# Patient Record
Sex: Male | Born: 1940
Health system: Southern US, Community
[De-identification: ages and names within clinical notes are randomized; demographics above are authoritative.]

## PROBLEM LIST (undated history)

## (undated) DIAGNOSIS — I219 Acute myocardial infarction, unspecified: Secondary | ICD-10-CM

## (undated) DIAGNOSIS — K219 Gastro-esophageal reflux disease without esophagitis: Secondary | ICD-10-CM

## (undated) DIAGNOSIS — G4730 Sleep apnea, unspecified: Secondary | ICD-10-CM

## (undated) DIAGNOSIS — I639 Cerebral infarction, unspecified: Secondary | ICD-10-CM

## (undated) DIAGNOSIS — C49A0 Gastrointestinal stromal tumor, unspecified site: Secondary | ICD-10-CM

## (undated) DIAGNOSIS — G4733 Obstructive sleep apnea (adult) (pediatric): Secondary | ICD-10-CM

## (undated) DIAGNOSIS — N400 Enlarged prostate without lower urinary tract symptoms: Secondary | ICD-10-CM

## (undated) DIAGNOSIS — I10 Essential (primary) hypertension: Secondary | ICD-10-CM

## (undated) DIAGNOSIS — M1990 Unspecified osteoarthritis, unspecified site: Secondary | ICD-10-CM

## (undated) DIAGNOSIS — R0902 Hypoxemia: Secondary | ICD-10-CM

## (undated) DIAGNOSIS — C61 Malignant neoplasm of prostate: Secondary | ICD-10-CM

## (undated) DIAGNOSIS — IMO0002 Reserved for concepts with insufficient information to code with codable children: Secondary | ICD-10-CM

## (undated) DIAGNOSIS — H409 Unspecified glaucoma: Secondary | ICD-10-CM

## (undated) DIAGNOSIS — N289 Disorder of kidney and ureter, unspecified: Secondary | ICD-10-CM

## (undated) DIAGNOSIS — H269 Unspecified cataract: Secondary | ICD-10-CM

## (undated) DIAGNOSIS — D649 Anemia, unspecified: Secondary | ICD-10-CM

## (undated) DIAGNOSIS — I2510 Atherosclerotic heart disease of native coronary artery without angina pectoris: Secondary | ICD-10-CM

## (undated) DIAGNOSIS — R011 Cardiac murmur, unspecified: Secondary | ICD-10-CM

## (undated) DIAGNOSIS — T7840XA Allergy, unspecified, initial encounter: Secondary | ICD-10-CM

## (undated) HISTORY — DX: Cardiac murmur, unspecified: R01.1

## (undated) HISTORY — PX: KNEE SURGERY: SHX244

## (undated) HISTORY — DX: Gastro-esophageal reflux disease without esophagitis: K21.9

## (undated) HISTORY — DX: Allergy, unspecified, initial encounter: T78.40XA

## (undated) HISTORY — DX: Obstructive sleep apnea (adult) (pediatric): G47.33

## (undated) HISTORY — DX: Anemia, unspecified: D64.9

## (undated) HISTORY — DX: Reserved for concepts with insufficient information to code with codable children: IMO0002

## (undated) HISTORY — DX: Sleep apnea, unspecified: G47.30

## (undated) HISTORY — DX: Cerebral infarction, unspecified: I63.9

## (undated) HISTORY — DX: Unspecified cataract: H26.9

## (undated) HISTORY — PX: COLON SURGERY: SHX602

## (undated) HISTORY — DX: Hypoxemia: R09.02

## (undated) HISTORY — PX: EYE SURGERY: SHX253

## (undated) HISTORY — PX: HERNIA REPAIR: SHX51

## (undated) HISTORY — DX: Acute myocardial infarction, unspecified: I21.9

## (undated) HISTORY — PX: ABDOMINAL SURGERY: SHX537

## (undated) HISTORY — DX: Unspecified osteoarthritis, unspecified site: M19.90

---

## 1999-10-02 ENCOUNTER — Other Ambulatory Visit: Admission: RE | Admit: 1999-10-02 | Discharge: 1999-10-02 | Payer: Self-pay | Admitting: Urology

## 1999-10-16 ENCOUNTER — Ambulatory Visit (HOSPITAL_BASED_OUTPATIENT_CLINIC_OR_DEPARTMENT_OTHER): Admission: RE | Admit: 1999-10-16 | Discharge: 1999-10-16 | Payer: Self-pay | Admitting: *Deleted

## 2001-11-08 ENCOUNTER — Ambulatory Visit (HOSPITAL_COMMUNITY): Admission: RE | Admit: 2001-11-08 | Discharge: 2001-11-08 | Payer: Self-pay | Admitting: Cardiology

## 2002-01-07 ENCOUNTER — Inpatient Hospital Stay (HOSPITAL_COMMUNITY): Admission: EM | Admit: 2002-01-07 | Discharge: 2002-01-13 | Payer: Self-pay | Admitting: Emergency Medicine

## 2002-01-07 ENCOUNTER — Encounter: Payer: Self-pay | Admitting: Neurology

## 2002-01-08 ENCOUNTER — Encounter: Payer: Self-pay | Admitting: Neurology

## 2002-01-09 ENCOUNTER — Encounter: Payer: Self-pay | Admitting: Neurology

## 2002-01-10 ENCOUNTER — Encounter: Payer: Self-pay | Admitting: Neurology

## 2002-01-17 ENCOUNTER — Encounter: Admission: RE | Admit: 2002-01-17 | Discharge: 2002-03-22 | Payer: Self-pay | Admitting: Neurology

## 2002-02-01 ENCOUNTER — Encounter: Payer: Self-pay | Admitting: Cardiology

## 2002-02-01 ENCOUNTER — Ambulatory Visit (HOSPITAL_COMMUNITY): Admission: RE | Admit: 2002-02-01 | Discharge: 2002-02-01 | Payer: Self-pay | Admitting: Cardiology

## 2002-02-08 ENCOUNTER — Encounter: Payer: Self-pay | Admitting: Neurology

## 2002-02-08 ENCOUNTER — Ambulatory Visit (HOSPITAL_COMMUNITY): Admission: RE | Admit: 2002-02-08 | Discharge: 2002-02-08 | Payer: Self-pay | Admitting: Neurology

## 2002-03-28 ENCOUNTER — Ambulatory Visit (HOSPITAL_COMMUNITY): Admission: RE | Admit: 2002-03-28 | Discharge: 2002-03-28 | Payer: Self-pay | Admitting: Neurology

## 2002-03-28 ENCOUNTER — Encounter: Payer: Self-pay | Admitting: Neurology

## 2003-03-11 ENCOUNTER — Encounter: Payer: Self-pay | Admitting: Emergency Medicine

## 2003-03-11 ENCOUNTER — Emergency Department (HOSPITAL_COMMUNITY): Admission: EM | Admit: 2003-03-11 | Discharge: 2003-03-12 | Payer: Self-pay

## 2003-03-30 ENCOUNTER — Ambulatory Visit (HOSPITAL_COMMUNITY): Admission: RE | Admit: 2003-03-30 | Discharge: 2003-03-30 | Payer: Self-pay | Admitting: Internal Medicine

## 2005-02-09 ENCOUNTER — Ambulatory Visit (HOSPITAL_COMMUNITY): Admission: RE | Admit: 2005-02-09 | Discharge: 2005-02-09 | Payer: Self-pay | Admitting: Surgery

## 2005-02-09 ENCOUNTER — Ambulatory Visit (HOSPITAL_BASED_OUTPATIENT_CLINIC_OR_DEPARTMENT_OTHER): Admission: RE | Admit: 2005-02-09 | Discharge: 2005-02-09 | Payer: Self-pay | Admitting: Surgery

## 2005-12-04 ENCOUNTER — Encounter: Admission: RE | Admit: 2005-12-04 | Discharge: 2005-12-04 | Payer: Self-pay | Admitting: Internal Medicine

## 2006-08-29 ENCOUNTER — Emergency Department (HOSPITAL_COMMUNITY): Admission: EM | Admit: 2006-08-29 | Discharge: 2006-08-29 | Payer: Self-pay | Admitting: Emergency Medicine

## 2006-09-01 ENCOUNTER — Emergency Department (HOSPITAL_COMMUNITY): Admission: EM | Admit: 2006-09-01 | Discharge: 2006-09-01 | Payer: Self-pay | Admitting: Family Medicine

## 2007-04-04 ENCOUNTER — Encounter: Admission: RE | Admit: 2007-04-04 | Discharge: 2007-04-04 | Payer: Self-pay | Admitting: Surgery

## 2007-05-16 ENCOUNTER — Encounter (INDEPENDENT_AMBULATORY_CARE_PROVIDER_SITE_OTHER): Payer: Self-pay | Admitting: Surgery

## 2007-05-16 ENCOUNTER — Inpatient Hospital Stay (HOSPITAL_COMMUNITY): Admission: RE | Admit: 2007-05-16 | Discharge: 2007-05-21 | Payer: Self-pay | Admitting: Surgery

## 2007-05-30 ENCOUNTER — Ambulatory Visit: Payer: Self-pay | Admitting: Oncology

## 2007-06-03 ENCOUNTER — Encounter: Admission: RE | Admit: 2007-06-03 | Discharge: 2007-06-03 | Payer: Self-pay | Admitting: Family Medicine

## 2007-06-20 LAB — COMPREHENSIVE METABOLIC PANEL
ALT: 10 U/L (ref 0–53)
AST: 13 U/L (ref 0–37)
Albumin: 4.4 g/dL (ref 3.5–5.2)
BUN: 38 mg/dL — ABNORMAL HIGH (ref 6–23)
CO2: 23 mEq/L (ref 19–32)
Calcium: 9.6 mg/dL (ref 8.4–10.5)
Chloride: 104 mEq/L (ref 96–112)
Potassium: 4.8 mEq/L (ref 3.5–5.3)

## 2007-06-20 LAB — CBC WITH DIFFERENTIAL/PLATELET
BASO%: 0.8 % (ref 0.0–2.0)
Basophils Absolute: 0.1 10*3/uL (ref 0.0–0.1)
EOS%: 1.7 % (ref 0.0–7.0)
HCT: 30.1 % — ABNORMAL LOW (ref 38.7–49.9)
HGB: 9.9 g/dL — ABNORMAL LOW (ref 13.0–17.1)
MCH: 27.4 pg — ABNORMAL LOW (ref 28.0–33.4)
MONO#: 0.3 10*3/uL (ref 0.1–0.9)
RDW: 14.8 % — ABNORMAL HIGH (ref 11.2–14.6)
WBC: 6.2 10*3/uL (ref 4.0–10.0)
lymph#: 1.1 10*3/uL (ref 0.9–3.3)

## 2007-06-20 LAB — LACTATE DEHYDROGENASE: LDH: 153 U/L (ref 94–250)

## 2007-06-24 ENCOUNTER — Ambulatory Visit (HOSPITAL_COMMUNITY): Admission: RE | Admit: 2007-06-24 | Discharge: 2007-06-24 | Payer: Self-pay | Admitting: Oncology

## 2007-07-14 ENCOUNTER — Ambulatory Visit: Payer: Self-pay | Admitting: Oncology

## 2007-07-18 LAB — CBC WITH DIFFERENTIAL/PLATELET
Basophils Absolute: 0 10*3/uL (ref 0.0–0.1)
Eosinophils Absolute: 0.1 10*3/uL (ref 0.0–0.5)
HCT: 30.6 % — ABNORMAL LOW (ref 38.7–49.9)
HGB: 10.4 g/dL — ABNORMAL LOW (ref 13.0–17.1)
MCH: 28.1 pg (ref 28.0–33.4)
MCV: 82.6 fL (ref 81.6–98.0)
MONO%: 5.3 % (ref 0.0–13.0)
NEUT#: 4.4 10*3/uL (ref 1.5–6.5)
NEUT%: 68.3 % (ref 40.0–75.0)
RDW: 14.3 % (ref 11.2–14.6)

## 2007-07-18 LAB — COMPREHENSIVE METABOLIC PANEL
Albumin: 4.1 g/dL (ref 3.5–5.2)
Alkaline Phosphatase: 53 U/L (ref 39–117)
BUN: 24 mg/dL — ABNORMAL HIGH (ref 6–23)
Calcium: 9.2 mg/dL (ref 8.4–10.5)
Chloride: 103 mEq/L (ref 96–112)
Creatinine, Ser: 1.86 mg/dL — ABNORMAL HIGH (ref 0.40–1.50)
Glucose, Bld: 98 mg/dL (ref 70–99)
Potassium: 4.3 mEq/L (ref 3.5–5.3)

## 2007-07-21 ENCOUNTER — Ambulatory Visit (HOSPITAL_COMMUNITY): Admission: RE | Admit: 2007-07-21 | Discharge: 2007-07-21 | Payer: Self-pay | Admitting: Oncology

## 2007-08-15 LAB — CBC WITH DIFFERENTIAL/PLATELET
Basophils Absolute: 0 10*3/uL (ref 0.0–0.1)
EOS%: 1.4 % (ref 0.0–7.0)
Eosinophils Absolute: 0.1 10*3/uL (ref 0.0–0.5)
HGB: 10.8 g/dL — ABNORMAL LOW (ref 13.0–17.1)
LYMPH%: 18.8 % (ref 14.0–48.0)
MCH: 28 pg (ref 28.0–33.4)
MCV: 82.6 fL (ref 81.6–98.0)
MONO%: 6.5 % (ref 0.0–13.0)
NEUT#: 3.3 10*3/uL (ref 1.5–6.5)
NEUT%: 73.2 % (ref 40.0–75.0)
Platelets: 186 10*3/uL (ref 145–400)
RDW: 15.4 % — ABNORMAL HIGH (ref 11.2–14.6)

## 2007-08-15 LAB — COMPREHENSIVE METABOLIC PANEL
AST: 26 U/L (ref 0–37)
Albumin: 4.4 g/dL (ref 3.5–5.2)
Alkaline Phosphatase: 54 U/L (ref 39–117)
BUN: 19 mg/dL (ref 6–23)
Creatinine, Ser: 1.79 mg/dL — ABNORMAL HIGH (ref 0.40–1.50)
Glucose, Bld: 105 mg/dL — ABNORMAL HIGH (ref 70–99)
Potassium: 3.8 mEq/L (ref 3.5–5.3)
Total Bilirubin: 0.6 mg/dL (ref 0.3–1.2)

## 2007-09-08 ENCOUNTER — Ambulatory Visit: Payer: Self-pay | Admitting: Oncology

## 2007-09-12 LAB — COMPREHENSIVE METABOLIC PANEL
AST: 19 U/L (ref 0–37)
Albumin: 4.3 g/dL (ref 3.5–5.2)
Alkaline Phosphatase: 45 U/L (ref 39–117)
BUN: 29 mg/dL — ABNORMAL HIGH (ref 6–23)
Glucose, Bld: 120 mg/dL — ABNORMAL HIGH (ref 70–99)
Potassium: 3.7 mEq/L (ref 3.5–5.3)
Sodium: 140 mEq/L (ref 135–145)
Total Bilirubin: 0.7 mg/dL (ref 0.3–1.2)

## 2007-09-12 LAB — CBC WITH DIFFERENTIAL/PLATELET
BASO%: 0.7 % (ref 0.0–2.0)
EOS%: 3.9 % (ref 0.0–7.0)
MCH: 27.8 pg — ABNORMAL LOW (ref 28.0–33.4)
MCV: 81.6 fL (ref 81.6–98.0)
MONO%: 6.3 % (ref 0.0–13.0)
RBC: 3.82 10*6/uL — ABNORMAL LOW (ref 4.20–5.71)
RDW: 15.8 % — ABNORMAL HIGH (ref 11.2–14.6)
lymph#: 1.1 10*3/uL (ref 0.9–3.3)

## 2007-11-03 ENCOUNTER — Ambulatory Visit: Payer: Self-pay | Admitting: Oncology

## 2007-11-08 ENCOUNTER — Ambulatory Visit (HOSPITAL_COMMUNITY): Admission: RE | Admit: 2007-11-08 | Discharge: 2007-11-08 | Payer: Self-pay | Admitting: Oncology

## 2007-11-10 LAB — COMPREHENSIVE METABOLIC PANEL
AST: 21 U/L (ref 0–37)
Albumin: 4.2 g/dL (ref 3.5–5.2)
Alkaline Phosphatase: 45 U/L (ref 39–117)
BUN: 16 mg/dL (ref 6–23)
Calcium: 9.5 mg/dL (ref 8.4–10.5)
Chloride: 104 mEq/L (ref 96–112)
Glucose, Bld: 91 mg/dL (ref 70–99)
Potassium: 4.3 mEq/L (ref 3.5–5.3)
Sodium: 140 mEq/L (ref 135–145)
Total Protein: 7.3 g/dL (ref 6.0–8.3)

## 2007-11-10 LAB — CBC WITH DIFFERENTIAL/PLATELET
Basophils Absolute: 0 10*3/uL (ref 0.0–0.1)
EOS%: 1.3 % (ref 0.0–7.0)
Eosinophils Absolute: 0.1 10*3/uL (ref 0.0–0.5)
HGB: 10.4 g/dL — ABNORMAL LOW (ref 13.0–17.1)
MCV: 86.1 fL (ref 81.6–98.0)
MONO%: 6.7 % (ref 0.0–13.0)
NEUT#: 2.8 10*3/uL (ref 1.5–6.5)
RBC: 3.57 10*6/uL — ABNORMAL LOW (ref 4.20–5.71)
RDW: 14.5 % (ref 11.2–14.6)
lymph#: 1.1 10*3/uL (ref 0.9–3.3)

## 2007-12-08 LAB — CBC WITH DIFFERENTIAL/PLATELET
Basophils Absolute: 0 10*3/uL (ref 0.0–0.1)
Eosinophils Absolute: 0.2 10*3/uL (ref 0.0–0.5)
HGB: 10.7 g/dL — ABNORMAL LOW (ref 13.0–17.1)
MCV: 85 fL (ref 81.6–98.0)
MONO#: 0.3 10*3/uL (ref 0.1–0.9)
MONO%: 6.4 % (ref 0.0–13.0)
NEUT#: 3.1 10*3/uL (ref 1.5–6.5)
RBC: 3.76 10*6/uL — ABNORMAL LOW (ref 4.20–5.71)
RDW: 13.6 % (ref 11.2–14.6)
WBC: 4.7 10*3/uL (ref 4.0–10.0)
lymph#: 1.1 10*3/uL (ref 0.9–3.3)

## 2007-12-08 LAB — COMPREHENSIVE METABOLIC PANEL
Albumin: 4 g/dL (ref 3.5–5.2)
BUN: 22 mg/dL (ref 6–23)
CO2: 29 mEq/L (ref 19–32)
Calcium: 8.7 mg/dL (ref 8.4–10.5)
Chloride: 105 mEq/L (ref 96–112)
Glucose, Bld: 95 mg/dL (ref 70–99)
Potassium: 4.1 mEq/L (ref 3.5–5.3)
Sodium: 141 mEq/L (ref 135–145)
Total Protein: 6.8 g/dL (ref 6.0–8.3)

## 2008-01-03 ENCOUNTER — Ambulatory Visit: Payer: Self-pay | Admitting: Oncology

## 2008-02-02 LAB — CBC WITH DIFFERENTIAL/PLATELET
Basophils Absolute: 0 10*3/uL (ref 0.0–0.1)
Eosinophils Absolute: 0.2 10*3/uL (ref 0.0–0.5)
HGB: 12.1 g/dL — ABNORMAL LOW (ref 13.0–17.1)
LYMPH%: 25.8 % (ref 14.0–48.0)
MCV: 83.9 fL (ref 81.6–98.0)
MONO#: 0.4 10*3/uL (ref 0.1–0.9)
MONO%: 7.2 % (ref 0.0–13.0)
Platelets: 165 10*3/uL (ref 145–400)
RDW: 13.7 % (ref 11.2–14.6)
WBC: 5.1 10*3/uL (ref 4.0–10.0)

## 2008-02-02 LAB — COMPREHENSIVE METABOLIC PANEL
Albumin: 4.2 g/dL (ref 3.5–5.2)
Alkaline Phosphatase: 46 U/L (ref 39–117)
BUN: 20 mg/dL (ref 6–23)
CO2: 29 mEq/L (ref 19–32)
Glucose, Bld: 94 mg/dL (ref 70–99)
Potassium: 3.7 mEq/L (ref 3.5–5.3)

## 2008-03-06 ENCOUNTER — Ambulatory Visit: Payer: Self-pay | Admitting: Oncology

## 2008-03-08 ENCOUNTER — Ambulatory Visit (HOSPITAL_COMMUNITY): Admission: RE | Admit: 2008-03-08 | Discharge: 2008-03-08 | Payer: Self-pay | Admitting: Oncology

## 2008-03-08 LAB — CBC WITH DIFFERENTIAL/PLATELET
BASO%: 0.3 % (ref 0.0–2.0)
EOS%: 3 % (ref 0.0–7.0)
HCT: 36.5 % — ABNORMAL LOW (ref 38.7–49.9)
MCH: 27.6 pg — ABNORMAL LOW (ref 28.0–33.4)
MCHC: 33.5 g/dL (ref 32.0–35.9)
MONO#: 0.4 10*3/uL (ref 0.1–0.9)
RBC: 4.42 10*6/uL (ref 4.20–5.71)
RDW: 15 % — ABNORMAL HIGH (ref 11.2–14.6)
WBC: 4.8 10*3/uL (ref 4.0–10.0)
lymph#: 1.3 10*3/uL (ref 0.9–3.3)

## 2008-03-08 LAB — COMPREHENSIVE METABOLIC PANEL
ALT: 15 U/L (ref 0–53)
AST: 24 U/L (ref 0–37)
Albumin: 4.6 g/dL (ref 3.5–5.2)
CO2: 27 mEq/L (ref 19–32)
Calcium: 9.6 mg/dL (ref 8.4–10.5)
Chloride: 103 mEq/L (ref 96–112)
Creatinine, Ser: 1.58 mg/dL — ABNORMAL HIGH (ref 0.40–1.50)
Potassium: 4 mEq/L (ref 3.5–5.3)
Sodium: 140 mEq/L (ref 135–145)
Total Protein: 7.7 g/dL (ref 6.0–8.3)

## 2008-04-04 ENCOUNTER — Encounter: Payer: Self-pay | Admitting: Cardiovascular Disease

## 2008-04-06 ENCOUNTER — Encounter: Payer: Self-pay | Admitting: Cardiovascular Disease

## 2008-04-13 LAB — COMPREHENSIVE METABOLIC PANEL
ALT: 13 U/L (ref 0–53)
CO2: 28 mEq/L (ref 19–32)
Calcium: 9.9 mg/dL (ref 8.4–10.5)
Chloride: 103 mEq/L (ref 96–112)
Creatinine, Ser: 1.69 mg/dL — ABNORMAL HIGH (ref 0.40–1.50)
Glucose, Bld: 99 mg/dL (ref 70–99)
Sodium: 139 mEq/L (ref 135–145)
Total Bilirubin: 0.5 mg/dL (ref 0.3–1.2)
Total Protein: 7 g/dL (ref 6.0–8.3)

## 2008-04-13 LAB — LACTATE DEHYDROGENASE: LDH: 190 U/L (ref 94–250)

## 2008-04-13 LAB — CBC WITH DIFFERENTIAL/PLATELET
BASO%: 0.4 % (ref 0.0–2.0)
Eosinophils Absolute: 0.1 10*3/uL (ref 0.0–0.5)
HCT: 33.2 % — ABNORMAL LOW (ref 38.7–49.9)
LYMPH%: 16.1 % (ref 14.0–48.0)
MCHC: 33.8 g/dL (ref 32.0–35.9)
MONO#: 0.4 10*3/uL (ref 0.1–0.9)
NEUT#: 5.1 10*3/uL (ref 1.5–6.5)
NEUT%: 74.9 % (ref 40.0–75.0)
Platelets: 144 10*3/uL — ABNORMAL LOW (ref 145–400)
WBC: 6.8 10*3/uL (ref 4.0–10.0)
lymph#: 1.1 10*3/uL (ref 0.9–3.3)

## 2008-05-10 ENCOUNTER — Ambulatory Visit: Payer: Self-pay | Admitting: Oncology

## 2008-05-14 LAB — COMPREHENSIVE METABOLIC PANEL
Alkaline Phosphatase: 43 U/L (ref 39–117)
BUN: 21 mg/dL (ref 6–23)
CO2: 28 mEq/L (ref 19–32)
Creatinine, Ser: 1.59 mg/dL — ABNORMAL HIGH (ref 0.40–1.50)
Glucose, Bld: 90 mg/dL (ref 70–99)
Total Bilirubin: 0.7 mg/dL (ref 0.3–1.2)

## 2008-05-14 LAB — CBC WITH DIFFERENTIAL/PLATELET
Basophils Absolute: 0 10*3/uL (ref 0.0–0.1)
Eosinophils Absolute: 0.2 10*3/uL (ref 0.0–0.5)
HCT: 34 % — ABNORMAL LOW (ref 38.7–49.9)
HGB: 11.4 g/dL — ABNORMAL LOW (ref 13.0–17.1)
LYMPH%: 23.2 % (ref 14.0–48.0)
MCV: 84 fL (ref 81.6–98.0)
MONO#: 0.4 10*3/uL (ref 0.1–0.9)
MONO%: 6 % (ref 0.0–13.0)
NEUT#: 4 10*3/uL (ref 1.5–6.5)
NEUT%: 67.9 % (ref 40.0–75.0)
Platelets: 150 10*3/uL (ref 145–400)
RBC: 4.05 10*6/uL — ABNORMAL LOW (ref 4.20–5.71)

## 2008-05-14 LAB — LACTATE DEHYDROGENASE: LDH: 207 U/L (ref 94–250)

## 2008-06-29 ENCOUNTER — Ambulatory Visit: Payer: Self-pay | Admitting: Oncology

## 2008-07-03 LAB — COMPREHENSIVE METABOLIC PANEL
AST: 30 U/L (ref 0–37)
Albumin: 3.7 g/dL (ref 3.5–5.2)
Alkaline Phosphatase: 41 U/L (ref 39–117)
BUN: 19 mg/dL (ref 6–23)
Potassium: 3.6 mEq/L (ref 3.5–5.3)
Sodium: 139 mEq/L (ref 135–145)
Total Bilirubin: 0.4 mg/dL (ref 0.3–1.2)

## 2008-07-03 LAB — CBC WITH DIFFERENTIAL/PLATELET
Basophils Absolute: 0 10*3/uL (ref 0.0–0.1)
EOS%: 2.8 % (ref 0.0–7.0)
LYMPH%: 23.7 % (ref 14.0–48.0)
MCH: 28 pg (ref 28.0–33.4)
MCV: 85.2 fL (ref 81.6–98.0)
MONO%: 6 % (ref 0.0–13.0)
RBC: 4.09 10*6/uL — ABNORMAL LOW (ref 4.20–5.71)
RDW: 15 % — ABNORMAL HIGH (ref 11.2–14.6)

## 2008-07-04 ENCOUNTER — Ambulatory Visit (HOSPITAL_COMMUNITY): Admission: RE | Admit: 2008-07-04 | Discharge: 2008-07-04 | Payer: Self-pay | Admitting: Oncology

## 2008-07-27 ENCOUNTER — Inpatient Hospital Stay (HOSPITAL_BASED_OUTPATIENT_CLINIC_OR_DEPARTMENT_OTHER): Admission: RE | Admit: 2008-07-27 | Discharge: 2008-07-27 | Payer: Self-pay | Admitting: Cardiovascular Disease

## 2008-12-28 ENCOUNTER — Ambulatory Visit: Payer: Self-pay | Admitting: Oncology

## 2009-01-01 ENCOUNTER — Ambulatory Visit (HOSPITAL_COMMUNITY): Admission: RE | Admit: 2009-01-01 | Discharge: 2009-01-01 | Payer: Self-pay | Admitting: Oncology

## 2009-01-01 LAB — COMPREHENSIVE METABOLIC PANEL
ALT: 20 U/L (ref 0–53)
AST: 28 U/L (ref 0–37)
Albumin: 4 g/dL (ref 3.5–5.2)
Calcium: 9.3 mg/dL (ref 8.4–10.5)
Chloride: 103 mEq/L (ref 96–112)
Potassium: 4.2 mEq/L (ref 3.5–5.3)
Sodium: 136 mEq/L (ref 135–145)

## 2009-01-01 LAB — CBC WITH DIFFERENTIAL/PLATELET
BASO%: 0.4 % (ref 0.0–2.0)
EOS%: 1.4 % (ref 0.0–7.0)
HGB: 12.2 g/dL — ABNORMAL LOW (ref 13.0–17.1)
MCH: 27.6 pg (ref 27.2–33.4)
MCHC: 33.3 g/dL (ref 32.0–36.0)
RDW: 14.6 % (ref 11.0–14.6)
lymph#: 1.3 10*3/uL (ref 0.9–3.3)

## 2009-06-28 ENCOUNTER — Ambulatory Visit: Payer: Self-pay | Admitting: Oncology

## 2009-07-02 ENCOUNTER — Ambulatory Visit (HOSPITAL_COMMUNITY): Admission: RE | Admit: 2009-07-02 | Discharge: 2009-07-02 | Payer: Self-pay | Admitting: Oncology

## 2009-07-02 LAB — CBC WITH DIFFERENTIAL/PLATELET
BASO%: 0.5 % (ref 0.0–2.0)
Basophils Absolute: 0 10*3/uL (ref 0.0–0.1)
EOS%: 5 % (ref 0.0–7.0)
HCT: 38.7 % (ref 38.4–49.9)
HGB: 12.8 g/dL — ABNORMAL LOW (ref 13.0–17.1)
LYMPH%: 25.7 % (ref 14.0–49.0)
MCH: 28.1 pg (ref 27.2–33.4)
MCHC: 33 g/dL (ref 32.0–36.0)
NEUT%: 61.9 % (ref 39.0–75.0)
Platelets: 152 10*3/uL (ref 140–400)

## 2009-07-02 LAB — COMPREHENSIVE METABOLIC PANEL
ALT: 22 U/L (ref 0–53)
AST: 32 U/L (ref 0–37)
BUN: 25 mg/dL — ABNORMAL HIGH (ref 6–23)
CO2: 30 mEq/L (ref 19–32)
Calcium: 10 mg/dL (ref 8.4–10.5)
Chloride: 101 mEq/L (ref 96–112)
Creatinine, Ser: 1.52 mg/dL — ABNORMAL HIGH (ref 0.40–1.50)
Total Bilirubin: 0.6 mg/dL (ref 0.3–1.2)

## 2009-07-02 LAB — LACTATE DEHYDROGENASE: LDH: 174 U/L (ref 94–250)

## 2009-07-03 ENCOUNTER — Ambulatory Visit (HOSPITAL_COMMUNITY): Admission: RE | Admit: 2009-07-03 | Discharge: 2009-07-03 | Payer: Self-pay | Admitting: Oncology

## 2009-10-14 IMAGING — RF DG UGI W/ HIGH DENSITY W/KUB
18 series · 18 of 18 positions shown · non-contrast
Comparison: none

CLINICAL DATA: Gastric mass

UPPER GI SERIES W/ HIGH-DENSITY BARIUM AND KUB:
TECHNIQUE: After obtaining a scout radiograph, a double-contrast upper GI
series was performed using both high-density and thin barium.

[Series 1: run · 1 of 1 slices shown (1 of 18)]
[im 1/1]
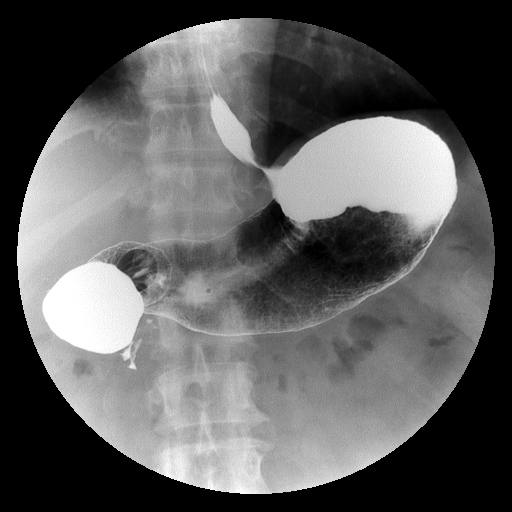

[Series 2: run · 1 of 1 slices shown (2 of 18)]
[im 1/1]
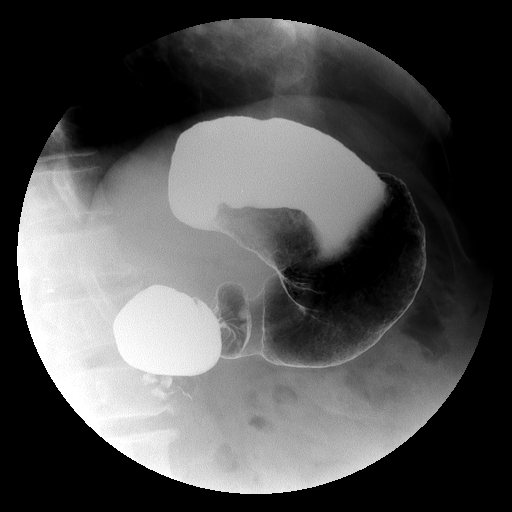

[Series 3: run · 1 of 1 slices shown (3 of 18)]
[im 1/1]
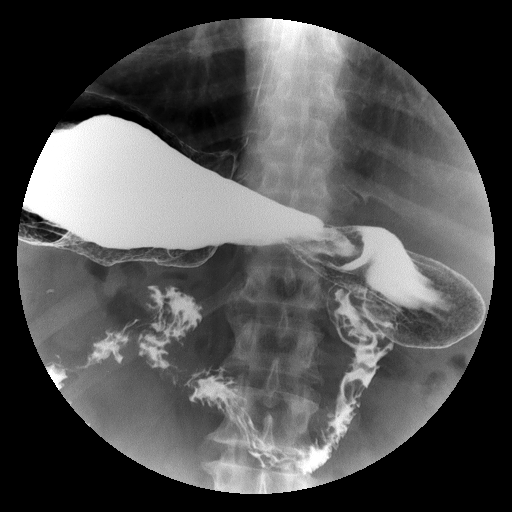

[Series 4: run · 1 of 1 slices shown (4 of 18)]
[im 1/1]
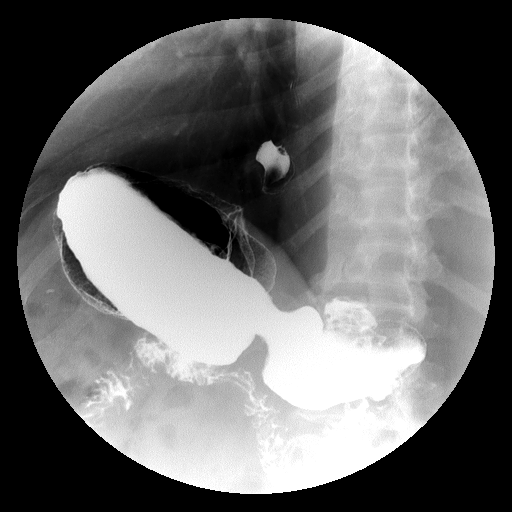

[Series 5: run · 1 of 1 slices shown (5 of 18)]
[im 1/1]
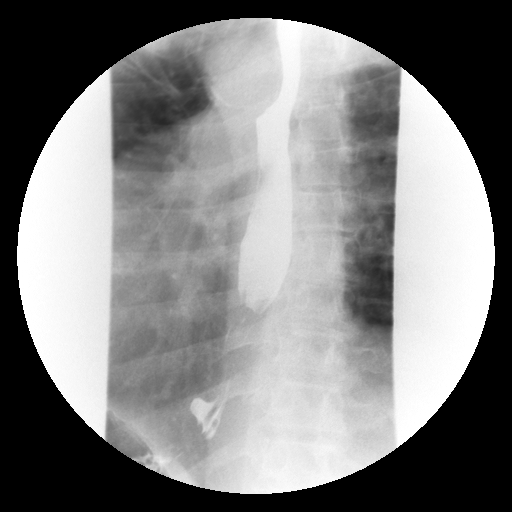

[Series 6: run · 1 of 1 slices shown (6 of 18)]
[im 1/1]
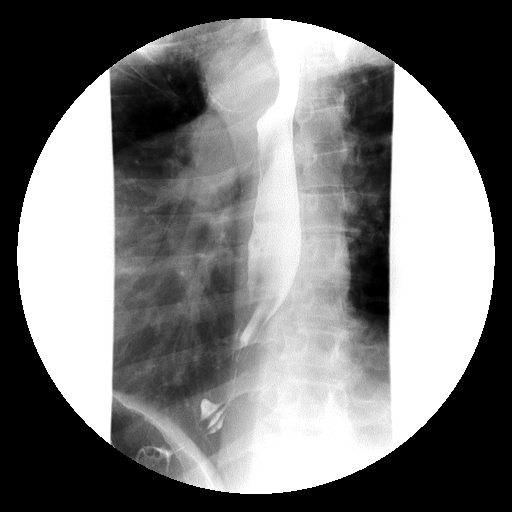

[Series 7: run · 1 of 1 slices shown (7 of 18)]
[im 1/1]
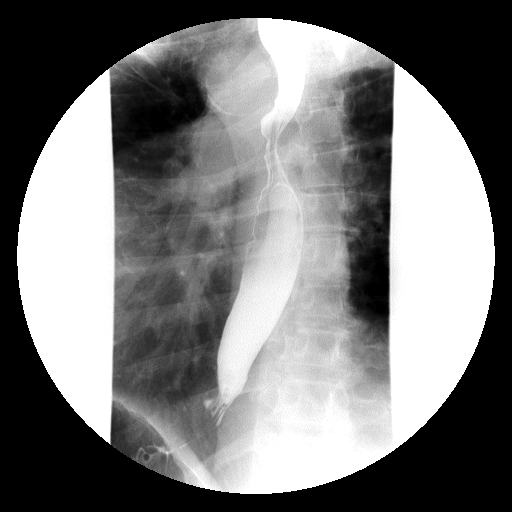

[Series 8: run · 1 of 1 slices shown (8 of 18)]
[im 1/1]
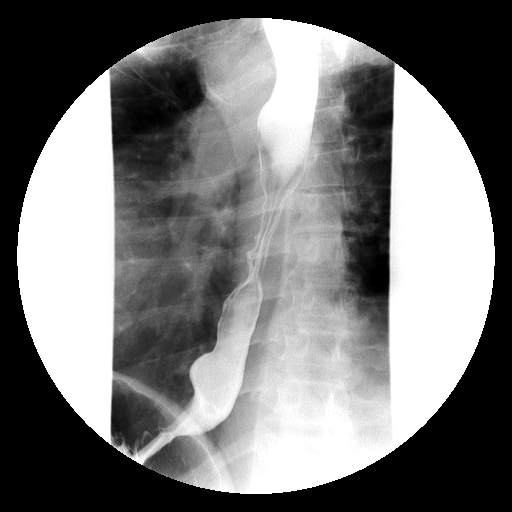

[Series 9: run · 1 of 1 slices shown (9 of 18)]
[im 1/1]
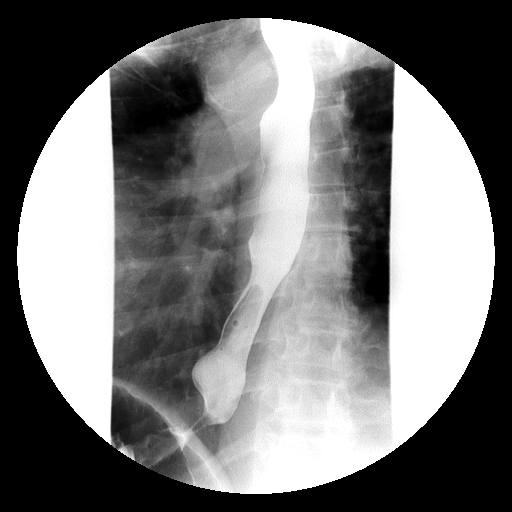

[Series 10: run · 1 of 1 slices shown (10 of 18)]
[im 1/1]
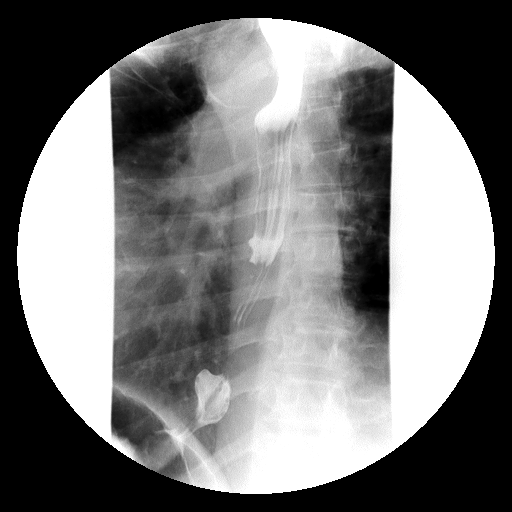

[Series 11: run · 1 of 1 slices shown (11 of 18)]
[im 1/1]
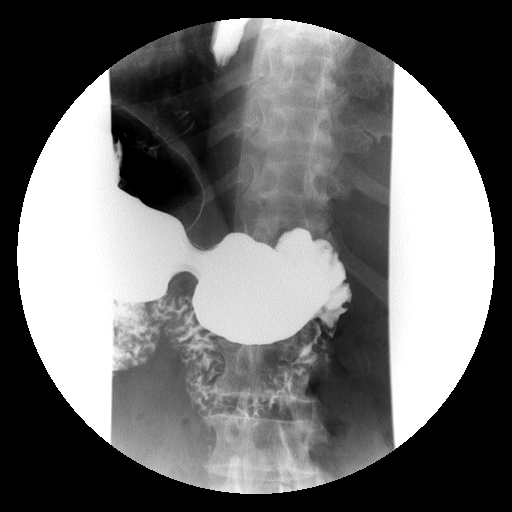

[Series 12: run · 1 of 1 slices shown (12 of 18)]
[im 1/1]
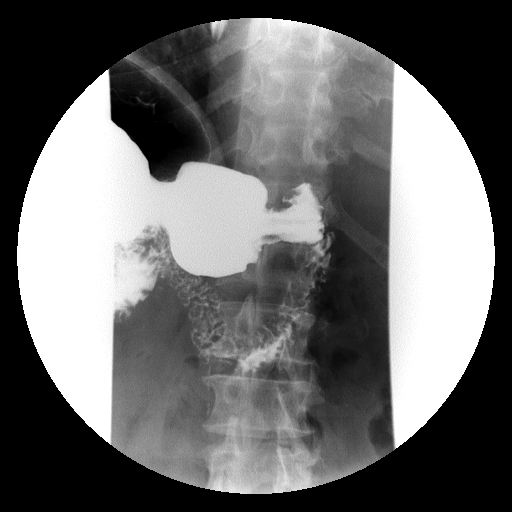

[Series 13: run · 1 of 1 slices shown (13 of 18)]
[im 1/1]
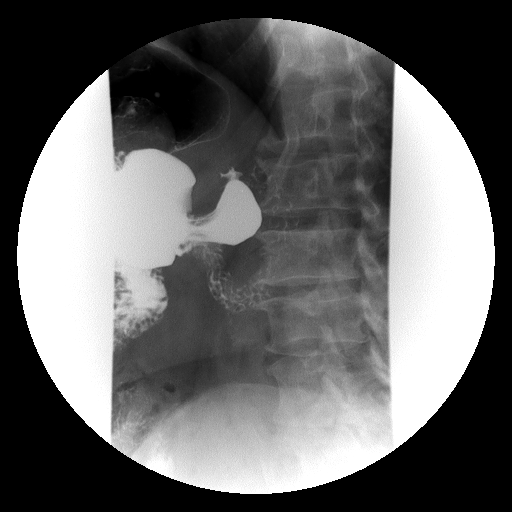

[Series 14: run · 1 of 1 slices shown (14 of 18)]
[im 1/1]
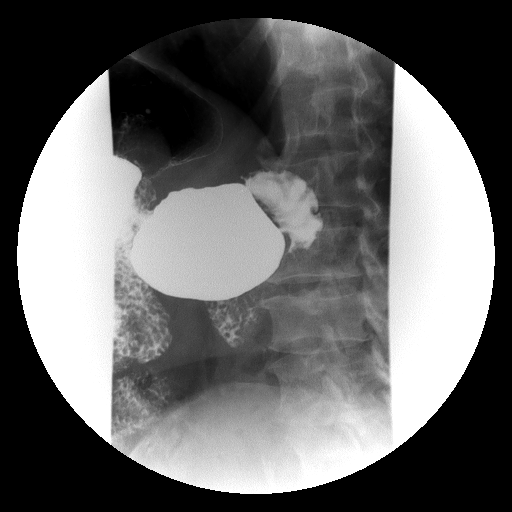

[Series 15: run · 1 of 1 slices shown (15 of 18)]
[im 1/1]
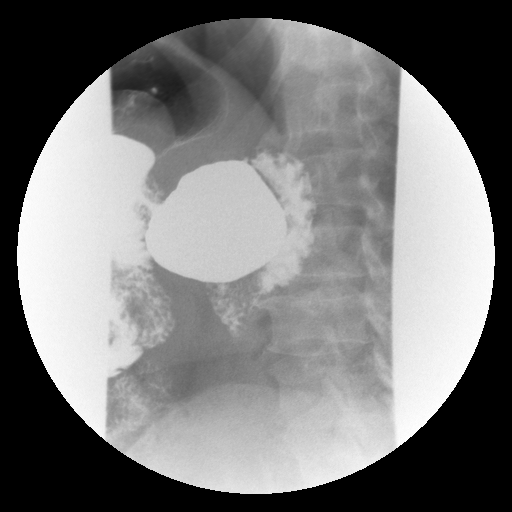

[Series 16: run · 1 of 1 slices shown (16 of 18)]
[im 1/1]
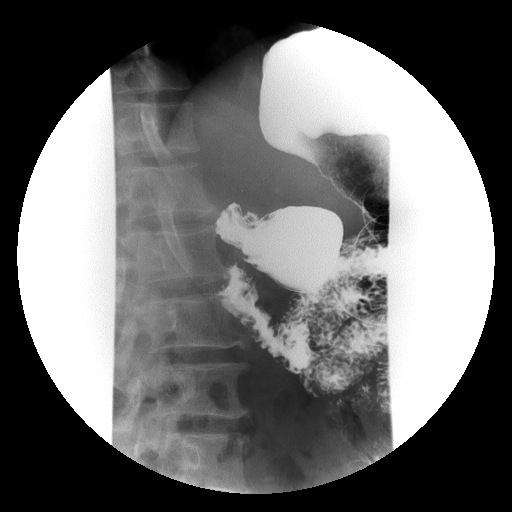

[Series 17: run · 1 of 1 slices shown (17 of 18)]
[im 1/1]
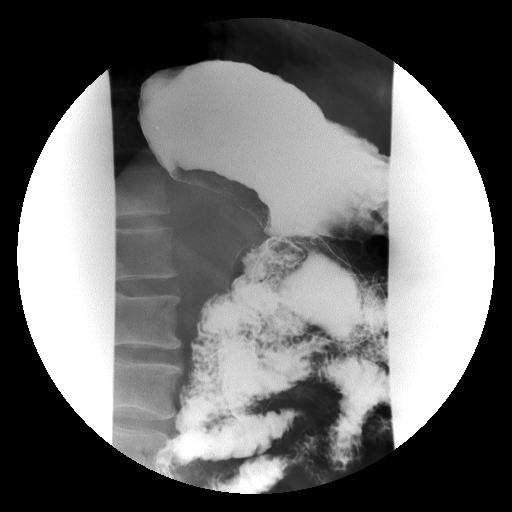

[Series 18: run · 1 of 1 slices shown (18 of 18)]
[im 1/1]
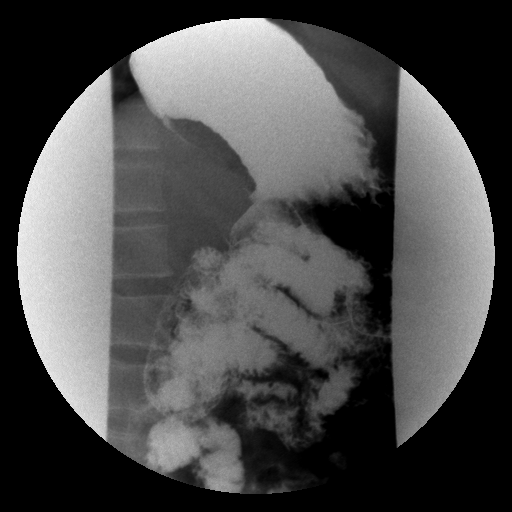

[18 of 18 positions shown; findings below may reference images not displayed]

FINDINGS: Fluoroscopic evaluation of esophageal function is normal. There is
normal peristalsis. No esophageal mass, stricture, or fold thickening.

There is a sessile filling defect noted within the fundus of the stomach, likely
corresponding to large mass seen on recent endoscopy. No additional abnormality
seen within the stomach. Duodenal bulb and duodenal sweep are unremarkable.
IMPRESSION: Large sessile filling defect in the fundus of the stomach, along the lesser
curvature, likely corresponding to mass recently biopsied on endoscopy.

No additional abnormality.

## 2009-12-27 ENCOUNTER — Ambulatory Visit: Payer: Self-pay | Admitting: Oncology

## 2009-12-31 ENCOUNTER — Ambulatory Visit (HOSPITAL_COMMUNITY): Admission: RE | Admit: 2009-12-31 | Discharge: 2009-12-31 | Payer: Self-pay | Admitting: Oncology

## 2009-12-31 LAB — CBC WITH DIFFERENTIAL/PLATELET
BASO%: 0.6 % (ref 0.0–2.0)
EOS%: 1.6 % (ref 0.0–7.0)
LYMPH%: 24.6 % (ref 14.0–49.0)
MCH: 28.1 pg (ref 27.2–33.4)
MCHC: 33.4 g/dL (ref 32.0–36.0)
MCV: 84.1 fL (ref 79.3–98.0)
MONO#: 0.3 10*3/uL (ref 0.1–0.9)
MONO%: 7.5 % (ref 0.0–14.0)
Platelets: 154 10*3/uL (ref 140–400)
RBC: 4.32 10*6/uL (ref 4.20–5.82)
WBC: 4.3 10*3/uL (ref 4.0–10.3)

## 2009-12-31 LAB — COMPREHENSIVE METABOLIC PANEL
ALT: 21 U/L (ref 0–53)
AST: 29 U/L (ref 0–37)
Alkaline Phosphatase: 38 U/L — ABNORMAL LOW (ref 39–117)
Creatinine, Ser: 1.37 mg/dL (ref 0.40–1.50)
Sodium: 141 mEq/L (ref 135–145)
Total Bilirubin: 0.8 mg/dL (ref 0.3–1.2)
Total Protein: 7 g/dL (ref 6.0–8.3)

## 2010-06-13 ENCOUNTER — Other Ambulatory Visit: Payer: Self-pay | Admitting: Oncology

## 2010-06-13 DIAGNOSIS — C49A0 Gastrointestinal stromal tumor, unspecified site: Secondary | ICD-10-CM

## 2010-06-15 ENCOUNTER — Encounter: Payer: Self-pay | Admitting: Surgery

## 2010-06-16 ENCOUNTER — Other Ambulatory Visit: Payer: Self-pay | Admitting: Oncology

## 2010-06-16 DIAGNOSIS — C49A0 Gastrointestinal stromal tumor, unspecified site: Secondary | ICD-10-CM

## 2010-07-01 ENCOUNTER — Encounter (HOSPITAL_COMMUNITY): Payer: Self-pay

## 2010-07-01 ENCOUNTER — Ambulatory Visit (HOSPITAL_COMMUNITY)
Admission: RE | Admit: 2010-07-01 | Discharge: 2010-07-01 | Disposition: A | Discharge status: Home / Self Care | Payer: 59 | Source: Ambulatory Visit | Attending: Oncology | Admitting: Oncology

## 2010-07-01 ENCOUNTER — Other Ambulatory Visit: Payer: Self-pay | Admitting: Oncology

## 2010-07-01 ENCOUNTER — Encounter (HOSPITAL_BASED_OUTPATIENT_CLINIC_OR_DEPARTMENT_OTHER): Payer: 59 | Admitting: Oncology

## 2010-07-01 DIAGNOSIS — C161 Malignant neoplasm of fundus of stomach: Secondary | ICD-10-CM

## 2010-07-01 DIAGNOSIS — C49A0 Gastrointestinal stromal tumor, unspecified site: Secondary | ICD-10-CM

## 2010-07-01 DIAGNOSIS — C169 Malignant neoplasm of stomach, unspecified: Secondary | ICD-10-CM | POA: Insufficient documentation

## 2010-07-01 DIAGNOSIS — Q619 Cystic kidney disease, unspecified: Secondary | ICD-10-CM | POA: Insufficient documentation

## 2010-07-01 DIAGNOSIS — N200 Calculus of kidney: Secondary | ICD-10-CM | POA: Insufficient documentation

## 2010-07-01 DIAGNOSIS — J984 Other disorders of lung: Secondary | ICD-10-CM | POA: Insufficient documentation

## 2010-07-01 HISTORY — DX: Essential (primary) hypertension: I10

## 2010-07-01 HISTORY — DX: Disorder of kidney and ureter, unspecified: N28.9

## 2010-07-01 HISTORY — DX: Gastrointestinal stromal tumor, unspecified site: C49.A0

## 2010-07-01 LAB — CBC WITH DIFFERENTIAL/PLATELET
BASO%: 0.3 % (ref 0.0–2.0)
Basophils Absolute: 0 10*3/uL (ref 0.0–0.1)
EOS%: 2 % (ref 0.0–7.0)
HCT: 39 % (ref 38.4–49.9)
HGB: 12.8 g/dL — ABNORMAL LOW (ref 13.0–17.1)
LYMPH%: 25.7 % (ref 14.0–49.0)
MCH: 27.2 pg (ref 27.2–33.4)
MONO%: 7.7 % (ref 0.0–14.0)
NEUT#: 3.5 10*3/uL (ref 1.5–6.5)
RBC: 4.71 10*6/uL (ref 4.20–5.82)
RDW: 14.4 % (ref 11.0–14.6)

## 2010-07-01 LAB — COMPREHENSIVE METABOLIC PANEL
AST: 27 U/L (ref 0–37)
BUN: 26 mg/dL — ABNORMAL HIGH (ref 6–23)
Calcium: 9.7 mg/dL (ref 8.4–10.5)
Glucose, Bld: 102 mg/dL — ABNORMAL HIGH (ref 70–99)
Potassium: 4.4 mEq/L (ref 3.5–5.3)
Sodium: 140 mEq/L (ref 135–145)

## 2010-07-01 LAB — LACTATE DEHYDROGENASE: LDH: 150 U/L (ref 94–250)

## 2010-07-09 ENCOUNTER — Encounter (HOSPITAL_BASED_OUTPATIENT_CLINIC_OR_DEPARTMENT_OTHER): Payer: 59 | Admitting: Oncology

## 2010-07-09 ENCOUNTER — Other Ambulatory Visit: Payer: Self-pay | Admitting: Oncology

## 2010-07-09 DIAGNOSIS — C161 Malignant neoplasm of fundus of stomach: Secondary | ICD-10-CM

## 2010-07-09 DIAGNOSIS — C499 Malignant neoplasm of connective and soft tissue, unspecified: Secondary | ICD-10-CM

## 2010-10-07 NOTE — Consult Note (Signed)
NAME:  Lawrence Bailey, Lawrence Bailey NO.:  [id number]   MEDICAL RECORD NO.:  [id number]          PATIENT TYPE:  INP   LOCATION:  1336                         FACILITY:  [facility]   PHYSICIAN:  Aram Beecham B. Eliott Nine, M.D.DATE OF BIRTH:  [birth year]   DATE OF CONSULTATION:  DATE OF DISCHARGE:                                 CONSULTATION   We are asked to see this patient because of a rising creatinine  postsurgery.  He is a 70 year old African American gentleman, followed  by Dr. Renaye Rakers, with a history of hypertension, prior stroke,  history of MI in the past and history of a pulmonary embolus back in the  1980s.  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  On May 16, 2007, underwent a laparoscopic-assisted  partial gastrectomy.  He had some transient hypotension during the  procedure to around 100 systolic, but this was not prolonged.  He had  been on Celebrex for arthritis and Benicar HCT for hypertension up until  May 15, 2007, the day prior to surgery.  His preoperative labs were  done on May 12, 2007 and his creatinine at that time was 1.36 with  a BUN of 24.  His creatinine today, 1 day postoperatively, was 2.6 with  a potassium of 5.6 and this evening, has further increased up to 2.88.  The only urine output recorded was 50 mL earlier today, but he says he  has been voiding some strong urine and has passed it a couple of times  today.  He reports that he missed about 4 hours or so of his IV fluids  last night when they ran into the bed because his IV was out.   PAST MEDICAL HISTORY:  1. Long-standing hypertension.  2. Arthritis, on chronic Celebrex.  3. Glaucoma.  4. History of cataract extraction with lens implant on the left.  5. Remote history of MI with cath in the 1980s, details unknown.  6. History of pulmonary embolus in the 1980s by report.  7. History of stroke  with some right-sided weakness 3-4 years ago,      hospitalized at [facility].  (No records available to me at this      time).   PAST SURGICAL HISTORY:  Includes an inguinal hernia repair, arthroscopic  knee surgeries and a cataract.   SOCIAL HISTORY:  He has a remote alcohol history and no tobacco history.   MEDICATIONS:  Outpatient medications include guanfacine, amlodipine 10  mg a day, Celebrex 200 mg daily, spironolactone 25 mg daily, Benicar HCT  40/25 one daily, Repliva 21/7 brand of iron, vitamin D 1000 units a day,  Xalatan and Combigan eye drops, Os-Cal with vitamin D once a day and  alprazolam at bedtime as needed.  His in-patient medications thus far  have been limited to Ambien, Benadryl, morphine and subcu heparin.   ALLERGIES:  He has no known drug allergies.   FAMILY HISTORY:  Mother had hypertension and died from complications of  heart failure and renal  failure.  Father is deceased, but he does not  know the cause.   SOCIAL HISTORY:  He is married.  He has 8 children.  One of his children  does have diabetes.  He has a remote alcohol and negative smoking  history as mentioned.  He is retired from Education officer, environmental and janitorial-type  work.   REVIEW OF SYSTEMS:  He complains of thirst and postsurgical abdominal  pain.  No nausea or vomiting.  He is tolerating p.o. now.  He is voiding  small amounts of urine which he describes as strong, but is having no  difficulty with the voiding and no dysuria.  He has no chest pain or  shortness of breath.  Reports no problems with swelling in his feet or  legs prior to his procedure.   PHYSICAL EXAMINATION:  GENERAL:  He is a very pleasant gentleman.  VITAL SIGNS:  Blood pressure at the present time supine is 129/66, but  it has been as low as 105/67 earlier today.  Temperature 98.5.  Heart  rate in the 70s.  O2 SAT 100% on room air.  NECK:  He has no neck vein distention.  LUNGS:  His lung fields are entirely clear.  CARDIAC:   Rhythm is regular with an occasional extrasystole, S1 and S2.  No audible S3 or S4.  ABDOMEN:  He has a large abdominal incision as well as several small  laparoscopic incisions which are clean and dry with staples and no  drainage.  EXTREMITIES:  There is no edema of the lower extremities.   LABS:  His laboratory on December 18 was notable for hemoglobin of 11.4,  WBC 5400.  BUN and creatinine 24 and 1.36 respectively.  From 05/17/2007  in the morning:  Hemoglobin 9.7, WBC 13,200.  Sodium 134.  Potassium  5.6.  Chloride 105.  CO2 25. BUN 27.  Creatinine 2.6.  This evening, his  potassium is down to 4.8 and BUN is 34 and creatinine 2.88.   IMPRESSION:  A 70 year old gentleman with a history of hypertension,  arthritis, CAD, and prior stroke, who was on outpatient Celebrex and  Benicar prior to surgery, who has developed acute kidney injury  following laparoscopic-assisted partial gastrectomy for a GIST tumor.  His volume was probably a little bit low.  He had the ARB and NSAID on  board.  Blood pressures were a little soft (and in fact are actually  normal now on no medications) and had a postsurgical hemoglobin  variation.  All of these are likely contributors.   RECOMMENDATIONS:  1. Continue current IV fluid hydration without potassium.  2. Bladder scan to check postvoid residual.  (Patient does not want      Foley replaced unless absolutely necessary).  3. Check renal ultrasound.  4. Urinalysis, urine sodium and creatinine to calculate fractional      sodium excretion.  5. Strict intake and output records.  6. Followup labs in the morning, May 18, 2007.  7. If continued drop in hemoglobin, he might benefit from transfusion.  8. Will continue to hold his ARB (blood pressure is actually just fine      at the present time off any medications) and do not restart his      Celebrex.   Thanks for asking [place] to see this gentleman.  Will follow closely with  you.            ______________________________  D[facility] Salvia. Eliott Nine, M.D.     CBD/MEDQ  D:  05/17/2007  T:  05/18/2007  Job:  [id number]

## 2010-10-07 NOTE — Op Note (Signed)
NAME:  Lawrence, Bailey NO.:  [id number]   MEDICAL RECORD NO.:  [id number]          PATIENT TYPE:  INP   LOCATION:  X006                         FACILITY:  [facility]   PHYSICIAN:  Thornton Park. Daphine Deutscher, MD  DATE OF BIRTH:  1940-11-16   DATE OF PROCEDURE:  05/16/2007  DATE OF DISCHARGE:                               OPERATIVE REPORT   PREOPERATIVE DIAGNOSIS:  Probable just tumor in the fundus of the  stomach along the lesser curvature.   POSTOPERATIVE DIAGNOSIS:  Probable just tumor in the fundus of the  stomach along the lesser curvature.   PROCEDURE:  1. Laparoscopy with dissection and staging.  2. Upper endoscopy per Dr. Ezzard Standing.  3. Open partial gastrectomy.   SURGEON:  Thornton Park. Daphine Deutscher, MD   ASSISTANT:  None.   ANESTHESIA:  General endotracheal.   ESTIMATED BLOOD LOSS:  Minimal.   DESCRIPTION OF PROCEDURE:  Lawrence Bailey is a 66-year O African  American male with the above-mentioned probable GIST of his lesser  curvature.  He was taken to OR room #1 and given general anesthesia on  May 16, 2007.  The abdomen was prepped with Techni-Care and draped  sterilely.  I entered the abdomen through the left upper quadrant using  an OptiVu technique.  Once insufflated, I placed two 10s on the right  side and one and one just to the left of the umbilicus.  I fired in the  upper midline to retract the liver.  I went up and entered the lesser  sac and went up pretty high, and could see this thing posteriorly along  lesser sac.  In the meantime, Dr. Ezzard Standing inserted the endoscope, and  then with careful palpation around the tumor.  We got an idea of its  margins, and felt like we could get around this, probably open him up,  and may be even pulling this tumor up and out.   We did that, making an upper midline incision, and then I grasped the  outside tumor and the inside tumor and went beneath it with the Ethicon  6 cm stapler using a gold load firing this  twice.  That enabled [place] to  completely excise the mass, and then we endoscoped and looked at it, and  had a good free margin, and then we looked at it from the posterior  side, and I oversewed a couple of little staples where there was some  oozing.  Endoscopically with insufflation there were no bubbles noted,  so it was a good intact staple line, everything looked good.   We decompressed the stomach, removed the endoscope, and surveyed  everything.  There had essentially been no contamination in the  operation.  I went ahead and closed the abdomen with running #1 PDS from  above-and-below.  I injected the fascia with some 1/2% Marcaine.  All  the wounds were closed staples.  The patient was taken to recovery room  in satisfactory condition.      Thornton Park Daphine Deutscher, MD  Electronically Signed     MBM/MEDQ  D:  05/16/2007  T:  05/17/2007  Job:  [id number]   cc:   Renaye Rakers, M.D.  Fax: [phone]   Jordan Hawks. Elnoria Howard, MD  Fax: [phone]

## 2010-10-07 NOTE — Discharge Summary (Signed)
NAME:  VALON, GLASSCOCK NO.:  [id number]   MEDICAL RECORD NO.:  [id number]          PATIENT TYPE:  INP   LOCATION:  1336                         [facility]ACILITY:  [facility]   PHYSICIAN:  Thornton Park. Daphine Deutscher, MD  DATE OF BIRTH:  12-31-1940   DATE OF ADMISSION:  05/16/2007  DATE OF DISCHARGE:  05/21/2007                               DISCHARGE SUMMARY   ADMITTING DIAGNOSIS:  GIST of stomach.   HISTORY:  Mr. Holdan Stucke is a 70 year old African American male who  had the above findings on endoscopy.  This was found to be ulcerated  mass that was about 7 cm.  Laparoscopy was performed, and it was felt  that this was posterior, and was not amenable to combined  laparoscopic/endoscopic approach.  I went ahead and opened him, and then  found this mass in the posterior limb of the stomach; and with  applications of the Endo-GIA was able to staple it out with a negative  margin.   Postoperatively he did well, except he has borderline renal function;  and we bumped up his creatinine and we got nephrology to see him, and he  was better hydrated, and his creatinine returned into the normal range.  Because of the number mitotic fields in the specimen we were going to go  ahead and get a followup consultation with medical oncology for possible  Gleevec therapy.  In the meantime his incisions were healing nicely.  He  was doing well and taking a diet, and he was ready for discharge.   CONDITION:  Improved.      Thornton Park Daphine Deutscher, MD  Electronically Signed     MBM/MEDQ  D:  06/06/2007  T:  06/06/2007  Job:  [id number]

## 2010-10-07 NOTE — H&P (Signed)
NAME:  REINHART, SAULTERS NO.:  [id number]   MEDICAL RECORD NO.:  [id number]           PATIENT TYPE:   LOCATION:                                 FACILITY:   PHYSICIAN:  Vesta Mixer, M.D. DATE OF BIRTH:  [birth year]   DATE OF ADMISSION:  DATE OF DISCHARGE:                              HISTORY & PHYSICAL   Hayzen Lorenson is a 70 year old gentleman who has a history of  hypertension, cardiomegaly, stomach cancer, and colon cancer.  He is now  admitted to the hospital for an outpatient heart catheterization for  followup of some chest pain an abnormal stress test.   Mr. Kluttz is a 70 year old gentleman with several medical problems.  He has a history of stomach cancer and colon cancer.  He has also had a  history of a stroke.  He presented to me last month with some episodes  of chest heaviness.  The pain was also associated with lots of gas and  indigestion.  There is no shortness breath, nausea, vomiting, or  diaphoresis.   His EKG is consistent with an anteroseptal myocardial infarction with  deep T-wave inversions.  Because of these findings, he had several tests  including an echocardiogram and a nuclear stress test.   The echocardiogram reveals overall well-preserved left ventricular  systolic function.  He has an EF of around 55-60%.  He has wall  thickness of 16 at 17 mm.  He was found to have mild aortic  insufficiency, mild mitral regurgitation, mild tricuspid regurgitation  with mild pulmonary hypertension.  He also has mild pulmonic  insufficiency.   His nuclear stress test reveals some attenuation of the anterior wall  with fairly well preserved left ventricular systolic function.  It was  thought that he may have some subendocardial ischemia in that anterior  wall.   Mr. Bink feels a little bit better since I saw him.  He has not had  any recurrent episodes of chest pain.  He denies any syncope or  presyncope.  He denies any PND or  orthopnea.  He denies any heat or cold  intolerance, weight gain, or weight loss.  He denies any cough or sputum  production.  All other systems were reviewed and are negative.   CURRENT MEDICATIONS:  1. Celebrex 200 mg a day.  2. Benicar HCT 40/25 mg once a day.  3. Guaifenesin 2 mg nightly.  4. Vitamin D 1000 international units a day.  5. Vitamins once a day.  6. Amlodipine 10 mg a day.  7. Gleevec 400 mg a day.  8. Metolazone 5 mg a day.  9. Folic acid 800 mcg a day.  10.Testosterone gel once a day.   ALLERGIES:  He is allergic to SULFA.   PAST MEDICAL HISTORY:  1. Hypertension.  2. History of colon cancer.  3. History of stomach cancer.  4. Stroke.  5. History of left ventricular hypertrophy.   SOCIAL HISTORY:  The patient is currently disabled.  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  He does not smoke.  He does not  drink alcohol.  He walks several times a week several miles.   FAMILY HISTORY:  His father died at age 9 due to unknown causes.  His  mother died at age 86.  She had a history of hypertension.   REVIEW OF SYSTEMS:  As noted in the HPI.   PHYSICAL EXAMINATION:  GENERAL:  He is a middle-aged gentleman in no  acute distress.  He is alert and oriented x3 and his mood and affect are  normal.  HEENT:  He is normocephalic and atraumatic.  NECK:  Supple.  LUNGS:  Clear.  BACK:  Nontender.  HEART:  Regular rate.  S1 and S2.  He has a 2/6 systolic ejection murmur  at the left sternal border.  His PMI is nondisplaced.  His chest wall is  nontender.  ABDOMEN:  Good bowel sounds.  There is no hepatosplenomegaly.  He has no  masses or bruits.  No guarding or rebound.  EXTREMITIES:  He has no clubbing, cyanosis, or edema.  His pulse are 2+.  His calves are nontender.  His gait is normal.  SKIN:  Without rashes.  NEUROLOGIC:  Cranial nerves II through XII are intact and his motor and  sensory functions are intact.   LABORATORY DATA:  Including a  basic metabolic profile, CBC with  differential, PT and PTT well drawn and are normal.   Mr. Bazen presents with some unusual findings.  He certainly has a  markedly abnormal EKG and this may be due to left ventricular  hypertrophy.  I am concerned in that his anterior wall appears to have  some evidence of ischemia.  We have discussed the possibilities of doing  a heart catheterization.  He understands and agrees to proceed.  He did  relate to me the fact that he had a pulmonary embolus when he had a  heart catheterization by Dr. Sharrie Rothman back in early 1980s.  I told him  that the risk of having a complication was certainly very low, but we  did review the risk.  He understands and agrees to proceed.      Vesta Mixer, M.D.  Electronically Signed     PJN/MEDQ  D:  07/11/2008  T:  07/12/2008  Job:  [id number]   cc:   Renaye Rakers, M.D.

## 2011-01-06 ENCOUNTER — Other Ambulatory Visit: Payer: Self-pay | Admitting: Oncology

## 2011-01-06 ENCOUNTER — Ambulatory Visit (HOSPITAL_COMMUNITY)
Admission: RE | Admit: 2011-01-06 | Discharge: 2011-01-06 | Disposition: A | Discharge status: Home / Self Care | Payer: 59 | Source: Ambulatory Visit | Attending: Oncology | Admitting: Oncology

## 2011-01-06 ENCOUNTER — Encounter (HOSPITAL_COMMUNITY): Payer: Self-pay

## 2011-01-06 ENCOUNTER — Encounter (HOSPITAL_BASED_OUTPATIENT_CLINIC_OR_DEPARTMENT_OTHER): Payer: 59 | Admitting: Oncology

## 2011-01-06 DIAGNOSIS — C161 Malignant neoplasm of fundus of stomach: Secondary | ICD-10-CM

## 2011-01-06 DIAGNOSIS — Z85028 Personal history of other malignant neoplasm of stomach: Secondary | ICD-10-CM | POA: Insufficient documentation

## 2011-01-06 DIAGNOSIS — Z9221 Personal history of antineoplastic chemotherapy: Secondary | ICD-10-CM | POA: Insufficient documentation

## 2011-01-06 DIAGNOSIS — N289 Disorder of kidney and ureter, unspecified: Secondary | ICD-10-CM | POA: Insufficient documentation

## 2011-01-06 DIAGNOSIS — C499 Malignant neoplasm of connective and soft tissue, unspecified: Secondary | ICD-10-CM

## 2011-01-06 DIAGNOSIS — R911 Solitary pulmonary nodule: Secondary | ICD-10-CM | POA: Insufficient documentation

## 2011-01-06 LAB — CMP (CANCER CENTER ONLY)
AST: 29 U/L (ref 11–38)
Albumin: 3.6 g/dL (ref 3.3–5.5)
BUN, Bld: 30 mg/dL — ABNORMAL HIGH (ref 7–22)
CO2: 29 mEq/L (ref 18–33)
Calcium: 9.3 mg/dL (ref 8.0–10.3)
Chloride: 98 mEq/L (ref 98–108)
Creat: 1.8 mg/dl — ABNORMAL HIGH (ref 0.6–1.2)
Glucose, Bld: 99 mg/dL (ref 73–118)
Potassium: 4.7 mEq/L (ref 3.3–4.7)

## 2011-01-06 LAB — CBC WITH DIFFERENTIAL/PLATELET
Basophils Absolute: 0.1 10*3/uL (ref 0.0–0.1)
Eosinophils Absolute: 0.1 10*3/uL (ref 0.0–0.5)
HCT: 40 % (ref 38.4–49.9)
HGB: 13.3 g/dL (ref 13.0–17.1)
MCH: 27.3 pg (ref 27.2–33.4)
MONO#: 0.4 10*3/uL (ref 0.1–0.9)
NEUT#: 4.1 10*3/uL (ref 1.5–6.5)
NEUT%: 70.8 % (ref 39.0–75.0)
RDW: 14.5 % (ref 11.0–14.6)
lymph#: 1.2 10*3/uL (ref 0.9–3.3)

## 2011-01-06 LAB — LACTATE DEHYDROGENASE: LDH: 174 U/L (ref 94–250)

## 2011-01-13 ENCOUNTER — Encounter (HOSPITAL_BASED_OUTPATIENT_CLINIC_OR_DEPARTMENT_OTHER): Payer: 59 | Admitting: Oncology

## 2011-01-13 DIAGNOSIS — C161 Malignant neoplasm of fundus of stomach: Secondary | ICD-10-CM

## 2011-01-14 ENCOUNTER — Other Ambulatory Visit: Payer: Self-pay | Admitting: Oncology

## 2011-01-14 DIAGNOSIS — C49A2 Gastrointestinal stromal tumor of stomach: Secondary | ICD-10-CM

## 2011-02-27 LAB — CBC
HCT: 25.9 — ABNORMAL LOW
HCT: 28.1 — ABNORMAL LOW
Hemoglobin: 8.7 — ABNORMAL LOW
Hemoglobin: 9.7 — ABNORMAL LOW
MCHC: 33.2
MCHC: 33.5
MCHC: 33.8
MCHC: 34.1
MCHC: 34.4
MCV: 81.7
MCV: 81.7
MCV: 83.1
Platelets: 178
RBC: 3.17 — ABNORMAL LOW
RBC: 3.21 — ABNORMAL LOW
RDW: 14.5
RDW: 14.7
RDW: 14.7

## 2011-02-27 LAB — BASIC METABOLIC PANEL
BUN: 12
BUN: 34 — ABNORMAL HIGH
BUN: 8
CO2: 25
CO2: 26
CO2: 27
CO2: 27
Calcium: 9
Calcium: 9.2
Chloride: 102
Chloride: 103
Chloride: 106
Creatinine, Ser: 1.03
Creatinine, Ser: 1.15
Creatinine, Ser: 1.7 — ABNORMAL HIGH
GFR calc Af Amer: 49 — ABNORMAL LOW
GFR calc Af Amer: >60
GFR calc non Af Amer: 22 — ABNORMAL LOW
GFR calc non Af Amer: 25 — ABNORMAL LOW
Glucose, Bld: 123 — ABNORMAL HIGH
Glucose, Bld: 134 — ABNORMAL HIGH
Glucose, Bld: 151 — ABNORMAL HIGH
Potassium: 4.8
Potassium: 4.9
Potassium: 5.6 — ABNORMAL HIGH
Sodium: 134 — ABNORMAL LOW
Sodium: 138

## 2011-02-27 LAB — URINALYSIS, ROUTINE W REFLEX MICROSCOPIC
Bilirubin Urine: NEGATIVE
Glucose, UA: NEGATIVE
Hgb urine dipstick: NEGATIVE
Specific Gravity, Urine: 1.007
Urobilinogen, UA: 0.2

## 2011-02-27 LAB — COMPREHENSIVE METABOLIC PANEL
ALT: 19
AST: 21
Albumin: 4.2
Calcium: 10
GFR calc Af Amer: >60
Sodium: 138
Total Protein: 7.5

## 2011-02-27 LAB — RENAL FUNCTION PANEL
Calcium: 9
Creatinine, Ser: 1.02
GFR calc Af Amer: >60
GFR calc non Af Amer: >60
Glucose, Bld: 149 — ABNORMAL HIGH
Phosphorus: 2.5
Sodium: 135

## 2011-04-22 ENCOUNTER — Telehealth: Payer: Self-pay | Admitting: Oncology

## 2011-04-22 NOTE — Telephone Encounter (Signed)
gve the pt his feb 2013 appt calendar along with the ct scan appt with instructions. °

## 2011-07-15 ENCOUNTER — Other Ambulatory Visit (HOSPITAL_BASED_OUTPATIENT_CLINIC_OR_DEPARTMENT_OTHER): Payer: 59 | Admitting: Lab

## 2011-07-15 ENCOUNTER — Telehealth: Payer: Self-pay

## 2011-07-15 ENCOUNTER — Ambulatory Visit (HOSPITAL_COMMUNITY)
Admission: RE | Admit: 2011-07-15 | Discharge: 2011-07-15 | Disposition: A | Discharge status: Home / Self Care | Payer: 59 | Source: Ambulatory Visit | Attending: Oncology | Admitting: Oncology

## 2011-07-15 DIAGNOSIS — R141 Gas pain: Secondary | ICD-10-CM | POA: Diagnosis not present

## 2011-07-15 DIAGNOSIS — Z9221 Personal history of antineoplastic chemotherapy: Secondary | ICD-10-CM | POA: Insufficient documentation

## 2011-07-15 DIAGNOSIS — R1013 Epigastric pain: Secondary | ICD-10-CM | POA: Diagnosis not present

## 2011-07-15 DIAGNOSIS — N400 Enlarged prostate without lower urinary tract symptoms: Secondary | ICD-10-CM | POA: Insufficient documentation

## 2011-07-15 DIAGNOSIS — R911 Solitary pulmonary nodule: Secondary | ICD-10-CM | POA: Diagnosis not present

## 2011-07-15 DIAGNOSIS — C161 Malignant neoplasm of fundus of stomach: Secondary | ICD-10-CM

## 2011-07-15 DIAGNOSIS — N281 Cyst of kidney, acquired: Secondary | ICD-10-CM | POA: Insufficient documentation

## 2011-07-15 DIAGNOSIS — C49A2 Gastrointestinal stromal tumor of stomach: Secondary | ICD-10-CM

## 2011-07-15 DIAGNOSIS — C494 Malignant neoplasm of connective and soft tissue of abdomen: Secondary | ICD-10-CM | POA: Insufficient documentation

## 2011-07-15 DIAGNOSIS — R110 Nausea: Secondary | ICD-10-CM | POA: Insufficient documentation

## 2011-07-15 DIAGNOSIS — K5730 Diverticulosis of large intestine without perforation or abscess without bleeding: Secondary | ICD-10-CM | POA: Insufficient documentation

## 2011-07-15 DIAGNOSIS — N200 Calculus of kidney: Secondary | ICD-10-CM | POA: Insufficient documentation

## 2011-07-15 DIAGNOSIS — K4090 Unilateral inguinal hernia, without obstruction or gangrene, not specified as recurrent: Secondary | ICD-10-CM | POA: Insufficient documentation

## 2011-07-15 LAB — CBC WITH DIFFERENTIAL/PLATELET
BASO%: 0.3 % (ref 0.0–2.0)
Eosinophils Absolute: 0.1 10*3/uL (ref 0.0–0.5)
MCHC: 32.9 g/dL (ref 32.0–36.0)
MONO#: 0.5 10*3/uL (ref 0.1–0.9)
NEUT#: 4.9 10*3/uL (ref 1.5–6.5)
Platelets: 153 10*3/uL (ref 140–400)
RBC: 4.75 10*6/uL (ref 4.20–5.82)
RDW: 14.4 % (ref 11.0–14.6)
WBC: 7.1 10*3/uL (ref 4.0–10.3)
lymph#: 1.5 10*3/uL (ref 0.9–3.3)

## 2011-07-15 LAB — COMPREHENSIVE METABOLIC PANEL
ALT: 15 U/L (ref 0–53)
AST: 17 U/L (ref 0–37)
Albumin: 4.3 g/dL (ref 3.5–5.2)
Alkaline Phosphatase: 45 U/L (ref 39–117)
Glucose, Bld: 103 mg/dL — ABNORMAL HIGH (ref 70–99)
Potassium: 4.9 mEq/L (ref 3.5–5.3)
Sodium: 137 mEq/L (ref 135–145)
Total Bilirubin: 0.5 mg/dL (ref 0.3–1.2)
Total Protein: 7.2 g/dL (ref 6.0–8.3)

## 2011-07-15 LAB — MORPHOLOGY: PLT EST: ADEQUATE

## 2011-07-15 NOTE — Telephone Encounter (Signed)
Pt notified of CT results per Dr Patsy Lager note.  Pt's labs from this am forwarded to Dr Orpah Cobb per pt request. dph

## 2011-07-15 NOTE — Telephone Encounter (Signed)
Message copied by Albertha Ghee on Wed Jul 15, 2011  2:42 PM ------      Message from: Levert Feinstein      Created: Wed Jul 15, 2011  1:28 PM       Call pt scans  good

## 2011-07-16 ENCOUNTER — Telehealth: Payer: Self-pay | Admitting: *Deleted

## 2011-07-16 NOTE — Telephone Encounter (Signed)
Message left for pt on identified vm to call back for CT results.

## 2011-07-16 NOTE — Telephone Encounter (Signed)
Message copied by Sabino Snipes on Thu Jul 16, 2011 12:38 PM ------      Message from: Levert Feinstein      Created: Wed Jul 15, 2011  1:28 PM       Call pt scans  good

## 2011-07-20 ENCOUNTER — Encounter: Payer: Self-pay | Admitting: Nurse Practitioner

## 2011-07-20 ENCOUNTER — Telehealth: Payer: Self-pay | Admitting: Oncology

## 2011-07-20 ENCOUNTER — Ambulatory Visit (HOSPITAL_BASED_OUTPATIENT_CLINIC_OR_DEPARTMENT_OTHER): Payer: 59 | Admitting: Nurse Practitioner

## 2011-07-20 VITALS — BP 134/78 | HR 52 | Temp 97.6°F | Ht 70.0 in | Wt 196.1 lb

## 2011-07-20 DIAGNOSIS — N189 Chronic kidney disease, unspecified: Secondary | ICD-10-CM | POA: Diagnosis not present

## 2011-07-20 DIAGNOSIS — Z8509 Personal history of malignant neoplasm of other digestive organs: Secondary | ICD-10-CM | POA: Insufficient documentation

## 2011-07-20 DIAGNOSIS — I10 Essential (primary) hypertension: Secondary | ICD-10-CM

## 2011-07-20 NOTE — Progress Notes (Signed)
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  He presented with a 2 to 77-month history of vague epigastric discomfort and was found to have a microcytic anemia.  Upper GI series showed a filling defect in the fundus of the stomach along the lesser curvature.  He underwent partial gastrectomy on 05/16/2007.  Pathology showed a 6 cm GI stromal tumor.  No vascular or lymphatic invasion.  All surgical margins negative.  Lymph nodes not sampled.  Tumor confined to the submucosa.  Mitotic activity was moderate to high with 25-50 mitoses per high-power microscope field. He completed Gleevec 400 mg daily for one year. He is seen today for scheduled followup.  Lawrence Bailey reports that he feels well. No nausea/vomiting. He has mild intermittent constipation. No hematochezia or melena. He denies abdominal pain. He has a good appetite. He is gaining weight. He plans to begin an exercise regimen consisting of walking on a treadmill.   Objective: Blood pressure 134/78, pulse 52, temperature 97.6 F (36.4 C), temperature source Oral, height 5\' 10"  (1.778 m), weight 196 lb 1.6 oz (88.95 kg).  Oropharynx is without thrush or ulceration. No palpable cervical, supraclavicular, axillary or inguinal lymph nodes. Lungs are clear. Regular cardiac rhythm. Abdomen is soft and nontender. No mass. No organomegaly. Extremities are without edema.  Lab Results: Lab Results  Component Value Date   WBC 7.1 07/15/2011   HGB 13.0 07/15/2011   HCT 39.4 07/15/2011   MCV 83.1 07/15/2011   PLT 153 07/15/2011    Chemistry:    Chemistry      Component Value Date/Time   NA 137 07/15/2011 0844   NA 139 01/06/2011 1024   K 4.9 07/15/2011 0844   K 4.7 01/06/2011 1024   CL 103 07/15/2011 0844   CL 98 01/06/2011 1024   CO2 27 07/15/2011 0844   CO2 29 01/06/2011 1024   BUN 31* 07/15/2011 0844   BUN 30* 01/06/2011 1024   CREATININE 1.78*  07/15/2011 0844   CREATININE 1.8* 01/06/2011 1024      Component Value Date/Time   CALCIUM 10.1 07/15/2011 0844   CALCIUM 9.3 01/06/2011 1024   ALKPHOS 45 07/15/2011 0844   ALKPHOS 57 01/06/2011 1024   AST 17 07/15/2011 0844   AST 29 01/06/2011 1024   ALT 15 07/15/2011 0844   BILITOT 0.5 07/15/2011 0844   BILITOT 0.80 01/06/2011 1024       Studies/Results: Ct Abdomen Pelvis Wo Contrast  07/15/2011  *RADIOLOGY REPORT*  Clinical Data:  GI stromal tumor.  Chemotherapy complete.  Hernia repair and small bowel resection.  Epigastric pain and nausea. Gastric stromal tumor.  CT CHEST, ABDOMEN AND PELVIS WITHOUT  CONTRAST  Technique:  Contiguous axial images of the chest abdomen and pelvis were obtained without  IV contrast administration.  Comparison: 01/06/2011.  CT CHEST  Findings: Lung windows demonstrate probable secretions on the right side of the trachea, including on image 9. Perifissural nodule on the right major fissure on image 32 is unchanged and likely a subpleural lymph node.  Similar 3 mm right upper lobe nodule on image 22 is also unchanged and likely a subpleural lymph node.  No new or enlarging nodules.  Soft tissue windows demonstrate heart size upper normal with minimal pericardial fluid thickening, similar.  No pleural effusion.  Stable small middle mediastinal nodes, without adenopathy.  No definite hilar adenopathy, given limitation of unenhanced  CT.  Contrast filled thoracic esophagus on image 24 with air-fluid level image 19.  IMPRESSION:  1. No acute process or evidence of metastatic disease in the chest. 2. Esophageal air fluid level suggests dysmotility or gastroesophageal reflux.  CT ABDOMEN AND PELVIS  Findings:  Normal uninfused appearance of the liver.  A splenule. Normal stomach, pancreas, gallbladder, biliary tract.  Mild left adrenal thickening which is unchanged.  Left renal calculi, non obstructive.  Bilateral renal cysts.  Right-sided cyst measures 5.1 cm.  No retroperitoneal or  retrocrural adenopathy.  Scattered colonic diverticula.  Normal terminal ileum and appendix. Normal small bowel without abdominal ascites.    No evidence of omental or peritoneal disease.  Fat containing left inguinal hernia.  Small inguinal nodes are similar to 07/01/2010 and maintain their fatty hila, likely reactive.  Normal urinary bladder.  Mild to moderate prostatomegaly.  Trace cul-de-sac fluid on image 104.  Pelvis not imaged on the prior.  Not present on 07/01/2010.  Scattered sclerotic lesions throughout the pelvis, which are unchanged and likely bone islands.  IMPRESSION:  1. No acute process or evidence of metastatic disease in the abdomen or pelvis. 2.  Degraded exam due to lack of IV contrast. 3.  New since 07/01/2010 trace cul-de-sac fluid.  Indeterminate etiology.  No cause, including omental/peritoneal disease identified.  Original Report Authenticated By: Consuello Bossier, M.D.   Ct Chest Wo Contrast  07/15/2011  *RADIOLOGY REPORT*  Clinical Data:  GI stromal tumor.  Chemotherapy complete.  Hernia repair and small bowel resection.  Epigastric pain and nausea. Gastric stromal tumor.  CT CHEST, ABDOMEN AND PELVIS WITHOUT  CONTRAST  Technique:  Contiguous axial images of the chest abdomen and pelvis were obtained without  IV contrast administration.  Comparison: 01/06/2011.  CT CHEST  Findings: Lung windows demonstrate probable secretions on the right side of the trachea, including on image 9. Perifissural nodule on the right major fissure on image 32 is unchanged and likely a subpleural lymph node.  Similar 3 mm right upper lobe nodule on image 22 is also unchanged and likely a subpleural lymph node.  No new or enlarging nodules.  Soft tissue windows demonstrate heart size upper normal with minimal pericardial fluid thickening, similar.  No pleural effusion.  Stable small middle mediastinal nodes, without adenopathy.  No definite hilar adenopathy, given limitation of unenhanced CT.  Contrast filled  thoracic esophagus on image 24 with air-fluid level image 19.  IMPRESSION:  1. No acute process or evidence of metastatic disease in the chest. 2. Esophageal air fluid level suggests dysmotility or gastroesophageal reflux.  CT ABDOMEN AND PELVIS  Findings:  Normal uninfused appearance of the liver.  A splenule. Normal stomach, pancreas, gallbladder, biliary tract.  Mild left adrenal thickening which is unchanged.  Left renal calculi, non obstructive.  Bilateral renal cysts.  Right-sided cyst measures 5.1 cm.  No retroperitoneal or retrocrural adenopathy.  Scattered colonic diverticula.  Normal terminal ileum and appendix. Normal small bowel without abdominal ascites.    No evidence of omental or peritoneal disease.  Fat containing left inguinal hernia.  Small inguinal nodes are similar to 07/01/2010 and maintain their fatty hila, likely reactive.  Normal urinary bladder.  Mild to moderate prostatomegaly.  Trace cul-de-sac fluid on image 104.  Pelvis not imaged on the prior.  Not present on 07/01/2010.  Scattered sclerotic lesions throughout the pelvis, which are unchanged and likely bone islands.  IMPRESSION:  1. No acute process or evidence of metastatic disease in the  abdomen or pelvis. 2.  Degraded exam due to lack of IV contrast. 3.  New since 07/01/2010 trace cul-de-sac fluid.  Indeterminate etiology.  No cause, including omental/peritoneal disease identified.  Original Report Authenticated By: Consuello Bossier, M.D.    Medications: I have reviewed the patient's current medications.  Assessment/Plan: 1. 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 Status post partial gastrectomy 05/16/2007. Pathology showed a 6 cm GI stromal tumor. There was no vascular or lymphatic invasion. All surgical margins were negative. Lymph nodes were not sampled. The tumor was confined to the submucosa. Mitotic activity was moderate to high with 25-50 mitoses per high power field. He completed 1 year of Gleevec  400 mg daily. 2. Essential hypertension. 3. Chronic renal insufficiency.  Disposition-Mr. Bordas appears stable. He remains in a clinical and radiographic remission. He will return for a followup visit in 9 months with labs and a CT scan 1 week prior to the visit. He will contact the office in the interim with any problems.  Plan reviewed with Dr. Cyndie Chime.  Lonna Cobb ANP/GNP-BC

## 2011-07-20 NOTE — Telephone Encounter (Signed)
Gv pt appt for nov2013.  scheduled pt for ct scan on 11/04 @ [facility]

## 2011-08-19 DIAGNOSIS — Z961 Presence of intraocular lens: Secondary | ICD-10-CM | POA: Diagnosis not present

## 2011-08-19 DIAGNOSIS — H5010 Unspecified exotropia: Secondary | ICD-10-CM | POA: Diagnosis not present

## 2011-08-19 DIAGNOSIS — H4011X0 Primary open-angle glaucoma, stage unspecified: Secondary | ICD-10-CM | POA: Diagnosis not present

## 2011-08-19 DIAGNOSIS — H409 Unspecified glaucoma: Secondary | ICD-10-CM | POA: Diagnosis not present

## 2011-11-04 DIAGNOSIS — E291 Testicular hypofunction: Secondary | ICD-10-CM | POA: Diagnosis not present

## 2012-01-23 ENCOUNTER — Telehealth: Payer: Self-pay | Admitting: Oncology

## 2012-01-23 NOTE — Telephone Encounter (Signed)
Called pt and left message regarding appt that was reschedule from 11/8 MD visit to 11/15 per MD

## 2012-01-24 ENCOUNTER — Observation Stay (HOSPITAL_COMMUNITY)
Admission: EM | Admit: 2012-01-24 | Discharge: 2012-01-25 | Disposition: A | Discharge status: Home / Self Care | Payer: No Typology Code available for payment source | Attending: Surgery | Admitting: Surgery

## 2012-01-24 ENCOUNTER — Emergency Department (HOSPITAL_COMMUNITY): Payer: No Typology Code available for payment source

## 2012-01-24 ENCOUNTER — Encounter (HOSPITAL_COMMUNITY): Payer: Self-pay | Admitting: *Deleted

## 2012-01-24 DIAGNOSIS — R109 Unspecified abdominal pain: Secondary | ICD-10-CM | POA: Diagnosis not present

## 2012-01-24 DIAGNOSIS — M7989 Other specified soft tissue disorders: Secondary | ICD-10-CM | POA: Diagnosis not present

## 2012-01-24 DIAGNOSIS — S0990XA Unspecified injury of head, initial encounter: Secondary | ICD-10-CM | POA: Diagnosis not present

## 2012-01-24 DIAGNOSIS — S0993XA Unspecified injury of face, initial encounter: Secondary | ICD-10-CM | POA: Diagnosis not present

## 2012-01-24 DIAGNOSIS — R0602 Shortness of breath: Secondary | ICD-10-CM | POA: Insufficient documentation

## 2012-01-24 DIAGNOSIS — M79609 Pain in unspecified limb: Secondary | ICD-10-CM | POA: Insufficient documentation

## 2012-01-24 DIAGNOSIS — S27329A Contusion of lung, unspecified, initial encounter: Principal | ICD-10-CM | POA: Insufficient documentation

## 2012-01-24 DIAGNOSIS — IMO0002 Reserved for concepts with insufficient information to code with codable children: Secondary | ICD-10-CM | POA: Diagnosis not present

## 2012-01-24 DIAGNOSIS — M47812 Spondylosis without myelopathy or radiculopathy, cervical region: Secondary | ICD-10-CM | POA: Insufficient documentation

## 2012-01-24 DIAGNOSIS — Y92410 Unspecified street and highway as the place of occurrence of the external cause: Secondary | ICD-10-CM | POA: Insufficient documentation

## 2012-01-24 DIAGNOSIS — T148XXA Other injury of unspecified body region, initial encounter: Secondary | ICD-10-CM

## 2012-01-24 DIAGNOSIS — S7010XA Contusion of unspecified thigh, initial encounter: Secondary | ICD-10-CM | POA: Diagnosis not present

## 2012-01-24 DIAGNOSIS — R404 Transient alteration of awareness: Secondary | ICD-10-CM | POA: Insufficient documentation

## 2012-01-24 DIAGNOSIS — R079 Chest pain, unspecified: Secondary | ICD-10-CM | POA: Insufficient documentation

## 2012-01-24 HISTORY — DX: Atherosclerotic heart disease of native coronary artery without angina pectoris: I25.10

## 2012-01-24 LAB — CBC WITH DIFFERENTIAL/PLATELET
Basophils Relative: 0 % (ref 0–1)
Eosinophils Absolute: 0.1 10*3/uL (ref 0.0–0.7)
Eosinophils Relative: 1 % (ref 0–5)
Hemoglobin: 13.5 g/dL (ref 13.0–17.0)
Lymphs Abs: 1.7 10*3/uL (ref 0.7–4.0)
MCH: 27.2 pg (ref 26.0–34.0)
MCHC: 32.7 g/dL (ref 30.0–36.0)
MCV: 83.1 fL (ref 78.0–100.0)
Monocytes Absolute: 0.4 10*3/uL (ref 0.1–1.0)
Monocytes Relative: 5 % (ref 3–12)
RBC: 4.97 MIL/uL (ref 4.22–5.81)

## 2012-01-24 LAB — BASIC METABOLIC PANEL
BUN: 26 mg/dL — ABNORMAL HIGH (ref 6–23)
CO2: 24 mEq/L (ref 19–32)
Glucose, Bld: 85 mg/dL (ref 70–99)
Potassium: 3.8 mEq/L (ref 3.5–5.1)
Sodium: 137 mEq/L (ref 135–145)

## 2012-01-24 MED ORDER — DOCUSATE SODIUM 100 MG PO CAPS
100.0000 mg | ORAL_CAPSULE | Freq: Two times a day (BID) | ORAL | Status: DC
  Administered 2012-01-25: 100 mg via ORAL
  Filled 2012-01-24 (×2): qty 1

## 2012-01-24 MED ORDER — SODIUM CHLORIDE 0.9 % IV SOLN: 250.0000 mL | INTRAVENOUS | Status: DC | PRN

## 2012-01-24 MED ORDER — ONDANSETRON HCL 4 MG/2ML IJ SOLN
4.0000 mg | Freq: Once | INTRAMUSCULAR | Status: AC
  Administered 2012-01-24: 4 mg via INTRAVENOUS
  Filled 2012-01-24: qty 2

## 2012-01-24 MED ORDER — BISACODYL 10 MG RE SUPP: 10.0000 mg | Freq: Every day | RECTAL | Status: DC | PRN

## 2012-01-24 MED ORDER — SODIUM CHLORIDE 0.9 % IJ SOLN: 3.0000 mL | INTRAMUSCULAR | Status: DC | PRN

## 2012-01-24 MED ORDER — DORZOLAMIDE HCL 2 % OP SOLN
1.0000 [drp] | Freq: Two times a day (BID) | OPHTHALMIC | Status: DC
  Administered 2012-01-25: 1 [drp] via OPHTHALMIC
  Filled 2012-01-24: qty 10

## 2012-01-24 MED ORDER — ONDANSETRON HCL 4 MG PO TABS: 4.0000 mg | ORAL_TABLET | Freq: Four times a day (QID) | ORAL | Status: DC | PRN

## 2012-01-24 MED ORDER — MORPHINE SULFATE 4 MG/ML IJ SOLN: 4.0000 mg | Freq: Once | INTRAMUSCULAR | Status: DC

## 2012-01-24 MED ORDER — MORPHINE SULFATE 4 MG/ML IJ SOLN
4.0000 mg | Freq: Once | INTRAMUSCULAR | Status: AC
  Administered 2012-01-24: 4 mg via INTRAVENOUS
  Filled 2012-01-24: qty 1

## 2012-01-24 MED ORDER — SPIRONOLACTONE 25 MG PO TABS
25.0000 mg | ORAL_TABLET | Freq: Every day | ORAL | Status: DC
  Administered 2012-01-25: 25 mg via ORAL
  Filled 2012-01-24: qty 1

## 2012-01-24 MED ORDER — HYDROCODONE-ACETAMINOPHEN 5-325 MG PO TABS: 0.5000 | ORAL_TABLET | ORAL | Status: DC | PRN

## 2012-01-24 MED ORDER — HYDROCODONE-ACETAMINOPHEN 5-325 MG PO TABS
2.0000 | ORAL_TABLET | ORAL | Status: DC | PRN
  Administered 2012-01-24 – 2012-01-25 (×4): 2 via ORAL
  Filled 2012-01-24 (×4): qty 2

## 2012-01-24 MED ORDER — ONDANSETRON HCL 4 MG/2ML IJ SOLN: 4.0000 mg | Freq: Four times a day (QID) | INTRAMUSCULAR | Status: DC | PRN

## 2012-01-24 MED ORDER — ZOLPIDEM TARTRATE 5 MG PO TABS: 5.0000 mg | ORAL_TABLET | Freq: Every evening | ORAL | Status: DC | PRN

## 2012-01-24 MED ORDER — NEBIVOLOL HCL 10 MG PO TABS
20.0000 mg | ORAL_TABLET | Freq: Every day | ORAL | Status: DC
  Administered 2012-01-25: 20 mg via ORAL
  Filled 2012-01-24 (×2): qty 2

## 2012-01-24 MED ORDER — IOHEXOL 300 MG/ML  SOLN
80.0000 mL | Freq: Once | INTRAMUSCULAR | Status: AC | PRN
  Administered 2012-01-24: 80 mL via INTRAVENOUS

## 2012-01-24 MED ORDER — SODIUM CHLORIDE 0.9 % IV BOLUS (SEPSIS)
1000.0000 mL | Freq: Once | INTRAVENOUS | Status: AC
  Administered 2012-01-24: 1000 mL via INTRAVENOUS

## 2012-01-24 MED ORDER — HYDROCODONE-ACETAMINOPHEN 5-325 MG PO TABS: 1.0000 | ORAL_TABLET | ORAL | Status: DC | PRN

## 2012-01-24 MED ORDER — FENTANYL CITRATE 0.05 MG/ML IJ SOLN
100.0000 ug | Freq: Once | INTRAMUSCULAR | Status: AC
  Administered 2012-01-24: 100 ug via INTRAVENOUS
  Filled 2012-01-24: qty 2

## 2012-01-24 MED ORDER — MORPHINE SULFATE 2 MG/ML IJ SOLN
1.0000 mg | INTRAMUSCULAR | Status: DC | PRN
  Administered 2012-01-24: 2 mg via INTRAVENOUS
  Filled 2012-01-24: qty 1

## 2012-01-24 MED ORDER — FOLIC ACID 0.5 MG HALF TAB
400.0000 ug | ORAL_TABLET | Freq: Every day | ORAL | Status: DC
  Administered 2012-01-25: 0.5 mg via ORAL
  Filled 2012-01-24: qty 1

## 2012-01-24 MED ORDER — GUANFACINE HCL 2 MG PO TABS
2.0000 mg | ORAL_TABLET | Freq: Every day | ORAL | Status: DC
  Administered 2012-01-25: 2 mg via ORAL
  Filled 2012-01-24 (×2): qty 1

## 2012-01-24 MED ORDER — SODIUM CHLORIDE 0.9 % IJ SOLN
3.0000 mL | Freq: Two times a day (BID) | INTRAMUSCULAR | Status: DC
  Administered 2012-01-25: 3 mL via INTRAVENOUS

## 2012-01-24 MED ORDER — LATANOPROST 0.005 % OP SOLN
1.0000 [drp] | Freq: Every day | OPHTHALMIC | Status: DC
  Administered 2012-01-25: 1 [drp] via OPHTHALMIC
  Filled 2012-01-24: qty 2.5

## 2012-01-24 MED ORDER — VITAMIN D3 25 MCG (1000 UNIT) PO TABS
2000.0000 [IU] | ORAL_TABLET | Freq: Every day | ORAL | Status: DC
  Administered 2012-01-25: 2000 [IU] via ORAL
  Filled 2012-01-24: qty 2

## 2012-01-24 NOTE — ED Notes (Signed)
Pt is still in CT
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
## 2012-01-24 NOTE — ED Notes (Signed)
Son's name is Ivin Booty, phone # [phone]

## 2012-01-25 ENCOUNTER — Observation Stay (HOSPITAL_COMMUNITY): Payer: No Typology Code available for payment source

## 2012-01-25 ENCOUNTER — Encounter (HOSPITAL_COMMUNITY): Payer: Self-pay | Admitting: *Deleted

## 2012-01-25 DIAGNOSIS — M25569 Pain in unspecified knee: Secondary | ICD-10-CM | POA: Diagnosis not present

## 2012-01-25 DIAGNOSIS — S7010XA Contusion of unspecified thigh, initial encounter: Secondary | ICD-10-CM | POA: Diagnosis not present

## 2012-01-25 DIAGNOSIS — M7989 Other specified soft tissue disorders: Secondary | ICD-10-CM | POA: Diagnosis not present

## 2012-01-25 DIAGNOSIS — M25469 Effusion, unspecified knee: Secondary | ICD-10-CM | POA: Diagnosis not present

## 2012-01-25 DIAGNOSIS — S82009A Unspecified fracture of unspecified patella, initial encounter for closed fracture: Secondary | ICD-10-CM | POA: Diagnosis not present

## 2012-01-25 DIAGNOSIS — S27329A Contusion of lung, unspecified, initial encounter: Secondary | ICD-10-CM | POA: Diagnosis not present

## 2012-01-25 DIAGNOSIS — R0789 Other chest pain: Secondary | ICD-10-CM | POA: Diagnosis not present

## 2012-01-25 DIAGNOSIS — J984 Other disorders of lung: Secondary | ICD-10-CM | POA: Diagnosis not present

## 2012-01-25 LAB — BASIC METABOLIC PANEL
CO2: 27 mEq/L (ref 19–32)
Calcium: 9.1 mg/dL (ref 8.4–10.5)
Chloride: 103 mEq/L (ref 96–112)
Glucose, Bld: 103 mg/dL — ABNORMAL HIGH (ref 70–99)
Potassium: 4.2 mEq/L (ref 3.5–5.1)
Sodium: 137 mEq/L (ref 135–145)

## 2012-01-25 LAB — CBC
HCT: 34.4 % — ABNORMAL LOW (ref 39.0–52.0)
Hemoglobin: 11.3 g/dL — ABNORMAL LOW (ref 13.0–17.0)
MCH: 26.7 pg (ref 26.0–34.0)
MCV: 81.3 fL (ref 78.0–100.0)
Platelets: 125 10*3/uL — ABNORMAL LOW (ref 150–400)
RBC: 4.23 MIL/uL (ref 4.22–5.81)
WBC: 6 10*3/uL (ref 4.0–10.5)

## 2012-01-25 MED ORDER — ASPIRIN 81 MG PO TABS: 81.0000 mg | ORAL_TABLET | Freq: Every day | ORAL | Status: DC

## 2012-01-25 MED ORDER — HYDROCODONE-ACETAMINOPHEN 5-325 MG PO TABS
1.0000 | ORAL_TABLET | ORAL | Status: DC | PRN
Start: 2012-01-25 — End: 2012-02-04

## 2012-01-25 MED ORDER — IOHEXOL 300 MG/ML  SOLN
80.0000 mL | Freq: Once | INTRAMUSCULAR | Status: AC | PRN
  Administered 2012-01-25: 80 mL via INTRAVENOUS
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
## 2012-01-25 NOTE — ED Provider Notes (Signed)
I saw and evaluated the patient, reviewed the resident's note and I agree with the findings and plan.  Medical screening examination/treatment/procedure(s) were conducted as a shared visit with resident and myself.  I personally evaluated the patient during the encounter.  Pt continued to have observed splinting with respirations throughout ER evaluation.  Plan admit.  Tobin Chad, MD 01/25/12 [phone]

## 2012-01-25 NOTE — Progress Notes (Signed)
Orthopedic Tech Progress Note Patient Details:  Lawrence Bailey Jul 03, 1940 [id number]  Ortho Devices Type of Ortho Device: Knee Immobilizer Ortho Device/Splint Location: (L) LE Ortho Device/Splint Interventions: Application   Jennye Moccasin 01/25/2012, 1:30 PM

## 2012-01-26 ENCOUNTER — Telehealth: Payer: Self-pay | Admitting: Orthopedic Surgery
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
## 2012-02-04 ENCOUNTER — Ambulatory Visit (INDEPENDENT_AMBULATORY_CARE_PROVIDER_SITE_OTHER): Payer: 59 | Admitting: Orthopedic Surgery

## 2012-02-04 ENCOUNTER — Encounter (INDEPENDENT_AMBULATORY_CARE_PROVIDER_SITE_OTHER): Payer: Self-pay

## 2012-02-04 VITALS — BP 120/76 | HR 48 | Temp 98.4°F | Ht 71.0 in | Wt 194.8 lb

## 2012-02-04 DIAGNOSIS — S7010XA Contusion of unspecified thigh, initial encounter: Secondary | ICD-10-CM | POA: Diagnosis not present

## 2012-02-04 DIAGNOSIS — S27329A Contusion of lung, unspecified, initial encounter: Secondary | ICD-10-CM | POA: Diagnosis not present

## 2012-02-04 DIAGNOSIS — M25569 Pain in unspecified knee: Secondary | ICD-10-CM | POA: Diagnosis not present

## 2012-02-04 MED ORDER — NAPROXEN 500 MG PO TABS: 500.0000 mg | ORAL_TABLET | Freq: Two times a day (BID) | ORAL | Status: AC

## 2012-02-04 MED ORDER — HYDROCODONE-ACETAMINOPHEN 5-325 MG PO TABS: 1.0000 | ORAL_TABLET | ORAL | Status: AC | PRN

## 2012-02-04 NOTE — Patient Instructions (Signed)
Increase activity as tolerated.
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
## 2012-02-12 DIAGNOSIS — M1710 Unilateral primary osteoarthritis, unspecified knee: Secondary | ICD-10-CM | POA: Diagnosis not present

## 2012-02-18 DIAGNOSIS — M9420 Chondromalacia, unspecified site: Secondary | ICD-10-CM | POA: Diagnosis not present

## 2012-02-18 DIAGNOSIS — IMO0002 Reserved for concepts with insufficient information to code with codable children: Secondary | ICD-10-CM | POA: Diagnosis not present

## 2012-03-22 ENCOUNTER — Emergency Department (INDEPENDENT_AMBULATORY_CARE_PROVIDER_SITE_OTHER)
Admission: EM | Admit: 2012-03-22 | Discharge: 2012-03-22 | Disposition: A | Discharge status: Home / Self Care | Payer: 59 | Source: Home / Self Care | Attending: Emergency Medicine | Admitting: Emergency Medicine

## 2012-03-22 ENCOUNTER — Encounter (HOSPITAL_COMMUNITY): Payer: Self-pay | Admitting: Emergency Medicine

## 2012-03-22 DIAGNOSIS — W57XXXA Bitten or stung by nonvenomous insect and other nonvenomous arthropods, initial encounter: Secondary | ICD-10-CM | POA: Diagnosis not present

## 2012-03-22 DIAGNOSIS — T148 Other injury of unspecified body region: Secondary | ICD-10-CM

## 2012-03-22 MED ORDER — DIPHENHYDRAMINE HCL 25 MG PO TABS: 12.5000 mg | ORAL_TABLET | Freq: Four times a day (QID) | ORAL | Status: DC | PRN

## 2012-03-22 NOTE — ED Provider Notes (Signed)
Medical screening examination/treatment/procedure(s) were performed by non-physician practitioner and as supervising physician I was immediately available for consultation/collaboration.  Leslee Home, M.D.   Reuben Likes, MD 03/22/12 2102

## 2012-03-22 NOTE — ED Notes (Signed)
Reports insect bite.  Unaware how he got bit. Pain on site.

## 2012-03-22 NOTE — ED Provider Notes (Signed)
History     CSN: [id number]  Arrival date & time 03/22/12  1725   First MD Initiated Contact with Patient 03/22/12 1847      Chief Complaint  Patient presents with  . Insect Bite    (Consider location/radiation/quality/duration/timing/severity/associated sxs/prior treatment) The history is provided by the patient.  This patient complains of a insect bite.  Location: back of neck  Onset: 2 hour ago   Course: unchanged Self-treated with: nothing            Improvement with treatment: n/a  History Itching: no  Tenderness: yes Tick/insect exposure: yes   Red Flags Feeling ill: no Fever:no Facial/tongue swelling/difficulty breathing:  no  Diabetic or immunocompromised: no  Past Medical History  Diagnosis Date  . Hypertension   . Cancer   . 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 Renal insufficiency   . H/O malignant gastrointestinal stromal tumor (GIST) 07/20/2011  . Coronary artery disease     Past Surgical History  Procedure Date  . Eye surgery   . Abdominal surgery   . Hernia repair     History reviewed. No pertinent family history.  History  Substance Use Topics  . Smoking status: Never Smoker   . Smokeless tobacco: Not on file  . Alcohol Use: No      Review of Systems  Constitutional: Negative.   Respiratory: Negative.   Cardiovascular: Negative.   Gastrointestinal: Negative.   Musculoskeletal: Negative.   Skin: Negative.     Allergies  Other  Home Medications   Current Outpatient Rx  Name Route Sig Dispense Refill  . ASPIRIN 81 MG PO TABS Oral Take 1 tablet (81 mg total) by mouth daily. 30 tablet     Resume in 5 days  . CHOLECALCIFEROL 400 UNITS PO TABS Oral Take 2,000 Units by mouth daily.    Marland Kitchen DIPHENHYDRAMINE HCL 25 MG PO TABS Oral Take 0.5 tablets (12.5 mg total) by mouth every 6 (six) hours as needed for itching. 30 tablet 0  . DORZOLAMIDE HCL 2 % OP SOLN Both Eyes Place 1 drop  into both eyes 2 (two) times daily.    Marland Kitchen FOLIC ACID 400 MCG PO TABS Oral Take 400 mcg by mouth daily.    Marland Kitchen GUANFACINE HCL 2 MG PO TABS Oral Take 2 mg by mouth at bedtime.    Marland Kitchen LATANOPROST 0.005 % OP SOLN Both Eyes Place 1 drop into both eyes at bedtime.    Marland Kitchen NAPROXEN 500 MG PO TABS Oral Take 1 tablet (500 mg total) by mouth 2 (two) times daily with a meal. 60 tablet 1  . NEBIVOLOL HCL 10 MG PO TABS Oral Take 20 mg by mouth daily.    Marland Kitchen OLMESARTAN-AMLODIPINE-HCTZ 40-10-25 MG PO TABS Oral Take by mouth.    . MULTIVIT DROPS/FLUORIDE/IRON PO Oral Take by mouth.    Marland Kitchen PSEUDOEPHEDRINE-GUAIFENESIN ER 60-600 MG PO TB12 Oral Take 1 tablet by mouth as needed.    . TESTOSTERONE 30 MG/ACT TD SOLN Transdermal Place onto the skin.    Marland Kitchen ZOLPIDEM TARTRATE 10 MG PO TABS Oral Take 10 mg by mouth at bedtime as needed. For sleep      BP 158/90  Pulse 54  Temp 97.9 F (36.6 C) (Oral)  Resp 18  SpO2 100%  Physical Exam  Nursing note and vitals reviewed. Constitutional: He is oriented to person, place, and time. Vital signs are normal. He appears well-developed and well-nourished. He  is active and cooperative.  HENT:  Head: Normocephalic.  Eyes: Conjunctivae normal are normal. Pupils are equal, round, and reactive to light. No scleral icterus.  Neck: Trachea normal. Neck supple.  Cardiovascular: Normal rate, regular rhythm, normal heart sounds and intact distal pulses.   Pulmonary/Chest: Effort normal and breath sounds normal.  Neurological: He is alert and oriented to person, place, and time. No cranial nerve deficit or sensory deficit.  Skin: Skin is warm and dry.     Psychiatric: He has a normal mood and affect. His speech is normal and behavior is normal. Judgment and thought content normal. Cognition and memory are normal.    ED Course  Procedures (including critical care time)  Labs Reviewed - No data to display No results found.   1. Insect bite       MDM  Apply ice, benadryl for  swelling and redness.  RTC if symptoms do not improve or begin to have problems swallowing, breathing or significant change in condition.        Johnsie Kindred, NP 03/22/12 1951

## 2012-03-28 ENCOUNTER — Ambulatory Visit (HOSPITAL_COMMUNITY)
Admission: RE | Admit: 2012-03-28 | Discharge: 2012-03-28 | Disposition: A | Discharge status: Home / Self Care | Payer: 59 | Source: Ambulatory Visit | Attending: Nurse Practitioner | Admitting: Nurse Practitioner

## 2012-03-28 ENCOUNTER — Other Ambulatory Visit (HOSPITAL_BASED_OUTPATIENT_CLINIC_OR_DEPARTMENT_OTHER): Payer: 59 | Admitting: Lab

## 2012-03-28 DIAGNOSIS — Q619 Cystic kidney disease, unspecified: Secondary | ICD-10-CM | POA: Insufficient documentation

## 2012-03-28 DIAGNOSIS — Z8509 Personal history of malignant neoplasm of other digestive organs: Secondary | ICD-10-CM | POA: Insufficient documentation

## 2012-03-28 DIAGNOSIS — N200 Calculus of kidney: Secondary | ICD-10-CM | POA: Insufficient documentation

## 2012-03-28 DIAGNOSIS — I700 Atherosclerosis of aorta: Secondary | ICD-10-CM | POA: Insufficient documentation

## 2012-03-28 DIAGNOSIS — N400 Enlarged prostate without lower urinary tract symptoms: Secondary | ICD-10-CM | POA: Insufficient documentation

## 2012-03-28 DIAGNOSIS — Z9221 Personal history of antineoplastic chemotherapy: Secondary | ICD-10-CM | POA: Insufficient documentation

## 2012-03-28 LAB — COMPREHENSIVE METABOLIC PANEL (CC13)
AST: 16 U/L (ref 5–34)
BUN: 28 mg/dL — ABNORMAL HIGH (ref 7.0–26.0)
CO2: 27 mEq/L (ref 22–29)
Calcium: 10.3 mg/dL (ref 8.4–10.4)
Chloride: 105 mEq/L (ref 98–107)
Creatinine: 1.7 mg/dL — ABNORMAL HIGH (ref 0.7–1.3)
Total Bilirubin: 0.56 mg/dL (ref 0.20–1.20)

## 2012-03-28 LAB — CBC WITH DIFFERENTIAL/PLATELET
Basophils Absolute: 0 10*3/uL (ref 0.0–0.1)
EOS%: 2.3 % (ref 0.0–7.0)
HCT: 38.7 % (ref 38.4–49.9)
HGB: 12.8 g/dL — ABNORMAL LOW (ref 13.0–17.1)
LYMPH%: 19.3 % (ref 14.0–49.0)
MCH: 27.1 pg — ABNORMAL LOW (ref 27.2–33.4)
MCHC: 32.9 g/dL (ref 32.0–36.0)
NEUT%: 70.7 % (ref 39.0–75.0)
Platelets: 166 10*3/uL (ref 140–400)
lymph#: 1.3 10*3/uL (ref 0.9–3.3)

## 2012-03-28 LAB — LACTATE DEHYDROGENASE (CC13): LDH: 170 U/L (ref 125–220)

## 2012-03-30 ENCOUNTER — Telehealth: Payer: Self-pay | Admitting: *Deleted

## 2012-03-30 NOTE — Telephone Encounter (Signed)
Pt notified of negative CT result per Dr. Patsy Lager instructions.

## 2012-03-30 NOTE — Telephone Encounter (Signed)
Message copied by Sabino Snipes on Wed Mar 30, 2012  3:14 PM ------      Message from: Levert Feinstein      Created: Mon Mar 28, 2012  6:58 PM       Call pt CT negative for recurrent GI stromal tumor

## 2012-04-01 ENCOUNTER — Ambulatory Visit: Payer: 59 | Admitting: Oncology

## 2012-04-08 ENCOUNTER — Telehealth: Payer: Self-pay | Admitting: Oncology

## 2012-04-08 ENCOUNTER — Ambulatory Visit (HOSPITAL_BASED_OUTPATIENT_CLINIC_OR_DEPARTMENT_OTHER): Payer: 59 | Admitting: Oncology

## 2012-04-08 VITALS — BP 126/69 | HR 53 | Temp 97.6°F | Resp 20 | Ht 69.0 in | Wt 190.1 lb

## 2012-04-08 DIAGNOSIS — Z8509 Personal history of malignant neoplasm of other digestive organs: Secondary | ICD-10-CM | POA: Diagnosis not present

## 2012-04-08 DIAGNOSIS — N189 Chronic kidney disease, unspecified: Secondary | ICD-10-CM

## 2012-04-08 DIAGNOSIS — I129 Hypertensive chronic kidney disease with stage 1 through stage 4 chronic kidney disease, or unspecified chronic kidney disease: Secondary | ICD-10-CM | POA: Diagnosis not present

## 2012-04-08 NOTE — Telephone Encounter (Signed)
Gave pt appts for 04/11/13 and 04/18/13. Advised schedulers will call to schedule ct scan appt.
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
## 2012-04-13 ENCOUNTER — Telehealth: Payer: Self-pay | Admitting: Oncology

## 2012-04-13 NOTE — Telephone Encounter (Signed)
pt called in as his 04/14/12 lab should have been 04/14/2013 ,corrected    anne

## 2012-04-14 ENCOUNTER — Other Ambulatory Visit: Payer: 59 | Admitting: Lab

## 2013-04-07 DIAGNOSIS — H2510 Age-related nuclear cataract, unspecified eye: Secondary | ICD-10-CM | POA: Diagnosis not present

## 2013-04-07 DIAGNOSIS — Z961 Presence of intraocular lens: Secondary | ICD-10-CM | POA: Diagnosis not present

## 2013-04-07 DIAGNOSIS — H1130 Conjunctival hemorrhage, unspecified eye: Secondary | ICD-10-CM | POA: Diagnosis not present

## 2013-04-07 DIAGNOSIS — H1045 Other chronic allergic conjunctivitis: Secondary | ICD-10-CM | POA: Diagnosis not present

## 2013-04-07 DIAGNOSIS — H5010 Unspecified exotropia: Secondary | ICD-10-CM | POA: Diagnosis not present

## 2013-04-07 DIAGNOSIS — H4011X0 Primary open-angle glaucoma, stage unspecified: Secondary | ICD-10-CM | POA: Diagnosis not present

## 2013-04-11 ENCOUNTER — Other Ambulatory Visit (HOSPITAL_COMMUNITY): Payer: 59

## 2013-04-11 ENCOUNTER — Other Ambulatory Visit: Payer: 59 | Admitting: Lab

## 2013-04-11 ENCOUNTER — Ambulatory Visit (HOSPITAL_COMMUNITY): Payer: 59

## 2013-04-14 ENCOUNTER — Ambulatory Visit (HOSPITAL_COMMUNITY)
Admission: RE | Admit: 2013-04-14 | Discharge: 2013-04-14 | Disposition: A | Discharge status: Home / Self Care | Payer: 59 | Source: Ambulatory Visit | Attending: Oncology | Admitting: Oncology

## 2013-04-14 ENCOUNTER — Telehealth: Payer: Self-pay | Admitting: Oncology

## 2013-04-14 ENCOUNTER — Encounter (INDEPENDENT_AMBULATORY_CARE_PROVIDER_SITE_OTHER): Payer: Self-pay

## 2013-04-14 ENCOUNTER — Other Ambulatory Visit (HOSPITAL_BASED_OUTPATIENT_CLINIC_OR_DEPARTMENT_OTHER): Payer: 59

## 2013-04-14 DIAGNOSIS — E278 Other specified disorders of adrenal gland: Secondary | ICD-10-CM | POA: Diagnosis not present

## 2013-04-14 DIAGNOSIS — I700 Atherosclerosis of aorta: Secondary | ICD-10-CM | POA: Diagnosis not present

## 2013-04-14 DIAGNOSIS — N289 Disorder of kidney and ureter, unspecified: Secondary | ICD-10-CM | POA: Insufficient documentation

## 2013-04-14 DIAGNOSIS — Z8509 Personal history of malignant neoplasm of other digestive organs: Secondary | ICD-10-CM | POA: Diagnosis not present

## 2013-04-14 DIAGNOSIS — N400 Enlarged prostate without lower urinary tract symptoms: Secondary | ICD-10-CM | POA: Diagnosis not present

## 2013-04-14 DIAGNOSIS — Z9221 Personal history of antineoplastic chemotherapy: Secondary | ICD-10-CM | POA: Insufficient documentation

## 2013-04-14 DIAGNOSIS — N200 Calculus of kidney: Secondary | ICD-10-CM | POA: Insufficient documentation

## 2013-04-14 DIAGNOSIS — R0602 Shortness of breath: Secondary | ICD-10-CM | POA: Diagnosis present

## 2013-04-14 DIAGNOSIS — N281 Cyst of kidney, acquired: Secondary | ICD-10-CM | POA: Diagnosis not present

## 2013-04-14 DIAGNOSIS — K449 Diaphragmatic hernia without obstruction or gangrene: Secondary | ICD-10-CM | POA: Diagnosis not present

## 2013-04-14 LAB — COMPREHENSIVE METABOLIC PANEL (CC13)
ALT: 18 U/L (ref 0–55)
AST: 25 U/L (ref 5–34)
Albumin: 3.9 g/dL (ref 3.5–5.0)
Alkaline Phosphatase: 53 U/L (ref 40–150)
Anion Gap: 11 meq/L (ref 3–11)
BUN: 28.9 mg/dL — ABNORMAL HIGH (ref 7.0–26.0)
CO2: 24 meq/L (ref 22–29)
Calcium: 9.8 mg/dL (ref 8.4–10.4)
Chloride: 105 meq/L (ref 98–109)
Creatinine: 1.8 mg/dL — ABNORMAL HIGH (ref 0.7–1.3)
Glucose: 101 mg/dL (ref 70–140)
Potassium: 4.6 meq/L (ref 3.5–5.1)
Sodium: 140 meq/L (ref 136–145)
Total Bilirubin: 0.38 mg/dL (ref 0.20–1.20)
Total Protein: 7.6 g/dL (ref 6.4–8.3)

## 2013-04-14 LAB — CBC WITH DIFFERENTIAL/PLATELET
BASO%: 0.8 % (ref 0.0–2.0)
Basophils Absolute: 0 10e3/uL (ref 0.0–0.1)
EOS%: 1.9 % (ref 0.0–7.0)
Eosinophils Absolute: 0.1 10e3/uL (ref 0.0–0.5)
HCT: 37.7 % — ABNORMAL LOW (ref 38.4–49.9)
HGB: 12.1 g/dL — ABNORMAL LOW (ref 13.0–17.1)
LYMPH%: 22.1 % (ref 14.0–49.0)
MCH: 26.6 pg — ABNORMAL LOW (ref 27.2–33.4)
MCHC: 32.1 g/dL (ref 32.0–36.0)
MCV: 83 fL (ref 79.3–98.0)
MONO#: 0.4 10e3/uL (ref 0.1–0.9)
MONO%: 7.1 % (ref 0.0–14.0)
NEUT#: 4 10e3/uL (ref 1.5–6.5)
NEUT%: 68.1 % (ref 39.0–75.0)
Platelets: 144 10e3/uL (ref 140–400)
RBC: 4.54 10e6/uL (ref 4.20–5.82)
RDW: 14.4 % (ref 11.0–14.6)
WBC: 5.9 10e3/uL (ref 4.0–10.3)
lymph#: 1.3 10e3/uL (ref 0.9–3.3)

## 2013-04-14 LAB — LACTATE DEHYDROGENASE (CC13): LDH: 195 U/L (ref 125–245)

## 2013-04-14 NOTE — Telephone Encounter (Signed)
pt walked in req printed NOV cal shh

## 2013-04-18 ENCOUNTER — Ambulatory Visit (HOSPITAL_BASED_OUTPATIENT_CLINIC_OR_DEPARTMENT_OTHER): Payer: 59 | Admitting: Oncology

## 2013-04-18 ENCOUNTER — Telehealth: Payer: Self-pay | Admitting: Oncology

## 2013-04-18 VITALS — BP 137/74 | HR 55 | Temp 97.6°F | Resp 20 | Ht 69.0 in | Wt 194.0 lb

## 2013-04-18 DIAGNOSIS — Z8509 Personal history of malignant neoplasm of other digestive organs: Secondary | ICD-10-CM

## 2013-04-18 DIAGNOSIS — D631 Anemia in chronic kidney disease: Secondary | ICD-10-CM | POA: Diagnosis not present

## 2013-04-18 DIAGNOSIS — I10 Essential (primary) hypertension: Secondary | ICD-10-CM | POA: Diagnosis not present

## 2013-04-18 DIAGNOSIS — N189 Chronic kidney disease, unspecified: Secondary | ICD-10-CM

## 2013-04-18 NOTE — Telephone Encounter (Signed)
worked 11/25 POF appt made w Dr Jeani Hawking for 12/10 at 9:30 am [address] [place] in [facility] at [phone].  CAL and AVS printed shh

## 2013-04-19 ENCOUNTER — Telehealth: Payer: Self-pay | Admitting: *Deleted

## 2013-04-19 NOTE — Progress Notes (Signed)
Hematology and Oncology Follow Up Visit  Lawrence Bailey [id number] 06-03-1940 72 y.o. 04/19/2013 7:42 PM   Principle Diagnosis: Encounter Diagnosis  Name Primary?  . H/O malignant gastrointestinal stromal tumor (GIST) Yes     Interim History:  Followup visit for this pleasant 72 year old man diagnosed with a GI stromal tumor of the gastric fundus in November 2008. He presented with a 2 to 62-month history of vague epigastric discomfort and was found to have a microcytic anemia. Upper GI series showed a filling defect in the fundus of the stomach along the lesser curvature. He underwent partial gastrectomy on 05/16/2007. Pathology showed a 6 cm GI stromal tumor. No vascular or lymphatic invasion. All surgical margins negative. Lymph nodes not sampled. Tumor confined to the submucosa. Mitotic activity was moderate to high with 25-50 mitoses per high-power microscope field. He completed Gleevec 400 mg daily for one year. He was treated before we had  mature clinical trial data showing that 2 years of adjuvant therapy with superior to one year.  I have been following him with clinical exams and serial CT scans. To date, there has been no sign of new disease including CT scan done in anticipation of today's visit on 04/14/2013 which I personally reviewed.  Appetite is good. Weight is steady. He denies any abdominal pain. No change in bowel habits. No hematochezia or melena.   Medications: reviewed  Allergies:  Allergies  Allergen Reactions  . Other     Bee sting     Review of Systems: ENT ROS: No sore throat Respiratory ROS: No cough or dyspnea Cardiovascular ROS:   No chest pain or palpitations Gastrointestinal ROS:  See above Genito-Urinary ROS: No urinary tract symptoms Musculoskeletal ROS: No muscle bone or joint pain Neurological ROS: No headache or change in vision Dermatological ROS: No rash or ecchymosis Remaining ROS negative.  Physical Exam: Blood pressure 137/74, pulse  55, temperature 97.6 F (36.4 C), temperature source Oral, resp. rate 20, height 5\' 9"  (1.753 m), weight 194 lb (87.998 kg). Wt Readings from Last 3 Encounters:  04/18/13 194 lb (87.998 kg)  04/08/12 190 lb 1.6 oz (86.229 kg)  02/04/12 194 lb 12.8 oz (88.361 kg)     General appearance: Pleasant African American man HENNT: Pharynx no erythema, exudate, mass, or ulcer. No thyromegaly or thyroid nodules. Large scar left cheek Lymph nodes: No cervical, supraclavicular, or axillary lymphadenopathy Breasts: Lungs: Clear to auscultation, resonant to percussion throughout Heart: Regular rhythm, no murmur, no gallop, no rub, no click, no edema Abdomen: Soft, nontender, normal bowel sounds, no mass, no organomegaly Extremities: No edema, no calf tenderness Musculoskeletal: no joint deformities GU:  Vascular: Carotid pulses 2+, no bruits,  Neurologic: Alert, oriented, PERRLA,  , cranial nerves grossly normal, motor strength 5 over 5, reflexes 1+ symmetric, upper body coordination normal, gait normal, Skin: No rash or ecchymosis  Lab Results: CBC W/Diff    Component Value Date/Time   WBC 5.9 04/14/2013 0913   WBC 6.0 01/25/2012 0555   RBC 4.54 04/14/2013 0913   RBC 4.23 01/25/2012 0555   HGB 12.1* 04/14/2013 0913   HGB 11.3* 01/25/2012 0555   HCT 37.7* 04/14/2013 0913   HCT 34.4* 01/25/2012 0555   PLT 144 04/14/2013 0913   PLT 125* 01/25/2012 0555   MCV 83.0 04/14/2013 0913   MCV 81.3 01/25/2012 0555   MCH 26.6* 04/14/2013 0913   MCH 26.7 01/25/2012 0555   MCHC 32.1 04/14/2013 0913   MCHC 32.8 01/25/2012 0555  RDW 14.4 04/14/2013 0913   RDW 13.3 01/25/2012 0555   LYMPHSABS 1.3 04/14/2013 0913   LYMPHSABS 1.7 01/24/2012 1707   MONOABS 0.4 04/14/2013 0913   MONOABS 0.4 01/24/2012 1707   EOSABS 0.1 04/14/2013 0913   EOSABS 0.1 01/24/2012 1707   BASOSABS 0.0 04/14/2013 0913   BASOSABS 0.0 01/24/2012 1707     Chemistry      Component Value Date/Time   NA 140 04/14/2013 0914   NA 137 01/25/2012 0555    NA 139 01/06/2011 1024   K 4.6 04/14/2013 0914   K 4.2 01/25/2012 0555   K 4.7 01/06/2011 1024   CL 105 03/28/2012 0804   CL 103 01/25/2012 0555   CL 98 01/06/2011 1024   CO2 24 04/14/2013 0914   CO2 27 01/25/2012 0555   CO2 29 01/06/2011 1024   BUN 28.9* 04/14/2013 0914   BUN 23 01/25/2012 0555   BUN 30* 01/06/2011 1024   CREATININE 1.8* 04/14/2013 0914   CREATININE 1.73* 01/25/2012 0555   CREATININE 1.8* 01/06/2011 1024      Component Value Date/Time   CALCIUM 9.8 04/14/2013 0914   CALCIUM 9.1 01/25/2012 0555   CALCIUM 9.3 01/06/2011 1024   ALKPHOS 53 04/14/2013 0914   ALKPHOS 45 07/15/2011 0844   ALKPHOS 57 01/06/2011 1024   AST 25 04/14/2013 0914   AST 17 07/15/2011 0844   AST 29 01/06/2011 1024   ALT 18 04/14/2013 0914   ALT 15 07/15/2011 0844   ALT 16 01/06/2011 1024   BILITOT 0.38 04/14/2013 0914   BILITOT 0.5 07/15/2011 0844   BILITOT 0.80 01/06/2011 1024       Radiological Studies: Ct Abdomen Pelvis Wo Contrast  04/14/2013   CLINICAL DATA:  GI stromal tumor 2008/2009 with oral chemotherapy completed in 2010. Small wall resection. Right inguinal hernia repair. Renal insufficiency.  EXAM: CT ABDOMEN AND PELVIS WITHOUT CONTRAST  TECHNIQUE: Multidetector CT imaging of the abdomen and pelvis was performed following the standard protocol without intravenous contrast.  COMPARISON:  03/28/2012  FINDINGS: Lower Chest: Clear lung bases. Mild cardiomegaly. Tiny hiatal hernia.  Abdomen/Pelvis: Normal infused appearance of the liver. A splenule. Normal pancreas, gallbladder, biliary tract. Surgical changes about the proximal stomach.  Mild left adrenal thickening is chronic. Right adrenal not well visualized secondary to an adjacent right renal cyst.  Left nephrolithiasis. An upper pole right renal 5.8 cm fluid density lesion is most consistent with a minimally complex cyst; septation suggested on coronal reformats. . Interpolar left renal 4.5 cm lesion which is consistent with a cyst. There is also a  smaller left renal lesion which is likely a cyst in the lower pole.  Aortic and branch vessel atherosclerosis. Small porta hepatis nodes, without adenopathy.  No retroperitoneal or retrocrural adenopathy. Normal colon, appendix, Normal terminal ileum and appendix. Normal small bowel without abdominal ascites. No evidence of omental or peritoneal disease.  No pelvic adenopathy. Normal urinary bladder. Moderate prostatomegaly. No significant free fluid.  Bones/Musculoskeletal: Scattered pelvic bone islands which are similar.  IMPRESSION: No acute process or evidence of metastatic disease in the abdomen or pelvis.   Electronically Signed   By: Jeronimo Greaves M.D.   On: 04/14/2013 10:47   Dg Chest 2 View  04/14/2013   CLINICAL DATA:  Shortness of breath with exertion, prior GIST resection  EXAM: CHEST  2 VIEW  COMPARISON:  Chest CT dated 03/28/2012  FINDINGS: Lungs are clear. No pleural effusion or pneumothorax.  The heart is normal in size.  Mild degenerative changes of the visualized thoracolumbar spine.  IMPRESSION: No evidence of acute cardiopulmonary disease.   Electronically Signed   By: Charline Bills M.D.   On: 04/14/2013 09:54    Impression:   #1. High risk, a GI stromal tumor of the gastric fundus treated as outlined above. He remains free of any obvious recurrence now out 6 years from diagnosis.  Plan: I will continue annual clinical exam and CT scans  I will transfer his care to Dr. Truett Perna next year   #2. Essential hypertension   #3. Chronic renal insufficiency  #4. Mild anemia secondary to #3    CC: Patient Care Team: Pola Corn, MD as PCP - General (Cardiology)   Levert Feinstein, MD 11/26/20147:42 PM

## 2013-04-19 NOTE — Telephone Encounter (Signed)
Notified pt of good CT & CXR reports per Dr Patsy Lager instructions.

## 2013-04-19 NOTE — Telephone Encounter (Signed)
Message copied by Sabino Snipes on Wed Apr 19, 2013  2:17 PM ------      Message from: KONNER, SAIZ      Created: Sat Apr 15, 2013  2:41 PM       Call pt  CT abdomen & CXR normal ------

## 2013-05-03 DIAGNOSIS — R079 Chest pain, unspecified: Secondary | ICD-10-CM | POA: Diagnosis not present

## 2013-05-03 DIAGNOSIS — R059 Cough, unspecified: Secondary | ICD-10-CM | POA: Diagnosis not present

## 2013-05-03 DIAGNOSIS — R1310 Dysphagia, unspecified: Secondary | ICD-10-CM | POA: Diagnosis not present

## 2013-05-03 DIAGNOSIS — R05 Cough: Secondary | ICD-10-CM | POA: Diagnosis not present

## 2013-06-07 DIAGNOSIS — H902 Conductive hearing loss, unspecified: Secondary | ICD-10-CM | POA: Diagnosis not present

## 2013-06-07 DIAGNOSIS — H6120 Impacted cerumen, unspecified ear: Secondary | ICD-10-CM | POA: Diagnosis not present

## 2013-06-07 DIAGNOSIS — J029 Acute pharyngitis, unspecified: Secondary | ICD-10-CM | POA: Diagnosis not present

## 2013-06-07 DIAGNOSIS — J310 Chronic rhinitis: Secondary | ICD-10-CM | POA: Diagnosis not present

## 2013-06-28 ENCOUNTER — Other Ambulatory Visit (HOSPITAL_COMMUNITY): Payer: Self-pay | Admitting: Cardiology

## 2013-06-28 DIAGNOSIS — R079 Chest pain, unspecified: Secondary | ICD-10-CM

## 2013-06-28 DIAGNOSIS — R001 Bradycardia, unspecified: Secondary | ICD-10-CM

## 2013-06-28 DIAGNOSIS — R0789 Other chest pain: Secondary | ICD-10-CM

## 2013-07-06 ENCOUNTER — Ambulatory Visit (HOSPITAL_COMMUNITY)
Admission: RE | Admit: 2013-07-06 | Discharge: 2013-07-06 | Disposition: A | Discharge status: Home / Self Care | Payer: 59 | Source: Ambulatory Visit | Attending: Cardiology | Admitting: Cardiology

## 2013-07-06 ENCOUNTER — Encounter (HOSPITAL_COMMUNITY)
Admission: RE | Admit: 2013-07-06 | Discharge: 2013-07-06 | Disposition: A | Discharge status: Home / Self Care | Payer: 59 | Source: Ambulatory Visit | Attending: Cardiology | Admitting: Cardiology

## 2013-07-06 ENCOUNTER — Ambulatory Visit (HOSPITAL_COMMUNITY)
Admission: RE | Admit: 2013-07-06 | Discharge: 2013-07-06 | Disposition: A | Discharge status: Home / Self Care | Payer: 59 | Source: Ambulatory Visit | Attending: Internal Medicine | Admitting: Internal Medicine

## 2013-07-06 ENCOUNTER — Other Ambulatory Visit: Payer: Self-pay

## 2013-07-06 DIAGNOSIS — R079 Chest pain, unspecified: Secondary | ICD-10-CM | POA: Insufficient documentation

## 2013-07-06 DIAGNOSIS — I517 Cardiomegaly: Secondary | ICD-10-CM | POA: Insufficient documentation

## 2013-07-06 DIAGNOSIS — I379 Nonrheumatic pulmonary valve disorder, unspecified: Secondary | ICD-10-CM | POA: Insufficient documentation

## 2013-07-06 DIAGNOSIS — R072 Precordial pain: Secondary | ICD-10-CM | POA: Diagnosis not present

## 2013-07-06 DIAGNOSIS — I359 Nonrheumatic aortic valve disorder, unspecified: Secondary | ICD-10-CM | POA: Diagnosis not present

## 2013-07-06 DIAGNOSIS — R0789 Other chest pain: Secondary | ICD-10-CM

## 2013-07-06 DIAGNOSIS — R001 Bradycardia, unspecified: Secondary | ICD-10-CM

## 2013-07-06 MED ORDER — REGADENOSON 0.4 MG/5ML IV SOLN
INTRAVENOUS | Status: AC
  Filled 2013-07-06: qty 5

## 2013-07-06 MED ORDER — REGADENOSON 0.4 MG/5ML IV SOLN
0.4000 mg | Freq: Once | INTRAVENOUS | Status: AC
  Administered 2013-07-06: 0.4 mg via INTRAVENOUS

## 2013-07-06 NOTE — Progress Notes (Signed)
  Echocardiogram 2D Echocardiogram has been performed.  Basilia Jumbo 07/06/2013, 3:50 PM

## 2014-01-08 DIAGNOSIS — N529 Male erectile dysfunction, unspecified: Secondary | ICD-10-CM | POA: Diagnosis not present

## 2014-01-12 DIAGNOSIS — R972 Elevated prostate specific antigen [PSA]: Secondary | ICD-10-CM | POA: Diagnosis not present

## 2014-01-12 DIAGNOSIS — N529 Male erectile dysfunction, unspecified: Secondary | ICD-10-CM | POA: Diagnosis not present

## 2014-04-10 ENCOUNTER — Ambulatory Visit (HOSPITAL_COMMUNITY)
Admission: RE | Admit: 2014-04-10 | Discharge: 2014-04-10 | Disposition: A | Discharge status: Home / Self Care | Payer: 59 | Source: Ambulatory Visit | Attending: Oncology | Admitting: Oncology

## 2014-04-10 ENCOUNTER — Other Ambulatory Visit (HOSPITAL_BASED_OUTPATIENT_CLINIC_OR_DEPARTMENT_OTHER): Payer: 59

## 2014-04-10 DIAGNOSIS — Z9049 Acquired absence of other specified parts of digestive tract: Secondary | ICD-10-CM | POA: Insufficient documentation

## 2014-04-10 DIAGNOSIS — I7090 Unspecified atherosclerosis: Secondary | ICD-10-CM | POA: Diagnosis not present

## 2014-04-10 DIAGNOSIS — N400 Enlarged prostate without lower urinary tract symptoms: Secondary | ICD-10-CM | POA: Insufficient documentation

## 2014-04-10 DIAGNOSIS — Z8500 Personal history of malignant neoplasm of unspecified digestive organ: Secondary | ICD-10-CM

## 2014-04-10 DIAGNOSIS — Z9221 Personal history of antineoplastic chemotherapy: Secondary | ICD-10-CM | POA: Diagnosis not present

## 2014-04-10 DIAGNOSIS — R079 Chest pain, unspecified: Secondary | ICD-10-CM | POA: Diagnosis not present

## 2014-04-10 DIAGNOSIS — N200 Calculus of kidney: Secondary | ICD-10-CM | POA: Insufficient documentation

## 2014-04-10 DIAGNOSIS — D481 Neoplasm of uncertain behavior of connective and other soft tissue: Secondary | ICD-10-CM | POA: Diagnosis not present

## 2014-04-10 DIAGNOSIS — Z8509 Personal history of malignant neoplasm of other digestive organs: Secondary | ICD-10-CM

## 2014-04-10 LAB — LACTATE DEHYDROGENASE (CC13): LDH: 205 U/L (ref 125–245)

## 2014-04-10 LAB — COMPREHENSIVE METABOLIC PANEL (CC13)
ALBUMIN: 4 g/dL (ref 3.5–5.0)
ALK PHOS: 49 U/L (ref 40–150)
ALT: 19 U/L (ref 0–55)
AST: 23 U/L (ref 5–34)
Anion Gap: 9 mEq/L (ref 3–11)
BUN: 20.6 mg/dL (ref 7.0–26.0)
CALCIUM: 9.8 mg/dL (ref 8.4–10.4)
CHLORIDE: 104 meq/L (ref 98–109)
CO2: 28 mEq/L (ref 22–29)
Creatinine: 1.5 mg/dL — ABNORMAL HIGH (ref 0.7–1.3)
GLUCOSE: 106 mg/dL (ref 70–140)
POTASSIUM: 4 meq/L (ref 3.5–5.1)
SODIUM: 141 meq/L (ref 136–145)
TOTAL PROTEIN: 7.6 g/dL (ref 6.4–8.3)
Total Bilirubin: 0.62 mg/dL (ref 0.20–1.20)

## 2014-04-10 LAB — CBC WITH DIFFERENTIAL/PLATELET
BASO%: 1 % (ref 0.0–2.0)
Basophils Absolute: 0.1 10*3/uL (ref 0.0–0.1)
EOS ABS: 0.1 10*3/uL (ref 0.0–0.5)
EOS%: 2.1 % (ref 0.0–7.0)
HCT: 41.2 % (ref 38.4–49.9)
HEMOGLOBIN: 13 g/dL (ref 13.0–17.1)
LYMPH#: 1.3 10*3/uL (ref 0.9–3.3)
LYMPH%: 21.2 % (ref 14.0–49.0)
MCH: 25.5 pg — ABNORMAL LOW (ref 27.2–33.4)
MCHC: 31.5 g/dL — ABNORMAL LOW (ref 32.0–36.0)
MCV: 80.9 fL (ref 79.3–98.0)
MONO#: 0.4 10*3/uL (ref 0.1–0.9)
MONO%: 6.8 % (ref 0.0–14.0)
NEUT%: 68.9 % (ref 39.0–75.0)
NEUTROS ABS: 4.1 10*3/uL (ref 1.5–6.5)
Platelets: 147 10*3/uL (ref 140–400)
RBC: 5.09 10*6/uL (ref 4.20–5.82)
RDW: 14.7 % — AB (ref 11.0–14.6)
WBC: 6 10*3/uL (ref 4.0–10.3)

## 2014-04-12 ENCOUNTER — Ambulatory Visit: Payer: 59 | Admitting: Oncology

## 2014-04-12 ENCOUNTER — Ambulatory Visit (HOSPITAL_BASED_OUTPATIENT_CLINIC_OR_DEPARTMENT_OTHER): Payer: 59 | Admitting: Nurse Practitioner

## 2014-04-12 ENCOUNTER — Other Ambulatory Visit: Payer: 59 | Admitting: Lab

## 2014-04-12 ENCOUNTER — Telehealth: Payer: Self-pay | Admitting: Nurse Practitioner

## 2014-04-12 VITALS — BP 160/85 | HR 52 | Temp 98.6°F | Resp 20 | Ht 69.0 in | Wt 192.8 lb

## 2014-04-12 DIAGNOSIS — Z8509 Personal history of malignant neoplasm of other digestive organs: Secondary | ICD-10-CM

## 2014-04-12 DIAGNOSIS — Z8500 Personal history of malignant neoplasm of unspecified digestive organ: Secondary | ICD-10-CM

## 2014-04-12 NOTE — Telephone Encounter (Signed)
Gave avs & cal for Nov 2016. °

## 2014-04-12 NOTE — Progress Notes (Signed)
  Lawrence Bailey OFFICE PROGRESS NOTE   Diagnosis:  Gastrointestinal stromal tumor  INTERVAL HISTORY:   Lawrence Bailey returns as scheduled. He feels well. He denies abdominal pain. No change in bowel habits. No bloody or black stools. He denies nausea/vomiting. No shortness of breath. He has a good appetite. He is exercising on a regular basis.  Objective:  Vital signs in last 24 hours:  Blood pressure 160/85, pulse 52, temperature 98.6 F (37 C), temperature source Oral, resp. rate 20, height 5\' 9"  (1.753 m), weight 192 lb 12.8 oz (87.454 kg), SpO2 100 %.    HEENT: no thrush or ulcers. Lymphatics: no palpable cervical, supraclavicular, axillary or inguinal lymph nodes. Resp: lungs clear bilaterally. Cardio: regular rate and rhythm. GI: abdomen is soft and nontender. No mass. No organomegaly. Vascular: no leg edema.    Lab Results:  Lab Results  Component Value Date   WBC 6.0 04/10/2014   HGB 13.0 04/10/2014   HCT 41.2 04/10/2014   MCV 80.9 04/10/2014   PLT 147 04/10/2014   NEUTROABS 4.1 04/10/2014    Imaging:  No results found.  Medications: I have reviewed the patient's current medications.  Assessment/Plan: 1. 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 Pathology showed a 6 cm GI stromal tumor. No vascular or lymphatic invasion. All surgical margins negative. Lymph nodes not sampled. Tumor confined to the submucosa. Mitotic activity 25-50 mitoses per high-power field. He completed 1 year of Gleevec 400 mg daily. Restaging CT abdomen/pelvis 04/10/2014 with no evidence of recurrent/metastatic disease.   Disposition: Lawrence Bailey appears well. He remains in remission from the GI stromal tumor. He will return for an office visit in one year. He will contact the office in the interim with any problems.  Plan reviewed with Dr. Benay Spice.    Ned Card ANP/GNP-BC   04/12/2014  12:04  PM

## 2014-05-31 DIAGNOSIS — H2511 Age-related nuclear cataract, right eye: Secondary | ICD-10-CM | POA: Diagnosis not present

## 2014-05-31 DIAGNOSIS — Z961 Presence of intraocular lens: Secondary | ICD-10-CM | POA: Diagnosis not present

## 2014-05-31 DIAGNOSIS — H4011X3 Primary open-angle glaucoma, severe stage: Secondary | ICD-10-CM | POA: Diagnosis not present

## 2014-07-11 DIAGNOSIS — R972 Elevated prostate specific antigen [PSA]: Secondary | ICD-10-CM | POA: Diagnosis not present

## 2014-07-25 DIAGNOSIS — R972 Elevated prostate specific antigen [PSA]: Secondary | ICD-10-CM | POA: Diagnosis not present

## 2014-07-25 DIAGNOSIS — N400 Enlarged prostate without lower urinary tract symptoms: Secondary | ICD-10-CM | POA: Diagnosis not present

## 2015-04-11 ENCOUNTER — Telehealth: Payer: Self-pay | Admitting: Oncology

## 2015-04-11 ENCOUNTER — Ambulatory Visit (HOSPITAL_BASED_OUTPATIENT_CLINIC_OR_DEPARTMENT_OTHER): Payer: 59 | Admitting: Oncology

## 2015-04-11 VITALS — BP 137/85 | HR 56 | Temp 98.2°F | Resp 17 | Ht 69.0 in | Wt 192.6 lb

## 2015-04-11 DIAGNOSIS — Z8509 Personal history of malignant neoplasm of other digestive organs: Secondary | ICD-10-CM | POA: Diagnosis not present

## 2015-04-11 NOTE — Telephone Encounter (Signed)
per pof to sch pt appt-gave pt copy of avs °

## 2015-04-11 NOTE — Progress Notes (Signed)
  [facility] OFFICE PROGRESS NOTE   Diagnosis: Gastrointestinal stromal tumor  INTERVAL HISTORY:   [name] returns as scheduled. He reports mild malaise. Good appetite. No nausea. Mild constipation relieved with a stool softener  Objective:  Vital signs in last 24 hours:  Blood pressure 137/85, pulse 56, temperature 98.2 F (36.8 C), temperature source Oral, resp. rate 17, height 5\' 9"  (1.753 m), weight 192 lb 9.6 oz (87.363 kg), SpO2 100 %.    HEENT: Neck without mass Lymphatics: No cervical, supraclavicular, axillary, or inguinal nodes Resp: Lungs clear bilaterally Cardio: Regular rate and rhythm GI: No hepatomegaly, no mass, nontender Vascular: No leg edema   Medications: I have reviewed the patient's current medications.  Assessment/Plan: 1. 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 Pathology showed a 6 cm GI stromal tumor. No vascular or lymphatic invasion. All surgical margins negative. Lymph nodes not sampled. Tumor confined to the submucosa. Mitotic activity 25-50 mitoses per high-power field. He completed 1 year of Gleevec 400 mg daily. Restaging CT abdomen/pelvis 04/10/2014 with no evidence of recurrent/metastatic disease.     Disposition:  [name] remains in clinical remission from the gastrointestinal stromal tumor. He would like to continue follow-up at the [facility]. He will return for an office visit in one year. He will contact us in the interim for new symptoms.  Betsy Coder, MD  04/11/2015  9:41 AM

## 2016-04-10 ENCOUNTER — Ambulatory Visit (HOSPITAL_BASED_OUTPATIENT_CLINIC_OR_DEPARTMENT_OTHER): Payer: 59 | Admitting: Nurse Practitioner

## 2016-04-10 VITALS — BP 136/77 | HR 52 | Temp 98.2°F | Resp 17 | Ht 69.0 in | Wt 199.9 lb

## 2016-04-10 DIAGNOSIS — Z8509 Personal history of malignant neoplasm of other digestive organs: Secondary | ICD-10-CM | POA: Diagnosis not present

## 2016-04-10 NOTE — Progress Notes (Signed)
  [facility] OFFICE PROGRESS NOTE   Diagnosis:  Gastrointestinal stromal tumor  INTERVAL HISTORY:   [name] returns as scheduled. He overall feels well. No interim illnesses or infections. He has occasional constipation relieved with a stool softener. No nausea or vomiting. No abdominal pain. He reports a good appetite.  Objective:  Vital signs in last 24 hours:  Blood pressure 136/77, pulse (!) 52, temperature 98.2 F (36.8 C), temperature source Oral, resp. rate 17, height 5\' 9"  (1.753 m), weight 199 lb 14.4 oz (90.7 kg), SpO2 100 %.    HEENT: No neck mass. Lymphatics: No palpable cervical, supraclavicular, axillary or inguinal lymph nodes. Resp: Lungs clear bilaterally. Cardio: Regular rate and rhythm. GI: Abdomen soft and nontender. No hepatomegaly. No mass. Vascular: Trace lower leg edema bilaterally.   Lab Results:  Lab Results  Component Value Date   WBC 6.0 04/10/2014   HGB 13.0 04/10/2014   HCT 41.2 04/10/2014   MCV 80.9 04/10/2014   PLT 147 04/10/2014   NEUTROABS 4.1 04/10/2014    Imaging:  No results found.  Medications: I have reviewed the patient's current medications.  Assessment/Plan: 1. 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 Pathology showed a 6 cm GI stromal tumor. No vascular or lymphatic invasion. All surgical margins negative. Lymph nodes not sampled. Tumor confined to the submucosa. Mitotic activity 25-50 mitoses per high-power field. He completed 1 year of Gleevec 400 mg daily. Restaging CT abdomen/pelvis 04/10/2014 with no evidence of recurrent/metastatic disease.   Disposition: [name] remains in clinical remission from the gastrointestinal stromal tumor. He will return for a follow-up visit in one year. He will contact the office in the interim with any problems.    Ned Card ANP/GNP-BC   04/10/2016  9:39 AM

## 2016-04-17 ENCOUNTER — Telehealth: Payer: Self-pay | Admitting: Oncology

## 2016-04-17 NOTE — Telephone Encounter (Signed)
Spoke with patient re November 2018 f/u.

## 2016-06-18 ENCOUNTER — Ambulatory Visit (INDEPENDENT_AMBULATORY_CARE_PROVIDER_SITE_OTHER): Payer: 59

## 2016-06-18 ENCOUNTER — Encounter (INDEPENDENT_AMBULATORY_CARE_PROVIDER_SITE_OTHER): Payer: Self-pay | Admitting: Orthopaedic Surgery

## 2016-06-18 ENCOUNTER — Ambulatory Visit (INDEPENDENT_AMBULATORY_CARE_PROVIDER_SITE_OTHER): Payer: 59 | Admitting: Orthopaedic Surgery

## 2016-06-18 VITALS — BP 145/77 | HR 54 | Ht 71.0 in | Wt 195.0 lb

## 2016-06-18 DIAGNOSIS — Z716 Tobacco abuse counseling: Secondary | ICD-10-CM | POA: Diagnosis not present

## 2016-06-18 DIAGNOSIS — M25521 Pain in right elbow: Secondary | ICD-10-CM

## 2016-06-18 MED ORDER — LIDOCAINE HCL 1 % IJ SOLN
1.0000 mL | INTRAMUSCULAR | Status: AC | PRN
  Administered 2016-06-18: 1 mL

## 2016-06-18 MED ORDER — METHYLPREDNISOLONE ACETATE 40 MG/ML IJ SUSP
40.0000 mg | INTRAMUSCULAR | Status: AC | PRN
  Administered 2016-06-18: 40 mg via INTRA_ARTICULAR

## 2016-06-18 NOTE — Progress Notes (Signed)
Office Visit Note   Patient: Lawrence Bailey           Date of Birth: 10/25/1940           MRN: [id number] Visit Date: 06/18/2016              Requested by: Lawrence Huh, MD [address] [place], [place] [address] PCP: Lawrence Nettle, MD   Assessment & Plan: Visit Diagnoses: Olecranon bursa right elbow   Plan: Aspirate and inject with cortisone-2 mL of bloody fluid obtained, no evidence of infection or gout. Long discussion regarding possible treatment options including surgical excision. We'll monitor per his choice.  Follow-Up Instructions: No Follow-up on file.   Orders:  No orders of the defined types were placed in this encounter.  No orders of the defined types were placed in this encounter.     Procedures: Medium Joint Inj Date/Time: 06/18/2016 2:17 PM Performed by: Lawrence Bailey Authorized by: Lawrence Bailey   Consent Given by:  Patient Timeout: prior to procedure the correct patient, procedure, and site was verified   Indications:  Joint swelling Location:  Elbow Site:  R olecranon bursa Ultrasound Guided: No   Fluoroscopic Guidance: No   Medications:  1 mL lidocaine 1 %; 40 mg methylPREDNISolone acetate 40 MG/ML     Clinical Data: No additional findings.   Subjective: No chief complaint on file.   Pt has a bursa sac on his Right elbow, gradually hs become more swollen and painful.  Pt has also has BIL knee pain and had xrays obtained but pt cannot remember where about 5 months ago. Since we cannot see the xrays from other office, they will bring a disc with BIL knees next visit. Pt likes to walk about 4-5 mils per day but since the BIL knees hurt, he doesn't walk quite as far with his group.  Lawrence Bailey has noted the swelling about the right elbow for at least a year. He denies history of injury or trauma. No history of gout. It has not been particularly painful red or erythematous. He is on aspirin.  Review of  Systems   Objective: Vital Signs: There were no vitals taken for this visit.  Physical Exam  Ortho Exam right elbow with full range of motion of pronation supination flexion and extension .Marland Kitchen A prominent olecranon bursa is present. It was not warm or hot or indurated. No neurovascular exam was intact distally.  Specialty Comments:  No specialty comments available.  Imaging: No results found.   PMFS History: Patient Active Problem List   Diagnosis Date Noted  . Pulmonary contusion 02/04/2012  . Thigh hematoma 02/04/2012  . H/O malignant gastrointestinal stromal tumor (GIST) 07/20/2011   Past Medical History:  Diagnosis Date  . Cancer   . Coronary artery disease   . GIST (gastrointestinal stromal tumor), malignant dx'd 04/2007   gleevac comp 04/2008  . H/O malignant gastrointestinal stromal tumor (GIST) 07/20/2011  . Hypertension   . Renal insufficiency     No family history on file.  Past Surgical History:  Procedure Laterality Date  . ABDOMINAL SURGERY    . EYE SURGERY    . HERNIA REPAIR     Social History   Occupational History  . Not on file.   Social History Main Topics  . Smoking status: Never Smoker  . Smokeless tobacco: Not on file  . Alcohol use No  . Drug use: No  . Sexual activity: Yes

## 2016-06-22 ENCOUNTER — Ambulatory Visit (INDEPENDENT_AMBULATORY_CARE_PROVIDER_SITE_OTHER): Payer: Self-pay | Admitting: Orthopaedic Surgery

## 2016-06-30 DIAGNOSIS — E038 Other specified hypothyroidism: Secondary | ICD-10-CM | POA: Diagnosis not present

## 2016-06-30 DIAGNOSIS — E2749 Other adrenocortical insufficiency: Secondary | ICD-10-CM | POA: Diagnosis not present

## 2016-06-30 DIAGNOSIS — E291 Testicular hypofunction: Secondary | ICD-10-CM | POA: Diagnosis not present

## 2016-07-06 DIAGNOSIS — J189 Pneumonia, unspecified organism: Secondary | ICD-10-CM | POA: Diagnosis not present

## 2016-07-06 DIAGNOSIS — H6123 Impacted cerumen, bilateral: Secondary | ICD-10-CM | POA: Diagnosis not present

## 2016-09-18 DIAGNOSIS — I2510 Atherosclerotic heart disease of native coronary artery without angina pectoris: Secondary | ICD-10-CM | POA: Diagnosis not present

## 2016-09-18 DIAGNOSIS — C169 Malignant neoplasm of stomach, unspecified: Secondary | ICD-10-CM | POA: Diagnosis not present

## 2016-09-18 DIAGNOSIS — I639 Cerebral infarction, unspecified: Secondary | ICD-10-CM | POA: Diagnosis not present

## 2016-09-18 DIAGNOSIS — I10 Essential (primary) hypertension: Secondary | ICD-10-CM | POA: Diagnosis not present

## 2016-09-18 DIAGNOSIS — R001 Bradycardia, unspecified: Secondary | ICD-10-CM | POA: Diagnosis not present

## 2016-10-23 DIAGNOSIS — H04123 Dry eye syndrome of bilateral lacrimal glands: Secondary | ICD-10-CM | POA: Diagnosis not present

## 2016-10-23 DIAGNOSIS — H401133 Primary open-angle glaucoma, bilateral, severe stage: Secondary | ICD-10-CM | POA: Diagnosis not present

## 2016-10-23 DIAGNOSIS — H2511 Age-related nuclear cataract, right eye: Secondary | ICD-10-CM | POA: Diagnosis not present

## 2016-10-23 DIAGNOSIS — H10413 Chronic giant papillary conjunctivitis, bilateral: Secondary | ICD-10-CM | POA: Diagnosis not present

## 2016-10-23 DIAGNOSIS — Z961 Presence of intraocular lens: Secondary | ICD-10-CM | POA: Diagnosis not present

## 2016-11-06 DIAGNOSIS — Z961 Presence of intraocular lens: Secondary | ICD-10-CM | POA: Diagnosis not present

## 2016-11-06 DIAGNOSIS — H04123 Dry eye syndrome of bilateral lacrimal glands: Secondary | ICD-10-CM | POA: Diagnosis not present

## 2016-11-06 DIAGNOSIS — H10413 Chronic giant papillary conjunctivitis, bilateral: Secondary | ICD-10-CM | POA: Diagnosis not present

## 2016-11-06 DIAGNOSIS — H2511 Age-related nuclear cataract, right eye: Secondary | ICD-10-CM | POA: Diagnosis not present

## 2016-11-06 DIAGNOSIS — H401133 Primary open-angle glaucoma, bilateral, severe stage: Secondary | ICD-10-CM | POA: Diagnosis not present

## 2016-11-07 ENCOUNTER — Encounter (HOSPITAL_COMMUNITY): Payer: Self-pay | Admitting: Emergency Medicine

## 2016-11-07 ENCOUNTER — Observation Stay (HOSPITAL_COMMUNITY)
Admission: EM | Admit: 2016-11-07 | Discharge: 2016-11-09 | Disposition: A | Discharge status: Home / Self Care | Payer: Medicare Other | Attending: Internal Medicine | Admitting: Internal Medicine

## 2016-11-07 ENCOUNTER — Emergency Department (HOSPITAL_COMMUNITY): Payer: Medicare Other

## 2016-11-07 DIAGNOSIS — N179 Acute kidney failure, unspecified: Secondary | ICD-10-CM | POA: Diagnosis not present

## 2016-11-07 DIAGNOSIS — I824Z1 Acute embolism and thrombosis of unspecified deep veins of right distal lower extremity: Secondary | ICD-10-CM | POA: Diagnosis present

## 2016-11-07 DIAGNOSIS — R404 Transient alteration of awareness: Secondary | ICD-10-CM | POA: Diagnosis not present

## 2016-11-07 DIAGNOSIS — N183 Chronic kidney disease, stage 3 unspecified: Secondary | ICD-10-CM | POA: Diagnosis present

## 2016-11-07 DIAGNOSIS — Z8500 Personal history of malignant neoplasm of unspecified digestive organ: Secondary | ICD-10-CM | POA: Diagnosis not present

## 2016-11-07 DIAGNOSIS — Z8673 Personal history of transient ischemic attack (TIA), and cerebral infarction without residual deficits: Secondary | ICD-10-CM | POA: Diagnosis not present

## 2016-11-07 DIAGNOSIS — Z79899 Other long term (current) drug therapy: Secondary | ICD-10-CM | POA: Insufficient documentation

## 2016-11-07 DIAGNOSIS — R609 Edema, unspecified: Secondary | ICD-10-CM

## 2016-11-07 DIAGNOSIS — R55 Syncope and collapse: Secondary | ICD-10-CM | POA: Diagnosis not present

## 2016-11-07 DIAGNOSIS — I2510 Atherosclerotic heart disease of native coronary artery without angina pectoris: Secondary | ICD-10-CM | POA: Insufficient documentation

## 2016-11-07 DIAGNOSIS — R739 Hyperglycemia, unspecified: Secondary | ICD-10-CM | POA: Diagnosis present

## 2016-11-07 DIAGNOSIS — I129 Hypertensive chronic kidney disease with stage 1 through stage 4 chronic kidney disease, or unspecified chronic kidney disease: Secondary | ICD-10-CM | POA: Insufficient documentation

## 2016-11-07 DIAGNOSIS — Z7982 Long term (current) use of aspirin: Secondary | ICD-10-CM | POA: Insufficient documentation

## 2016-11-07 DIAGNOSIS — Z86711 Personal history of pulmonary embolism: Secondary | ICD-10-CM | POA: Diagnosis not present

## 2016-11-07 DIAGNOSIS — I10 Essential (primary) hypertension: Secondary | ICD-10-CM | POA: Diagnosis present

## 2016-11-07 DIAGNOSIS — R6889 Other general symptoms and signs: Secondary | ICD-10-CM | POA: Diagnosis not present

## 2016-11-07 HISTORY — DX: Benign prostatic hyperplasia without lower urinary tract symptoms: N40.0

## 2016-11-07 HISTORY — DX: Unspecified glaucoma: H40.9

## 2016-11-07 LAB — CBC
HCT: 39.4 % (ref 39.0–52.0)
Hemoglobin: 12.7 g/dL — ABNORMAL LOW (ref 13.0–17.0)
MCH: 26.4 pg (ref 26.0–34.0)
MCHC: 32.2 g/dL (ref 30.0–36.0)
MCV: 81.9 fL (ref 78.0–100.0)
PLATELETS: 150 10*3/uL (ref 150–400)
RBC: 4.81 MIL/uL (ref 4.22–5.81)
RDW: 14 % (ref 11.5–15.5)
WBC: 8.7 10*3/uL (ref 4.0–10.5)

## 2016-11-07 LAB — URINALYSIS, ROUTINE W REFLEX MICROSCOPIC
BILIRUBIN URINE: NEGATIVE
GLUCOSE, UA: NEGATIVE mg/dL
HGB URINE DIPSTICK: NEGATIVE
KETONES UR: NEGATIVE mg/dL
LEUKOCYTES UA: NEGATIVE
Nitrite: NEGATIVE
PROTEIN: NEGATIVE mg/dL
Specific Gravity, Urine: 1.015 (ref 1.005–1.030)
pH: 6 (ref 5.0–8.0)

## 2016-11-07 LAB — BASIC METABOLIC PANEL
Anion gap: 11 (ref 5–15)
BUN: 20 mg/dL (ref 6–20)
CHLORIDE: 102 mmol/L (ref 101–111)
CO2: 21 mmol/L — ABNORMAL LOW (ref 22–32)
CREATININE: 1.57 mg/dL — AB (ref 0.61–1.24)
Calcium: 9 mg/dL (ref 8.9–10.3)
GFR calc Af Amer: 48 mL/min — ABNORMAL LOW (ref >60)
GFR calc non Af Amer: 41 mL/min — ABNORMAL LOW (ref >60)
Glucose, Bld: 155 mg/dL — ABNORMAL HIGH (ref 65–99)
Potassium: 3.2 mmol/L — ABNORMAL LOW (ref 3.5–5.1)
SODIUM: 134 mmol/L — AB (ref 135–145)

## 2016-11-07 LAB — I-STAT CHEM 8, ED
BUN: 25 mg/dL — ABNORMAL HIGH (ref 6–20)
CALCIUM ION: 1.1 mmol/L — AB (ref 1.15–1.40)
CREATININE: 1.5 mg/dL — AB (ref 0.61–1.24)
Chloride: 103 mmol/L (ref 101–111)
GLUCOSE: 158 mg/dL — AB (ref 65–99)
HEMATOCRIT: 41 % (ref 39.0–52.0)
HEMOGLOBIN: 13.9 g/dL (ref 13.0–17.0)
Potassium: 3.2 mmol/L — ABNORMAL LOW (ref 3.5–5.1)
Sodium: 140 mmol/L (ref 135–145)
TCO2: 22 mmol/L (ref 0–100)

## 2016-11-07 LAB — I-STAT TROPONIN, ED: Troponin i, poc: 0.03 ng/mL (ref 0.00–0.08)

## 2016-11-07 LAB — ETHANOL: Alcohol, Ethyl (B): <5 mg/dL (ref <5)

## 2016-11-07 LAB — CBG MONITORING, ED: Glucose-Capillary: 124 mg/dL — ABNORMAL HIGH (ref 65–99)

## 2016-11-07 MED ORDER — ACETAMINOPHEN 500 MG PO TABS
1000.0000 mg | ORAL_TABLET | Freq: Once | ORAL | Status: DC
  Filled 2016-11-07: qty 2

## 2016-11-07 NOTE — ED Triage Notes (Signed)
Pt brought to ED by [facility] from a restaurant, pt got unresponsive on a restaurant, HR drops to 45 pm, very diaphoretic. HR 65, BP 128/76, CBG 138 on ED arrival, no neuro sx, no pain, SOB or metal status change.

## 2016-11-07 NOTE — ED Provider Notes (Signed)
[facility] DEPT Provider Note   CSN: [id number] Arrival date & time: 11/07/16  1942     History   Chief Complaint Chief Complaint  Patient presents with  . Loss of Consciousness    HPI Lawrence Bailey is a 76 y.o. male.  HPI Patient is a 31 her old male past medical history significant for coronary artery disease, CKD, hypertension, prior CVA, who presents to the emergency Department following an episode of loss of consciousness. Per the patient's wife, he was sitting at dinner and his normal state of health. Started staring off at a plate. Then slumped over in his chair. Was not responsive during this time. Did not note any abnormal movements or seizure like activity. Lasted for approximately 5 minutes. When EMS arrived patient was bradycardic and hypotensive. Spontaneously regained consciousness without any medications. Was back to his normal mental state within a couple of minutes. No tongue biting, did have urinary incontinence. Patient currently denies chest pain, shortness of breath, headache, change in vision, nausea or vomiting, extremity weakness.  Past Medical History:  Diagnosis Date  . Coronary artery disease   . GIST (gastrointestinal stromal tumor), malignant ([facility]) dx'd 04/2007   gleevac comp 04/2008  . Glaucoma   . Hypertension   . Renal insufficiency     Patient Active Problem List   Diagnosis Date Noted  . Syncope 11/08/2016  . Pulmonary contusion 02/04/2012  . Thigh hematoma 02/04/2012  . H/O malignant gastrointestinal stromal tumor (GIST) 07/20/2011    Past Surgical History:  Procedure Laterality Date  . ABDOMINAL SURGERY    . EYE SURGERY    . HERNIA REPAIR    . KNEE SURGERY         Home Medications    Prior to Admission medications   Medication Sig Start Date End Date Taking? Authorizing Provider  aspirin 81 MG tablet Take 1 tablet (81 mg total) by mouth daily. 01/25/12  Yes Saverio Danker, PA-C  BYSTOLIC 20 MG TABS Take 20 mg by mouth  daily. 10/09/16  Yes [provider]  dorzolamide (TRUSOPT) 2 % ophthalmic solution Place 1 drop into both eyes 2 (two) times daily.   Yes [provider]  dorzolamide-timolol (COSOPT) 22.3-6.8 MG/ML ophthalmic solution Place 1 drop into both eyes 2 (two) times daily. 10/23/16  Yes [provider]  latanoprost (XALATAN) 0.005 % ophthalmic solution Place 1 drop into both eyes at bedtime.   Yes [provider]  Olmesartan-Amlodipine-HCTZ (TRIBENZOR) 40-10-25 MG TABS Take 1 tablet by mouth daily.    Yes [provider]  RHOPRESSA 0.02 % SOLN Place 1 drop into the left eye at bedtime. 10/23/16  Yes [provider]    Family History History reviewed. No pertinent family history.  Social History Social History  Substance Use Topics  . Smoking status: Never Smoker  . Smokeless tobacco: Never Used  . Alcohol use 0.6 oz/week    1 Cans of beer per week     Comment: rare     Allergies   Other   Review of Systems Review of Systems  Constitutional: Negative for appetite change and fever.  HENT: Negative for congestion.   Eyes: Negative for visual disturbance.  Respiratory: Negative for chest tightness and shortness of breath.   Cardiovascular: Negative for chest pain and palpitations.  Gastrointestinal: Negative for abdominal pain, blood in stool, nausea and vomiting.  Genitourinary: Negative for decreased urine volume and dysuria.  Musculoskeletal: Negative for back pain.  Skin: Negative for rash.  Neurological: Positive for syncope and headaches. Negative for dizziness, seizures, weakness and light-headedness.  Psychiatric/Behavioral: Negative for behavioral problems.     Physical Exam Updated Vital Signs BP 122/74   Pulse (!) 53   Temp 99.7 F (37.6 C) (Rectal)   Resp 15   Ht 5\' 11"  (1.803 m)   Wt 86.2 kg (190 lb)   SpO2 99%   BMI 26.50 kg/m   Physical Exam  Constitutional: He is oriented to person, place, and time. He  appears well-developed and well-nourished.  HENT:  Head: Atraumatic.  Mouth/Throat: Oropharynx is clear and moist.  Eyes: Conjunctivae and EOM are normal.  Neck: Normal range of motion. Neck supple. No JVD present.  Cardiovascular: Normal rate, regular rhythm, normal heart sounds and intact distal pulses.   Pulmonary/Chest: Effort normal and breath sounds normal. No respiratory distress.  Abdominal: Soft. He exhibits no distension. There is no tenderness.  Musculoskeletal: Normal range of motion.  No unilateral leg swelling  Neurological: He is alert and oriented to person, place, and time.  Skin: Skin is warm. Capillary refill takes less than 2 seconds.  Psychiatric: He has a normal mood and affect.     ED Treatments / Results  Labs (all labs ordered are listed, but only abnormal results are displayed) Labs Reviewed  BASIC METABOLIC PANEL - Abnormal; Notable for the following:       Result Value   Sodium 134 (*)    Potassium 3.2 (*)    CO2 21 (*)    Glucose, Bld 155 (*)    Creatinine, Ser 1.57 (*)    GFR calc non Af Amer 41 (*)    GFR calc Af Amer 48 (*)    All other components within normal limits  CBC - Abnormal; Notable for the following:    Hemoglobin 12.7 (*)    All other components within normal limits  CBG MONITORING, ED - Abnormal; Notable for the following:    Glucose-Capillary 124 (*)    All other components within normal limits  I-STAT CHEM 8, ED - Abnormal; Notable for the following:    Potassium 3.2 (*)    BUN 25 (*)    Creatinine, Ser 1.50 (*)    Glucose, Bld 158 (*)    Calcium, Ion 1.10 (*)    All other components within normal limits  URINALYSIS, ROUTINE W REFLEX MICROSCOPIC  ETHANOL  I-STAT TROPOININ, ED    EKG  EKG Interpretation  Date/Time:  Saturday November 07 2016 19:42:29 EDT Ventricular Rate:  68 PR Interval:    QRS Duration: 96 QT Interval:  429 QTC Calculation: 457 R Axis:   13 Text Interpretation:  Sinus rhythm LVH with secondary  repolarization abnormality Non-specific ST-t changes `ecg similar to old ecg viewable w prior stress test eval Confirmed by Ashok Cordia  MD, Lennette Bihari ([id number]) on 11/07/2016 11:25:09 PM       Radiology Ct Head Wo Contrast  Result Date: 11/07/2016 CLINICAL DATA:  Syncopal episode EXAM: CT HEAD WITHOUT CONTRAST TECHNIQUE: Contiguous axial images were obtained from the base of the skull through the vertex without intravenous contrast. COMPARISON:  01/24/2012 FINDINGS: Brain: No acute territorial infarction, hemorrhage or intracranial mass is seen. Old left sub insular infarct. Moderate atrophy. Mild periventricular small vessel ischemic white matter changes. Stable ventricle size. Vascular: No hyperdense vessels.  Carotid artery calcifications. Skull: No fracture or suspicious bone lesion Sinuses/Orbits: Mucosal thickening in the ethmoid sinuses. No acute orbital abnormality. Other: None IMPRESSION: 1. No CT evidence for acute  intracranial abnormality. 2. Atrophy and small vessel ischemic changes of the white matter. Electronically Signed   By: Donavan Foil M.D.   On: 11/07/2016 23:32    Procedures Procedures (including critical care time)  Medications Ordered in ED Medications  acetaminophen (TYLENOL) tablet 1,000 mg (1,000 mg Oral Not Given 11/07/16 2206)     Initial Impression / Assessment and Plan / ED Course  I have reviewed the triage vital signs and the nursing notes.  Pertinent labs & imaging results that were available during my care of the patient were reviewed by me and considered in my medical decision making (see chart for details).     Patient is a 76 year old male past medical history significant for coronary artery disease, who presents to the emergency Department following a prolonged syncopal episode. When EMS arrived, bradycardic and hypotensive. Quickly back to baseline. No seizure-like activity during loss of consciousness. Patient currently without complaints. No head trauma or  falls.  Patient was initially a code STEMI, deactivated when able to compare EKG to prior. EKG showed nonspecific ST changes diffusely, and nonspecific T-wave abnormalities. LVH. No change when compared to prior EKG. Initial troponin 0.03. CT head showed no acute findings. No leukocytosis, hemoglobin at baseline. No significant electrolyte abnormalities. Creatinine at baseline.  Patient admitted to hospitalist, Dr. Lorin Mercy for further management.  Final Clinical Impressions(s) / ED Diagnoses   Final diagnoses:  Syncope, unspecified syncope type    New Prescriptions New Prescriptions   No medications on file     Nathaniel Man, MD 11/08/16 [id number]    Lajean Saver, MD 11/08/16 [phone]

## 2016-11-07 NOTE — ED Notes (Signed)
Patient back from CT.

## 2016-11-08 ENCOUNTER — Observation Stay (HOSPITAL_COMMUNITY): Payer: Medicare Other

## 2016-11-08 ENCOUNTER — Observation Stay (HOSPITAL_BASED_OUTPATIENT_CLINIC_OR_DEPARTMENT_OTHER): Payer: Medicare Other

## 2016-11-08 ENCOUNTER — Encounter (HOSPITAL_COMMUNITY): Payer: Self-pay | Admitting: Internal Medicine

## 2016-11-08 DIAGNOSIS — R609 Edema, unspecified: Secondary | ICD-10-CM

## 2016-11-08 DIAGNOSIS — N183 Chronic kidney disease, stage 3 unspecified: Secondary | ICD-10-CM | POA: Diagnosis present

## 2016-11-08 DIAGNOSIS — R739 Hyperglycemia, unspecified: Secondary | ICD-10-CM

## 2016-11-08 DIAGNOSIS — R55 Syncope and collapse: Secondary | ICD-10-CM | POA: Diagnosis not present

## 2016-11-08 DIAGNOSIS — I10 Essential (primary) hypertension: Secondary | ICD-10-CM | POA: Diagnosis present

## 2016-11-08 LAB — BASIC METABOLIC PANEL
Anion gap: 5 (ref 5–15)
BUN: 15 mg/dL (ref 6–20)
CO2: 29 mmol/L (ref 22–32)
CREATININE: 1.35 mg/dL — AB (ref 0.61–1.24)
Calcium: 9.2 mg/dL (ref 8.9–10.3)
Chloride: 106 mmol/L (ref 101–111)
GFR calc non Af Amer: 49 mL/min — ABNORMAL LOW (ref >60)
GFR, EST AFRICAN AMERICAN: 57 mL/min — AB (ref >60)
Glucose, Bld: 131 mg/dL — ABNORMAL HIGH (ref 65–99)
Potassium: 3.7 mmol/L (ref 3.5–5.1)
Sodium: 140 mmol/L (ref 135–145)

## 2016-11-08 LAB — GLUCOSE, CAPILLARY
GLUCOSE-CAPILLARY: 93 mg/dL (ref 65–99)
GLUCOSE-CAPILLARY: 101 mg/dL — AB (ref 65–99)
GLUCOSE-CAPILLARY: 121 mg/dL — AB (ref 65–99)
Glucose-Capillary: 111 mg/dL — ABNORMAL HIGH (ref 65–99)
Glucose-Capillary: 119 mg/dL — ABNORMAL HIGH (ref 65–99)

## 2016-11-08 LAB — CBC
HCT: 38.2 % — ABNORMAL LOW (ref 39.0–52.0)
Hemoglobin: 12.1 g/dL — ABNORMAL LOW (ref 13.0–17.0)
MCH: 26.2 pg (ref 26.0–34.0)
MCHC: 31.7 g/dL (ref 30.0–36.0)
MCV: 82.7 fL (ref 78.0–100.0)
PLATELETS: 126 10*3/uL — AB (ref 150–400)
RBC: 4.62 MIL/uL (ref 4.22–5.81)
RDW: 14 % (ref 11.5–15.5)
WBC: 5.7 10*3/uL (ref 4.0–10.5)

## 2016-11-08 LAB — TSH: TSH: 0.969 u[IU]/mL (ref 0.350–4.500)

## 2016-11-08 MED ORDER — DORZOLAMIDE HCL-TIMOLOL MAL 2-0.5 % OP SOLN
1.0000 [drp] | Freq: Two times a day (BID) | OPHTHALMIC | Status: DC
  Administered 2016-11-08 – 2016-11-09 (×3): 1 [drp] via OPHTHALMIC
  Filled 2016-11-08: qty 10

## 2016-11-08 MED ORDER — POLYETHYLENE GLYCOL 3350 17 G PO PACK: 17.0000 g | PACK | Freq: Every day | ORAL | Status: DC | PRN

## 2016-11-08 MED ORDER — ONDANSETRON HCL 4 MG/2ML IJ SOLN: 4.0000 mg | Freq: Four times a day (QID) | INTRAMUSCULAR | Status: DC | PRN

## 2016-11-08 MED ORDER — MORPHINE SULFATE (PF) 2 MG/ML IV SOLN: 2.0000 mg | INTRAVENOUS | Status: DC | PRN

## 2016-11-08 MED ORDER — LACTATED RINGERS IV SOLN
INTRAVENOUS | Status: DC
  Administered 2016-11-08 (×3): via INTRAVENOUS

## 2016-11-08 MED ORDER — DORZOLAMIDE HCL 2 % OP SOLN: 1.0000 [drp] | Freq: Two times a day (BID) | OPHTHALMIC | Status: DC

## 2016-11-08 MED ORDER — INSULIN ASPART 100 UNIT/ML ~~LOC~~ SOLN: 0.0000 [IU] | Freq: Three times a day (TID) | SUBCUTANEOUS | Status: DC

## 2016-11-08 MED ORDER — BISACODYL 5 MG PO TBEC: 5.0000 mg | DELAYED_RELEASE_TABLET | Freq: Every day | ORAL | Status: DC | PRN

## 2016-11-08 MED ORDER — ACETAMINOPHEN 325 MG PO TABS: 650.0000 mg | ORAL_TABLET | Freq: Four times a day (QID) | ORAL | Status: DC | PRN

## 2016-11-08 MED ORDER — AMLODIPINE BESYLATE 10 MG PO TABS
10.0000 mg | ORAL_TABLET | Freq: Every day | ORAL | Status: DC
  Administered 2016-11-08 – 2016-11-09 (×2): 10 mg via ORAL
  Filled 2016-11-08 (×2): qty 1

## 2016-11-08 MED ORDER — SODIUM CHLORIDE 0.9% FLUSH
3.0000 mL | Freq: Two times a day (BID) | INTRAVENOUS | Status: DC
  Administered 2016-11-08 – 2016-11-09 (×2): 3 mL via INTRAVENOUS

## 2016-11-08 MED ORDER — ENOXAPARIN SODIUM 40 MG/0.4ML ~~LOC~~ SOLN
40.0000 mg | SUBCUTANEOUS | Status: DC
  Administered 2016-11-08: 40 mg via SUBCUTANEOUS
  Filled 2016-11-08: qty 0.4

## 2016-11-08 MED ORDER — ONDANSETRON HCL 4 MG PO TABS: 4.0000 mg | ORAL_TABLET | Freq: Four times a day (QID) | ORAL | Status: DC | PRN

## 2016-11-08 MED ORDER — NETARSUDIL DIMESYLATE 0.02 % OP SOLN: 1.0000 [drp] | Freq: Every day | OPHTHALMIC | Status: DC

## 2016-11-08 MED ORDER — ACETAMINOPHEN 650 MG RE SUPP: 650.0000 mg | Freq: Four times a day (QID) | RECTAL | Status: DC | PRN

## 2016-11-08 MED ORDER — FLEET ENEMA 7-19 GM/118ML RE ENEM: 1.0000 | ENEMA | Freq: Once | RECTAL | Status: DC | PRN

## 2016-11-08 MED ORDER — ASPIRIN 81 MG PO CHEW
81.0000 mg | CHEWABLE_TABLET | Freq: Every day | ORAL | Status: DC
  Administered 2016-11-08 – 2016-11-09 (×2): 81 mg via ORAL
  Filled 2016-11-08 (×2): qty 1

## 2016-11-08 MED ORDER — POTASSIUM CHLORIDE CRYS ER 20 MEQ PO TBCR
40.0000 meq | EXTENDED_RELEASE_TABLET | Freq: Once | ORAL | Status: AC
  Administered 2016-11-08: 40 meq via ORAL
  Filled 2016-11-08: qty 2

## 2016-11-08 MED ORDER — DOCUSATE SODIUM 100 MG PO CAPS
100.0000 mg | ORAL_CAPSULE | Freq: Two times a day (BID) | ORAL | Status: DC
  Administered 2016-11-08 – 2016-11-09 (×4): 100 mg via ORAL
  Filled 2016-11-08 (×4): qty 1

## 2016-11-08 MED ORDER — LATANOPROST 0.005 % OP SOLN
1.0000 [drp] | Freq: Every day | OPHTHALMIC | Status: DC
  Administered 2016-11-08 (×2): 1 [drp] via OPHTHALMIC
  Filled 2016-11-08: qty 2.5

## 2016-11-08 NOTE — Progress Notes (Signed)
Patient admitted earlier this morning by my partner Dr. Lorin Mercy.  76 year old male with prior history of CVA (no focal deficits),history of GIST early under observation, hypertension, glaucoma, remote history of PE, who while eating dinner yesterday with his wife-to the syncopal episode. The syncopal episode was witnessed by the patient's spouse. Apparently right them before syncopal episode, patient felt "hot" and lightheaded. Per spouse, he had a blank stare for a few minutes-but was able to maintain posture during this time. He then syncopized, and apparently was down for around 10 minutes. There was no seizure-like activity, tongue biting, or urinary incontinence. When the patient came through, he was confused for around 5-6 minutes, but then started becoming more clear.  Patient denied any chest pain, shortness of breath, palpitations prior to the syncopal episode.  Orthostatic vital signs-negative.  Telemetry-sinus bradycardia  EKG: NSR-but T-wave inversions mostly in the lateral leads (no prior EKG to compare)  Impression: I suspect that this was probably vasovagal syncope. He has no hypoxia, and leg swelling or chest pain-to suggest underlying venous thromboembolism. Although he has a history of CVA-and could have a foci for seizures-I doubt this is the case as well.  Plan: Continue telemetry monitoring Await workup-including Echo/MRI brain/EEG Although I doubt VTE-check lower extremity Dopplers-if negative-doubt further workup required. If above workup is negative-he could be discharged home on 6/18. Patient is aware that he should not drive/operative heavy machinery-due to unexplained syncope at this time. If he has recurrent syncopal episodes in the future-he probably will require a loop recorder placement.

## 2016-11-08 NOTE — Progress Notes (Signed)
EEG completed, results pending. 

## 2016-11-08 NOTE — Procedures (Signed)
EEG Report  Clinical History:  Syncope  Technical Summary:  A 19 channel digital EEG recording was performed using the 10-20 international system of electrode placement.  Bipolar and Referential montages were used.  The total recording time was approx 20 minutes.  Findings:  There is a posterior dominant rhythm of 8 Hz reactive to eye opening and closure.  No focal slowing is present.  He becomes drowsy and enters stage N2 sleep with frontocentral spindles and vertex sharp waves.  No abnormalities during sleep.  There are no epileptiform discharges or electrographic seizures present.  Impression:  This is a normal EEG in the awake and sleep states.  Rogue Jury, MS, MD

## 2016-11-08 NOTE — Progress Notes (Signed)
Dr. Eber Hong reviewed finding from  Doppler. no new orders at this time.

## 2016-11-08 NOTE — Progress Notes (Signed)
*  PRELIMINARY RESULTS* Vascular Ultrasound Lower extremity venous duplex has been completed.  Preliminary findings: Intramuscular calf thrombosis noted in the right soleal vein.  No DVT LLE.   Gave results to patient's nurse.   Landry Mellow, RDMS, RVT  11/08/2016, 3:17 PM

## 2016-11-08 NOTE — H&P (Signed)
History and Physical    ASA BAUDOIN HWE:[id number] DOB: [birth year] DOA: 11/07/2016  PCP:  Has a new doctor, can't think of his name Consultants:  [facility] - orthopedics; [facility] - urology; [facility] - oncology Patient coming from: Home - lives with wife; NOK: wife, [phone]  Chief Complaint: syncope  HPI: Lawrence Bailey is a 76 y.o. male with medical history significant of CKD, HTN, BPH, glaucoma, remote GIST, and CAD presenting following a syncopal episode.  Patient was sitting at a restaurant eating and he blacked out.  Wife reports that they were having dinner and he was fine.  She looked over at him and he seemed to be staring at the cook and flames.  He was not speaking.  Both arms went limp to the sides.  Eyes were still open.  He stayed in the chair and did not fall.  They unfastened his  Belt and tie.  At one point, he stopped breathing, maybe 1-2 minutes.  He was very diaphoretic.  When he came to, he was trrying to figure out why everyone as fussing around him.  Still unable to focus.  No bowel or bladder incontinence.  He did appear to sputter some, trying to cough/talk/breathe simultaneously.  No chest pain.  He did c/o arms tingling the last few days, bilateral and symmetric.    He reports h./o syncope years ago - pre-current wife.  He did have a PE and blacked out then.     ED Course:  He was bradycardic and hypotensive upon EMS arrival.  Quickly returned to baseline.  Initial code STEMI but deactivated based on comparison to prior EKG.  Review of Systems: As per HPI; otherwise review of systems reviewed and negative.   Ambulatory Status:  Ambulates without assistance  Past Medical History:  Diagnosis Date  . BPH (benign prostatic hyperplasia)   . Coronary artery disease   . GIST (gastrointestinal stromal tumor), malignant ([place]) dx'd 04/2007   gleevac comp 04/2008  . Glaucoma   . Hypertension   . Renal insufficiency     Past Surgical History:  Procedure  Laterality Date  . ABDOMINAL SURGERY    . EYE SURGERY    . HERNIA REPAIR    . KNEE SURGERY      Social History   Social History  . Marital status: Married    Spouse name: N/A  . Number of children: N/A  . Years of education: N/A   Occupational History  . retired    Social History Main Topics  . Smoking status: Never Smoker  . Smokeless tobacco: Never Used  . Alcohol use 0.6 oz/week    1 Cans of beer per week     Comment: rare  . Drug use: No  . Sexual activity: Yes   Other Topics Concern  . Not on file   Social History Narrative  . No narrative on file    Allergies  Allergen Reactions  . Other     Bee sting     History reviewed. No pertinent family history.  Prior to Admission medications   Medication Sig Start Date End Date Taking? Authorizing Provider  aspirin 81 MG tablet Take 1 tablet (81 mg total) by mouth daily. 01/25/12  Yes Saverio Danker, PA-C  BYSTOLIC 20 MG TABS Take 20 mg by mouth daily. 10/09/16  Yes [provider]  dorzolamide (TRUSOPT) 2 % ophthalmic solution Place 1 drop into both eyes 2 (two) times daily.   Yes [provider]  dorzolamide-timolol (COSOPT) 22.3-6.8 MG/ML ophthalmic solution Place 1 drop into both eyes 2 (two) times daily. 10/23/16  Yes [provider]  latanoprost (XALATAN) 0.005 % ophthalmic solution Place 1 drop into both eyes at bedtime.   Yes [provider]  Olmesartan-Amlodipine-HCTZ (TRIBENZOR) 40-10-25 MG TABS Take 1 tablet by mouth daily.    Yes [provider]  RHOPRESSA 0.02 % SOLN Place 1 drop into the left eye at bedtime. 10/23/16  Yes [provider]  diphenhydrAMINE (BENADRYL) 25 MG tablet Take 0.5 tablets (12.5 mg total) by mouth every 6 (six) hours as needed for itching. Patient not taking: Reported on 06/18/2016 03/22/12   Awilda Metro, NP    Physical Exam: Vitals:   11/07/16 2115 11/07/16 2127 11/07/16 2128 11/07/16 2230  BP:   (!) 159/90 122/74  Pulse:  (!) 59   (!) 53  Resp: 15   15  Temp:  99.7 F (37.6 C)    TempSrc:  Rectal    SpO2: 98%   99%  Weight:      Height:         General: Appears calm and comfortable and is NAD Eyes:  PERRL, EOMI, normal lids, iris ENT:  grossly normal hearing, lips & tongue, mmm Neck:  no LAD, masses or thyromegaly Cardiovascular:  Mild bradycardia, no m/r/g. No LE edema.  Respiratory:  CTA bilaterally, no w/r/r. Normal respiratory effort. Abdomen:  soft, ntnd, NABS Skin:  no rash or induration seen on limited exam Musculoskeletal:  grossly normal tone BUE/BLE, good ROM, no bony abnormality Psychiatric:  grossly normal mood and affect, speech fluent and appropriate, AOx3 Neurologic:  CN 2-12 grossly intact, moves all extremities in coordinated fashion, sensation intact  Labs on Admission: I have personally reviewed following labs and imaging studies  CBC:  Recent Labs Lab 11/07/16 1952 11/07/16 1957  WBC  --  8.7  HGB 13.9 12.7*  HCT 41.0 39.4  MCV  --  81.9  PLT  --  614   Basic Metabolic Panel:  Recent Labs Lab 11/07/16 1952 11/07/16 1957  NA 140 134*  K 3.2* 3.2*  CL 103 102  CO2  --  21*  GLUCOSE 158* 155*  BUN 25* 20  CREATININE 1.50* 1.57*  CALCIUM  --  9.0   GFR: Estimated Creatinine Clearance: 42.6 mL/min (A) (by C-G formula based on SCr of 1.57 mg/dL (H)). Liver Function Tests: No results for input(s): AST, ALT, ALKPHOS, BILITOT, PROT, ALBUMIN in the last 168 hours. No results for input(s): LIPASE, AMYLASE in the last 168 hours. No results for input(s): AMMONIA in the last 168 hours. Coagulation Profile: No results for input(s): INR, PROTIME in the last 168 hours. Cardiac Enzymes: No results for input(s): CKTOTAL, CKMB, CKMBINDEX, TROPONINI in the last 168 hours. BNP (last 3 results) No results for input(s): PROBNP in the last 8760 hours. HbA1C: No results for input(s): HGBA1C in the last 72 hours. CBG:  Recent Labs Lab 11/07/16 2051  GLUCAP 124*   Lipid  Profile: No results for input(s): CHOL, HDL, LDLCALC, TRIG, CHOLHDL, LDLDIRECT in the last 72 hours. Thyroid Function Tests: No results for input(s): TSH, T4TOTAL, FREET4, T3FREE, THYROIDAB in the last 72 hours. Anemia Panel: No results for input(s): VITAMINB12, FOLATE, FERRITIN, TIBC, IRON, RETICCTPCT in the last 72 hours. Urine analysis:    Component Value Date/Time   COLORURINE YELLOW 11/07/2016 2151   APPEARANCEUR CLEAR 11/07/2016 2151   LABSPEC 1.015 11/07/2016 2151   PHURINE 6.0 11/07/2016 2151  GLUCOSEU NEGATIVE 11/07/2016 2151   HGBUR NEGATIVE 11/07/2016 2151   BILIRUBINUR NEGATIVE 11/07/2016 2151   KETONESUR NEGATIVE 11/07/2016 2151   PROTEINUR NEGATIVE 11/07/2016 2151   UROBILINOGEN 0.2 05/17/2007 1926   NITRITE NEGATIVE 11/07/2016 2151   LEUKOCYTESUR NEGATIVE 11/07/2016 2151    Creatinine Clearance: Estimated Creatinine Clearance: 42.6 mL/min (A) (by C-G formula based on SCr of 1.57 mg/dL (H)).  Sepsis Labs: @LABRCNTIP (procalcitonin:4,lacticidven:4) )No results found for this or any previous visit (from the past 240 hour(s)).   Radiological Exams on Admission: Ct Head Wo Contrast  Result Date: 11/07/2016 CLINICAL DATA:  Syncopal episode EXAM: CT HEAD WITHOUT CONTRAST TECHNIQUE: Contiguous axial images were obtained from the base of the skull through the vertex without intravenous contrast. COMPARISON:  01/24/2012 FINDINGS: Brain: No acute territorial infarction, hemorrhage or intracranial mass is seen. Old left sub insular infarct. Moderate atrophy. Mild periventricular small vessel ischemic white matter changes. Stable ventricle size. Vascular: No hyperdense vessels.  Carotid artery calcifications. Skull: No fracture or suspicious bone lesion Sinuses/Orbits: Mucosal thickening in the ethmoid sinuses. No acute orbital abnormality. Other: None IMPRESSION: 1. No CT evidence for acute intracranial abnormality. 2. Atrophy and small vessel ischemic changes of the white matter.  Electronically Signed   By: Donavan Foil M.D.   On: 11/07/2016 23:32    EKG: Independently reviewed.  NSR with rate 68; LVH, nonspecific ST changes with no evidence of acute ischemia  Assessment/Plan Principal Problem:   Syncope Active Problems:   CKD (chronic kidney disease), stage III   Essential hypertension   Hyperglycemia   Syncope  -Etiology is not clear. The differential diagnosis is broad, including vasovagal syncope, seizure, TIA/stroke given right facial droop, arrhythmia, ACS (less likely, given no chest pain), alcohol intoxication, drug abuse, orthostatic status, carotid artery stenosis -By the [facility] syncope rule, the patient is at low risk for serious outcome. -Given his bradycardia and hypotension following the episode, will observe overnight. -Will monitor on telemetry -Orthostatic vital signs in AM -Trending troponins x3  -2d echo -Neuro checks  -check A1c, FLP, TSH -Continue ASA -If further concern for seizure as cause, will need neurology consult. -UA negative -ETOH negative  Hyperglycemia -Glucose 155 -Check A1c -Cover with SSI  CKD -Creatinine 1.57; 1.5 on 04/10/14 -Appears to be stable, will follow  HTN -Given bradycardia, will hold Bystolic -Given CKD, will hold olmesartan and HCTZ components -Continue Norvasc  DVT prophylaxis: Lovenox  Code Status:  Full - confirmed with patient/family Family Communication: Wife present throughout evaluation  Disposition Plan:  Home once clinically improved Consults called: None  Admission status: It is my clinical opinion that referral for OBSERVATION is reasonable and necessary in this patient based on the above information provided. The aforementioned taken together are felt to place the patient at high risk for further clinical deterioration. However it is anticipated that the patient may be medically stable for discharge from the hospital within 24 to 48 hours.    Karmen Bongo MD [facility]  If 7PM-7AM, please contact night-coverage [email or web address] Password [email or web address]  11/08/2016, 1:13 AM

## 2016-11-09 ENCOUNTER — Observation Stay (HOSPITAL_COMMUNITY): Payer: Medicare Other

## 2016-11-09 ENCOUNTER — Observation Stay (HOSPITAL_BASED_OUTPATIENT_CLINIC_OR_DEPARTMENT_OTHER): Payer: Medicare Other

## 2016-11-09 DIAGNOSIS — N183 Chronic kidney disease, stage 3 (moderate): Secondary | ICD-10-CM

## 2016-11-09 DIAGNOSIS — I824Z1 Acute embolism and thrombosis of unspecified deep veins of right distal lower extremity: Secondary | ICD-10-CM

## 2016-11-09 DIAGNOSIS — N189 Chronic kidney disease, unspecified: Secondary | ICD-10-CM

## 2016-11-09 DIAGNOSIS — N179 Acute kidney failure, unspecified: Secondary | ICD-10-CM | POA: Diagnosis not present

## 2016-11-09 DIAGNOSIS — Z992 Dependence on renal dialysis: Secondary | ICD-10-CM | POA: Diagnosis not present

## 2016-11-09 DIAGNOSIS — R55 Syncope and collapse: Secondary | ICD-10-CM

## 2016-11-09 DIAGNOSIS — I2699 Other pulmonary embolism without acute cor pulmonale: Secondary | ICD-10-CM | POA: Diagnosis not present

## 2016-11-09 DIAGNOSIS — I10 Essential (primary) hypertension: Secondary | ICD-10-CM

## 2016-11-09 DIAGNOSIS — I340 Nonrheumatic mitral (valve) insufficiency: Secondary | ICD-10-CM

## 2016-11-09 LAB — GLUCOSE, CAPILLARY
GLUCOSE-CAPILLARY: 76 mg/dL (ref 65–99)
GLUCOSE-CAPILLARY: 86 mg/dL (ref 65–99)
Glucose-Capillary: 93 mg/dL (ref 65–99)

## 2016-11-09 MED ORDER — SODIUM CHLORIDE 0.9 % IV SOLN
INTRAVENOUS | Status: DC
  Administered 2016-11-09: 11:00:00 via INTRAVENOUS

## 2016-11-09 MED ORDER — RIVAROXABAN 20 MG PO TABS: 20.0000 mg | ORAL_TABLET | Freq: Every day | ORAL | Status: DC

## 2016-11-09 MED ORDER — RIVAROXABAN 15 MG PO TABS
15.0000 mg | ORAL_TABLET | Freq: Two times a day (BID) | ORAL | Status: DC
  Administered 2016-11-09 (×2): 15 mg via ORAL
  Filled 2016-11-09 (×2): qty 1

## 2016-11-09 MED ORDER — RIVAROXABAN (XARELTO) VTE STARTER PACK (15 & 20 MG): ORAL_TABLET | ORAL | 0 refills | Status: DC

## 2016-11-09 MED ORDER — IOPAMIDOL (ISOVUE-370) INJECTION 76%
INTRAVENOUS | Status: AC
  Administered 2016-11-09: 100 mL
  Filled 2016-11-09: qty 100

## 2016-11-09 NOTE — Progress Notes (Signed)
  Echocardiogram 2D Echocardiogram has been performed.  Reynol Arnone T Ann Bohne 11/09/2016, 2:14 PM

## 2016-11-09 NOTE — Discharge Summary (Addendum)
Physician Discharge Summary  Lawrence Bailey LPF:[id number] DOB: [birth year] DOA: 11/07/2016  PCP: Wallene Huh, MD  Admit date: 11/07/2016 Discharge date: 11/09/2016  Admitted From: home Disposition:  home  Recommendations for Outpatient Follow-up:  1. Follow up with PCP in 1-2 weeks 2. Patient is being discharged on Xarelto for right lower leg DVT. Recommend anticoagulation for at least 3 months. 3. Please follow results of 2-D echo with bubble study as outpatient. 4. Please resume his Tribenzor renal function stable as outpatient.  Home Health: None Equipment/Devices: None  Discharge Condition: Stable CODE STATUS: Full code Diet recommendation: Heart Healthy      Discharge Diagnoses:  Principal Problem:   Syncope   Active Problems:   CKD (chronic kidney disease), stage III   Essential hypertension   Hyperglycemia   Lower leg DVT (deep venous thromboembolism), acute, right (HCC)  Brief narrative/history of present illness Please refer to admission H&P for details, in brief, 76 year old male with history of chronic kidney disease stage III, hypertension, remote GIST, coronary artery disease and prior history of hemorrhagic stroke presented to the ED following a syncopal episode. He was at [employer] when he blacked out. Wife informed that when they were having dinner he was stating that the Coke and the flames and was not speaking. Both his arms went limp to the sides and eyes were open. Patient was sitting on the chair and did not fall. They unfastened his belt and his diet. He was diaphoretic and became apneic for a short duration. Patient was slightly confused almost 5-6 minutes after he came through.. No bowel or urinary incontinence. He denies any chest pain, weakness of his arms or legs, tingling or numbness. Reports remote history of PE in the 39s. When EMS arrived he was bradycardic and hypotensive, Quickly returned to baseline. CT head was negative for acute  findings. Placed on observation on telemetry.  Hospital course Syncope Suspect vasovagal versus orthostasis. Stable on telemetry. No further symptoms. Head CT and MRI negative for acute stroke or acute intracranial findings. MRI brain shows chronic hemorrhagic infarct within the left basal ganglia and chronic microvascular ischemic changes. EEG was negative for seizure activity. 2-D echo with bubble study done and results pending to rule out PFO. Can be followed as outpatient.  Right lower extremity DVT Incidentally found to have intramuscular soleal DVT. pain or swelling. Risk factors include a remote PE and GIST. CT angiogram of the chest was negative for PE. Patient will be anticoagulated with Xarelto (patient shows to be on new anticoagulation after different options discussed). Recommend treating for at least 3 months. Explained risk and benefits of anticoagulation and watch out for any alarming symptoms, primarily bleeding.  Acute kidney injury Possibly prerenal. Held Tribenzor (olmesartan-amlodipine- HCTZ). Improving in a.m. labs with IV fluids. Since patient brought IV contrast for CT angiogram I would not resume the medication until he has his renal function checked within 1 week and is improved or stable.   History of stroke. No residual weakness. Continue aspirin and statin.   Procedures: Head CT MRI brain CT angiogram of the chest Lower extremity Doppler 2-D echo with bubble study( result pending) EEG  Family communication: Wife at bedside   Disposition: Home   Large Instructions   Allergies as of 11/09/2016      Reactions   Other    Bee sting      Medication List    STOP taking these medications   TRIBENZOR 40-10-25 MG Tabs Generic drug:  Olmesartan-Amlodipine-HCTZ  TAKE these medications   aspirin 81 MG tablet Take 1 tablet (81 mg total) by mouth daily.   BYSTOLIC 20 MG Tabs Generic drug:  Nebivolol HCl Take 20 mg by mouth daily.   dorzolamide  2 % ophthalmic solution Commonly known as:  TRUSOPT Place 1 drop into both eyes 2 (two) times daily.   dorzolamide-timolol 22.3-6.8 MG/ML ophthalmic solution Commonly known as:  COSOPT Place 1 drop into both eyes 2 (two) times daily.   latanoprost 0.005 % ophthalmic solution Commonly known as:  XALATAN Place 1 drop into both eyes at bedtime.   RHOPRESSA 0.02 % Soln Generic drug:  Netarsudil Dimesylate Place 1 drop into the left eye at bedtime.   Rivaroxaban 15 & 20 MG Tbpk Take as directed on package: Start with one 15mg  tablet by mouth twice a day with food. On Day 22, switch to one 20mg  tablet once a day with food.      Follow-up Information    Spruill, Awanda Mink, MD. Schedule an appointment as soon as possible for a visit in 1 week(s).   Specialty:  Cardiology Contact information: [place] [address] [phone]          Allergies  Allergen Reactions  . Other     Bee sting         Procedures/Studies: Ct Head Wo Contrast  Result Date: 11/07/2016 CLINICAL DATA:  Syncopal episode EXAM: CT HEAD WITHOUT CONTRAST TECHNIQUE: Contiguous axial images were obtained from the base of the skull through the vertex without intravenous contrast. COMPARISON:  01/24/2012 FINDINGS: Brain: No acute territorial infarction, hemorrhage or intracranial mass is seen. Old left sub insular infarct. Moderate atrophy. Mild periventricular small vessel ischemic white matter changes. Stable ventricle size. Vascular: No hyperdense vessels.  Carotid artery calcifications. Skull: No fracture or suspicious bone lesion Sinuses/Orbits: Mucosal thickening in the ethmoid sinuses. No acute orbital abnormality. Other: None IMPRESSION: 1. No CT evidence for acute intracranial abnormality. 2. Atrophy and small vessel ischemic changes of the white matter. Electronically Signed   By: Donavan Foil M.D.   On: 11/07/2016 23:32   Ct Angio Chest Pe W Or Wo Contrast  Result Date:  11/09/2016 CLINICAL DATA:  Syncope and collapse. RIGHT lower extremity deep vein thrombosis. Assess for pulmonary embolism. History of gist tumor 2008, pulmonary embolism. EXAM: CT ANGIOGRAPHY CHEST WITH CONTRAST TECHNIQUE: Multidetector CT imaging of the chest was performed using the standard protocol during bolus administration of intravenous contrast. Multiplanar CT image reconstructions and MIPs were obtained to evaluate the vascular anatomy. CONTRAST:  100 cc Isovue 370 COMPARISON:  Chest radiograph April 14, 2013 and CT chest March 27, 2012 FINDINGS: CARDIOVASCULAR: Adequate contrast opacification of the pulmonary artery's. Main pulmonary artery is not enlarged. No pulmonary arterial filling defects to the level of the subsegmental branches. Heart size is borderline enlarged. No pericardial effusion. Thoracic aorta is normal course and caliber, mild calcific atherosclerosis. MEDIASTINUM/NODES: No lymphadenopathy by CT size criteria. LUNGS/PLEURA: Tracheobronchial tree is patent, no pneumothorax. Mild bronchial wall thickening. No pleural effusion or focal consolidation. 4 mm RIGHT upper lobe pulmonary nodule (series 6, image 52/124), benign given nearly 5 year stability. UPPER ABDOMEN: Included view of the abdomen is unremarkable. MUSCULOSKELETAL: Visualized soft tissues and included osseous structures are nonacute. Moderate bilateral glenohumeral osteoarthrosis. Subcentimeter suspected RIGHT shoulder loose body. Old RIGHT lateral third rib fracture. Review of the MIP images confirms the above findings. IMPRESSION: No acute pulmonary embolism. Mild cardiomegaly. Mild bronchial wall thickening associated with bronchitis  or reactive airway disease without pneumonia. Aortic Atherosclerosis (ICD10-I70.0). Electronically Signed   By: Elon Alas M.D.   On: 11/09/2016 14:51   Mr Brain Wo Contrast  Result Date: 11/08/2016 CLINICAL DATA:  76 y/o  M; syncopal episode. EXAM: MRI HEAD WITHOUT CONTRAST  TECHNIQUE: Multiplanar, multiecho pulse sequences of the brain and surrounding structures were obtained without intravenous contrast. COMPARISON:  11/07/2016 CT of the head. FINDINGS: Brain: No acute infarction, hemorrhage, hydrocephalus, extra-axial collection or mass lesion. Chronic hemosiderin stained infarction in the left lentiform nucleus. Scattered nonspecific foci of T2 FLAIR hyperintense signal abnormality in subcortical and periventricular white matter as well as the central pons are compatible with moderate chronic microvascular ischemic changes. There is moderate brain parenchymal volume loss. Vascular: Normal flow voids. Skull and upper cervical spine: Normal marrow signal. Sinuses/Orbits: Small right maxillary sinus mucous retention cyst. Otherwise no significant abnormal signal of the paranasal sinuses or mastoid air cells. Left intra-ocular lens replacement. Other: None. IMPRESSION: 1. No acute intracranial abnormality identified. 2. Chronic hemorrhagic infarct within the left basal ganglia. 3. Moderate chronic microvascular ischemic changes and moderate parenchymal volume loss of the brain. Electronically Signed   By: Kristine Garbe M.D.   On: 11/08/2016 18:55       Subjective:  no overnight events. Denies any dizziness, lightheadedness, chest pain, palpitations or shortness of breath. Stable on telemetry.   Discharge Exam: Vitals:   11/09/16 0600 11/09/16 1211  BP:  (!) 147/78  Pulse:  61  Resp:  18  Temp: 97.9 F (36.6 C)    Vitals:   11/08/16 2036 11/09/16 0020 11/09/16 0600 11/09/16 1211  BP: (!) 146/80 (!) 159/79  (!) 147/78  Pulse: (!) 51 (!) 58  61  Resp: 20 18  18   Temp: 98.3 F (36.8 C) 98 F (36.7 C) 97.9 F (36.6 C)   TempSrc: Oral Oral Oral   SpO2: 95% 98% 98% 100%  Weight:   84.3 kg (185 lb 14.4 oz)   Height:        General:Elderly male not in distress HEENT : Moist mucosa, supple neck Chest: Clear to auscultation bilaterally Cardiovascular:  RRR, S1/S2 +, no rubs, no gallops GI: Soft, nondistended, nontender,  Musculoskeletal: Warm, no edema CNS: Alert and oriented, nonfocal     The results of significant diagnostics from this hospitalization (including imaging, microbiology, ancillary and laboratory) are listed below for reference.     Microbiology: No results found for this or any previous visit (from the past 240 hour(s)).   Labs: BNP (last 3 results) No results for input(s): BNP in the last 8760 hours. Basic Metabolic Panel:  Recent Labs Lab 11/07/16 1952 11/07/16 1957 11/08/16 1042  NA 140 134* 140  K 3.2* 3.2* 3.7  CL 103 102 106  CO2  --  21* 29  GLUCOSE 158* 155* 131*  BUN 25* 20 15  CREATININE 1.50* 1.57* 1.35*  CALCIUM  --  9.0 9.2   Liver Function Tests: No results for input(s): AST, ALT, ALKPHOS, BILITOT, PROT, ALBUMIN in the last 168 hours. No results for input(s): LIPASE, AMYLASE in the last 168 hours. No results for input(s): AMMONIA in the last 168 hours. CBC:  Recent Labs Lab 11/07/16 1952 11/07/16 1957 11/08/16 1042  WBC  --  8.7 5.7  HGB 13.9 12.7* 12.1*  HCT 41.0 39.4 38.2*  MCV  --  81.9 82.7  PLT  --  150 126*   Cardiac Enzymes: No results for input(s): CKTOTAL, CKMB, CKMBINDEX, TROPONINI  in the last 168 hours. BNP: Invalid input(s): POCBNP CBG:  Recent Labs Lab 11/08/16 1644 11/08/16 2107 11/09/16 0749 11/09/16 1156 11/09/16 1530  GLUCAP 101* 121* 93 76 86   D-Dimer No results for input(s): DDIMER in the last 72 hours. Hgb A1c No results for input(s): HGBA1C in the last 72 hours. Lipid Profile No results for input(s): CHOL, HDL, LDLCALC, TRIG, CHOLHDL, LDLDIRECT in the last 72 hours. Thyroid function studies  Recent Labs  11/08/16 1042  TSH 0.969   Anemia work up No results for input(s): VITAMINB12, FOLATE, FERRITIN, TIBC, IRON, RETICCTPCT in the last 72 hours. Urinalysis    Component Value Date/Time   COLORURINE YELLOW 11/07/2016 2151    APPEARANCEUR CLEAR 11/07/2016 2151   LABSPEC 1.015 11/07/2016 2151   PHURINE 6.0 11/07/2016 2151   GLUCOSEU NEGATIVE 11/07/2016 2151   HGBUR NEGATIVE 11/07/2016 2151   BILIRUBINUR NEGATIVE 11/07/2016 2151   KETONESUR NEGATIVE 11/07/2016 2151   PROTEINUR NEGATIVE 11/07/2016 2151   UROBILINOGEN 0.2 05/17/2007 1926   NITRITE NEGATIVE 11/07/2016 2151   LEUKOCYTESUR NEGATIVE 11/07/2016 2151   Sepsis Labs Invalid input(s): PROCALCITONIN,  WBC,  LACTICIDVEN Microbiology No results found for this or any previous visit (from the past 240 hour(s)).   Time coordinating discharge: < 30 minutes  SIGNED:   Louellen Molder, MD  [facility] 11/09/2016, 3:59 PM Pager   If 7PM-7AM, please contact night-coverage [email or web address] Password [email or web address]

## 2016-11-09 NOTE — Progress Notes (Signed)
Pt has orders to be discharged. Discharge instructions given and pt has no additional questions at this time. Medication regimen reviewed and pt educated. Pt verbalized understanding and has no additional questions. Second dose of xarelto given with dinner. Telemetry box removed. IV removed and site in good condition. Pt stable and waiting for transportation.

## 2016-11-09 NOTE — Progress Notes (Signed)
  Echocardiogram 2D Echocardiogram has been performed.  Hyder Deman T Nimrod Wendt 11/09/2016, 2:14 PM

## 2016-11-09 NOTE — Discharge Instructions (Addendum)
Information on my medicine - XARELTO (rivaroxaban)  This medication education was reviewed with me or my healthcare representative as part of my discharge preparation.    WHY WAS XARELTO PRESCRIBED FOR YOU? Xarelto was prescribed to treat blood clots that may have been found in the veins of your legs (deep vein thrombosis) or in your lungs (pulmonary embolism) and to reduce the risk of them occurring again.  What do you need to know about Xarelto? The starting dose is one 15 mg tablet taken TWICE daily with food for the FIRST 21 DAYS then on (enter date)  11/30/16  the dose is changed to one 20 mg tablet taken ONCE A DAY with your evening meal.  DO NOT stop taking Xarelto without talking to the health care provider who prescribed the medication.  Refill your prescription for 20 mg tablets before you run out.  After discharge, you should have regular check-up appointments with your healthcare provider that is prescribing your Xarelto.  In the future your dose may need to be changed if your kidney function changes by a significant amount.  What do you do if you miss a dose? If you are taking Xarelto TWICE DAILY and you miss a dose, take it as soon as you remember. You may take two 15 mg tablets (total 30 mg) at the same time then resume your regularly scheduled 15 mg twice daily the next day.  If you are taking Xarelto ONCE DAILY and you miss a dose, take it as soon as you remember on the same day then continue your regularly scheduled once daily regimen the next day. Do not take two doses of Xarelto at the same time.   Important Safety Information Xarelto is a blood thinner medicine that can cause bleeding. You should call your healthcare provider right away if you experience any of the following: ? Bleeding from an injury or your nose that does not stop. ? Unusual colored urine (red or dark brown) or unusual colored stools (red or black). ? Unusual bruising for unknown reasons. ? A  serious fall or if you hit your head (even if there is no bleeding).  Some medicines may interact with Xarelto and might increase your risk of bleeding while on Xarelto. To help avoid this, consult your healthcare provider or pharmacist prior to using any new prescription or non-prescription medications, including herbals, vitamins, non-steroidal anti-inflammatory drugs (NSAIDs) and supplements.  This website has more information on Xarelto: [email or web address]-benson.[email or web address]   Deep Vein Thrombosis A deep vein thrombosis (DVT) is a blood clot (thrombus) that usually occurs in a deep, larger vein of the lower leg or the pelvis, or in an upper extremity such as the arm. These are dangerous and can lead to serious and even life-threatening complications if the clot travels to the lungs. A DVT can damage the valves in your leg veins so that instead of flowing upward, the blood pools in the lower leg. This is called post-thrombotic syndrome, and it can result in pain, swelling, discoloration, and sores on the leg. What are the causes? A DVT is caused by the formation of a blood clot in your leg, pelvis, or arm. Usually, several things contribute to the formation of blood clots. A clot may develop when:  Your blood flow slows down.  Your vein becomes damaged in some way.  You have a condition that makes your blood clot more easily.  What increases the risk? A DVT is more likely to develop in:  People who  are older, especially over 94 years of age.  People who are overweight (obese).  People who sit or lie still for a long time, such as during long-distance travel (over 4 hours), bed rest, hospitalization, or during recovery from certain medical conditions like a stroke.  People who do not engage in much physical activity (sedentary lifestyle).  People who have chronic breathing disorders.  People who have a personal or family history of blood clots or blood clotting disease.  People who have peripheral  vascular disease (PVD), diabetes, or some types of cancer.  People who have heart disease, especially if the person had a recent heart attack or has congestive heart failure.  People who have neurological diseases that affect the legs (leg paresis).  People who have had a traumatic injury, such as breaking a hip or leg.  People who have recently had major or lengthy surgery, especially on the hip, knee, or abdomen.  People who have had a central line placed inside a large vein.  People who take medicines that contain the hormone estrogen. These include birth control pills and hormone replacement therapy.  Pregnancy or during childbirth or the postpartum period.  Long plane flights (over 8 hours).  What are the signs or symptoms?  Symptoms of a DVT can include:  Swelling of your leg or arm, especially if one side is much worse.  Warmth and redness of your leg or arm, especially if one side is much worse.  Pain in your arm or leg. If the clot is in your leg, symptoms may be more noticeable or worse when you stand or walk.  A feeling of pins and needles, if the clot is in the arm.  The symptoms of a DVT that has traveled to the lungs (pulmonary embolism, PE) usually start suddenly and include:  Shortness of breath while active or at rest.  Coughing or coughing up blood or blood-tinged mucus.  Chest pain that is often worse with deep breaths.  Rapid or irregular heartbeat.  Feeling light-headed or dizzy.  Fainting.  Feeling anxious.  Sweating.  There may also be pain and swelling in a leg if that is where the blood clot started. These symptoms may represent a serious problem that is an emergency. Do not wait to see if the symptoms will go away. Get medical help right away. Call your local emergency services (911 in the [place] Do not drive yourself to the hospital. How is this diagnosed? Your health care provider will take a medical history and perform a physical exam.  You may also have other tests, including:  Blood tests to assess the clotting properties of your blood.  Imaging tests, such as CT, ultrasound, MRI, X-ray, and other tests to see if you have clots anywhere in your body.  How is this treated? After a DVT is identified, it can be treated. The type of treatment that you receive depends on many factors, such as the cause of your DVT, your risk for bleeding or developing more clots, and other medical conditions that you have. Sometimes, a combination of treatments is necessary. Treatment options may be combined and include:  Monitoring the blood clot with ultrasound.  Taking medicines by mouth, such as newer blood thinners (anticoagulants), thrombolytics, or warfarin.  Taking anticoagulant medicine by injection or through an IV tube.  Wearing compression stockings or using different types ofdevices.  Surgery (rare) to remove the blood clot or to place a filter in your abdomen to stop the blood  clot from traveling to your lungs.  Treatments for a DVT are often divided into immediate treatment and long-term treatment (up to 3 months after DVT). You can work with your health care provider to choose the treatment program that is best for you. Follow these instructions at home: If you are taking a newer oral anticoagulant:  Take the medicine every single day at the same time each day.  Understand what foods and drugs interact with this medicine.  Understand that there are no regular blood tests required when using this medicine.  Understand the side effects of this medicine, including excessive bruising or bleeding. Ask your health care provider or pharmacist about other possible side effects. If you are taking warfarin:  Understand how to take warfarin and know which foods can affect how warfarin works in Veterinary surgeon.  Understand that it is dangerous to take too much or too little warfarin. Too much warfarin increases the risk of bleeding.  Too little warfarin continues to allow the risk for blood clots.  Follow your PT and INR blood testing schedule. The PT and INR results allow your health care provider to adjust your dose of warfarin. It is very important that you have your PT and INR tested as often as told by your health care provider.  Avoid major changes in your diet, or tell your health care provider before you change your diet. Arrange a visit with a registered dietitian to answer your questions. Many foods, especially foods that are high in vitamin K, can interfere with warfarin and affect the PT and INR results. Eat a consistent amount of foods that are high in vitamin K, such as: ? Spinach, kale, broccoli, cabbage, collard greens, turnip greens, Brussels sprouts, peas, cauliflower, seaweed, and parsley. ? Beef liver and pork liver. ? Green tea. ? Soybean oil.  Tell your health care provider about any and all medicines, vitamins, and supplements that you take, including aspirin and other over-the-counter anti-inflammatory medicines. Be especially cautious with aspirin and anti-inflammatory medicines. Do not take those before you ask your health care provider if it is safe to do so. This is important because many medicines can interfere with warfarin and affect the PT and INR results.  Do not start or stop taking any over-the-counter or prescription medicine unless your health care provider or pharmacist tells you to do so. If you take warfarin, you will also need to do these things:  Hold pressure over cuts for longer than usual.  Tell your dentist and other health care providers that you are taking warfarin before you have any procedures in which bleeding may occur.  Avoid alcohol or drink very small amounts. Tell your health care provider if you change your alcohol intake.  Do not use tobacco products, including cigarettes, chewing tobacco, and e-cigarettes. If you need help quitting, ask your health care  provider.  Avoid contact sports.  General instructions  Take over-the-counter and prescription medicines only as told by your health care provider. Anticoagulant medicines can have side effects, including easy bruising and difficulty stopping bleeding. If you are prescribed an anticoagulant, you will also need to do these things: ? Hold pressure over cuts for longer than usual. ? Tell your dentist and other health care providers that you are taking anticoagulants before you have any procedures in which bleeding may occur. ? Avoid contact sports.  Wear a medical alert bracelet or carry a medical alert card that says you have had a PE.  Ask your health  care provider how soon you can go back to your normal activities. Stay active to prevent new blood clots from forming.  Make sure to exercise while traveling or when you have been sitting or standing for a long period of time. It is very important to exercise. Exercise your legs by walking or by tightening and relaxing your leg muscles often. Take frequent walks.  Wear compression stockings as told by your health care provider to help prevent more blood clots from forming.  Do not use tobacco products, including cigarettes, chewing tobacco, and e-cigarettes. If you need help quitting, ask your health care provider.  Keep all follow-up appointments with your health care provider. This is important. How is this prevented? Take these actions to decrease your risk of developing another DVT:  Exercise regularly. For at least 30 minutes every day, engage in: ? Activity that involves moving your arms and legs. ? Activity that encourages good blood flow through your body by increasing your heart rate.  Exercise your arms and legs every hour during long-distance travel (over 4 hours). Drink plenty of water and avoid drinking alcohol while traveling.  Avoid sitting or lying in bed for long periods of time without moving your legs.  Maintain a  weight that is appropriate for your height. Ask your health care provider what weight is healthy for you.  If you are a woman who is over 61 years of age, avoid unnecessary use of medicines that contain estrogen. These include birth control pills.  Do not smoke, especially if you take estrogen medicines. If you need help quitting, ask your health care provider.  If you are hospitalized, prevention measures may include:  Early walking after surgery, as soon as your health care provider says that it is safe.  Receiving anticoagulants to prevent blood clots.If you cannot take anticoagulants, other options may be available, such as wearing compression stockings or using different types of devices.  Get help right away if:  You have new or increased pain, swelling, or redness in an arm or leg.  You have numbness or tingling in an arm or leg.  You have shortness of breath while active or at rest.  You have chest pain.  You have a rapid or irregular heartbeat.  You feel light-headed or dizzy.  You cough up blood.  You notice blood in your vomit, bowel movement, or urine. These symptoms may represent a serious problem that is an emergency. Do not wait to see if the symptoms will go away. Get medical help right away. Call your local emergency services (911 in the [place] Do not drive yourself to the hospital. This information is not intended to replace advice given to you by your health care provider. Make sure you discuss any questions you have with your health care provider. Document Released: 05/11/2005 Document Revised: 10/17/2015 Document Reviewed: 09/05/2014 Elsevier Interactive Patient Education  2017 [employer].

## 2016-11-10 DIAGNOSIS — I639 Cerebral infarction, unspecified: Secondary | ICD-10-CM | POA: Diagnosis not present

## 2016-11-10 DIAGNOSIS — I82401 Acute embolism and thrombosis of unspecified deep veins of right lower extremity: Secondary | ICD-10-CM | POA: Diagnosis not present

## 2016-11-10 DIAGNOSIS — C169 Malignant neoplasm of stomach, unspecified: Secondary | ICD-10-CM | POA: Diagnosis not present

## 2016-11-10 DIAGNOSIS — I2510 Atherosclerotic heart disease of native coronary artery without angina pectoris: Secondary | ICD-10-CM | POA: Diagnosis not present

## 2016-11-10 DIAGNOSIS — I10 Essential (primary) hypertension: Secondary | ICD-10-CM | POA: Diagnosis not present

## 2017-01-11 DIAGNOSIS — I639 Cerebral infarction, unspecified: Secondary | ICD-10-CM | POA: Diagnosis not present

## 2017-01-11 DIAGNOSIS — I10 Essential (primary) hypertension: Secondary | ICD-10-CM | POA: Diagnosis not present

## 2017-01-11 DIAGNOSIS — C169 Malignant neoplasm of stomach, unspecified: Secondary | ICD-10-CM | POA: Diagnosis not present

## 2017-01-11 DIAGNOSIS — N529 Male erectile dysfunction, unspecified: Secondary | ICD-10-CM | POA: Diagnosis not present

## 2017-01-11 DIAGNOSIS — I2510 Atherosclerotic heart disease of native coronary artery without angina pectoris: Secondary | ICD-10-CM | POA: Diagnosis not present

## 2017-01-14 DIAGNOSIS — I10 Essential (primary) hypertension: Secondary | ICD-10-CM | POA: Diagnosis not present

## 2017-01-14 DIAGNOSIS — E785 Hyperlipidemia, unspecified: Secondary | ICD-10-CM | POA: Diagnosis not present

## 2017-03-12 DIAGNOSIS — R972 Elevated prostate specific antigen [PSA]: Secondary | ICD-10-CM | POA: Diagnosis not present

## 2017-03-12 DIAGNOSIS — R351 Nocturia: Secondary | ICD-10-CM | POA: Diagnosis not present

## 2017-03-12 DIAGNOSIS — N5201 Erectile dysfunction due to arterial insufficiency: Secondary | ICD-10-CM | POA: Diagnosis not present

## 2017-03-12 DIAGNOSIS — N401 Enlarged prostate with lower urinary tract symptoms: Secondary | ICD-10-CM | POA: Diagnosis not present

## 2017-04-12 DIAGNOSIS — I639 Cerebral infarction, unspecified: Secondary | ICD-10-CM | POA: Diagnosis not present

## 2017-04-12 DIAGNOSIS — C169 Malignant neoplasm of stomach, unspecified: Secondary | ICD-10-CM | POA: Diagnosis not present

## 2017-04-12 DIAGNOSIS — I10 Essential (primary) hypertension: Secondary | ICD-10-CM | POA: Diagnosis not present

## 2017-04-12 DIAGNOSIS — I2510 Atherosclerotic heart disease of native coronary artery without angina pectoris: Secondary | ICD-10-CM | POA: Diagnosis not present

## 2017-04-14 DIAGNOSIS — K5900 Constipation, unspecified: Secondary | ICD-10-CM | POA: Diagnosis not present

## 2017-04-14 DIAGNOSIS — K625 Hemorrhage of anus and rectum: Secondary | ICD-10-CM | POA: Diagnosis not present

## 2017-04-14 DIAGNOSIS — R1310 Dysphagia, unspecified: Secondary | ICD-10-CM | POA: Diagnosis not present

## 2017-04-16 ENCOUNTER — Ambulatory Visit: Payer: Medicare Other | Admitting: Oncology

## 2017-04-20 DIAGNOSIS — D122 Benign neoplasm of ascending colon: Secondary | ICD-10-CM | POA: Diagnosis not present

## 2017-04-20 DIAGNOSIS — K921 Melena: Secondary | ICD-10-CM | POA: Diagnosis not present

## 2017-04-20 DIAGNOSIS — K635 Polyp of colon: Secondary | ICD-10-CM | POA: Diagnosis not present

## 2017-04-20 DIAGNOSIS — K625 Hemorrhage of anus and rectum: Secondary | ICD-10-CM | POA: Diagnosis not present

## 2017-04-20 DIAGNOSIS — D123 Benign neoplasm of transverse colon: Secondary | ICD-10-CM | POA: Diagnosis not present

## 2017-04-20 DIAGNOSIS — D120 Benign neoplasm of cecum: Secondary | ICD-10-CM | POA: Diagnosis not present

## 2017-04-21 DIAGNOSIS — M1711 Unilateral primary osteoarthritis, right knee: Secondary | ICD-10-CM | POA: Diagnosis not present

## 2017-04-21 DIAGNOSIS — M1712 Unilateral primary osteoarthritis, left knee: Secondary | ICD-10-CM | POA: Diagnosis not present

## 2017-04-23 ENCOUNTER — Telehealth: Payer: Self-pay | Admitting: Oncology

## 2017-04-23 ENCOUNTER — Ambulatory Visit (HOSPITAL_BASED_OUTPATIENT_CLINIC_OR_DEPARTMENT_OTHER): Payer: Medicare Other | Admitting: Oncology

## 2017-04-23 VITALS — BP 152/82 | HR 79 | Temp 98.0°F | Resp 18 | Ht 71.0 in | Wt 187.8 lb

## 2017-04-23 DIAGNOSIS — Z8509 Personal history of malignant neoplasm of other digestive organs: Secondary | ICD-10-CM

## 2017-04-23 DIAGNOSIS — Z86718 Personal history of other venous thrombosis and embolism: Secondary | ICD-10-CM

## 2017-04-23 NOTE — Telephone Encounter (Signed)
Gave avs and calendar for November 2019 °

## 2017-04-23 NOTE — Progress Notes (Signed)
  Lawrence Bailey   Diagnosis: Gastrointestinal stromal tumor  INTERVAL HISTORY:   Lawrence Bailey returns for a scheduled visit.  He was last seen at the Cancer center 1 year ago.  He generally feels well.  Good appetite.  He lost weight when he went on a diet with his wife. He was diagnosed with a right lower extremity DVT in June after he presented with a syncope event.  He reports taking anticoagulation therapy until approximately 1 month ago. He received an ejection in the right knee earlier this week.  He reports swelling in the right leg following this procedure.  This has improved. He had a colonoscopy earlier this week and reports polyps were removed.  He has occasional pain in the right lower abdomen.  No consistent pain.  Objective:  Vital signs in last 24 hours:  Blood pressure (!) 152/82, pulse 79, temperature 98 F (36.7 C), temperature source Oral, resp. rate 18, height 5\' 11"  (1.803 m), weight 187 lb 12.8 oz (85.2 kg), SpO2 100 %.    HEENT: Soft mass at the left posterior lateral neck with consistency of a lipoma Lymphatics: No cervical, supraclavicular, axillary, or inguinal nodes Resp: Lungs clear bilaterally Cardio: Regular rate and rhythm GI: No hepatosplenomegaly, nontender, no mass Vascular: The right lower leg is slightly larger than the left side, no erythema or tenderness      Medications: I have reviewed the patient's current medications.  Assessment/Plan: 1. 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 Pathology showed a 6 cm GI stromal tumor. No vascular or lymphatic invasion. All surgical margins negative. Lymph nodes not sampled. Tumor confined to the submucosa. Mitotic activity 25-50 mitoses per high-power field. He completed 1 year of Gleevec 400 mg daily. Restaging CT abdomen/pelvis 04/10/2014 with no evidence of recurrent/metastatic disease.    Disposition:  [name] remains in clinical remission from the gastrointestinal stromal tumor.  He would like to continue follow-up at the Cancer center.  He will return for an office visit in 1 year.  He completed anticoagulation therapy for a right lower extremity deep vein thrombosis this summer.  He developed swelling in the right leg after a knee injection this week.  He reports improvement in the swelling today.  He will follow-up with Dr. Terrence Dupont if the swelling does not resolve.  15 minutes were spent with the patient today.  The majority of the time was used for counseling and coordination of care.  Lawrence Bailey, Lawrence Bailey  04/23/2017  9:33 AM

## 2017-04-28 DIAGNOSIS — M1711 Unilateral primary osteoarthritis, right knee: Secondary | ICD-10-CM | POA: Diagnosis not present

## 2017-04-28 DIAGNOSIS — M1712 Unilateral primary osteoarthritis, left knee: Secondary | ICD-10-CM | POA: Diagnosis not present

## 2017-05-05 DIAGNOSIS — M1712 Unilateral primary osteoarthritis, left knee: Secondary | ICD-10-CM | POA: Diagnosis not present

## 2017-05-05 DIAGNOSIS — M1711 Unilateral primary osteoarthritis, right knee: Secondary | ICD-10-CM | POA: Diagnosis not present

## 2017-05-10 DIAGNOSIS — C169 Malignant neoplasm of stomach, unspecified: Secondary | ICD-10-CM | POA: Diagnosis not present

## 2017-05-10 DIAGNOSIS — I2510 Atherosclerotic heart disease of native coronary artery without angina pectoris: Secondary | ICD-10-CM | POA: Diagnosis not present

## 2017-05-10 DIAGNOSIS — I639 Cerebral infarction, unspecified: Secondary | ICD-10-CM | POA: Diagnosis not present

## 2017-05-10 DIAGNOSIS — I10 Essential (primary) hypertension: Secondary | ICD-10-CM | POA: Diagnosis not present

## 2017-06-09 DIAGNOSIS — D649 Anemia, unspecified: Secondary | ICD-10-CM | POA: Diagnosis not present

## 2017-06-09 DIAGNOSIS — E559 Vitamin D deficiency, unspecified: Secondary | ICD-10-CM | POA: Diagnosis not present

## 2017-06-09 DIAGNOSIS — R7309 Other abnormal glucose: Secondary | ICD-10-CM | POA: Diagnosis not present

## 2017-06-09 DIAGNOSIS — H409 Unspecified glaucoma: Secondary | ICD-10-CM | POA: Diagnosis not present

## 2017-06-09 DIAGNOSIS — I10 Essential (primary) hypertension: Secondary | ICD-10-CM | POA: Diagnosis not present

## 2017-06-09 DIAGNOSIS — N529 Male erectile dysfunction, unspecified: Secondary | ICD-10-CM | POA: Diagnosis not present

## 2017-06-21 DIAGNOSIS — R972 Elevated prostate specific antigen [PSA]: Secondary | ICD-10-CM | POA: Diagnosis not present

## 2017-06-30 DIAGNOSIS — H401133 Primary open-angle glaucoma, bilateral, severe stage: Secondary | ICD-10-CM | POA: Diagnosis not present

## 2017-07-13 DIAGNOSIS — H6123 Impacted cerumen, bilateral: Secondary | ICD-10-CM | POA: Diagnosis not present

## 2017-08-09 DIAGNOSIS — I639 Cerebral infarction, unspecified: Secondary | ICD-10-CM | POA: Diagnosis not present

## 2017-08-09 DIAGNOSIS — I2510 Atherosclerotic heart disease of native coronary artery without angina pectoris: Secondary | ICD-10-CM | POA: Diagnosis not present

## 2017-08-09 DIAGNOSIS — C169 Malignant neoplasm of stomach, unspecified: Secondary | ICD-10-CM | POA: Diagnosis not present

## 2017-08-09 DIAGNOSIS — I10 Essential (primary) hypertension: Secondary | ICD-10-CM | POA: Diagnosis not present

## 2017-08-09 DIAGNOSIS — E785 Hyperlipidemia, unspecified: Secondary | ICD-10-CM | POA: Diagnosis not present

## 2017-09-14 DIAGNOSIS — W57XXXA Bitten or stung by nonvenomous insect and other nonvenomous arthropods, initial encounter: Secondary | ICD-10-CM | POA: Diagnosis not present

## 2017-09-14 DIAGNOSIS — R2231 Localized swelling, mass and lump, right upper limb: Secondary | ICD-10-CM | POA: Diagnosis not present

## 2017-10-08 DIAGNOSIS — I10 Essential (primary) hypertension: Secondary | ICD-10-CM | POA: Diagnosis not present

## 2017-10-08 DIAGNOSIS — I2510 Atherosclerotic heart disease of native coronary artery without angina pectoris: Secondary | ICD-10-CM | POA: Diagnosis not present

## 2017-10-08 DIAGNOSIS — E785 Hyperlipidemia, unspecified: Secondary | ICD-10-CM | POA: Diagnosis not present

## 2017-10-08 DIAGNOSIS — I639 Cerebral infarction, unspecified: Secondary | ICD-10-CM | POA: Diagnosis not present

## 2017-11-01 DIAGNOSIS — Z961 Presence of intraocular lens: Secondary | ICD-10-CM | POA: Diagnosis not present

## 2017-11-01 DIAGNOSIS — H2511 Age-related nuclear cataract, right eye: Secondary | ICD-10-CM | POA: Diagnosis not present

## 2017-11-01 DIAGNOSIS — H04123 Dry eye syndrome of bilateral lacrimal glands: Secondary | ICD-10-CM | POA: Diagnosis not present

## 2017-11-01 DIAGNOSIS — H401133 Primary open-angle glaucoma, bilateral, severe stage: Secondary | ICD-10-CM | POA: Diagnosis not present

## 2017-11-01 DIAGNOSIS — H10413 Chronic giant papillary conjunctivitis, bilateral: Secondary | ICD-10-CM | POA: Diagnosis not present

## 2017-11-29 DIAGNOSIS — H401133 Primary open-angle glaucoma, bilateral, severe stage: Secondary | ICD-10-CM | POA: Diagnosis not present

## 2017-11-29 DIAGNOSIS — H10413 Chronic giant papillary conjunctivitis, bilateral: Secondary | ICD-10-CM | POA: Diagnosis not present

## 2017-11-29 DIAGNOSIS — H04123 Dry eye syndrome of bilateral lacrimal glands: Secondary | ICD-10-CM | POA: Diagnosis not present

## 2017-11-29 DIAGNOSIS — Z961 Presence of intraocular lens: Secondary | ICD-10-CM | POA: Diagnosis not present

## 2017-11-29 DIAGNOSIS — H2511 Age-related nuclear cataract, right eye: Secondary | ICD-10-CM | POA: Diagnosis not present

## 2017-12-09 DIAGNOSIS — R7309 Other abnormal glucose: Secondary | ICD-10-CM | POA: Diagnosis not present

## 2017-12-09 DIAGNOSIS — I10 Essential (primary) hypertension: Secondary | ICD-10-CM | POA: Diagnosis not present

## 2017-12-09 DIAGNOSIS — I700 Atherosclerosis of aorta: Secondary | ICD-10-CM | POA: Diagnosis not present

## 2017-12-09 DIAGNOSIS — N289 Disorder of kidney and ureter, unspecified: Secondary | ICD-10-CM | POA: Diagnosis not present

## 2017-12-21 ENCOUNTER — Other Ambulatory Visit (HOSPITAL_COMMUNITY): Payer: Self-pay | Admitting: Internal Medicine

## 2017-12-21 DIAGNOSIS — R1310 Dysphagia, unspecified: Secondary | ICD-10-CM

## 2018-01-03 ENCOUNTER — Ambulatory Visit (HOSPITAL_COMMUNITY)
Admission: RE | Admit: 2018-01-03 | Discharge: 2018-01-03 | Disposition: A | Discharge status: Home / Self Care | Payer: Medicare Other | Source: Ambulatory Visit | Attending: Internal Medicine | Admitting: Internal Medicine

## 2018-01-03 DIAGNOSIS — K219 Gastro-esophageal reflux disease without esophagitis: Secondary | ICD-10-CM | POA: Insufficient documentation

## 2018-01-03 DIAGNOSIS — R1310 Dysphagia, unspecified: Secondary | ICD-10-CM | POA: Diagnosis not present

## 2018-01-03 DIAGNOSIS — K222 Esophageal obstruction: Secondary | ICD-10-CM | POA: Insufficient documentation

## 2018-02-07 DIAGNOSIS — I2510 Atherosclerotic heart disease of native coronary artery without angina pectoris: Secondary | ICD-10-CM | POA: Diagnosis not present

## 2018-02-07 DIAGNOSIS — C169 Malignant neoplasm of stomach, unspecified: Secondary | ICD-10-CM | POA: Diagnosis not present

## 2018-02-07 DIAGNOSIS — N529 Male erectile dysfunction, unspecified: Secondary | ICD-10-CM | POA: Diagnosis not present

## 2018-02-07 DIAGNOSIS — Z8673 Personal history of transient ischemic attack (TIA), and cerebral infarction without residual deficits: Secondary | ICD-10-CM | POA: Diagnosis not present

## 2018-02-07 DIAGNOSIS — E785 Hyperlipidemia, unspecified: Secondary | ICD-10-CM | POA: Diagnosis not present

## 2018-02-07 DIAGNOSIS — I10 Essential (primary) hypertension: Secondary | ICD-10-CM | POA: Diagnosis not present

## 2018-02-15 DIAGNOSIS — Z712 Person consulting for explanation of examination or test findings: Secondary | ICD-10-CM | POA: Diagnosis not present

## 2018-02-15 DIAGNOSIS — N289 Disorder of kidney and ureter, unspecified: Secondary | ICD-10-CM | POA: Diagnosis not present

## 2018-02-15 DIAGNOSIS — K219 Gastro-esophageal reflux disease without esophagitis: Secondary | ICD-10-CM | POA: Diagnosis not present

## 2018-02-28 ENCOUNTER — Ambulatory Visit (INDEPENDENT_AMBULATORY_CARE_PROVIDER_SITE_OTHER): Payer: Medicare Other

## 2018-02-28 ENCOUNTER — Ambulatory Visit (HOSPITAL_COMMUNITY)
Admission: EM | Admit: 2018-02-28 | Discharge: 2018-02-28 | Disposition: A | Discharge status: Home / Self Care | Payer: Medicare Other | Attending: Family Medicine | Admitting: Family Medicine

## 2018-02-28 ENCOUNTER — Other Ambulatory Visit: Payer: Self-pay

## 2018-02-28 DIAGNOSIS — B349 Viral infection, unspecified: Secondary | ICD-10-CM | POA: Diagnosis not present

## 2018-02-28 DIAGNOSIS — R6883 Chills (without fever): Secondary | ICD-10-CM

## 2018-02-28 DIAGNOSIS — R509 Fever, unspecified: Secondary | ICD-10-CM | POA: Diagnosis not present

## 2018-02-28 DIAGNOSIS — Z79899 Other long term (current) drug therapy: Secondary | ICD-10-CM | POA: Diagnosis not present

## 2018-02-28 DIAGNOSIS — Z7982 Long term (current) use of aspirin: Secondary | ICD-10-CM | POA: Insufficient documentation

## 2018-02-28 DIAGNOSIS — H409 Unspecified glaucoma: Secondary | ICD-10-CM

## 2018-02-28 LAB — CBC WITH DIFFERENTIAL/PLATELET
ABS IMMATURE GRANULOCYTES: 0.1 10*3/uL (ref 0.0–0.1)
BASOS ABS: 0 10*3/uL (ref 0.0–0.1)
BASOS PCT: 0 %
Eosinophils Absolute: 0 10*3/uL (ref 0.0–0.7)
Eosinophils Relative: 0 %
HCT: 39.5 % (ref 39.0–52.0)
Hemoglobin: 12.6 g/dL — ABNORMAL LOW (ref 13.0–17.0)
Immature Granulocytes: 1 %
Lymphocytes Relative: 2 %
Lymphs Abs: 0.3 10*3/uL — ABNORMAL LOW (ref 0.7–4.0)
MCH: 26.7 pg (ref 26.0–34.0)
MCHC: 31.9 g/dL (ref 30.0–36.0)
MCV: 83.7 fL (ref 78.0–100.0)
MONO ABS: 0.4 10*3/uL (ref 0.1–1.0)
Monocytes Relative: 3 %
NEUTROS ABS: 12.6 10*3/uL — AB (ref 1.7–7.7)
NEUTROS PCT: 94 %
PLATELETS: 147 10*3/uL — AB (ref 150–400)
RBC: 4.72 MIL/uL (ref 4.22–5.81)
RDW: 14 % (ref 11.5–15.5)
WBC: 13.4 10*3/uL — ABNORMAL HIGH (ref 4.0–10.5)

## 2018-02-28 LAB — POCT URINALYSIS DIP (DEVICE)
Bilirubin Urine: NEGATIVE
GLUCOSE, UA: NEGATIVE mg/dL
KETONES UR: NEGATIVE mg/dL
Leukocytes, UA: NEGATIVE
NITRITE: NEGATIVE
Protein, ur: 100 mg/dL — AB
Specific Gravity, Urine: 1.015 (ref 1.005–1.030)
UROBILINOGEN UA: 0.2 mg/dL (ref 0.0–1.0)
pH: 6.5 (ref 5.0–8.0)

## 2018-02-28 NOTE — ED Notes (Signed)
Per Clarene Critchley, PT had tylenol from wife

## 2018-02-28 NOTE — ED Provider Notes (Signed)
[place]    CSN: [id number] Arrival date & time: 02/28/18  1614     History   Chief Complaint Chief Complaint  Patient presents with  . Chills    HPI EDWING FIGLEY is a 77 y.o. male.   HPI This is a pleasant 77 year old gentleman who is here for evaluation of fever and chills.  Started yesterday.  Today is worse.  He has not taken his temperature at home or taking any medicine for the fever.  He is accompanied by his wife.  He is uncertain the cause of the fever.  He has no muscle aches or body aches.  Mild headache.  No sore throat, sinus congestion, runny or stuffy nose.  No ear pain.  No nausea vomiting or diarrhea.  No dysuria or urinary frequency.  No hematuria.  No underlying lung disease such as COPD or asthma.  No known underlying urinary problems although it is suspected he has BPH due to urinary frequency at night.  He is never had a prostate or urinary infection.  He states he had a normal bowel movement this morning, although he did have to strain to move his bowels.  There is a small amount of blood in the toilet after this bowel movement.  This is the only thing out of the ordinary in the last few days.  He states he is eating normally.  Perhaps not drinking as much.  States he is feeling tired. He is not on any blood thinners at this time.  Remote history of DVT with 6 months of meant.  He is on blood pressure medicines and eyedrop for his glaucoma. He has a remote history of a gastric stromal tumor.  2008.  No recurrence. Past Medical History:  Diagnosis Date  . BPH (benign prostatic hyperplasia)   . Coronary artery disease   . 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 Glaucoma   . Hypertension   . Renal insufficiency     Patient Active Problem List   Diagnosis Date Noted  . Glaucoma 02/28/2018  . Lower leg DVT (deep venous thromboembolism), acute, right ([place]) 11/09/2016  . Syncope and collapse     . Syncope 11/08/2016  . CKD (chronic kidney disease), stage III ([place]) 11/08/2016  . Essential hypertension 11/08/2016  . Hyperglycemia 11/08/2016  . Pulmonary contusion 02/04/2012  . Thigh hematoma 02/04/2012  . H/O malignant gastrointestinal stromal tumor (GIST) 07/20/2011    Past Surgical History:  Procedure Laterality Date  . ABDOMINAL SURGERY    . EYE SURGERY    . HERNIA REPAIR    . KNEE SURGERY         Home Medications    Prior to Admission medications   Medication Sig Start Date End Date Taking? Authorizing Provider  amLODipine (NORVASC) 10 MG tablet Take 10 mg by mouth daily. 04/13/17   [provider]  aspirin 81 MG tablet Take 1 tablet (81 mg total) by mouth daily. 01/25/12   Saverio Danker, PA-C  BYSTOLIC 20 MG TABS Take 20 mg by mouth daily. 10/09/16   [provider]  dorzolamide (TRUSOPT) 2 % ophthalmic solution Place 1 drop into both eyes 2 (two) times daily.    [provider]  dorzolamide-timolol (COSOPT) 22.3-6.8 MG/ML ophthalmic solution Place 1 drop into both eyes 2 (two) times daily. 10/23/16   [provider]  latanoprost (XALATAN) 0.005 % ophthalmic solution Place 1 drop into both eyes at bedtime.  [provider]  losartan (COZAAR) 100 MG tablet Take 100 mg by mouth daily. 04/04/17   [provider]  RHOPRESSA 0.02 % SOLN Place 1 drop into the left eye at bedtime. 10/23/16   [provider]    Family History No family history on file. Patient states there is some hypertension heart disease in his family.  No cancer Social History Social History   Tobacco Use  . Smoking status: Never Smoker  . Smokeless tobacco: Never Used  Substance Use Topics  . Alcohol use: Yes    Alcohol/week: 1.0 standard drinks    Types: 1 Cans of beer per week    Comment: rare  . Drug use: No     Allergies   Other   Review of Systems Review of Systems  Constitutional: Positive for chills and fever.   HENT: Negative for ear pain and sore throat.   Eyes: Negative for pain and visual disturbance.  Respiratory: Negative for cough and shortness of breath.   Cardiovascular: Negative for chest pain and palpitations.  Gastrointestinal: Positive for blood in stool. Negative for abdominal pain and vomiting.  Genitourinary: Negative for dysuria and hematuria.  Musculoskeletal: Negative for arthralgias and back pain.  Skin: Negative for color change and rash.  Neurological: Negative for seizures and syncope.  All other systems reviewed and are negative.    Physical Exam Triage Vital Signs ED Triage Vitals  Enc Vitals Group     BP 02/28/18 1726 (!) 147/77     Pulse Rate 02/28/18 1726 96     Resp 02/28/18 1726 16     Temp 02/28/18 1726 (!) 102.9 F (39.4 C)     Temp Source 02/28/18 1726 Oral     SpO2 02/28/18 1726 97 %     Weight 02/28/18 1721 193 lb (87.5 kg)     Height --      Head Circumference --      Peak Flow --      Pain Score --      Pain Loc --      Pain Edu? --      Excl. in [place]? --    No data found.  Updated Vital Signs BP (!) 147/77 (BP Location: Right Arm)   Pulse 96   Temp 100 F (37.8 C) (Oral)   Resp 16   Wt 87.5 kg   SpO2 97%   BMI 26.92 kg/m        Physical Exam  Constitutional: He appears well-developed and well-nourished. No distress.  HENT:  Head: Normocephalic and atraumatic.  Right Ear: External ear normal.  Left Ear: External ear normal.  Mouth/Throat: Oropharynx is clear and moist.  Lips are dry  Eyes: Pupils are equal, round, and reactive to light. Conjunctivae and EOM are normal.  Neck: Normal range of motion.  Cardiovascular: Normal rate and regular rhythm.  Murmur heard. Mild tachycardia, systolic murmur  Pulmonary/Chest: Effort normal and breath sounds normal. No respiratory distress. He has no wheezes. He has no rales.  Abdominal: Soft. Bowel sounds are normal. He exhibits no distension. There is no tenderness.  Question mild  suprapubic tenderness  Genitourinary: Rectum normal and prostate normal.  Musculoskeletal: Normal range of motion. He exhibits no edema.  No tenderness to palpation of the back spinous processes, posterior pelvis, muscular structures.  No CVAT  Lymphadenopathy:    He has no cervical adenopathy.  Neurological: He is alert.  Skin: Skin is warm and dry. No rash noted.  Psychiatric:  His behavior is normal. Thought content normal.     UC Treatments / Results  Labs (all labs ordered are listed, but only abnormal results are displayed) Labs Reviewed  CBC WITH DIFFERENTIAL/PLATELET - Abnormal; Notable for the following components:      Result Value   WBC 13.4 (*)    Hemoglobin 12.6 (*)    Platelets 147 (*)    Neutro Abs 12.6 (*)    Lymphs Abs 0.3 (*)    All other components within normal limits  POCT URINALYSIS DIP (DEVICE) - Abnormal; Notable for the following components:   Hgb urine dipstick TRACE (*)    Protein, ur 100 (*)    All other components within normal limits    EKG None  Radiology Dg Chest 2 View  Result Date: 02/28/2018 CLINICAL DATA:  Got sick at 1500 hours today, fever to 102.9 degrees, chills, history cardiomegaly, arrhythmia, stomach cancer, hernia, pneumonia, hypertension EXAM: CHEST - 2 VIEW COMPARISON:  Of the 06/13/2012 FINDINGS: Enlargement of cardiac silhouette. Mediastinal contours and pulmonary vascularity normal. Peribronchial thickening, chronic. No acute infiltrate, pleural effusion or pneumothorax. Bones demineralized. IMPRESSION: No acute abnormalities. Enlargement of cardiac silhouette with mild chronic bronchitic changes. Electronically Signed   By: Lavonia Dana M.D.   On: 02/28/2018 18:43    Procedures Procedures   Medications Ordered in UC Medications - No data to display  Initial Impression / Assessment and Plan / UC Course  I have reviewed the triage vital signs and the nursing notes.  Pertinent labs & imaging results that were available  during my care of the patient were reviewed by me and considered in my medical decision making (see chart for details).    Discussed with the patient and his wife that he has a fever of unknown origin.  This usually means there is a virus.  There is no clinical symptoms to establish a more definitive diagnosis. They understand if he gets worse instead of better that he needs to return. Final Clinical Impressions(s) / UC Diagnoses   Final diagnoses:  Chills  Viral illness     Discharge Instructions     Continue taking the acetaminophen for pain and fever May take in addition ibuprofen - alternate Push fluids Rest Call your doctor in the morning for follow up IF [place] - return to the ER or Urgent care    ED Prescriptions    None     Controlled Substance Prescriptions [place] Controlled Substance Registry consulted? Not Applicable  Raylene Everts, MD 02/28/18 2054

## 2018-02-28 NOTE — Discharge Instructions (Signed)
Continue taking the acetaminophen for pain and fever May take in addition ibuprofen - alternate Push fluids Rest Call your doctor in the morning for follow up IF [place] - return to the ER or Urgent care

## 2018-02-28 NOTE — ED Triage Notes (Signed)
Pt states he has had chills today.

## 2018-03-07 ENCOUNTER — Other Ambulatory Visit: Payer: Self-pay

## 2018-03-07 ENCOUNTER — Encounter (HOSPITAL_COMMUNITY): Payer: Self-pay | Admitting: Emergency Medicine

## 2018-03-07 ENCOUNTER — Ambulatory Visit (HOSPITAL_COMMUNITY)
Admission: EM | Admit: 2018-03-07 | Discharge: 2018-03-07 | Disposition: A | Discharge status: Home / Self Care | Payer: Medicare Other | Attending: Family Medicine | Admitting: Family Medicine

## 2018-03-07 DIAGNOSIS — M7989 Other specified soft tissue disorders: Secondary | ICD-10-CM | POA: Diagnosis not present

## 2018-03-07 DIAGNOSIS — R972 Elevated prostate specific antigen [PSA]: Secondary | ICD-10-CM | POA: Diagnosis not present

## 2018-03-07 MED ORDER — RIVAROXABAN 15 MG PO TABS: 15.0000 mg | ORAL_TABLET | Freq: Two times a day (BID) | ORAL | 0 refills | Status: DC

## 2018-03-07 NOTE — ED Triage Notes (Signed)
Pt reports right lower leg pain that started last Wednesday.  He states it started swelling on Friday.  He also has associated pain.

## 2018-03-07 NOTE — ED Notes (Signed)
Vascular Lab called to make an appoint for Venous Duplex  Appt. Is 03/08/18 @ 1000 at [facility].

## 2018-03-07 NOTE — ED Provider Notes (Signed)
Lawrence Bailey    CSN: [id number] Arrival date & time: 03/07/18  1434     History   Chief Complaint Chief Complaint  Patient presents with  . Leg Swelling    right    HPI Lawrence Bailey is a 77 y.o. male.   Patient is a 77 year old male with past medical history of BPH, CAD, glaucoma, hypertension, renal insufficiency, DVT.  He presents with 5 days of right lower extremity tenderness, erythema, swelling, increased warmth. Symptoms have worsened over the past few days. He had hx noted in chart of DVTs but pt is unsure of hx when asking him. He takes daily ASA but no other blood thinning medication. He has been laying around a lot the last few weeks due to viral illness and not feeling well. No recent travels. He does not smoke. He denies any associated fever, chills, chest pain, SOB.   Prior chart shows DVT in June of 2018. He took xarelto for treatment at that time. Currently only taking ASA daily.   ROS per HPI      Past Medical History:  Diagnosis Date  . BPH (benign prostatic hyperplasia)   . Coronary artery disease   . GIST (gastrointestinal stromal tumor), malignant ([place]) dx'd 04/2007   gleevac comp 04/2008  . Glaucoma   . Hypertension   . Renal insufficiency     Patient Active Problem List   Diagnosis Date Noted  . Glaucoma 02/28/2018  . Lower leg DVT (deep venous thromboembolism), acute, right ([place]) 11/09/2016  . Syncope and collapse   . Syncope 11/08/2016  . CKD (chronic kidney disease), stage III ([place]) 11/08/2016  . Essential hypertension 11/08/2016  . Hyperglycemia 11/08/2016  . Pulmonary contusion 02/04/2012  . Thigh hematoma 02/04/2012  . H/O malignant gastrointestinal stromal tumor (GIST) 07/20/2011    Past Surgical History:  Procedure Laterality Date  . ABDOMINAL SURGERY    . EYE SURGERY    . HERNIA REPAIR    . KNEE SURGERY         Home Medications    Prior to Admission medications   Medication Sig Start Date End Date  Taking? Authorizing Provider  amLODipine (NORVASC) 10 MG tablet Take 10 mg by mouth daily. 04/13/17  Yes [provider]  aspirin 81 MG tablet Take 1 tablet (81 mg total) by mouth daily. 01/25/12  Yes Saverio Danker, PA-C  BYSTOLIC 20 MG TABS Take 20 mg by mouth daily. 10/09/16  Yes [provider]  dorzolamide (TRUSOPT) 2 % ophthalmic solution Place 1 drop into both eyes 2 (two) times daily.   Yes [provider]  dorzolamide-timolol (COSOPT) 22.3-6.8 MG/ML ophthalmic solution Place 1 drop into both eyes 2 (two) times daily. 10/23/16  Yes [provider]  latanoprost (XALATAN) 0.005 % ophthalmic solution Place 1 drop into both eyes at bedtime.   Yes [provider]  losartan (COZAAR) 100 MG tablet Take 100 mg by mouth daily. 04/04/17  Yes [provider]  pantoprazole (PROTONIX) 40 MG tablet Take 40 mg by mouth daily. 02/12/18  Yes [provider]  RHOPRESSA 0.02 % SOLN Place 1 drop into the left eye at bedtime. 10/23/16  Yes [provider]  Rivaroxaban (XARELTO) 15 MG TABS tablet Take 1 tablet (15 mg total) by mouth 2 (two) times daily with a meal. 03/07/18   Orvan July, NP    Family History History reviewed. No pertinent family history.  Social History Social History   Tobacco Use  .  Smoking status: Never Smoker  . Smokeless tobacco: Never Used  Substance Use Topics  . Alcohol use: Yes    Alcohol/week: 1.0 standard drinks    Types: 1 Cans of beer per week    Comment: rare  . Drug use: No     Allergies   Other   Review of Systems Review of Systems   Physical Exam Triage Vital Signs ED Triage Vitals  Enc Vitals Group     BP 03/07/18 1452 133/65     Pulse Rate 03/07/18 1452 66     Resp --      Temp 03/07/18 1452 98.3 F (36.8 C)     Temp Source 03/07/18 1452 Oral     SpO2 03/07/18 1452 100 %     Weight --      Height --      Head Circumference --      Peak Flow --      Pain Score 03/07/18 1450 3       Pain Loc --      Pain Edu? --      Excl. in [place]? --    No data found.  Updated Vital Signs BP 133/65 (BP Location: Right Arm)   Pulse 66   Temp 98.3 F (36.8 C) (Oral)   SpO2 100%   Visual Acuity Right Eye Distance:   Left Eye Distance:   Bilateral Distance:    Right Eye Near:   Left Eye Near:    Bilateral Near:     Physical Exam  Constitutional: He is oriented to person, place, and time. He appears well-developed and well-nourished.  Very pleasant. Non toxic or ill appearing.   HENT:  Head: Normocephalic and atraumatic.  Right Ear: External ear normal.  Left Ear: External ear normal.  Eyes: Conjunctivae are normal.  Neck: Normal range of motion.  Cardiovascular: Normal rate, regular rhythm and normal heart sounds.  Pulmonary/Chest: Effort normal and breath sounds normal.  Lungs clear in all fields. No dyspnea or distress. No retractions or nasal flaring.   Musculoskeletal: Normal range of motion. He exhibits edema and tenderness.  Moderate swelling, erythema, tenderness, increased warmth to right lower extremity. Tenderness mostly located to the calf area. 1+ pitting edema.   Neurological: He is alert and oriented to person, place, and time.  Skin: Skin is warm and dry.  Psychiatric: He has a normal mood and affect.  Nursing note and vitals reviewed.    UC Treatments / Results  Labs (all labs ordered are listed, but only abnormal results are displayed) Labs Reviewed - No data to display  EKG None  Radiology No results found.  Procedures Procedures (including critical care time)  Medications Ordered in UC Medications - No data to display  Initial Impression / Assessment and Plan / UC Course  I have reviewed the triage vital signs and the nursing notes.  Pertinent labs & imaging results that were available during my care of the patient were reviewed by me and considered in my medical decision making (see chart for details).     High concern for  right lower extremity DVT. First available ultrasound appointment is 10:00 at [facility] long in the morning We will go ahead and start patient on Xarelto 15 mg twice daily until his appointment and then morning Instructed that if he develops any chest pain, shortness of breath or any other worrisome symptoms to please go to the ER Patient and family understanding and agreeable to plan Final Clinical Impressions(s) /  UC Diagnoses   Final diagnoses:  Leg swelling     Discharge Instructions     I believe that you may have a blood clot in the right leg I want you to start taking xarelto twice a day or every 12 hours.  You will go in the morning for an ultrasound at [facility] long hospital. This appointment is at 10 am.  Please arrive 15 to 20 minutes before the appointment time.  If the DVT is positive you will need to follow up with your primary care for further treatment.  During the night if ou develop ay chest pain, SOB or other worrisome symptoms please go to the ER.     ED Prescriptions    Medication Sig Dispense Auth. Provider   Rivaroxaban (XARELTO) 15 MG TABS tablet Take 1 tablet (15 mg total) by mouth 2 (two) times daily with a meal. 10 tablet Loura Halt A, NP     Controlled Substance Prescriptions  Controlled Substance Registry consulted? Not Applicable   Orvan July, NP 03/07/18 1542

## 2018-03-07 NOTE — Discharge Instructions (Addendum)
I believe that you may have a blood clot in the right leg I want you to start taking xarelto twice a day or every 12 hours.  You will go in the morning for an ultrasound at [facility] long hospital. This appointment is at 10 am.  Please arrive 15 to 20 minutes before the appointment time.  If the DVT is positive you will need to follow up with your primary care for further treatment.  During the night if ou develop ay chest pain, SOB or other worrisome symptoms please go to the ER.

## 2018-03-08 ENCOUNTER — Encounter (HOSPITAL_COMMUNITY): Payer: Self-pay | Admitting: Emergency Medicine

## 2018-03-08 ENCOUNTER — Ambulatory Visit (HOSPITAL_COMMUNITY)
Admission: EM | Admit: 2018-03-08 | Discharge: 2018-03-08 | Disposition: A | Discharge status: Home / Self Care | Payer: Medicare Other | Attending: Family Medicine | Admitting: Family Medicine

## 2018-03-08 ENCOUNTER — Other Ambulatory Visit (HOSPITAL_COMMUNITY): Payer: Self-pay | Admitting: Family Medicine

## 2018-03-08 ENCOUNTER — Telehealth: Payer: Self-pay

## 2018-03-08 ENCOUNTER — Ambulatory Visit (HOSPITAL_COMMUNITY)
Admission: RE | Admit: 2018-03-08 | Discharge: 2018-03-08 | Disposition: A | Discharge status: Home / Self Care | Payer: Medicare Other | Source: Ambulatory Visit | Attending: Family Medicine | Admitting: Family Medicine

## 2018-03-08 DIAGNOSIS — R609 Edema, unspecified: Secondary | ICD-10-CM

## 2018-03-08 DIAGNOSIS — M7989 Other specified soft tissue disorders: Secondary | ICD-10-CM | POA: Insufficient documentation

## 2018-03-08 DIAGNOSIS — L03115 Cellulitis of right lower limb: Secondary | ICD-10-CM | POA: Diagnosis not present

## 2018-03-08 MED ORDER — CEPHALEXIN 500 MG PO CAPS: 500.0000 mg | ORAL_CAPSULE | Freq: Four times a day (QID) | ORAL | 0 refills | Status: DC

## 2018-03-08 NOTE — Progress Notes (Addendum)
*  Preliminary Results* Right lower extremity venous duplex completed. Right lower extremity is negative for deep vein thrombosis. There is no evidence of right Baker's cyst.  Incidental finding: there is a heterogenous area of the right groin measuring 3.3cm, suggestive of prominent inguinal lymph node.  03/08/2018 10:46 AM  Maudry Mayhew, MHA, RVT, RDCS, RDMS

## 2018-03-08 NOTE — Telephone Encounter (Signed)
Per 10/15 walk in. patient came in and r/s appointment to 12/2. Will be out of town.

## 2018-03-08 NOTE — Discharge Instructions (Signed)
Your DVT study was negative for any blood clots. Go ahead and stop the Xarelto We will go ahead and treat you today for infection, cellulitis. Antibiotics will be sent to the pharmacy Follow-up for continued or worsening symptoms.

## 2018-03-08 NOTE — ED Triage Notes (Signed)
PT was seen here yesterday and sent to [facility] for an [place]. PT is back today for follow up

## 2018-03-08 NOTE — ED Provider Notes (Signed)
[name]    CSN: [id number] Arrival date & time: 03/08/18  1426     History   Chief Complaint Chief Complaint  Patient presents with  . Follow-up    HPI Lawrence Bailey is a 77 y.o. male.   Is a 77 year old male that presents for follow up. I saw him yesterday and we performed a DVT study to r/o RLE DVT. He has hx of DVT and was having RLE erythema, swelling and tenderness to the calf area. He was treated with xarelto for the previous DVT but no longer takes it. The study proved to be negative but did show incidental finding of right inguinal lymph node swelling. He is still having symptoms.      Past Medical History:  Diagnosis Date  . BPH (benign prostatic hyperplasia)   . Coronary artery disease   . GIST (gastrointestinal stromal tumor), malignant ([place]) dx'd 04/2007   gleevac comp 04/2008  . Glaucoma   . Hypertension   . Renal insufficiency     Patient Active Problem List   Diagnosis Date Noted  . Glaucoma 02/28/2018  . Lower leg DVT (deep venous thromboembolism), acute, right ([place]) 11/09/2016  . Syncope and collapse   . Syncope 11/08/2016  . CKD (chronic kidney disease), stage III ([place]) 11/08/2016  . Essential hypertension 11/08/2016  . Hyperglycemia 11/08/2016  . Pulmonary contusion 02/04/2012  . Thigh hematoma 02/04/2012  . H/O malignant gastrointestinal stromal tumor (GIST) 07/20/2011    Past Surgical History:  Procedure Laterality Date  . ABDOMINAL SURGERY    . EYE SURGERY    . HERNIA REPAIR    . KNEE SURGERY         Home Medications    Prior to Admission medications   Medication Sig Start Date End Date Taking? Authorizing Provider  amLODipine (NORVASC) 10 MG tablet Take 10 mg by mouth daily. 04/13/17   [provider]  aspirin 81 MG tablet Take 1 tablet (81 mg total) by mouth daily. 01/25/12   Saverio Danker, PA-C  BYSTOLIC 20 MG TABS Take 20 mg by mouth daily. 10/09/16   [provider]  cephALEXin (KEFLEX) 500  MG capsule Take 1 capsule (500 mg total) by mouth 4 (four) times daily. 03/08/18   Elsia Lasota, Tressia Miners A, NP  dorzolamide (TRUSOPT) 2 % ophthalmic solution Place 1 drop into both eyes 2 (two) times daily.    [provider]  dorzolamide-timolol (COSOPT) 22.3-6.8 MG/ML ophthalmic solution Place 1 drop into both eyes 2 (two) times daily. 10/23/16   [provider]  latanoprost (XALATAN) 0.005 % ophthalmic solution Place 1 drop into both eyes at bedtime.    [provider]  losartan (COZAAR) 100 MG tablet Take 100 mg by mouth daily. 04/04/17   [provider]  pantoprazole (PROTONIX) 40 MG tablet Take 40 mg by mouth daily. 02/12/18   [provider]  RHOPRESSA 0.02 % SOLN Place 1 drop into the left eye at bedtime. 10/23/16   [provider]    Family History No family history on file.  Social History Social History   Tobacco Use  . Smoking status: Never Smoker  . Smokeless tobacco: Never Used  Substance Use Topics  . Alcohol use: Yes    Alcohol/week: 1.0 standard drinks    Types: 1 Cans of beer per week    Comment: rare  . Drug use: No     Allergies   Other   Review of Systems Review of Systems  Respiratory:  Negative for chest tightness and shortness of breath.   Cardiovascular: Positive for leg swelling. Negative for chest pain.  Skin: Positive for color change.     Physical Exam Triage Vital Signs ED Triage Vitals [03/08/18 1458]  Enc Vitals Group     BP 139/78     Pulse Rate (!) 54     Resp 16     Temp 98.5 F (36.9 C)     Temp Source Oral     SpO2 100 %     Weight      Height      Head Circumference      Peak Flow      Pain Score 6     Pain Loc      Pain Edu?      Excl. in [place]?    No data found.  Updated Vital Signs BP 139/78   Pulse (!) 54   Temp 98.5 F (36.9 C) (Oral)   Resp 16   SpO2 100%   Visual Acuity Right Eye Distance:   Left Eye Distance:   Bilateral Distance:    Right Eye Near:   Left Eye  Near:    Bilateral Near:     Physical Exam  Constitutional: He appears well-developed and well-nourished.  Very pleasant. Non toxic or ill appearing.   HENT:  Head: Normocephalic and atraumatic.  Eyes: Conjunctivae are normal.  Neck: Normal range of motion.  Cardiovascular: Intact distal pulses.  Pulses:      Posterior tibial pulses are 1+ on the right side, and 1+ on the left side.  Pulmonary/Chest: Effort normal.  Musculoskeletal: He exhibits edema and tenderness.  Neurological: He is alert.  Skin: Skin is warm and dry. There is erythema.  RLE swelling 1+ edema with erythema mid LE to right foot. Sensation and pulse intact.   Nursing note and vitals reviewed.    UC Treatments / Results  Labs (all labs ordered are listed, but only abnormal results are displayed) Labs Reviewed - No data to display  EKG None  Radiology No results found.  Procedures Procedures (including critical care time)  Medications Ordered in UC Medications - No data to display  Initial Impression / Assessment and Plan / UC Course  I have reviewed the triage vital signs and the nursing notes.  Pertinent labs & imaging results that were available during my care of the patient were reviewed by me and considered in my medical decision making (see chart for details).     Patient went for DVT study Results will be negative but did find incidental finding of right inguinal lymph node swelling Patient still having symptoms Most likely cellulitis based on symptoms We will go ahead and treat for cellulitis at this time with Keflex x10 days. Instructed to return for continued or worsening symptoms.  Final Clinical Impressions(s) / UC Diagnoses   Final diagnoses:  Cellulitis of right lower extremity     Discharge Instructions     Your DVT study was negative for any blood clots. Go ahead and stop the Xarelto We will go ahead and treat you today for infection, cellulitis. Antibiotics will be  sent to the pharmacy Follow-up for continued or worsening symptoms.    ED Prescriptions    Medication Sig Dispense Auth. Provider   cephALEXin (KEFLEX) 500 MG capsule Take 1 capsule (500 mg total) by mouth 4 (four) times daily. 40 capsule Loura Halt A, NP     Controlled Substance Prescriptions [place] Controlled Substance Registry  consulted? Not Applicable   Orvan July, NP 03/09/18 [phone]

## 2018-03-15 DIAGNOSIS — R972 Elevated prostate specific antigen [PSA]: Secondary | ICD-10-CM | POA: Diagnosis not present

## 2018-03-15 DIAGNOSIS — R3915 Urgency of urination: Secondary | ICD-10-CM | POA: Diagnosis not present

## 2018-03-15 DIAGNOSIS — N401 Enlarged prostate with lower urinary tract symptoms: Secondary | ICD-10-CM | POA: Diagnosis not present

## 2018-03-21 ENCOUNTER — Encounter: Payer: Self-pay | Admitting: Internal Medicine

## 2018-03-21 ENCOUNTER — Ambulatory Visit (INDEPENDENT_AMBULATORY_CARE_PROVIDER_SITE_OTHER): Payer: Medicare Other | Admitting: Internal Medicine

## 2018-03-21 VITALS — BP 124/68 | HR 49 | Temp 97.9°F | Ht 71.0 in | Wt 190.8 lb

## 2018-03-21 DIAGNOSIS — K219 Gastro-esophageal reflux disease without esophagitis: Secondary | ICD-10-CM

## 2018-03-21 DIAGNOSIS — Z09 Encounter for follow-up examination after completed treatment for conditions other than malignant neoplasm: Secondary | ICD-10-CM | POA: Diagnosis not present

## 2018-03-21 DIAGNOSIS — R0602 Shortness of breath: Secondary | ICD-10-CM | POA: Diagnosis not present

## 2018-03-21 DIAGNOSIS — R600 Localized edema: Secondary | ICD-10-CM | POA: Diagnosis not present

## 2018-03-21 DIAGNOSIS — R7309 Other abnormal glucose: Secondary | ICD-10-CM

## 2018-03-21 NOTE — Patient Instructions (Signed)
Edema Edema is when you have too much fluid in your body or under your skin. Edema may make your legs, feet, and ankles swell up. Swelling is also common in looser tissues, like around your eyes. This is a common condition. It gets more common as you get older. There are many possible causes of edema. Eating too much salt (sodium) and being on your feet or sitting for a long time can cause edema in your legs, feet, and ankles. Hot weather may make edema worse. Edema is usually painless. Your skin may look swollen or shiny. Follow these instructions at home:  Keep the swollen body part raised (elevated) above the level of your heart when you are sitting or lying down.  Do not sit still or stand for a long time.  Do not wear tight clothes. Do not wear garters on your upper legs.  Exercise your legs. This can help the swelling go down.  Wear elastic bandages or support stockings as told by your doctor.  Eat a low-salt (low-sodium) diet to reduce fluid as told by your doctor.  Depending on the cause of your swelling, you may need to limit how much fluid you drink (fluid restriction).  Take over-the-counter and prescription medicines only as told by your doctor. Contact a doctor if:  Treatment is not working.  You have heart, liver, or kidney disease and have symptoms of edema.  You have sudden and unexplained weight gain. Get help right away if:  You have shortness of breath or chest pain.  You cannot breathe when you lie down.  You have pain, redness, or warmth in the swollen areas.  You have heart, liver, or kidney disease and get edema all of a sudden.  You have a fever and your symptoms get worse all of a sudden. Summary  Edema is when you have too much fluid in your body or under your skin.  Edema may make your legs, feet, and ankles swell up. Swelling is also common in looser tissues, like around your eyes.  Raise (elevate) the swollen body part above the level of your  heart when you are sitting or lying down.  Follow your doctor's instructions about diet and how much fluid you can drink (fluid restriction). This information is not intended to replace advice given to you by your health care provider. Make sure you discuss any questions you have with your health care provider. Document Released: 10/28/2007 Document Revised: 05/29/2016 Document Reviewed: 05/29/2016 Elsevier Interactive Patient Education  2017 Elsevier Inc.  

## 2018-03-22 LAB — BMP8+EGFR
BUN/Creatinine Ratio: 17 (ref 10–24)
BUN: 22 mg/dL (ref 8–27)
CALCIUM: 9.4 mg/dL (ref 8.6–10.2)
CHLORIDE: 102 mmol/L (ref 96–106)
CO2: 26 mmol/L (ref 20–29)
Creatinine, Ser: 1.32 mg/dL — ABNORMAL HIGH (ref 0.76–1.27)
GFR calc Af Amer: 60 mL/min/{1.73_m2} (ref >59)
GFR calc non Af Amer: 52 mL/min/{1.73_m2} — ABNORMAL LOW (ref >59)
GLUCOSE: 86 mg/dL (ref 65–99)
POTASSIUM: 3.8 mmol/L (ref 3.5–5.2)
Sodium: 139 mmol/L (ref 134–144)

## 2018-03-22 LAB — HEMOGLOBIN A1C
ESTIMATED AVERAGE GLUCOSE: 126 mg/dL
HEMOGLOBIN A1C: 6 % — AB (ref 4.8–5.6)

## 2018-03-22 LAB — PRO B NATRIURETIC PEPTIDE: NT-PRO BNP: 917 pg/mL — AB (ref 0–486)

## 2018-03-25 ENCOUNTER — Other Ambulatory Visit: Payer: Self-pay

## 2018-03-25 DIAGNOSIS — R0602 Shortness of breath: Secondary | ICD-10-CM

## 2018-03-25 NOTE — Progress Notes (Signed)
Tell him to take every other day until he runs out. Also, this was a suggestion - if he tolerates going without the reflux medication. If his sx return, feel free to get refill, but we will take M-F only. Additionally, be sure to stop eating at least 3 hours prior to going to bed.

## 2018-03-27 ENCOUNTER — Encounter: Payer: Self-pay | Admitting: Internal Medicine

## 2018-03-27 NOTE — Progress Notes (Signed)
Subjective:     Patient ID: Lawrence Bailey , male    DOB: December 29, 1940 , 77 y.o.   MRN: [id number]   Chief Complaint  Patient presents with  . ER f/u    HPI  HE IS HERE TODAY FOR F/U ER VISIT. HE REPORTS HE WENT TO ER ON 10/14 FOR FURTHER EVALUATION OF B/L LEG SWELLING. HE REPORTS HIS SX WERE WORSE ON THE RIGHT LEG.  THERE WAS SOME DISCOMFORT WITH AMBULATION. NO RECENT LONG TRIPS. HE WAS SCHEDULED TO RETURN FOR U/S TO R/O DVT. THIS TURNED OUT TO BE NEGATIVE. ENLARGED INGUINAL LN FOUND ON U/S- THEREFORE, HE WAS GIVEN RX KEFLEX TO TREAT RLE CELLULITIS. HE REPORTS LEG IS STILL SWOLLEN. HE ADMITS TO TAKING FULL COURSE OF ABX.     Past Medical History:  Diagnosis Date  . BPH (benign prostatic hyperplasia)   . Coronary artery disease   . 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 Glaucoma   . Hypertension   . Renal insufficiency      History reviewed. No pertinent family history.   Current Outpatient Medications:  .  amLODipine (NORVASC) 10 MG tablet, Take 10 mg by mouth daily., Disp: , Rfl: 3 .  aspirin 81 MG tablet, Take 1 tablet (81 mg total) by mouth daily., Disp: 30 tablet, Rfl:  .  BYSTOLIC 20 MG TABS, Take 20 mg by mouth daily., Disp: , Rfl: 3 .  cephALEXin (KEFLEX) 500 MG capsule, Take 1 capsule (500 mg total) by mouth 4 (four) times daily., Disp: 40 capsule, Rfl: 0 .  dorzolamide (TRUSOPT) 2 % ophthalmic solution, Place 1 drop into both eyes 2 (two) times daily., Disp: , Rfl:  .  dorzolamide-timolol (COSOPT) 22.3-6.8 MG/ML ophthalmic solution, Place 1 drop into both eyes 2 (two) times daily., Disp: , Rfl: 0 .  guaiFENesin-dextromethorphan (ROBITUSSIN DM) 100-10 MG/5ML syrup, Take 5 mLs by mouth every 4 (four) hours as needed for cough., Disp: , Rfl:  .  latanoprost (XALATAN) 0.005 % ophthalmic solution, Place 1 drop into both eyes at bedtime., Disp: , Rfl:  .  losartan-hydrochlorothiazide (HYZAAR) 100-12.5 MG tablet, Take 1  tablet by mouth daily., Disp: , Rfl:  .  Netarsudil-Latanoprost (ROCKLATAN) 0.02-0.005 % SOLN, Apply to eye., Disp: , Rfl:  .  pantoprazole (PROTONIX) 40 MG tablet, Take 40 mg by mouth daily., Disp: , Rfl: 1 .  sildenafil (REVATIO) 20 MG tablet, Take 20 mg by mouth 3 (three) times daily., Disp: , Rfl:  .  tamsulosin (FLOMAX) 0.4 MG CAPS capsule, Take 0.4 mg by mouth., Disp: , Rfl:    Allergies  Allergen Reactions  . Other     Bee sting      Review of Systems  Constitutional: Negative.   HENT: Negative.   Eyes: Negative.   Respiratory: Positive for shortness of breath (OCCASIONAL. UNABLE TO DETERMINE TRIGGERS. SOMETIMES W/ EXERTION. SLEEPS ON 2 PILLOWS FOR COMFORT. DENIES SOB WHEN LYING FLAT. ).   Cardiovascular: Positive for leg swelling.  Genitourinary: Negative.   Neurological: Negative.   Psychiatric/Behavioral: Negative.      Today's Vitals   03/21/18 1524  BP: 124/68  Pulse: (!) 49  Temp: 97.9 F (36.6 C)  TempSrc: Oral  Weight: 190 lb 12.8 oz (86.5 kg)  Height: _0  (1.803 m)  PainSc: 2   PainLoc: Leg   Body mass index is 26.61 kg/m.   Objective:  Physical Exam  Constitutional: He is oriented to person, place,  and time. He appears well-developed and well-nourished.  HENT:  Head: Normocephalic and atraumatic.  Cardiovascular: Normal rate, regular rhythm and normal heart sounds.  B/L LE SWELLING   RLE 16 INCHES LLE 15-1/4 INCHES  Pulmonary/Chest: Effort normal and breath sounds normal.  Neurological: He is alert and oriented to person, place, and time.  Skin: Skin is warm and dry.  Psychiatric: He has a normal mood and affect.  Nursing note and vitals reviewed.       Assessment And Plan:     1. Bilateral lower extremity edema  R>L.  ER RECORDS REVIEWED. HE HAS COMPLETED FULL COURSE OF ABX FOR CELLULITIS, YET HIS SWELLING PERSISTS.  AGAIN, U/S NEG FOR DVT. HE IS ENCOURAGED TO WEAR COMPRESSION HOSE AND TO ELEVATE LEGS WHILE SEATED. HE IS ALSO  ENCOURAGED TO AVOID ADDING SALT TO HIS FOODS.   2. Other abnormal glucose   HIS A1C HAS BEEN ELEVATED IN THE PAST. I WILL CHECK AN A1C, BMET TODAY. HE IS ENCOURAGED TO AVOID SUGARY BEVERAGES AND PROCESSED FOODS INCLUDNG BREADS, RICE AND PASTA.  - BMP8+EGFR - Hemoglobin A1c  3. SOB (shortness of breath) on exertion  I WILL CHECK A BNP TODAY. I WILL MAKE FURTHER RECOMMENDATIONS ONCE HIS LABS ARE AVAILABLE FOR REVIEW.   - Brain natriuretic peptide  4. Gastroesophageal reflux disease without esophagitis  HE HAS NOT HAD SX WHILE ON PPI THERAPY. MY GOAL IS TO GRADUALLY WEAN HIM OFF OF HIS RX. RISKS ASSOCIATED WITH LONG-TERM USE OF PPIS WAS DISCUSSED WITH THE PATIENT IN FULL DETAIL.   [name], MD

## 2018-04-01 ENCOUNTER — Other Ambulatory Visit: Payer: Self-pay

## 2018-04-01 ENCOUNTER — Ambulatory Visit (HOSPITAL_COMMUNITY): Payer: Medicare Other | Attending: Internal Medicine

## 2018-04-01 DIAGNOSIS — R0602 Shortness of breath: Secondary | ICD-10-CM

## 2018-04-22 ENCOUNTER — Ambulatory Visit: Payer: Medicare Other | Admitting: Oncology

## 2018-04-25 ENCOUNTER — Inpatient Hospital Stay: Payer: Medicare Other | Attending: Oncology | Admitting: Oncology

## 2018-04-25 DIAGNOSIS — Z8509 Personal history of malignant neoplasm of other digestive organs: Secondary | ICD-10-CM | POA: Insufficient documentation

## 2018-04-25 DIAGNOSIS — Z86718 Personal history of other venous thrombosis and embolism: Secondary | ICD-10-CM | POA: Insufficient documentation

## 2018-04-28 ENCOUNTER — Telehealth: Payer: Self-pay | Admitting: Oncology

## 2018-04-28 DIAGNOSIS — R972 Elevated prostate specific antigen [PSA]: Secondary | ICD-10-CM | POA: Diagnosis not present

## 2018-04-28 NOTE — Telephone Encounter (Signed)
Patient came in to reschedule missed appointment.

## 2018-05-04 DIAGNOSIS — N528 Other male erectile dysfunction: Secondary | ICD-10-CM | POA: Diagnosis not present

## 2018-05-04 DIAGNOSIS — R972 Elevated prostate specific antigen [PSA]: Secondary | ICD-10-CM | POA: Diagnosis not present

## 2018-05-04 DIAGNOSIS — N400 Enlarged prostate without lower urinary tract symptoms: Secondary | ICD-10-CM | POA: Diagnosis not present

## 2018-05-05 ENCOUNTER — Telehealth: Payer: Self-pay

## 2018-05-05 ENCOUNTER — Inpatient Hospital Stay (HOSPITAL_BASED_OUTPATIENT_CLINIC_OR_DEPARTMENT_OTHER): Payer: Medicare Other | Admitting: Nurse Practitioner

## 2018-05-05 ENCOUNTER — Encounter: Payer: Self-pay | Admitting: Nurse Practitioner

## 2018-05-05 VITALS — BP 159/85 | HR 60 | Temp 97.8°F | Resp 17 | Ht 71.0 in | Wt 187.8 lb

## 2018-05-05 DIAGNOSIS — Z86718 Personal history of other venous thrombosis and embolism: Secondary | ICD-10-CM

## 2018-05-05 DIAGNOSIS — Z8509 Personal history of malignant neoplasm of other digestive organs: Secondary | ICD-10-CM | POA: Diagnosis not present

## 2018-05-05 NOTE — Telephone Encounter (Signed)
Per 12/12 los (Melissa X) added patient to [facility] schedule. Printed avs and calender of upcoming appointment.

## 2018-05-05 NOTE — Progress Notes (Signed)
  [facility] OFFICE PROGRESS NOTE   Diagnosis: Gastrointestinal stromal tumor  INTERVAL HISTORY:   Lawrence Bailey returns for follow-up.  He overall feels well.  He reports he is "getting over a cold".  No fevers or sweats.  He denies abdominal pain.  He has occasional constipation relieved with a stool softener.  No nausea or vomiting.  Objective:  Vital signs in last 24 hours:  Blood pressure (!) 159/85, pulse 60, temperature 97.8 F (36.6 C), temperature source Oral, resp. rate 17, height 5\' 11"  (1.803 m), weight 187 lb 12.8 oz (85.2 kg), SpO2 99 %.    HEENT: No thrush or ulcers. Lymphatics: No palpable cervical, supraclavicular, axillary lymph nodes.  Question tiny left inguinal lymph node.  No obvious adenopathy right inguinal region. Resp: Lungs clear bilaterally. Cardio: Regular rate and rhythm. GI: Abdomen soft and nontender.  No hepatosplenomegaly. Vascular: Trace edema throughout the right leg.   Lab Results:  Lab Results  Component Value Date   WBC 13.4 (H) 02/28/2018   HGB 12.6 (L) 02/28/2018   HCT 39.5 02/28/2018   MCV 83.7 02/28/2018   PLT 147 (L) 02/28/2018   NEUTROABS 12.6 (H) 02/28/2018    Imaging:  No results found.  Medications: I have reviewed the patient's current medications.  Assessment/Plan: 1. 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 Pathology showed a 6 cm GI stromal tumor. No vascular or lymphatic invasion. All surgical margins negative. Lymph nodes not sampled. Tumor confined to the submucosa. Mitotic activity 25-50 mitoses per high-power field. He completed 1 year of Gleevec 400 mg daily. Restaging CT abdomen/pelvis 04/10/2014 with no evidence of recurrent/metastatic disease. 2. History of right lower extremity DVT June 2018. 3. Evaluation in the emergency department 03/08/2018 for right leg redness, swelling and tenderness.  Venous Doppler negative for DVT.  Incidental  finding of a possible 3.3 cm right inguinal lymph node.  Disposition: Lawrence Bailey appears stable.  He remains in clinical remission from the gastrointestinal stromal tumor.   He was seen in the emergency department 03/08/2018 with right leg swelling.  Venous Doppler was negative for DVT.  There was an incidental finding of a possible 3.3 cm right inguinal lymph node.  We are referring him for a noncontrast CT scan of the abdomen/pelvis to evaluate further.  He will return for a follow-up visit in the next 2 to 3 weeks to review the CT results.  Plan reviewed with Dr. Benay Spice.  25 minutes were spent face-to-face at today's visit with the majority of that time involved in counseling/coordination of care.    Ned Card ANP/GNP-BC   05/05/2018  10:42 AM

## 2018-05-09 DIAGNOSIS — I2510 Atherosclerotic heart disease of native coronary artery without angina pectoris: Secondary | ICD-10-CM | POA: Diagnosis not present

## 2018-05-09 DIAGNOSIS — I10 Essential (primary) hypertension: Secondary | ICD-10-CM | POA: Diagnosis not present

## 2018-05-09 DIAGNOSIS — E785 Hyperlipidemia, unspecified: Secondary | ICD-10-CM | POA: Diagnosis not present

## 2018-05-09 DIAGNOSIS — Z8673 Personal history of transient ischemic attack (TIA), and cerebral infarction without residual deficits: Secondary | ICD-10-CM | POA: Diagnosis not present

## 2018-05-09 DIAGNOSIS — C169 Malignant neoplasm of stomach, unspecified: Secondary | ICD-10-CM | POA: Diagnosis not present

## 2018-05-12 ENCOUNTER — Ambulatory Visit (HOSPITAL_COMMUNITY)
Admission: RE | Admit: 2018-05-12 | Discharge: 2018-05-12 | Disposition: A | Discharge status: Home / Self Care | Payer: Medicare Other | Source: Ambulatory Visit | Attending: Nurse Practitioner | Admitting: Nurse Practitioner

## 2018-05-12 DIAGNOSIS — Z8509 Personal history of malignant neoplasm of other digestive organs: Secondary | ICD-10-CM | POA: Diagnosis not present

## 2018-05-12 DIAGNOSIS — N281 Cyst of kidney, acquired: Secondary | ICD-10-CM | POA: Diagnosis not present

## 2018-05-23 ENCOUNTER — Telehealth: Payer: Self-pay | Admitting: Internal Medicine

## 2018-05-23 ENCOUNTER — Telehealth: Payer: Self-pay | Admitting: Cardiology

## 2018-05-23 ENCOUNTER — Telehealth: Payer: Self-pay

## 2018-05-23 NOTE — Telephone Encounter (Signed)
New Message        Tianna with [facility]   is calling today to get the Echocardiogram results faxed over to [phone] for the above patient. Office # [phone]. Pls call and advise.

## 2018-05-23 NOTE — Telephone Encounter (Signed)
-----   Message from Glendale Chard, MD sent at 05/23/2018 11:12 AM EST ----- Pls call his wife Joelene Millin with his lab results. Caleen Jobs contacted him back in October. His kidney fxn is stable. He has predm with a1c 6.0. he also had elevated bnp. This implies he may have reduced heart function. An echo was then scheduled. I can see the order in the system. Has he had the echo yet?

## 2018-05-23 NOTE — Telephone Encounter (Signed)
PT SPOUSE KIMBERLEY REQ RESULTS FROM OFC VISIT ON 10/28

## 2018-05-24 ENCOUNTER — Inpatient Hospital Stay (HOSPITAL_BASED_OUTPATIENT_CLINIC_OR_DEPARTMENT_OTHER): Payer: Medicare Other | Admitting: Nurse Practitioner

## 2018-05-24 ENCOUNTER — Telehealth: Payer: Self-pay | Admitting: Oncology

## 2018-05-24 ENCOUNTER — Encounter: Payer: Self-pay | Admitting: Nurse Practitioner

## 2018-05-24 VITALS — BP 156/82 | HR 55 | Temp 97.8°F | Resp 17 | Ht 71.0 in | Wt 190.0 lb

## 2018-05-24 DIAGNOSIS — Z86718 Personal history of other venous thrombosis and embolism: Secondary | ICD-10-CM | POA: Diagnosis not present

## 2018-05-24 DIAGNOSIS — Z8509 Personal history of malignant neoplasm of other digestive organs: Secondary | ICD-10-CM

## 2018-05-24 NOTE — Progress Notes (Signed)
  [facility] OFFICE PROGRESS NOTE   Diagnosis: Gastrointestinal stromal tumor  INTERVAL HISTORY:   Mr. Morgenstern returns as scheduled.  At the time of his last visit on 05/05/2018, per review of his old medical records, it was noted that a venous Doppler from 03/08/2018 showed a 3.3 cm right inguinal lymph node.  He was referred for a CT scan and is here today to review the results.  Feels well.  He has a good appetite.  His weight is stable.  No nausea or vomiting.  No diarrhea.  No consistent abdominal pain.  Objective:  Vital signs in last 24 hours:  Blood pressure (!) 156/82, pulse (!) 55, temperature 97.8 F (36.6 C), temperature source Oral, resp. rate 17, height 5\' 11"  (1.803 m), weight 190 lb (86.2 kg), SpO2 100 %.    HEENT: Neck without mass. Lymphatics: No palpable inguinal lymph nodes. Resp: Lungs clear bilaterally. Cardio: Regular rate and rhythm. GI: Abdomen soft and nontender.  No hepatomegaly. Vascular: No leg edema.    Lab Results:  Lab Results  Component Value Date   WBC 13.4 (H) 02/28/2018   HGB 12.6 (L) 02/28/2018   HCT 39.5 02/28/2018   MCV 83.7 02/28/2018   PLT 147 (L) 02/28/2018   NEUTROABS 12.6 (H) 02/28/2018    Imaging:  No results found.  Medications: I have reviewed the patient's current medications.  Assessment/Plan: 1. 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 Pathology showed a 6 cm GI stromal tumor. No vascular or lymphatic invasion. All surgical margins negative. Lymph nodes not sampled. Tumor confined to the submucosa. Mitotic activity 25-50 mitoses per high-power field. He completed 1 year of Gleevec 400 mg daily. Restaging CT abdomen/pelvis 04/10/2014 with no evidence of recurrent/metastatic disease.  CT abdomen/pelvis 05/12/2018 without evidence of local recurrence or metastatic disease. 2. History of right lower extremity DVT June 2018. 3. Evaluation in the  emergency department 03/08/2018 for right leg redness, swelling and tenderness.  Venous Doppler negative for DVT.  Incidental finding of a possible 3.3 cm right inguinal lymph node.  CT abdomen/pelvis 05/12/2018 showed stable small inguinal lymph nodes bilaterally.  Disposition: Mr. Meulemans remains in clinical remission from the gastrointestinal stromal tumor.  The recent CT of the abdomen/pelvis showed no significant inguinal adenopathy.  There were stable small inguinal lymph nodes bilaterally (comparison CT 04/10/2014).  Results were reviewed with Mr. Iannone at today's visit.  He will return for his next appointment in 1 year.  He will contact the office in the interim with any problems.    Ned Card ANP/GNP-BC   05/24/2018  2:00 PM

## 2018-05-24 NOTE — Telephone Encounter (Signed)
Gave patient avs report and appointments for December 2020.  °

## 2018-06-15 ENCOUNTER — Encounter: Payer: Self-pay | Admitting: Internal Medicine

## 2018-06-15 ENCOUNTER — Ambulatory Visit (INDEPENDENT_AMBULATORY_CARE_PROVIDER_SITE_OTHER): Payer: Medicare Other | Admitting: Internal Medicine

## 2018-06-15 VITALS — BP 116/78 | HR 60 | Temp 98.1°F | Ht 66.0 in | Wt 192.2 lb

## 2018-06-15 DIAGNOSIS — I129 Hypertensive chronic kidney disease with stage 1 through stage 4 chronic kidney disease, or unspecified chronic kidney disease: Secondary | ICD-10-CM | POA: Diagnosis not present

## 2018-06-15 DIAGNOSIS — N183 Chronic kidney disease, stage 3 unspecified: Secondary | ICD-10-CM

## 2018-06-15 DIAGNOSIS — Z6831 Body mass index (BMI) 31.0-31.9, adult: Secondary | ICD-10-CM

## 2018-06-15 DIAGNOSIS — I5189 Other ill-defined heart diseases: Secondary | ICD-10-CM | POA: Diagnosis not present

## 2018-06-15 DIAGNOSIS — I1310 Hypertensive heart and chronic kidney disease without heart failure, with stage 1 through stage 4 chronic kidney disease, or unspecified chronic kidney disease: Secondary | ICD-10-CM | POA: Diagnosis not present

## 2018-06-15 DIAGNOSIS — R7309 Other abnormal glucose: Secondary | ICD-10-CM | POA: Diagnosis not present

## 2018-06-15 DIAGNOSIS — E6609 Other obesity due to excess calories: Secondary | ICD-10-CM

## 2018-06-15 NOTE — Patient Instructions (Signed)
DASH Eating Plan  DASH stands for "Dietary Approaches to Stop Hypertension." The DASH eating plan is a healthy eating plan that has been shown to reduce high blood pressure (hypertension). It may also reduce your risk for type 2 diabetes, heart disease, and stroke. The DASH eating plan may also help with weight loss.  What are tips for following this plan?    General guidelines   Avoid eating more than 2,300 mg (milligrams) of salt (sodium) a day. If you have hypertension, you may need to reduce your sodium intake to 1,500 mg a day.   Limit alcohol intake to no more than 1 drink a day for nonpregnant women and 2 drinks a day for men. One drink equals 12 oz of beer, 5 oz of wine, or 1 oz of hard liquor.   Work with your health care provider to maintain a healthy body weight or to lose weight. Ask what an ideal weight is for you.   Get at least 30 minutes of exercise that causes your heart to beat faster (aerobic exercise) most days of the week. Activities may include walking, swimming, or biking.   Work with your health care provider or diet and nutrition specialist (dietitian) to adjust your eating plan to your individual calorie needs.  Reading food labels     Check food labels for the amount of sodium per serving. Choose foods with less than 5 percent of the Daily Value of sodium. Generally, foods with less than 300 mg of sodium per serving fit into this eating plan.   To find whole grains, look for the word "whole" as the first word in the ingredient list.  Shopping   Buy products labeled as "low-sodium" or "no salt added."   Buy fresh foods. Avoid canned foods and premade or frozen meals.  Cooking   Avoid adding salt when cooking. Use salt-free seasonings or herbs instead of table salt or sea salt. Check with your health care provider or pharmacist before using salt substitutes.   Do not fry foods. Cook foods using healthy methods such as baking, boiling, grilling, and broiling instead.   Cook with  heart-healthy oils, such as olive, canola, soybean, or sunflower oil.  Meal planning   Eat a balanced diet that includes:  ? 5 or more servings of fruits and vegetables each day. At each meal, try to fill half of your plate with fruits and vegetables.  ? Up to 6-8 servings of whole grains each day.  ? Less than 6 oz of lean meat, poultry, or fish each day. A 3-oz serving of meat is about the same size as a deck of cards. One egg equals 1 oz.  ? 2 servings of low-fat dairy each day.  ? A serving of nuts, seeds, or beans 5 times each week.  ? Heart-healthy fats. Healthy fats called Omega-3 fatty acids are found in foods such as flaxseeds and coldwater fish, like sardines, salmon, and mackerel.   Limit how much you eat of the following:  ? Canned or prepackaged foods.  ? Food that is high in trans fat, such as fried foods.  ? Food that is high in saturated fat, such as fatty meat.  ? Sweets, desserts, sugary drinks, and other foods with added sugar.  ? Full-fat dairy products.   Do not salt foods before eating.   Try to eat at least 2 vegetarian meals each week.   Eat more home-cooked food and less restaurant, buffet, and fast food.     When eating at a restaurant, ask that your food be prepared with less salt or no salt, if possible.  What foods are recommended?  The items listed may not be a complete list. Talk with your dietitian about what dietary choices are best for you.  Grains  Whole-grain or whole-wheat bread. Whole-grain or whole-wheat pasta. Brown rice. Oatmeal. Quinoa. Bulgur. Whole-grain and low-sodium cereals. Pita bread. Low-fat, low-sodium crackers. Whole-wheat flour tortillas.  Vegetables  Fresh or frozen vegetables (raw, steamed, roasted, or grilled). Low-sodium or reduced-sodium tomato and vegetable juice. Low-sodium or reduced-sodium tomato sauce and tomato paste. Low-sodium or reduced-sodium canned vegetables.  Fruits  All fresh, dried, or frozen fruit. Canned fruit in natural juice (without  added sugar).  Meat and other protein foods  Skinless chicken or turkey. Ground chicken or turkey. Pork with fat trimmed off. Fish and seafood. Egg whites. Dried beans, peas, or lentils. Unsalted nuts, nut butters, and seeds. Unsalted canned beans. Lean cuts of beef with fat trimmed off. Low-sodium, lean deli meat.  Dairy  Low-fat (1%) or fat-free (skim) milk. Fat-free, low-fat, or reduced-fat cheeses. Nonfat, low-sodium ricotta or cottage cheese. Low-fat or nonfat yogurt. Low-fat, low-sodium cheese.  Fats and oils  Soft margarine without trans fats. Vegetable oil. Low-fat, reduced-fat, or light mayonnaise and salad dressings (reduced-sodium). Canola, safflower, olive, soybean, and sunflower oils. Avocado.  Seasoning and other foods  Herbs. Spices. Seasoning mixes without salt. Unsalted popcorn and pretzels. Fat-free sweets.  What foods are not recommended?  The items listed may not be a complete list. Talk with your dietitian about what dietary choices are best for you.  Grains  Baked goods made with fat, such as croissants, muffins, or some breads. Dry pasta or rice meal packs.  Vegetables  Creamed or fried vegetables. Vegetables in a cheese sauce. Regular canned vegetables (not low-sodium or reduced-sodium). Regular canned tomato sauce and paste (not low-sodium or reduced-sodium). Regular tomato and vegetable juice (not low-sodium or reduced-sodium). Pickles. Olives.  Fruits  Canned fruit in a light or heavy syrup. Fried fruit. Fruit in cream or butter sauce.  Meat and other protein foods  Fatty cuts of meat. Ribs. Fried meat. Bacon. Sausage. Bologna and other processed lunch meats. Salami. Fatback. Hotdogs. Bratwurst. Salted nuts and seeds. Canned beans with added salt. Canned or smoked fish. Whole eggs or egg yolks. Chicken or turkey with skin.  Dairy  Whole or 2% milk, cream, and half-and-half. Whole or full-fat cream cheese. Whole-fat or sweetened yogurt. Full-fat cheese. Nondairy creamers. Whipped toppings.  Processed cheese and cheese spreads.  Fats and oils  Butter. Stick margarine. Lard. Shortening. Ghee. Bacon fat. Tropical oils, such as coconut, palm kernel, or palm oil.  Seasoning and other foods  Salted popcorn and pretzels. Onion salt, garlic salt, seasoned salt, table salt, and sea salt. Worcestershire sauce. Tartar sauce. Barbecue sauce. Teriyaki sauce. Soy sauce, including reduced-sodium. Steak sauce. Canned and packaged gravies. Fish sauce. Oyster sauce. Cocktail sauce. Horseradish that you find on the shelf. Ketchup. Mustard. Meat flavorings and tenderizers. Bouillon cubes. Hot sauce and Tabasco sauce. Premade or packaged marinades. Premade or packaged taco seasonings. Relishes. Regular salad dressings.  Where to find more information:   National Heart, Lung, and Blood Institute: www.nhlbi.nih.gov   American Heart Association: www.heart.org  Summary   The DASH eating plan is a healthy eating plan that has been shown to reduce high blood pressure (hypertension). It may also reduce your risk for type 2 diabetes, heart disease, and stroke.   With the   DASH eating plan, you should limit salt (sodium) intake to 2,300 mg a day. If you have hypertension, you may need to reduce your sodium intake to 1,500 mg a day.   When on the DASH eating plan, aim to eat more fresh fruits and vegetables, whole grains, lean proteins, low-fat dairy, and heart-healthy fats.   Work with your health care provider or diet and nutrition specialist (dietitian) to adjust your eating plan to your individual calorie needs.  This information is not intended to replace advice given to you by your health care provider. Make sure you discuss any questions you have with your health care provider.  Document Released: 04/30/2011 Document Revised: 05/04/2016 Document Reviewed: 05/04/2016  Elsevier Interactive Patient Education  2019 [employer]

## 2018-06-15 NOTE — Progress Notes (Signed)
Subjective:     Patient ID: Lawrence Bailey , male    DOB: 1940/10/07 , 78 y.o.   MRN: [id number]   Chief Complaint  Patient presents with  . Hypertension    HPI  Hypertension  This is a chronic problem. The current episode started more than 1 year ago. The problem has been gradually improving since onset. The problem is controlled. Pertinent negatives include no blurred vision or chest pain.     Past Medical History:  Diagnosis Date  . BPH (benign prostatic hyperplasia)   . Coronary artery disease   . GIST (gastrointestinal stromal tumor), malignant ([place]) dx'd 04/2007   gleevac comp 04/2008  . Glaucoma   . Hypertension   . Renal insufficiency      Family History  Problem Relation Age of Onset  . Hypertension Mother      Current Outpatient Medications:  .  amLODipine (NORVASC) 10 MG tablet, Take 10 mg by mouth daily., Disp: , Rfl: 3 .  aspirin 81 MG tablet, Take 1 tablet (81 mg total) by mouth daily., Disp: 30 tablet, Rfl:  .  BYSTOLIC 10 MG tablet, Take 10 mg by mouth daily., Disp: , Rfl:  .  dorzolamide-timolol (COSOPT) 22.3-6.8 MG/ML ophthalmic solution, Place 1 drop into both eyes 2 (two) times daily., Disp: , Rfl: 0 .  guaiFENesin-dextromethorphan (ROBITUSSIN DM) 100-10 MG/5ML syrup, Take 5 mLs by mouth every 4 (four) hours as needed for cough., Disp: , Rfl:  .  latanoprost (XALATAN) 0.005 % ophthalmic solution, Place 1 drop into both eyes at bedtime., Disp: , Rfl:  .  losartan-hydrochlorothiazide (HYZAAR) 100-12.5 MG tablet, Take 1 tablet by mouth daily., Disp: , Rfl:  .  Netarsudil-Latanoprost (ROCKLATAN) 0.02-0.005 % SOLN, Apply to eye., Disp: , Rfl:  .  senna (SENOKOT) 8.6 MG tablet, Take 1 tablet by mouth daily., Disp: , Rfl:  .  sildenafil (REVATIO) 20 MG tablet, Take 20 mg by mouth 3 (three) times daily., Disp: , Rfl:  .  tamsulosin (FLOMAX) 0.4 MG CAPS capsule, Take 0.4 mg by mouth., Disp: , Rfl:  .  pantoprazole (PROTONIX) 40 MG tablet, Take 40 mg by mouth  daily., Disp: , Rfl: 1   Allergies  Allergen Reactions  . Other     Bee sting      Review of Systems  Constitutional: Negative.   Eyes: Negative for blurred vision.  Respiratory: Negative.   Cardiovascular: Negative.  Negative for chest pain.  Gastrointestinal: Negative.   Neurological: Negative.   Psychiatric/Behavioral: Negative.      Today's Vitals   06/15/18 1417  BP: 116/78  Pulse: 60  Temp: 98.1 F (36.7 C)  TempSrc: Oral  Weight: 192 lb 3.2 oz (87.2 kg)  Height: 5' 6"  (1.676 m)  PainSc: 0-No pain   Body mass index is 31.02 kg/m.   Objective:  Physical Exam Vitals signs and nursing note reviewed.  Constitutional:      Appearance: Normal appearance. He is obese.  HENT:     Head: Normocephalic and atraumatic.  Cardiovascular:     Rate and Rhythm: Normal rate and regular rhythm.     Heart sounds: Normal heart sounds.  Pulmonary:     Effort: Pulmonary effort is normal.     Breath sounds: Normal breath sounds.  Neurological:     General: No focal deficit present.     Mental Status: He is alert.  Psychiatric:        Mood and Affect: Mood normal.  Assessment And Plan:     1. Hypertensive nephropathy  Well controlled.  He will continue with current meds. Recent Echocardiogram was reviewed. I will send a copy to Dr. Terrence Dupont, his cardiologist, for his review.   - BMP8+EGFR - Lipid Profile  2. Chronic renal disease, stage III (HCC)  Chronic. He is encouraged to stay well hydrated and keep bp well controlled.   3. Diastolic dysfunction without heart failure  Please see #1.   4. Other abnormal glucose  HIS A1C HAS BEEN ELEVATED IN THE PAST. I WILL CHECK AN A1C, BMET TODAY. HE IS ENCOURAGED TO AVOID SUGARY BEVERAGES AND PROCESSED FOODS INCLUDNG BREADS, RICE AND PASTA.  - Hemoglobin A1c  5. Class 1 obesity due to excess calories with serious comorbidity and body mass index (BMI) of 31.0 to 31.9 in adult  He is encouraged to strive for BMI  less than 28 to decrease cardiac risk. He is encouraged to resume his regular exercise regimen. He is advised to work up to 30 minutes five days weekly.   [name], MD

## 2018-06-16 LAB — BMP8+EGFR
BUN / CREAT RATIO: 18 (ref 10–24)
BUN: 23 mg/dL (ref 8–27)
CALCIUM: 9.3 mg/dL (ref 8.6–10.2)
CO2: 26 mmol/L (ref 20–29)
CREATININE: 1.31 mg/dL — AB (ref 0.76–1.27)
Chloride: 102 mmol/L (ref 96–106)
GFR calc Af Amer: 60 mL/min/{1.73_m2} (ref >59)
GFR calc non Af Amer: 52 mL/min/{1.73_m2} — ABNORMAL LOW (ref >59)
GLUCOSE: 107 mg/dL — AB (ref 65–99)
Potassium: 4.4 mmol/L (ref 3.5–5.2)
Sodium: 143 mmol/L (ref 134–144)

## 2018-06-16 LAB — LIPID PANEL
CHOL/HDL RATIO: 2.7 ratio (ref 0.0–5.0)
Cholesterol, Total: 147 mg/dL (ref 100–199)
HDL: 55 mg/dL (ref >39)
LDL Calculated: 71 mg/dL (ref 0–99)
TRIGLYCERIDES: 104 mg/dL (ref 0–149)
VLDL Cholesterol Cal: 21 mg/dL (ref 5–40)

## 2018-06-16 LAB — HEMOGLOBIN A1C
ESTIMATED AVERAGE GLUCOSE: 114 mg/dL
HEMOGLOBIN A1C: 5.6 % (ref 4.8–5.6)

## 2018-08-01 DIAGNOSIS — H401122 Primary open-angle glaucoma, left eye, moderate stage: Secondary | ICD-10-CM | POA: Diagnosis not present

## 2018-08-01 DIAGNOSIS — H04123 Dry eye syndrome of bilateral lacrimal glands: Secondary | ICD-10-CM | POA: Diagnosis not present

## 2018-08-01 DIAGNOSIS — H401113 Primary open-angle glaucoma, right eye, severe stage: Secondary | ICD-10-CM | POA: Diagnosis not present

## 2018-08-08 DIAGNOSIS — E785 Hyperlipidemia, unspecified: Secondary | ICD-10-CM | POA: Diagnosis not present

## 2018-08-08 DIAGNOSIS — I10 Essential (primary) hypertension: Secondary | ICD-10-CM | POA: Diagnosis not present

## 2018-08-08 DIAGNOSIS — Z8673 Personal history of transient ischemic attack (TIA), and cerebral infarction without residual deficits: Secondary | ICD-10-CM | POA: Diagnosis not present

## 2018-08-08 DIAGNOSIS — I2510 Atherosclerotic heart disease of native coronary artery without angina pectoris: Secondary | ICD-10-CM | POA: Diagnosis not present

## 2018-08-09 DIAGNOSIS — H6123 Impacted cerumen, bilateral: Secondary | ICD-10-CM | POA: Diagnosis not present

## 2018-10-13 ENCOUNTER — Telehealth: Payer: Self-pay | Admitting: Internal Medicine

## 2018-10-13 NOTE — Telephone Encounter (Signed)
PT SPOUSE CALLED WANTED TO VERIFY THE APPT FOR 5/26. ADV HER THAT PT AWV IS SCHEDULED FOR TELE VISIT ASKED PT IF THERE WAS ANYWAY WE COULD DO VIDEO VISIT SHE GAVE HER CELL OF [phone] AND STATED SHE WILL HELP SPOUSE AT TIME OF APPT AND HE CONSENTED TO VIRTUAL VISIT

## 2018-10-18 ENCOUNTER — Ambulatory Visit: Payer: Medicare Other

## 2018-10-20 DIAGNOSIS — Z113 Encounter for screening for infections with a predominantly sexual mode of transmission: Secondary | ICD-10-CM | POA: Diagnosis not present

## 2018-10-25 ENCOUNTER — Ambulatory Visit (INDEPENDENT_AMBULATORY_CARE_PROVIDER_SITE_OTHER): Payer: Medicare Other

## 2018-10-25 ENCOUNTER — Ambulatory Visit: Payer: Self-pay

## 2018-10-25 ENCOUNTER — Other Ambulatory Visit: Payer: Self-pay

## 2018-10-25 VITALS — Ht 71.0 in | Wt 190.0 lb

## 2018-10-25 DIAGNOSIS — Z23 Encounter for immunization: Secondary | ICD-10-CM

## 2018-10-25 DIAGNOSIS — Z0000 Encounter for general adult medical examination without abnormal findings: Secondary | ICD-10-CM | POA: Diagnosis not present

## 2018-10-25 MED ORDER — TETANUS-DIPHTH-ACELL PERTUSSIS 5-2.5-18.5 LF-MCG/0.5 IM SUSP: 0.5000 mL | Freq: Once | INTRAMUSCULAR | 0 refills | Status: AC

## 2018-10-25 NOTE — Patient Instructions (Signed)
[name] , Thank you for taking time to come for your Medicare Wellness Visit. I appreciate your ongoing commitment to your health goals. Please review the following plan we discussed and let me know if I can assist you in the future.   Screening recommendations/referrals: Colonoscopy: 03/2017 Recommended yearly ophthalmology/optometry visit for glaucoma screening and checkup Recommended yearly dental visit for hygiene and checkup  Vaccinations: Influenza vaccine: declines Pneumococcal vaccine: declines Tdap vaccine: due Shingles vaccine: discussed    Advanced directives: Advance directive discussed with you today. Even though you declined this today please call our office should you change your mind and we can give you the proper paperwork for you to fill out.   Conditions/risks identified: Overweight  Next appointment: 01/04/2019 at 3:00  Preventive Care 79 Years and Older, Male Preventive care refers to lifestyle choices and visits with your health care provider that can promote health and wellness. What does preventive care include?  A yearly physical exam. This is also called an annual well check.  Dental exams once or twice a year.  Routine eye exams. Ask your health care provider how often you should have your eyes checked.  Personal lifestyle choices, including:  Daily care of your teeth and gums.  Regular physical activity.  Eating a healthy diet.  Avoiding tobacco and drug use.  Limiting alcohol use.  Practicing safe sex.  Taking low doses of aspirin every day.  Taking vitamin and mineral supplements as recommended by your health care provider. What happens during an annual well check? The services and screenings done by your health care provider during your annual well check will depend on your age, overall health, lifestyle risk factors, and family history of disease. Counseling  Your health care provider may ask you questions about your:  Alcohol  use.  Tobacco use.  Drug use.  Emotional well-being.  Home and relationship well-being.  Sexual activity.  Eating habits.  History of falls.  Memory and ability to understand (cognition).  Work and work Statistician. Screening  You may have the following tests or measurements:  Height, weight, and BMI.  Blood pressure.  Lipid and cholesterol levels. These may be checked every 5 years, or more frequently if you are over 79 years old.  Skin check.  Lung cancer screening. You may have this screening every year starting at age 79 if you have a 30-pack-year history of smoking and currently smoke or have quit within the past 15 years.  Fecal occult blood test (FOBT) of the stool. You may have this test every year starting at age 79.  Flexible sigmoidoscopy or colonoscopy. You may have a sigmoidoscopy every 5 years or a colonoscopy every 10 years starting at age 79.  Prostate cancer screening. Recommendations will vary depending on your family history and other risks.  Hepatitis C blood test.  Hepatitis B blood test.  Sexually transmitted disease (STD) testing.  Diabetes screening. This is done by checking your blood sugar (glucose) after you have not eaten for a while (fasting). You may have this done every 1-3 years.  Abdominal aortic aneurysm (AAA) screening. You may need this if you are a current or former smoker.  Osteoporosis. You may be screened starting at age 67 if you are at high risk. Talk with your health care provider about your test results, treatment options, and if necessary, the need for more tests. Vaccines  Your health care provider may recommend certain vaccines, such as:  Influenza vaccine. This is recommended every year.  Tetanus, diphtheria, and acellular pertussis (Tdap, Td) vaccine. You may need a Td booster every 10 years.  Zoster vaccine. You may need this after age 79.  Pneumococcal 13-valent conjugate (PCV13) vaccine. One dose is  recommended after age 79.  Pneumococcal polysaccharide (PPSV23) vaccine. One dose is recommended after age 79. Talk to your health care provider about which screenings and vaccines you need and how often you need them. This information is not intended to replace advice given to you by your health care provider. Make sure you discuss any questions you have with your health care provider. Document Released: 06/07/2015 Document Revised: 01/29/2016 Document Reviewed: 03/12/2015 Elsevier Interactive Patient Education  2017 [place] Prevention in the Home Falls can cause injuries. They can happen to people of all ages. There are many things you can do to make your home safe and to help prevent falls. What can I do on the outside of my home?  Regularly fix the edges of walkways and driveways and fix any cracks.  Remove anything that might make you trip as you walk through a door, such as a raised step or threshold.  Trim any bushes or trees on the path to your home.  Use bright outdoor lighting.  Clear any walking paths of anything that might make someone trip, such as rocks or tools.  Regularly check to see if handrails are loose or broken. Make sure that both sides of any steps have handrails.  Any raised decks and porches should have guardrails on the edges.  Have any leaves, snow, or ice cleared regularly.  Use sand or salt on walking paths during winter.  Clean up any spills in your garage right away. This includes oil or grease spills. What can I do in the bathroom?  Use night lights.  Install grab bars by the toilet and in the tub and shower. Do not use towel bars as grab bars.  Use non-skid mats or decals in the tub or shower.  If you need to sit down in the shower, use a plastic, non-slip stool.  Keep the floor dry. Clean up any water that spills on the floor as soon as it happens.  Remove soap buildup in the tub or shower regularly.  Attach bath mats  securely with double-sided non-slip rug tape.  Do not have throw rugs and other things on the floor that can make you trip. What can I do in the bedroom?  Use night lights.  Make sure that you have a light by your bed that is easy to reach.  Do not use any sheets or blankets that are too big for your bed. They should not hang down onto the floor.  Have a firm chair that has side arms. You can use this for support while you get dressed.  Do not have throw rugs and other things on the floor that can make you trip. What can I do in the kitchen?  Clean up any spills right away.  Avoid walking on wet floors.  Keep items that you use a lot in easy-to-reach places.  If you need to reach something above you, use a strong step stool that has a grab bar.  Keep electrical cords out of the way.  Do not use floor polish or wax that makes floors slippery. If you must use wax, use non-skid floor wax.  Do not have throw rugs and other things on the floor that can make you trip. What can I do  with my stairs?  Do not leave any items on the stairs.  Make sure that there are handrails on both sides of the stairs and use them. Fix handrails that are broken or loose. Make sure that handrails are as long as the stairways.  Check any carpeting to make sure that it is firmly attached to the stairs. Fix any carpet that is loose or worn.  Avoid having throw rugs at the top or bottom of the stairs. If you do have throw rugs, attach them to the floor with carpet tape.  Make sure that you have a light switch at the top of the stairs and the bottom of the stairs. If you do not have them, ask someone to add them for you. What else can I do to help prevent falls?  Wear shoes that:  Do not have high heels.  Have rubber bottoms.  Are comfortable and fit you well.  Are closed at the toe. Do not wear sandals.  If you use a stepladder:  Make sure that it is fully opened. Do not climb a closed  stepladder.  Make sure that both sides of the stepladder are locked into place.  Ask someone to hold it for you, if possible.  Clearly mark and make sure that you can see:  Any grab bars or handrails.  First and last steps.  Where the edge of each step is.  Use tools that help you move around (mobility aids) if they are needed. These include:  Canes.  Walkers.  Scooters.  Crutches.  Turn on the lights when you go into a dark area. Replace any light bulbs as soon as they burn out.  Set up your furniture so you have a clear path. Avoid moving your furniture around.  If any of your floors are uneven, fix them.  If there are any pets around you, be aware of where they are.  Review your medicines with your doctor. Some medicines can make you feel dizzy. This can increase your chance of falling. Ask your doctor what other things that you can do to help prevent falls. This information is not intended to replace advice given to you by your health care provider. Make sure you discuss any questions you have with your health care provider. Document Released: 03/07/2009 Document Revised: 10/17/2015 Document Reviewed: 06/15/2014 Elsevier Interactive Patient Education  2017 [employer].

## 2018-10-25 NOTE — Progress Notes (Signed)
Subjective:   Lawrence Bailey is a 78 y.o. male who presents for Medicare Annual/Subsequent preventive examination.  This visit type was conducted due to national recommendations for restrictions regarding the COVID-19 Pandemic (e.g. social distancing). This format is felt to be most appropriate for this patient at this time. All issues noted in this document were discussed and addressed. No physical exam was performed (except for noted visual exam findings with Video Visits). The patient, Lawrence Bailey, has given consent to perform this visit via video. Vital signs may be absent or patient reported.   Patient location:  At home  Nurse location:  [place] office  Review of Systems:  n/a Cardiac Risk Factors include: advanced age (>31men, >28 women);male gender;hypertension     Objective:    Vitals: Ht 5\' 11"  (1.803 m) Comment: per patient  Wt 190 lb (86.2 kg) Comment: per patient  BMI 26.50 kg/m   Body mass index is 26.5 kg/m.  Advanced Directives 10/25/2018 11/08/2016 11/07/2016 04/10/2016 01/24/2012  Does Patient Have a Medical Advance Directive? No No No No Patient does not have advance directive;Patient would not like information  Would patient like information on creating a medical advance directive? - No - Patient declined - No - patient declined information -  Pre-existing out of facility DNR order (yellow form or pink MOST form) - - - - No    Tobacco Social History   Tobacco Use  Smoking Status Never Smoker  Smokeless Tobacco Never Used     Counseling given: Not Answered   Clinical Intake:  Pre-visit preparation completed: Yes  Pain : No/denies pain Pain Score: 0-No pain     Nutritional Status: BMI 25 -29 Overweight Nutritional Risks: None Diabetes: No  How often do you need to have someone help you when you read instructions, pamphlets, or other written materials from your doctor or pharmacy?: 1 - Never What is the last grade level you completed in school?:  10th grade  Interpreter Needed?: No  Information entered by :: NAllen LPN  Past Medical History:  Diagnosis Date  . BPH (benign prostatic hyperplasia)   . Coronary artery disease   . GIST (gastrointestinal stromal tumor), malignant ([place]) dx'd 04/2007   gleevac comp 04/2008  . Glaucoma   . Hypertension   . Renal insufficiency    Past Surgical History:  Procedure Laterality Date  . ABDOMINAL SURGERY    . EYE SURGERY    . HERNIA REPAIR    . KNEE SURGERY     Family History  Problem Relation Age of Onset  . Hypertension Mother    Social History   Socioeconomic History  . Marital status: Married    Spouse name: Not on file  . Number of children: Not on file  . Years of education: Not on file  . Highest education level: Not on file  Occupational History  . Occupation: retired  Scientific laboratory technician  . Financial resource strain: Not hard at all  . Food insecurity:    Worry: Never true    Inability: Never true  . Transportation needs:    Medical: No    Non-medical: No  Tobacco Use  . Smoking status: Never Smoker  . Smokeless tobacco: Never Used  Substance and Sexual Activity  . Alcohol use: Not Currently  . Drug use: No  . Sexual activity: Yes  Lifestyle  . Physical activity:    Days per week: 2 days    Minutes per session: 10 min  . Stress:  Not at all  Relationships  . Social connections:    Talks on phone: Not on file    Gets together: Not on file    Attends religious service: Not on file    Active member of club or organization: Not on file    Attends meetings of clubs or organizations: Not on file    Relationship status: Not on file  Other Topics Concern  . Not on file  Social History Narrative  . Not on file    Outpatient Encounter Medications as of 10/25/2018  Medication Sig  . amLODipine (NORVASC) 10 MG tablet Take 10 mg by mouth daily.  Marland Kitchen aspirin 81 MG tablet Take 1 tablet (81 mg total) by mouth daily.  Marland Kitchen BYSTOLIC 10 MG tablet Take 10 mg by mouth daily.   . dorzolamide-timolol (COSOPT) 22.3-6.8 MG/ML ophthalmic solution Place 1 drop into both eyes 2 (two) times daily.  Marland Kitchen guaiFENesin-dextromethorphan (ROBITUSSIN DM) 100-10 MG/5ML syrup Take 5 mLs by mouth every 4 (four) hours as needed for cough.  . latanoprost (XALATAN) 0.005 % ophthalmic solution Place 1 drop into both eyes at bedtime.  Marland Kitchen losartan-hydrochlorothiazide (HYZAAR) 100-12.5 MG tablet Take 1 tablet by mouth daily.  Marland Kitchen senna (SENOKOT) 8.6 MG tablet Take 1 tablet by mouth daily.  . sildenafil (REVATIO) 20 MG tablet Take 20 mg by mouth 3 (three) times daily.  . tamsulosin (FLOMAX) 0.4 MG CAPS capsule Take 0.4 mg by mouth.  . Netarsudil-Latanoprost (ROCKLATAN) 0.02-0.005 % SOLN Apply to eye.  . pantoprazole (PROTONIX) 40 MG tablet Take 40 mg by mouth daily.  . Tdap (BOOSTRIX) 5-2.5-18.5 LF-MCG/0.5 injection Inject 0.5 mLs into the muscle once for 1 dose.   No facility-administered encounter medications on file as of 10/25/2018.     Activities of Daily Living In your present state of health, do you have any difficulty performing the following activities: 10/25/2018  Hearing? N  Vision? Y  Comment has glaucoma  Difficulty concentrating or making decisions? Y  Comment some forgetfulness  Walking or climbing stairs? N  Dressing or bathing? N  Doing errands, shopping? N  Preparing Food and eating ? N  Using the Toilet? N  In the past six months, have you accidently leaked urine? Y  Comment sees urologist  Do you have problems with loss of bowel control? N  Managing your Medications? N  Managing your Finances? N  Housekeeping or managing your Housekeeping? N  Some recent data might be hidden    Patient Care Team: Glendale Chard, MD as PCP - General (Internal Medicine) Johnathan Hausen, MD as Consulting Physician (General Surgery) Carol Ada, MD as Consulting Physician (Gastroenterology) Jamal Maes, MD as Consulting Physician (Nephrology) Carolan Clines, MD (Inactive)  as Consulting Physician (Urology) Charolette Forward, MD as Consulting Physician (Cardiology) Warden Fillers, MD as Consulting Physician (Ophthalmology)   Assessment:   This is a routine wellness examination for [place].  Exercise Activities and Dietary recommendations Current Exercise Habits: Home exercise routine, Type of exercise: walking, Time (Minutes): 10, Frequency (Times/Week): 2, Weekly Exercise (Minutes/Week): 20  Goals    . Patient Stated     Just to live as long as he can       Fall Risk Fall Risk  10/25/2018 06/15/2018 03/21/2018 04/12/2014  Falls in the past year? 0 0 No No  Risk for fall due to : Medication side effect - - -  Follow up Falls prevention discussed - - -   Is the patient's home free of loose throw rugs  in walkways, pet beds, electrical cords, etc?   yes      Grab bars in the bathroom? no      Handrails on the stairs?   yes      Adequate lighting?   yes  Timed Get Up and Go Performed: n/a  Depression Screen PHQ 2/9 Scores 10/25/2018 06/15/2018 03/21/2018  PHQ - 2 Score 0 0 0  PHQ- 9 Score 0 - -    Cognitive Function     6CIT Screen 10/25/2018  What Year? 0 points  What month? 0 points  What time? 0 points  Count back from 20 0 points  Months in reverse 2 points  Repeat phrase 0 points  Total Score 2     There is no immunization history on file for this patient.  Qualifies for Shingles Vaccine? yes  Screening Tests Health Maintenance  Topic Date Due  . TETANUS/TDAP  03/30/1959  . PNA vac Low Risk Adult (1 of 2 - PCV13) 10/25/2019 (Originally 03/29/2005)  . INFLUENZA VACCINE  12/24/2018   Cancer Screenings: Lung: Low Dose CT Chest recommended if Age 3-80 years, 30 pack-year currently smoking OR have quit w/in 15years. Patient does not qualify. Colorectal: up to date  Additional Screenings:  Hepatitis C Screening:n/a      Plan:   Does not get flu vaccine. TDAP sent to pharmacy.  I have personally reviewed and noted the following in  the patient's chart:   . Medical and social history . Use of alcohol, tobacco or illicit drugs  . Current medications and supplements . Functional ability and status . Nutritional status . Physical activity . Advanced directives . List of other physicians . Hospitalizations, surgeries, and ER visits in previous 12 months . Vitals . Screenings to include cognitive, depression, and falls . Referrals and appointments  In addition, I have reviewed and discussed with patient certain preventive protocols, quality metrics, and best practice recommendations. A written personalized care plan for preventive services as well as general preventive health recommendations were provided to patient.     Kellie Simmering, LPN  07/03/9369

## 2018-11-16 DIAGNOSIS — I2510 Atherosclerotic heart disease of native coronary artery without angina pectoris: Secondary | ICD-10-CM | POA: Diagnosis not present

## 2018-11-16 DIAGNOSIS — Z8673 Personal history of transient ischemic attack (TIA), and cerebral infarction without residual deficits: Secondary | ICD-10-CM | POA: Diagnosis not present

## 2018-11-16 DIAGNOSIS — E785 Hyperlipidemia, unspecified: Secondary | ICD-10-CM | POA: Diagnosis not present

## 2018-11-16 DIAGNOSIS — I10 Essential (primary) hypertension: Secondary | ICD-10-CM | POA: Diagnosis not present

## 2019-01-04 ENCOUNTER — Ambulatory Visit: Payer: 59

## 2019-01-04 ENCOUNTER — Ambulatory Visit: Payer: 59 | Admitting: Internal Medicine

## 2019-01-09 ENCOUNTER — Other Ambulatory Visit: Payer: Self-pay

## 2019-01-09 ENCOUNTER — Ambulatory Visit (INDEPENDENT_AMBULATORY_CARE_PROVIDER_SITE_OTHER): Payer: Medicare Other | Admitting: Internal Medicine

## 2019-01-09 ENCOUNTER — Encounter: Payer: Self-pay | Admitting: Internal Medicine

## 2019-01-09 ENCOUNTER — Ambulatory Visit: Payer: Medicare Other | Admitting: Internal Medicine

## 2019-01-09 VITALS — BP 136/74 | HR 58 | Temp 98.2°F | Wt 196.2 lb

## 2019-01-09 DIAGNOSIS — N183 Chronic kidney disease, stage 3 unspecified: Secondary | ICD-10-CM

## 2019-01-09 DIAGNOSIS — Z79899 Other long term (current) drug therapy: Secondary | ICD-10-CM | POA: Diagnosis not present

## 2019-01-09 DIAGNOSIS — M25561 Pain in right knee: Secondary | ICD-10-CM | POA: Diagnosis not present

## 2019-01-09 DIAGNOSIS — G8929 Other chronic pain: Secondary | ICD-10-CM | POA: Diagnosis not present

## 2019-01-09 DIAGNOSIS — M25562 Pain in left knee: Secondary | ICD-10-CM | POA: Diagnosis not present

## 2019-01-09 DIAGNOSIS — E663 Overweight: Secondary | ICD-10-CM

## 2019-01-09 DIAGNOSIS — I129 Hypertensive chronic kidney disease with stage 1 through stage 4 chronic kidney disease, or unspecified chronic kidney disease: Secondary | ICD-10-CM | POA: Diagnosis not present

## 2019-01-09 DIAGNOSIS — Z23 Encounter for immunization: Secondary | ICD-10-CM

## 2019-01-09 MED ORDER — TETANUS-DIPHTH-ACELL PERTUSSIS 5-2.5-18.5 LF-MCG/0.5 IM SUSP: 0.5000 mL | Freq: Once | INTRAMUSCULAR | 0 refills | Status: AC

## 2019-01-09 NOTE — Patient Instructions (Signed)
Exercising to Stay Healthy To become healthy and stay healthy, it is recommended that you do moderate-intensity and vigorous-intensity exercise. You can tell that you are exercising at a moderate intensity if your heart starts beating faster and you start breathing faster but can still hold a conversation. You can tell that you are exercising at a vigorous intensity if you are breathing much harder and faster and cannot hold a conversation while exercising. Exercising regularly is important. It has many health benefits, such as:  Improving overall fitness, flexibility, and endurance.  Increasing bone density.  Helping with weight control.  Decreasing body fat.  Increasing muscle strength.  Reducing stress and tension.  Improving overall health. How often should I exercise? Choose an activity that you enjoy, and set realistic goals. Your health care provider can help you make an activity plan that works for you. Exercise regularly as told by your health care provider. This may include:  Doing strength training two times a week, such as: ? Lifting weights. ? Using resistance bands. ? Push-ups. ? Sit-ups. ? Yoga.  Doing a certain intensity of exercise for a given amount of time. Choose from these options: ? A total of 150 minutes of moderate-intensity exercise every week. ? A total of 75 minutes of vigorous-intensity exercise every week. ? A mix of moderate-intensity and vigorous-intensity exercise every week. Children, pregnant women, people who have not exercised regularly, people who are overweight, and older adults may need to talk with a health care provider about what activities are safe to do. If you have a medical condition, be sure to talk with your health care provider before you start a new exercise program. What are some exercise ideas? Moderate-intensity exercise ideas include:  Walking 1 mile (1.6 km) in about 15 minutes.  Biking.  Hiking.  Golfing.  Dancing.   Water aerobics. Vigorous-intensity exercise ideas include:  Walking 4.5 miles (7.2 km) or more in about 1 hour.  Jogging or running 5 miles (8 km) in about 1 hour.  Biking 10 miles (16.1 km) or more in about 1 hour.  Lap swimming.  Roller-skating or in-line skating.  Cross-country skiing.  Vigorous competitive sports, such as football, basketball, and soccer.  Jumping rope.  Aerobic dancing. What are some everyday activities that can help me to get exercise?  Yard work, such as: ? Pushing a lawn mower. ? Raking and bagging leaves.  Washing your car.  Pushing a stroller.  Shoveling snow.  Gardening.  Washing windows or floors. How can I be more active in my day-to-day activities?  Use stairs instead of an elevator.  Take a walk during your lunch break.  If you drive, park your car farther away from your work or school.  If you take public transportation, get off one stop early and walk the rest of the way.  Stand up or walk around during all of your indoor phone calls.  Get up, stretch, and walk around every 30 minutes throughout the day.  Enjoy exercise with a friend. Support to continue exercising will help you keep a regular routine of activity. What guidelines can I follow while exercising?  Before you start a new exercise program, talk with your health care provider.  Do not exercise so much that you hurt yourself, feel dizzy, or get very short of breath.  Wear comfortable clothes and wear shoes with good support.  Drink plenty of water while you exercise to prevent dehydration or heat stroke.  Work out until your breathing   and your heartbeat get faster. Where to find more information  [place] Department of Health and Human Services: www.hhs.gov  Centers for Disease Control and Prevention (CDC): www.cdc.gov Summary  Exercising regularly is important. It will improve your overall fitness, flexibility, and endurance.  Regular exercise also will  improve your overall health. It can help you control your weight, reduce stress, and improve your bone density.  Do not exercise so much that you hurt yourself, feel dizzy, or get very short of breath.  Before you start a new exercise program, talk with your health care provider. This information is not intended to replace advice given to you by your health care provider. Make sure you discuss any questions you have with your health care provider. Document Released: 06/13/2010 Document Revised: 04/23/2017 Document Reviewed: 04/01/2017 Elsevier Patient Education  2020 Elsevier Inc.  

## 2019-01-09 NOTE — Progress Notes (Signed)
Subjective:     Patient ID: Lawrence Bailey , male    DOB: [birth year] , 78 y.o.   MRN: [id number]   Chief Complaint  Patient presents with  . Hypertension  . Immunizations    Tdap    HPI  Hypertension This is a chronic problem. The current episode started more than 1 year ago. The problem has been gradually improving since onset. The problem is controlled. Pertinent negatives include no blurred vision, chest pain, palpitations or shortness of breath. Risk factors for coronary artery disease include male gender and sedentary lifestyle. Past treatments include beta blockers and calcium channel blockers. The current treatment provides moderate improvement. Hypertensive end-organ damage includes kidney disease.     Past Medical History:  Diagnosis Date  . BPH (benign prostatic hyperplasia)   . Coronary artery disease   . 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 Glaucoma   . Hypertension   . Renal insufficiency      Family History  Problem Relation Age of Onset  . Hypertension Mother      Current Outpatient Medications:  .  amLODipine (NORVASC) 10 MG tablet, Take 10 mg by mouth daily., Disp: , Rfl: 3 .  aspirin 81 MG tablet, Take 1 tablet (81 mg total) by mouth daily., Disp: 30 tablet, Rfl:  .  BYSTOLIC 10 MG tablet, Take 10 mg by mouth daily., Disp: , Rfl:  .  dorzolamide-timolol (COSOPT) 22.3-6.8 MG/ML ophthalmic solution, Place 1 drop into both eyes 2 (two) times daily., Disp: , Rfl: 0 .  guaiFENesin-dextromethorphan (ROBITUSSIN DM) 100-10 MG/5ML syrup, Take 5 mLs by mouth every 4 (four) hours as needed for cough., Disp: , Rfl:  .  latanoprost (XALATAN) 0.005 % ophthalmic solution, Place 1 drop into both eyes at bedtime., Disp: , Rfl:  .  losartan-hydrochlorothiazide (HYZAAR) 100-12.5 MG tablet, Take 1 tablet by mouth daily., Disp: , Rfl:  .  pantoprazole (PROTONIX) 40 MG tablet, Take 40 mg by mouth daily., Disp: , Rfl:  1 .  senna (SENOKOT) 8.6 MG tablet, Take 1 tablet by mouth daily., Disp: , Rfl:  .  sildenafil (REVATIO) 20 MG tablet, Take 20 mg by mouth 3 (three) times daily., Disp: , Rfl:  .  tamsulosin (FLOMAX) 0.4 MG CAPS capsule, Take 0.4 mg by mouth., Disp: , Rfl:  .  Tdap (BOOSTRIX) 5-2.5-18.5 LF-MCG/0.5 injection, Inject 0.5 mLs into the muscle once for 1 dose., Disp: 0.5 mL, Rfl: 0   Allergies  Allergen Reactions  . Other     Bee sting      Review of Systems  Constitutional: Negative.   Eyes: Negative for blurred vision.  Respiratory: Negative.  Negative for shortness of breath.   Cardiovascular: Negative.  Negative for chest pain and palpitations.  Gastrointestinal: Negative.   Musculoskeletal: Positive for arthralgias.       He c/o b/l knee pain. Intermittent, dull pain. Denies fall/trauma.  Admits he has not been exercising regularly. Has not taken anything.   Neurological: Negative.   Psychiatric/Behavioral: Negative.      Today's Vitals   01/09/19 1411  BP: 136/74  Pulse: (!) 58  Temp: 98.2 F (36.8 C)  TempSrc: Oral  Weight: 196 lb 3.2 oz (89 kg)  PainSc: 0-No pain   Body mass index is 27.36 kg/m.   Objective:  Physical Exam Vitals signs and nursing note reviewed.  Constitutional:      Appearance: Normal appearance.  Cardiovascular:  Rate and Rhythm: Normal rate and regular rhythm.     Heart sounds: Normal heart sounds.  Pulmonary:     Effort: Pulmonary effort is normal.     Breath sounds: Normal breath sounds.  Skin:    General: Skin is warm.  Neurological:     General: No focal deficit present.     Mental Status: He is alert.  Psychiatric:        Mood and Affect: Mood normal.         Assessment And Plan:     1. Hypertensive nephropathy  Fair control.  He will continue with current meds for now. Pt advised optimal bp is less than 130/80. He is encouraged to limit his salt intake. He will rto in 4-6 months for re-evaluation.   - CMP14+EGFR - CBC  no Diff  2. Chronic renal disease, stage III (HCC)  Chronic. I will check GFR, Cr today. Importance of adequate hydration was discussed with the patient.   3. Chronic pain of both knees  Chronic. Likely due to osteoarthritis.  He is advised to try topical pain cream as needed. He will let me know if his sx persist.   4. Overweight (BMI 25.0-29.9)  He is encouraged to incorporate more exercise into his daily routine. He is advised to strive for 150 minutes per week of regular exercise.   5. Drug therapy  - Vitamin B12  6. Need for vaccination  Rx Boostrix - Tdap was sent to the pharmacy. He is encouraged to contact me to notify me once he has received the vaccine so his chart can be updated.   [name], MD    THE PATIENT IS ENCOURAGED TO PRACTICE SOCIAL DISTANCING DUE TO THE COVID-19 PANDEMIC.

## 2019-01-10 LAB — CMP14+EGFR
ALT: 12 IU/L (ref 0–44)
AST: 15 IU/L (ref 0–40)
Albumin/Globulin Ratio: 1.1 — ABNORMAL LOW (ref 1.2–2.2)
Albumin: 3.8 g/dL (ref 3.7–4.7)
Alkaline Phosphatase: 54 IU/L (ref 39–117)
BUN/Creatinine Ratio: 15 (ref 10–24)
BUN: 22 mg/dL (ref 8–27)
Bilirubin Total: 0.4 mg/dL (ref 0.0–1.2)
CO2: 24 mmol/L (ref 20–29)
Calcium: 9.1 mg/dL (ref 8.6–10.2)
Chloride: 103 mmol/L (ref 96–106)
Creatinine, Ser: 1.51 mg/dL — ABNORMAL HIGH (ref 0.76–1.27)
GFR calc Af Amer: 50 mL/min/{1.73_m2} — ABNORMAL LOW (ref >59)
GFR calc non Af Amer: 44 mL/min/{1.73_m2} — ABNORMAL LOW (ref >59)
Globulin, Total: 3.5 g/dL (ref 1.5–4.5)
Glucose: 92 mg/dL (ref 65–99)
Potassium: 3.7 mmol/L (ref 3.5–5.2)
Sodium: 140 mmol/L (ref 134–144)
Total Protein: 7.3 g/dL (ref 6.0–8.5)

## 2019-01-10 LAB — CBC
Hematocrit: 35.2 % — ABNORMAL LOW (ref 37.5–51.0)
Hemoglobin: 11.5 g/dL — ABNORMAL LOW (ref 13.0–17.7)
MCH: 27 pg (ref 26.6–33.0)
MCHC: 32.7 g/dL (ref 31.5–35.7)
MCV: 83 fL (ref 79–97)
Platelets: 139 10*3/uL — ABNORMAL LOW (ref 150–450)
RBC: 4.26 x10E6/uL (ref 4.14–5.80)
RDW: 14.3 % (ref 11.6–15.4)
WBC: 5.1 10*3/uL (ref 3.4–10.8)

## 2019-01-10 LAB — VITAMIN B12: Vitamin B-12: 1459 pg/mL — ABNORMAL HIGH (ref 232–1245)

## 2019-01-13 LAB — PHOSPHORUS: Phosphorus: 3 mg/dL (ref 2.8–4.1)

## 2019-01-13 LAB — PROTEIN ELECTROPHORESIS
A/G Ratio: 1 (ref 0.7–1.7)
Albumin ELP: 3.6 g/dL (ref 2.9–4.4)
Alpha 1: 0.2 g/dL (ref 0.0–0.4)
Alpha 2: 0.5 g/dL (ref 0.4–1.0)
Beta: 0.7 g/dL (ref 0.7–1.3)
Gamma Globulin: 2 g/dL — ABNORMAL HIGH (ref 0.4–1.8)
Globulin, Total: 3.5 g/dL (ref 2.2–3.9)
M-Spike, %: 1.5 g/dL — ABNORMAL HIGH
Total Protein: 7.1 g/dL (ref 6.0–8.5)

## 2019-01-13 LAB — SPECIMEN STATUS REPORT

## 2019-01-16 ENCOUNTER — Encounter: Payer: Self-pay | Admitting: Internal Medicine

## 2019-01-16 ENCOUNTER — Telehealth: Payer: Self-pay

## 2019-01-16 DIAGNOSIS — R771 Abnormality of globulin: Secondary | ICD-10-CM

## 2019-01-16 NOTE — Telephone Encounter (Signed)
The pt was notified and agrees to the referral.  The referral order has been placed.

## 2019-01-16 NOTE — Telephone Encounter (Signed)
-----   Message from Glendale Chard, MD sent at 01/14/2019 10:51 AM EDT ----- I did some extra bloodwork due to decreased kidney function.  One of the tests is abnormal. I would like to refer you to a hematologist, blood specialist for further evaluation of the elevated M-spike. This implies there could be an issue within his blood. He should be aware that hematologists work as [facility] as well. If he agrees to referral, please send him to Dr. Marin Olp, for further evaluation. Dx: elevated globulin. Let him there are excess proteins in his blood.

## 2019-02-01 ENCOUNTER — Other Ambulatory Visit: Payer: Self-pay | Admitting: *Deleted

## 2019-02-01 ENCOUNTER — Telehealth: Payer: Self-pay | Admitting: Nurse Practitioner

## 2019-02-01 DIAGNOSIS — Z8509 Personal history of malignant neoplasm of other digestive organs: Secondary | ICD-10-CM

## 2019-02-01 NOTE — Telephone Encounter (Signed)
Scheduled appt per 9/9 sch message - pt is aware of appt date and time

## 2019-02-01 NOTE — Progress Notes (Signed)
Per Dr. Benay Spice: Needs lab/OV with NP on 9/14 at 2:15 pm for new oncology workup. Scheduling message sent.

## 2019-02-06 ENCOUNTER — Encounter: Payer: Self-pay | Admitting: Nurse Practitioner

## 2019-02-06 ENCOUNTER — Other Ambulatory Visit: Payer: Self-pay

## 2019-02-06 ENCOUNTER — Inpatient Hospital Stay: Payer: Medicare Other

## 2019-02-06 ENCOUNTER — Inpatient Hospital Stay: Payer: Medicare Other | Attending: Nurse Practitioner | Admitting: Nurse Practitioner

## 2019-02-06 VITALS — BP 140/83 | HR 65 | Temp 98.5°F | Resp 17 | Ht 71.0 in | Wt 196.8 lb

## 2019-02-06 DIAGNOSIS — Z8509 Personal history of malignant neoplasm of other digestive organs: Secondary | ICD-10-CM

## 2019-02-06 DIAGNOSIS — Z86718 Personal history of other venous thrombosis and embolism: Secondary | ICD-10-CM | POA: Diagnosis not present

## 2019-02-06 DIAGNOSIS — D472 Monoclonal gammopathy: Secondary | ICD-10-CM

## 2019-02-06 DIAGNOSIS — I129 Hypertensive chronic kidney disease with stage 1 through stage 4 chronic kidney disease, or unspecified chronic kidney disease: Secondary | ICD-10-CM | POA: Diagnosis not present

## 2019-02-06 DIAGNOSIS — N189 Chronic kidney disease, unspecified: Secondary | ICD-10-CM | POA: Insufficient documentation

## 2019-02-06 LAB — CBC WITH DIFFERENTIAL (CANCER CENTER ONLY)
Abs Immature Granulocytes: 0.01 10*3/uL (ref 0.00–0.07)
Basophils Absolute: 0 10*3/uL (ref 0.0–0.1)
Basophils Relative: 0 %
Eosinophils Absolute: 0.1 10*3/uL (ref 0.0–0.5)
Eosinophils Relative: 1 %
HCT: 36.4 % — ABNORMAL LOW (ref 39.0–52.0)
Hemoglobin: 11.3 g/dL — ABNORMAL LOW (ref 13.0–17.0)
Immature Granulocytes: 0 %
Lymphocytes Relative: 27 %
Lymphs Abs: 1.3 10*3/uL (ref 0.7–4.0)
MCH: 27 pg (ref 26.0–34.0)
MCHC: 31 g/dL (ref 30.0–36.0)
MCV: 86.9 fL (ref 80.0–100.0)
Monocytes Absolute: 0.4 10*3/uL (ref 0.1–1.0)
Monocytes Relative: 7 %
Neutro Abs: 3.2 10*3/uL (ref 1.7–7.7)
Neutrophils Relative %: 65 %
Platelet Count: 137 10*3/uL — ABNORMAL LOW (ref 150–400)
RBC: 4.19 MIL/uL — ABNORMAL LOW (ref 4.22–5.81)
RDW: 14.2 % (ref 11.5–15.5)
WBC Count: 5 10*3/uL (ref 4.0–10.5)
nRBC: 0 % (ref 0.0–0.2)

## 2019-02-06 NOTE — Progress Notes (Addendum)
[facility] OFFICE PROGRESS NOTE   Diagnosis: Gastrointestinal stromal tumor  INTERVAL HISTORY:   Lawrence Bailey returns prior to scheduled follow-up to discuss the presence of a serum M spike on blood work from 01/09/2019.  He overall feels well.  No recent illnesses or infections.  No fevers or sweats.  He has a good appetite.  No weight loss.  He denies significant pain.  He occasionally has pain in his back.  Periodic gum bleeding.  He had mild stable dyspnea on exertion.  No cough.  Intermittent constipation.  No urinary symptoms.  Mother deceased with cancer.  He is unsure of the cancer type.  He lives in [place].  He is married.  He has 12 children.  No tobacco or alcohol use.  He is retired from [employer] work.  Objective:  Vital signs in last 24 hours:  Blood pressure 140/83, pulse 65, temperature 98.5 F (36.9 C), temperature source Oral, resp. rate 17, height 5' 11"  (1.803 m), weight 196 lb 12.8 oz (89.3 kg), SpO2 100 %.    HEENT: Neck without mass. Lymphatics: No palpable cervical, supraclavicular, axillary or inguinal lymph nodes. Resp: Lungs clear bilaterally. Cardio: Regular rate and rhythm. GI: Abdomen soft and nontender.  No hepatosplenomegaly. Vascular: No leg edema. Neuro: Alert and oriented. Skin: No rash.   Lab Results:  Lab Results  Component Value Date   WBC 5.0 02/06/2019   HGB 11.3 (L) 02/06/2019   HCT 36.4 (L) 02/06/2019   MCV 86.9 02/06/2019   PLT 137 (L) 02/06/2019   NEUTROABS 3.2 02/06/2019    Imaging:  No results found.  Medications: I have reviewed the patient's current medications.  Assessment/Plan: 1. 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 Pathology showed a 6 cm GI stromal tumor. No vascular or lymphatic invasion. All surgical margins negative. Lymph nodes not sampled. Tumor confined to the submucosa. Mitotic activity 25-50 mitoses  per high-power field. He completed 1 year of Gleevec 400 mg daily. Restaging CT abdomen/pelvis 04/10/2014 with no evidence of recurrent/metastatic disease.  CT abdomen/pelvis 05/12/2018 without evidence of local recurrence or metastatic disease. 2. History of right lower extremity DVT June 2018. 3. Evaluation in the emergency department 03/08/2018 for right leg redness, swelling and tenderness. Venous Doppler negative for DVT. Incidental finding of a possible 3.3 cm right inguinal lymph node.  CT abdomen/pelvis 05/12/2018 showed stable small inguinal lymph nodes bilaterally. 4. Chronic renal failure 5. Monoclonal gammopathy of unknown significance  01/09/2019 SPEP with M spike 1.5  6. Hypertension  Disposition: Lawrence Bailey appears stable.  He remains in clinical remission from the gastrointestinal stromal tumor.  He was recently referred back to our office for a sooner appointment due to a serum M spike.  We discussed that this likely represents a monoclonal gammopathy of unknown significance.  We discussed the possibility of multiple myeloma.  We are obtaining immunofixation electrophoresis, immunoglobulin levels and serum light chains.  He is scheduled to return for follow-up in December of this year.  We will adjust that appointment accordingly pending results of the above labs.  Patient seen with Dr. Benay Spice.  25 minutes were spent face-to-face at today's visit with the majority of that time involved in counseling/coordination of care.    Ned Card ANP/GNP-BC   02/06/2019  2:24 PM  This was a shared visit with Ned Card.  Lawrence Bailey was interviewed and examined.  He was referred for evaluation of a recently  discovered monoclonal gammopathy.  He does not have symptoms or laboratory findings to suggest a diagnosis of multiple myeloma.  No evidence of another lymphoproliferative disorder.  He has chronic renal insufficiency.  We will obtain additional testing to include a serum  immunofixation, serum free light chain analysis, and quantitative immunoglobulins.  An office visit laboratory studies in approximately 4 months.  Lawrence Manson, MD

## 2019-02-15 DIAGNOSIS — I10 Essential (primary) hypertension: Secondary | ICD-10-CM | POA: Diagnosis not present

## 2019-02-15 DIAGNOSIS — I639 Cerebral infarction, unspecified: Secondary | ICD-10-CM | POA: Diagnosis not present

## 2019-02-15 DIAGNOSIS — I2510 Atherosclerotic heart disease of native coronary artery without angina pectoris: Secondary | ICD-10-CM | POA: Diagnosis not present

## 2019-02-15 DIAGNOSIS — E785 Hyperlipidemia, unspecified: Secondary | ICD-10-CM | POA: Diagnosis not present

## 2019-03-13 ENCOUNTER — Other Ambulatory Visit: Payer: Self-pay | Admitting: Internal Medicine

## 2019-03-31 ENCOUNTER — Other Ambulatory Visit: Payer: Self-pay

## 2019-03-31 DIAGNOSIS — Z20822 Contact with and (suspected) exposure to covid-19: Secondary | ICD-10-CM

## 2019-04-01 LAB — NOVEL CORONAVIRUS, NAA: SARS-CoV-2, NAA: NOT DETECTED

## 2019-04-04 ENCOUNTER — Telehealth: Payer: Self-pay | Admitting: General Practice

## 2019-04-04 NOTE — Telephone Encounter (Signed)
Negative COVID results given. Patient results "NOT Detected." Caller expressed understanding. ° °

## 2019-04-25 DEATH — deceased

## 2019-05-05 ENCOUNTER — Other Ambulatory Visit: Payer: Self-pay

## 2019-05-05 DIAGNOSIS — Z20822 Contact with and (suspected) exposure to covid-19: Secondary | ICD-10-CM

## 2019-05-07 LAB — NOVEL CORONAVIRUS, NAA: SARS-CoV-2, NAA: NOT DETECTED

## 2019-05-15 ENCOUNTER — Telehealth: Payer: Self-pay | Admitting: *Deleted

## 2019-05-15 DIAGNOSIS — I2510 Atherosclerotic heart disease of native coronary artery without angina pectoris: Secondary | ICD-10-CM | POA: Diagnosis not present

## 2019-05-15 DIAGNOSIS — I639 Cerebral infarction, unspecified: Secondary | ICD-10-CM | POA: Diagnosis not present

## 2019-05-15 DIAGNOSIS — I10 Essential (primary) hypertension: Secondary | ICD-10-CM | POA: Diagnosis not present

## 2019-05-15 DIAGNOSIS — E785 Hyperlipidemia, unspecified: Secondary | ICD-10-CM | POA: Diagnosis not present

## 2019-05-15 NOTE — Telephone Encounter (Signed)
Pt's wife, Joelene Millin asked if our office took care of bone spurs and coarse toenails.

## 2019-05-15 NOTE — Telephone Encounter (Signed)
I informed pt's wife, our doctors take care of all aspects of the foot and ankle.

## 2019-05-22 ENCOUNTER — Ambulatory Visit (INDEPENDENT_AMBULATORY_CARE_PROVIDER_SITE_OTHER): Payer: Medicare Other

## 2019-05-22 ENCOUNTER — Other Ambulatory Visit: Payer: Self-pay

## 2019-05-22 ENCOUNTER — Encounter: Payer: Self-pay | Admitting: Podiatry

## 2019-05-22 ENCOUNTER — Ambulatory Visit: Payer: Medicare Other | Admitting: Podiatry

## 2019-05-22 ENCOUNTER — Other Ambulatory Visit: Payer: Self-pay | Admitting: Podiatry

## 2019-05-22 DIAGNOSIS — M79675 Pain in left toe(s): Secondary | ICD-10-CM

## 2019-05-22 DIAGNOSIS — M779 Enthesopathy, unspecified: Secondary | ICD-10-CM

## 2019-05-22 DIAGNOSIS — B351 Tinea unguium: Secondary | ICD-10-CM | POA: Diagnosis not present

## 2019-05-22 DIAGNOSIS — M79674 Pain in right toe(s): Secondary | ICD-10-CM | POA: Diagnosis not present

## 2019-05-22 DIAGNOSIS — M79672 Pain in left foot: Secondary | ICD-10-CM

## 2019-05-22 DIAGNOSIS — L84 Corns and callosities: Secondary | ICD-10-CM

## 2019-05-24 NOTE — Progress Notes (Signed)
Subjective:   Patient ID: Lawrence Bailey, male   DOB: 78 y.o.   MRN: [id number]   HPI Patient presents stating that he has developed a lot of pain on top of his right foot and has chronic nails of disease that he cannot take care of himself and they are thick and dystrophic and impossible for him to cut along with lesions on his big toes of both feet that are becoming increasingly painful and he cannot take care of.  Patient does not smoke and would like to be more active   Review of Systems  All other systems reviewed and are negative.       Objective:  Physical Exam Vitals and nursing note reviewed.  Constitutional:      Appearance: He is well-developed.  Pulmonary:     Effort: Pulmonary effort is normal.  Musculoskeletal:        General: Normal range of motion.  Skin:    General: Skin is warm.  Neurological:     Mental Status: He is alert.     Neurovascular status was found to be intact with muscle strength found to be within normal limits with mild limitation of motion subtalar midtarsal joint.  Equinus condition noted bilateral with patient noted to have thick yellow brittle nailbeds 1-5 both feet that are painful when palpated and impossible for him to cut with patient having history of kidney disease.  Patient has inflammation and pain dorsum right foot with fluid buildup and is noted to have keratotic lesions hallux bilateral that are painful.  Patient has good digital perfusion well oriented x3     Assessment:  Acute tendinitis dorsal right with inflammation noted along with chronic mycotic nail infection with pain and lesion formation bilateral with pain     Plan:  H&P all conditions reviewed and today I went ahead did sterile prep and injected the dorsal tendon complex right 3 mg Kenalog 5 mg Xylocaine and I debrided nailbeds 1-5 both feet with no iatrogenic bleeding and lesions on both feet with no iatrogenic bleeding and reappoint for routine care  X-rays indicate  that there is no signs of stress fracture with moderate arthritis subtalar midtarsal joint right

## 2019-05-25 ENCOUNTER — Inpatient Hospital Stay: Payer: Medicare Other

## 2019-05-25 ENCOUNTER — Other Ambulatory Visit: Payer: Self-pay

## 2019-05-25 ENCOUNTER — Inpatient Hospital Stay: Payer: Medicare Other | Attending: Oncology | Admitting: Oncology

## 2019-05-25 VITALS — BP 157/69 | HR 60 | Temp 98.3°F | Resp 16 | Ht 71.0 in | Wt 195.6 lb

## 2019-05-25 DIAGNOSIS — D472 Monoclonal gammopathy: Secondary | ICD-10-CM | POA: Diagnosis not present

## 2019-05-25 DIAGNOSIS — C49A0 Gastrointestinal stromal tumor, unspecified site: Secondary | ICD-10-CM | POA: Insufficient documentation

## 2019-05-25 DIAGNOSIS — N189 Chronic kidney disease, unspecified: Secondary | ICD-10-CM | POA: Diagnosis not present

## 2019-05-25 DIAGNOSIS — Z86718 Personal history of other venous thrombosis and embolism: Secondary | ICD-10-CM | POA: Insufficient documentation

## 2019-05-25 DIAGNOSIS — I129 Hypertensive chronic kidney disease with stage 1 through stage 4 chronic kidney disease, or unspecified chronic kidney disease: Secondary | ICD-10-CM | POA: Diagnosis not present

## 2019-05-25 LAB — CBC WITH DIFFERENTIAL (CANCER CENTER ONLY)
Abs Immature Granulocytes: 0.01 10*3/uL (ref 0.00–0.07)
Basophils Absolute: 0 10*3/uL (ref 0.0–0.1)
Basophils Relative: 0 %
Eosinophils Absolute: 0 10*3/uL (ref 0.0–0.5)
Eosinophils Relative: 0 %
HCT: 38.8 % — ABNORMAL LOW (ref 39.0–52.0)
Hemoglobin: 12.3 g/dL — ABNORMAL LOW (ref 13.0–17.0)
Immature Granulocytes: 0 %
Lymphocytes Relative: 21 %
Lymphs Abs: 1.3 10*3/uL (ref 0.7–4.0)
MCH: 27.1 pg (ref 26.0–34.0)
MCHC: 31.7 g/dL (ref 30.0–36.0)
MCV: 85.5 fL (ref 80.0–100.0)
Monocytes Absolute: 0.4 10*3/uL (ref 0.1–1.0)
Monocytes Relative: 7 %
Neutro Abs: 4.4 10*3/uL (ref 1.7–7.7)
Neutrophils Relative %: 72 %
Platelet Count: 146 10*3/uL — ABNORMAL LOW (ref 150–400)
RBC: 4.54 MIL/uL (ref 4.22–5.81)
RDW: 14.5 % (ref 11.5–15.5)
WBC Count: 6.1 10*3/uL (ref 4.0–10.5)
nRBC: 0 % (ref 0.0–0.2)

## 2019-05-25 LAB — CMP (CANCER CENTER ONLY)
ALT: 14 U/L (ref 0–44)
AST: 19 U/L (ref 15–41)
Albumin: 4 g/dL (ref 3.5–5.0)
Alkaline Phosphatase: 51 U/L (ref 38–126)
Anion gap: 7 (ref 5–15)
BUN: 23 mg/dL (ref 8–23)
CO2: 27 mmol/L (ref 22–32)
Calcium: 9.1 mg/dL (ref 8.9–10.3)
Chloride: 105 mmol/L (ref 98–111)
Creatinine: 1.37 mg/dL — ABNORMAL HIGH (ref 0.61–1.24)
GFR, Est AFR Am: 56 mL/min — ABNORMAL LOW (ref >60)
GFR, Estimated: 49 mL/min — ABNORMAL LOW (ref >60)
Glucose, Bld: 93 mg/dL (ref 70–99)
Potassium: 4 mmol/L (ref 3.5–5.1)
Sodium: 139 mmol/L (ref 135–145)
Total Bilirubin: 0.8 mg/dL (ref 0.3–1.2)
Total Protein: 8.2 g/dL — ABNORMAL HIGH (ref 6.5–8.1)

## 2019-05-25 NOTE — Progress Notes (Signed)
  [facility] OFFICE PROGRESS NOTE   Diagnosis: History of a gastrointestinal stromal tumor, serum monoclonal protein  INTERVAL HISTORY:   Lawrence Bailey returns for scheduled visit.  He feels well.  He is scheduled for a prostate biopsy next month for evaluation of an elevated PSA.  No recent infection.  Objective:  Vital signs in last 24 hours:  Blood pressure (!) 157/69, pulse 60, temperature 98.3 F (36.8 C), temperature source Temporal, resp. rate 16, height '5\' 11"'$  (1.803 m), weight 195 lb 9.6 oz (88.7 kg), SpO2 100 %.    HEENT: Left posterior neck lipoma (he reports this lesion has been present for years) Lymphatics: No cervical, supraclavicular, axillary, or inguinal nodes GI: Nontender, no mass, no hepatosplenomegaly Vascular: No leg edema    Portacath/PICC-without erythema  Lab Results:  Lab Results  Component Value Date   WBC 6.1 05/25/2019   HGB 12.3 (L) 05/25/2019   HCT 38.8 (L) 05/25/2019   MCV 85.5 05/25/2019   PLT 146 (L) 05/25/2019   NEUTROABS 4.4 05/25/2019    CMP  Lab Results  Component Value Date   NA 140 01/09/2019   K 3.7 01/09/2019   CL 103 01/09/2019   CO2 24 01/09/2019   GLUCOSE 92 01/09/2019   BUN 22 01/09/2019   CREATININE 1.51 (H) 01/09/2019   CALCIUM 9.1 01/09/2019   PROT 7.3 01/09/2019   PROT 7.1 01/09/2019   ALBUMIN 3.8 01/09/2019   AST 15 01/09/2019   ALT 12 01/09/2019   ALKPHOS 54 01/09/2019   BILITOT 0.4 01/09/2019   GFRNONAA 44 (L) 01/09/2019   GFRAA 50 (L) 01/09/2019      Medications: I have reviewed the patient's current medications.   Assessment/Plan: 1. 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 Pathology showed a 6 cm GI stromal tumor. No vascular or lymphatic invasion. All surgical margins negative. Lymph nodes not sampled. Tumor confined to the submucosa. Mitotic activity 25-50 mitoses per high-power field. He completed 1 year of Gleevec 400 mg  daily. Restaging CT abdomen/pelvis 04/10/2014 with no evidence of recurrent/metastatic disease.  CT abdomen/pelvis 05/12/2018 without evidence of local recurrence or metastatic disease. 2. History of right lower extremity DVT June 2018. 3. Evaluation in the emergency department 03/08/2018 for right leg redness, swelling and tenderness. Venous Doppler negative for DVT. Incidental finding of a possible 3.3 cm right inguinal lymph node.  CT abdomen/pelvis 05/12/2018 showed stable small inguinal lymph nodes bilaterally. 4. Chronic renal failure 5. Monoclonal gammopathy of unknown significance  01/09/2019 SPEP with M spike 1.5  6. Hypertension     Disposition: Lawrence Bailey is stable from a hematologic standpoint.  There is no clinical evidence for progression to multiple myeloma or another lymphoproliferative disorder.  He appears to have a monoclonal gammopathy of unknown significance.  We requested serum free light chains and immunofixation assay when he was here in August, these tests were not run.  They were requested again today.  We will follow-up Bailey the labs from today.  Lawrence Bailey remains in clinical remission from the gastrointestinal stromal tumor.   He will return for an office and lab visit in 8 months.  Betsy Coder, MD  05/25/2019  12:59 PM

## 2019-05-29 LAB — KAPPA/LAMBDA LIGHT CHAINS
Kappa free light chain: 30.7 mg/L — ABNORMAL HIGH (ref 3.3–19.4)
Kappa, lambda light chain ratio: 1.49 (ref 0.26–1.65)
Lambda free light chains: 20.6 mg/L (ref 5.7–26.3)

## 2019-05-31 LAB — PROTEIN ELECTROPHORESIS, SERUM
A/G Ratio: 1 (ref 0.7–1.7)
Albumin ELP: 3.8 g/dL (ref 2.9–4.4)
Alpha-1-Globulin: 0.2 g/dL (ref 0.0–0.4)
Alpha-2-Globulin: 0.5 g/dL (ref 0.4–1.0)
Beta Globulin: 0.9 g/dL (ref 0.7–1.3)
Gamma Globulin: 2.2 g/dL — ABNORMAL HIGH (ref 0.4–1.8)
Globulin, Total: 3.8 g/dL (ref 2.2–3.9)
M-Spike, %: 1.7 g/dL — ABNORMAL HIGH
Total Protein ELP: 7.6 g/dL (ref 6.0–8.5)

## 2019-06-05 LAB — IMMUNOFIXATION ELECTROPHORESIS
IgA: 360 mg/dL (ref 61–437)
IgG (Immunoglobin G), Serum: 4530 mg/dL — ABNORMAL HIGH (ref 603–1613)
IgM (Immunoglobulin M), Srm: 79 mg/dL (ref 15–143)

## 2019-06-19 ENCOUNTER — Other Ambulatory Visit: Payer: Self-pay | Admitting: Oncology

## 2019-06-19 DIAGNOSIS — D472 Monoclonal gammopathy: Secondary | ICD-10-CM

## 2019-06-29 ENCOUNTER — Telehealth: Payer: Self-pay

## 2019-06-29 ENCOUNTER — Other Ambulatory Visit: Payer: Self-pay | Admitting: Nurse Practitioner

## 2019-06-29 DIAGNOSIS — D472 Monoclonal gammopathy: Secondary | ICD-10-CM

## 2019-06-29 NOTE — Telephone Encounter (Signed)
Per Ned Card NP called patient to let him know that there was a discrepancy on his blood work from 05/25/2019. Dr. Benay Spice would like to have this repeated next week. I let him know that Lattie Haw has entered the lab orders and sent a schedule message for labs in 1 week so the schedulers will be calling him with that appointment date and time. Patient verbalized understanding. No further problems or concerns at this time.

## 2019-06-30 ENCOUNTER — Encounter: Payer: Self-pay | Admitting: *Deleted

## 2019-07-04 ENCOUNTER — Telehealth: Payer: Self-pay | Admitting: Nurse Practitioner

## 2019-07-04 NOTE — Telephone Encounter (Signed)
Scheduled appt per 2/4 sch message - pt aware of appt date and time

## 2019-07-05 ENCOUNTER — Other Ambulatory Visit: Payer: Self-pay

## 2019-07-05 ENCOUNTER — Inpatient Hospital Stay: Payer: Medicare Other | Attending: Oncology

## 2019-07-05 DIAGNOSIS — D472 Monoclonal gammopathy: Secondary | ICD-10-CM

## 2019-07-06 LAB — PROTEIN ELECTROPHORESIS, SERUM
A/G Ratio: 1 (ref 0.7–1.7)
Albumin ELP: 3.8 g/dL (ref 2.9–4.4)
Alpha-1-Globulin: 0.2 g/dL (ref 0.0–0.4)
Alpha-2-Globulin: 0.5 g/dL (ref 0.4–1.0)
Beta Globulin: 0.9 g/dL (ref 0.7–1.3)
Gamma Globulin: 2.5 g/dL — ABNORMAL HIGH (ref 0.4–1.8)
Globulin, Total: 4 g/dL — ABNORMAL HIGH (ref 2.2–3.9)
M-Spike, %: 1.9 g/dL — ABNORMAL HIGH
Total Protein ELP: 7.8 g/dL (ref 6.0–8.5)

## 2019-07-10 LAB — IMMUNOFIXATION ELECTROPHORESIS
IgA: 240 mg/dL (ref 61–437)
IgG (Immunoglobin G), Serum: 2950 mg/dL — ABNORMAL HIGH (ref 603–1613)
IgM (Immunoglobulin M), Srm: 56 mg/dL (ref 15–143)
Total Protein ELP: 7.6 g/dL (ref 6.0–8.5)

## 2019-07-11 ENCOUNTER — Other Ambulatory Visit: Payer: Self-pay

## 2019-07-11 ENCOUNTER — Ambulatory Visit
Admission: RE | Admit: 2019-07-11 | Discharge: 2019-07-11 | Disposition: A | Discharge status: Home / Self Care | Payer: Medicare Other | Source: Ambulatory Visit | Attending: Radiation Oncology | Admitting: Radiation Oncology

## 2019-07-11 ENCOUNTER — Encounter: Payer: Self-pay | Admitting: Radiation Oncology

## 2019-07-11 VITALS — Ht 71.0 in | Wt 185.0 lb

## 2019-07-11 DIAGNOSIS — C61 Malignant neoplasm of prostate: Secondary | ICD-10-CM | POA: Insufficient documentation

## 2019-07-11 HISTORY — DX: Malignant neoplasm of prostate: C61

## 2019-07-11 NOTE — Progress Notes (Signed)
Radiation Oncology         [phone]) [phone] ________________________________  Initial outpatient Consultation - Conducted via MyChart due to current COVID-19 concerns for limiting patient exposure  Name: Lawrence Bailey MRN: [id number]  Date: 07/11/2019  DOB: 1940-10-14  [id number], Bailey Mech, MD  Davis Gourd*   REFERRING PHYSICIAN: Davis Gourd*  DIAGNOSIS: 79 y.o. gentleman with Stage T1c adenocarcinoma of the prostate with Gleason score of 3+3, and PSA of 12.2.    ICD-10-CM   1. Malignant neoplasm of prostate ([place])  C61     HISTORY OF PRESENT ILLNESS: Lawrence Bailey is a 79 y.o. male with a diagnosis of prostate cancer. He also has a history of gastrointestinal stromal tumor in 2008, treated with partial gastrectomy in 04/2007 and one year of [place]. He has been followed by [facility] for BPH with BOO as well as an elevated and fluctuating PSA since 1993. He has undergone 5 prior biopsies in 08/1991, 09/1999, 12/2005, 11/2007, and 03/2009, all performed by Dr. Gaynelle Arabian and all were negative for malignancy. His care was subsequently transferred to Dr. Lovena Neighbours in 2019 with Dr. Arlyn Leak retirement. He continues on tamsulosin for his urinary symptoms. He has also been on TRT in the past but not in recent years.  Most recently, his PSA has been noted to be steadily increasing from 6.09 in 04/2018 to 8.4 in 09/2018 and most recently to 12.2 in 04/2019. Therefore, he underwent a repeat biopsy on 06/21/2018. Prostate volume measured 94.6 cc. Out of 12 core biopsies, a small focus of Gleason 3+3 was seen in right mid lateral.  The patient reviewed the biopsy results with his urologist and he has kindly been referred today for discussion of potential radiation treatment options.   PREVIOUS RADIATION THERAPY: No  PAST MEDICAL HISTORY:  Past Medical History:  Diagnosis Date  . BPH (benign prostatic hyperplasia)   . Coronary artery disease   . GIST (gastrointestinal  stromal tumor), malignant ([place]) dx'd 04/2007   gleevac comp 04/2008  . Glaucoma   . Hypertension   . Prostate cancer ([place])   . Renal insufficiency       PAST SURGICAL HISTORY: Past Surgical History:  Procedure Laterality Date  . ABDOMINAL SURGERY    . EYE SURGERY    . HERNIA REPAIR    . KNEE SURGERY      FAMILY HISTORY:  Family History  Problem Relation Age of Onset  . Hypertension Mother   . Cancer Mother   . Breast cancer Neg Hx   . Prostate cancer Neg Hx   . Colon cancer Neg Hx   . Pancreatic cancer Neg Hx     SOCIAL HISTORY:  Social History   Socioeconomic History  . Marital status: Married    Spouse name: Not on file  . Number of children: Not on file  . Years of education: Not on file  . Highest education level: Not on file  Occupational History  . Occupation: retired  Tobacco Use  . Smoking status: Never Smoker  . Smokeless tobacco: Never Used  Substance and Sexual Activity  . Alcohol use: Not Currently  . Drug use: No  . Sexual activity: Yes  Other Topics Concern  . Not on file  Social History Narrative  . Not on file   Social Determinants of Health   Financial Resource Strain: Low Risk   . Difficulty of Paying Living Expenses: Not hard at all  Food Insecurity: No Food Insecurity  . Worried About  Running Out of Food in the Last Year: Never true  . Ran Out of Food in the Last Year: Never true  Transportation Needs: No Transportation Needs  . Lack of Transportation (Medical): No  . Lack of Transportation (Non-Medical): No  Physical Activity: Insufficiently Active  . Days of Exercise per Week: 2 days  . Minutes of Exercise per Session: 10 min  Stress: No Stress Concern Present  . Feeling of Stress : Not at all  Social Connections:   . Frequency of Communication with Friends and Family: Not on file  . Frequency of Social Gatherings with Friends and Family: Not on file  . Attends Religious Services: Not on file  . Active Member of Clubs or  Organizations: Not on file  . Attends Archivist Meetings: Not on file  . Marital Status: Not on file  Intimate Partner Violence: Not At Risk  . Fear of Current or Ex-Partner: No  . Emotionally Abused: No  . Physically Abused: No  . Sexually Abused: No    ALLERGIES: Other  MEDICATIONS:  Current Outpatient Medications  Medication Sig Dispense Refill  . amLODipine (NORVASC) 10 MG tablet Take 10 mg by mouth daily.  3  . aspirin 81 MG tablet Take 1 tablet (81 mg total) by mouth daily. 30 tablet   . brimonidine (ALPHAGAN) 0.2 % ophthalmic solution 1 drop 2 (two) times daily.    Marland Kitchen BYSTOLIC 10 MG tablet Take 10 mg by mouth daily.    . cetirizine (ZYRTEC) 10 MG tablet Take 10 mg by mouth daily as needed for allergies.    Marland Kitchen docusate sodium (COLACE) 100 MG capsule Take 100 mg by mouth 2 (two) times daily.    . dorzolamide-timolol (COSOPT) 22.3-6.8 MG/ML ophthalmic solution Place 1 drop into both eyes 2 (two) times daily.  0  . guaiFENesin (MUCINEX) 600 MG 12 hr tablet Take by mouth 2 (two) times daily.    Marland Kitchen latanoprost (XALATAN) 0.005 % ophthalmic solution Place 1 drop into both eyes at bedtime.    Marland Kitchen losartan-hydrochlorothiazide (HYZAAR) 100-12.5 MG tablet Take 1 tablet by mouth daily.    . pantoprazole (PROTONIX) 40 MG tablet TAKE 1 TABLET BY MOUTH EVERY DAY 90 tablet 1  . sildenafil (REVATIO) 20 MG tablet Take 20 mg by mouth 3 (three) times daily.    . tamsulosin (FLOMAX) 0.4 MG CAPS capsule Take 0.4 mg by mouth.    Marland Kitchen FLUAD QUADRIVALENT 0.5 ML injection      No current facility-administered medications for this encounter.    REVIEW OF SYSTEMS:  On review of systems, the patient reports that he is doing well overall. He denies any chest pain, shortness of breath, cough, fevers, chills, night sweats, unintended weight changes. He denies any bowel disturbances, and denies abdominal pain, nausea or vomiting. He denies any new musculoskeletal or joint aches or pains. His IPSS was 9,  indicating mild-moderate urinary symptoms with urgency, hesitancy and nocturia x2 despite Flomax daily. His SHIM was 14, indicating he has moderate erectile dysfunction. A complete review of systems is obtained and is otherwise negative.    PHYSICAL EXAM:  Wt Readings from Last 3 Encounters:  07/11/19 185 lb (83.9 kg)  05/25/19 195 lb 9.6 oz (88.7 kg)  02/06/19 196 lb 12.8 oz (89.3 kg)   Temp Readings from Last 3 Encounters:  05/25/19 98.3 F (36.8 C) (Temporal)  02/06/19 98.5 F (36.9 C) (Oral)  01/09/19 98.2 F (36.8 C) (Oral)   BP Readings from Last 3  Encounters:  05/25/19 (!) 157/69  02/06/19 140/83  01/09/19 136/74   Pulse Readings from Last 3 Encounters:  05/25/19 60  02/06/19 65  01/09/19 (!) 58   Pain Assessment Pain Score: 0-No pain/10  In general this is a well appearing African American male in no acute distress. He's alert and oriented x4 and appropriate throughout the examination. Cardiopulmonary assessment is negative for acute distress and he exhibits normal effort.    KPS = 90  100 - Normal; no complaints; no evidence of disease. 90   - Able to carry on normal activity; minor signs or symptoms of disease. 80   - Normal activity with effort; some signs or symptoms of disease. 36   - Cares for self; unable to carry on normal activity or to do active work. 60   - Requires occasional assistance, but is able to care for most of his personal needs. 50   - Requires considerable assistance and frequent medical care. 7   - Disabled; requires special care and assistance. 19   - Severely disabled; hospital admission is indicated although death not imminent. 22   - Very sick; hospital admission necessary; active supportive treatment necessary. 10   - Moribund; fatal processes progressing rapidly. 0     - Dead  Karnofsky DA, Abel[place], Craver LS and [facility] [address] [phone]) The use of the nitrogen mustards in the palliative treatment of carcinoma: with particular  reference to bronchogenic carcinoma Cancer 1 [id number]  LABORATORY DATA:  Lab Results  Component Value Date   WBC 6.1 05/25/2019   HGB 12.3 (L) 05/25/2019   HCT 38.8 (L) 05/25/2019   MCV 85.5 05/25/2019   PLT 146 (L) 05/25/2019   Lab Results  Component Value Date   NA 139 05/25/2019   K 4.0 05/25/2019   CL 105 05/25/2019   CO2 27 05/25/2019   Lab Results  Component Value Date   ALT 14 05/25/2019   AST 19 05/25/2019   ALKPHOS 51 05/25/2019   BILITOT 0.8 05/25/2019     RADIOGRAPHY: No results found.    IMPRESSION/PLAN: This visit was conducted via MyChart to spare the patient unnecessary potential exposure in the healthcare setting during the current COVID-19 pandemic. 1. 79 y.o. gentleman with Stage T1c adenocarcinoma of the prostate with Gleason Score of 3+3, and PSA of 12.2. We discussed the patient's workup and outlined the nature of prostate cancer in this setting. The patient's T stage, Gleason's score, and PSA put him into the low risk group. Accordingly, he is eligible for a variety of potential treatment options including brachytherapy, 5.5 weeks of external radiation or active surveillance.  We reviewed that NCCN guidelines recommend active surveillance for very low risk prostate cancers such as his. We also discussed that his elevated PSA is most likely secondary to his BPH and not indicative of higher risk prostate cancer. We discussed the available radiation techniques, and focused on the details and logistics of delivery. The patient is not an ideal candidate for brachytherapy with a prostate volume of 94 cc. Therefore, we discussed and outlined the risks, benefits, short and long-term effects associated with daily external beam radiotherapy and compared and contrasted these with prostatectomy.  He was encouraged to ask questions that were answered to his stated satisfaction.  At the end of the conversation, the patient is interested in moving forward with active surveillance  for management of his low risk prostate cancer.  We advised that we are in support of this and would  recommend periodic MRI scans of the prostate as part of his active surveillance to detect any high risk lesions that might be missed on traditional biopsy.  He understands active surveillance generally consists of close monitoring of the PSA every 3 to 6 months as well as periodic prostate biopsy every 12 to 18 months +/-prostate imaging.  He appears to have a good understanding of his disease and our recommendations for management with active surveillance at this time.  He is comfortable with and in agreement with the stated plan and understands that we are more than happy to continue to participate in his care should there be any indication of disease progression on surveillance follow-up.  We will forward a copy of our note to Dr. Lovena Neighbours and the patient will proceed with follow-up in urology in 6 months as planned.  He knows that he is welcome to call at anytime with any questions or concerns in the interim.   Given current concerns for patient exposure during the COVID-19 pandemic, this encounter was conducted via video-enabled MyChart visit.The patient was notified in advance and has given verbal consent for this type of encounter. The time spent during this encounter was 45 minutes. The attendants for this meeting include Tyler Pita MD, Ashlyn Bruning PA-C, and patient, Lawrence Bailey. During the encounter, Tyler Pita MD, and Freeman Caldron PA-C,were located at [facility] Radiation Oncology Department.  Patient, Lawrence Bailey was located at home.    Nicholos Johns, PA-C    Tyler Pita, MD  [facility] Direct Dial: [phone]  Fax: [phone] [email or web address]  Skype  [email or web address]  This document serves as a record of services personally performed by Tyler Pita, MD and Freeman Caldron, PA-C. It was created on their behalf by Wilburn Mylar, a trained medical scribe. The creation of this record is based on the scribe's personal observations and the provider's statements to them. This document has been checked and approved by the attending provider.

## 2019-07-11 NOTE — Progress Notes (Signed)
GU Location of Tumor / Histology: prostatic adenocarcinoma  If Prostate Cancer, Gleason Score is (3 + 3) and PSA is (12.2) in 04/2019 up from 8.40 in May 2020.   [name] had an initial prostate biopsy in 1993 was revealed one core of atypical cells. Biopsies done 2001, 2007, 2009 we all negative. Prostate biopsy done 2010 revealed atypical cells.   Biopsies of prostate (if applicable) revealed:   Past/Anticipated interventions by urology, if any: prostate biopsies, prescribed tamsulosin, referred to Dr. Tammi Klippel to discuss radiotherapy.   Dr. Lovena Neighbours encouraged active surveillance per his last office note.  Past/Anticipated interventions by medical oncology, if any: no  Weight changes, if any: no  Bowel/Bladder complaints, if any: IPSS 9. SHIM 14. Denies dysuria or hematuria. Denies urinary leakage or incontinence. Reports taking Colace regularly to keep bowels soft.   Nausea/Vomiting, if any: no  Pain issues, if any:  no  SAFETY ISSUES:  Prior radiation? "Radiation to lung in 89 for a blood clot"  Pacemaker/ICD? no  Possible current pregnancy? no, male patient  Is the patient on methotrexate? no  Current Complaints / other details:  79 year old male. Married.

## 2019-07-12 ENCOUNTER — Telehealth: Payer: Self-pay | Admitting: *Deleted

## 2019-07-12 ENCOUNTER — Ambulatory Visit (INDEPENDENT_AMBULATORY_CARE_PROVIDER_SITE_OTHER): Payer: Medicare Other | Admitting: Internal Medicine

## 2019-07-12 ENCOUNTER — Encounter: Payer: Self-pay | Admitting: Internal Medicine

## 2019-07-12 VITALS — BP 114/72 | HR 55 | Temp 98.0°F | Ht 71.0 in | Wt 193.0 lb

## 2019-07-12 DIAGNOSIS — D472 Monoclonal gammopathy: Secondary | ICD-10-CM

## 2019-07-12 DIAGNOSIS — I129 Hypertensive chronic kidney disease with stage 1 through stage 4 chronic kidney disease, or unspecified chronic kidney disease: Secondary | ICD-10-CM

## 2019-07-12 DIAGNOSIS — E663 Overweight: Secondary | ICD-10-CM

## 2019-07-12 DIAGNOSIS — N183 Chronic kidney disease, stage 3 unspecified: Secondary | ICD-10-CM | POA: Diagnosis not present

## 2019-07-12 DIAGNOSIS — Z6826 Body mass index (BMI) 26.0-26.9, adult: Secondary | ICD-10-CM

## 2019-07-12 DIAGNOSIS — C61 Malignant neoplasm of prostate: Secondary | ICD-10-CM

## 2019-07-12 NOTE — Patient Instructions (Signed)
Monoclonal Gammopathy of Undetermined Significance (MGUS) Monoclonal gammopathy of undetermined significance (MGUS) is a condition in which there is too much of a protein called monoclonal protein, or M protein, in the blood. MGUS can cause you to have too many cells in your blood and not enough space for healthy cells. This condition may increase your risk of developing multiple myeloma or other blood disorders in the future. What are the causes? The cause of this condition is not known. What increases the risk? You are more likely to develop this condition if:  You are African American.  You are age 50 or older.  You are male.  You have an autoimmune disease.  You have been exposed to radiation.  You have a family history of MGUS. What are the signs or symptoms? There are no symptoms of this condition. How is this diagnosed?  This condition may be diagnosed with a blood test that checks for M protein. How is this treated? Treatment for this condition may involve:  Having regular exams. This will allow your health care provider to monitor your health.  Having tests done regularly, such as: ? Blood tests to check for M protein in your body. ? Imaging tests, such as a CT scan. ? A bone marrow biopsy. This test involves taking a sample of bone marrow from your body so it can be looked at under a microscope. Follow these instructions at home:  Keep all follow-up visits as told by your health care provider. This is important. Contact a health care provider if:  You have trouble swallowing.  You have pain in your back or ribs.  You have a fever.  You are bruising easily. Get help right away if:  You break a bone.  You have trouble breathing. Summary  Monoclonal gammopathy of undetermined significance (MGUS) is a condition in which there is too much of a protein called monoclonal protein, or M protein, in the blood.  This condition may be diagnosed with a blood test  that checks for M protein.  Treatment for this condition may involve having tests done regularly. Tests may include blood tests, imaging tests, and a bone marrow biopsy. This information is not intended to replace advice given to you by your health care provider. Make sure you discuss any questions you have with your health care provider. Document Revised: 07/04/2018 Document Reviewed: 04/01/2016 Elsevier Patient Education  2020 Elsevier Inc.  

## 2019-07-12 NOTE — Telephone Encounter (Signed)
-----   Message from Owens Shark, NP sent at 07/12/2019  2:05 PM EST ----- Please let him know M-spike is stable, f/u as scheduled.

## 2019-07-12 NOTE — Telephone Encounter (Signed)
Notified via VM of stable M-spike results. Also are posted on Mychart.

## 2019-07-13 NOTE — Progress Notes (Signed)
This visit occurred during the SARS-CoV-2 public health emergency.  Safety protocols were in place, including screening questions prior to the visit, additional usage of staff PPE, and extensive cleaning of exam room while observing appropriate contact time as indicated for disinfecting solutions.  Subjective:     Patient ID: Lawrence Bailey , male    DOB: [birth year] , 79 y.o.   MRN: [id number]   Chief Complaint  Patient presents with  . Hypertension    HPI  He presents today for BP check. He reports compliance with meds. He states he plans to get COVID vaccine.   Hypertension This is a chronic problem. The current episode started more than 1 year ago. The problem has been gradually improving since onset. The problem is controlled. Pertinent negatives include no blurred vision or chest pain. Risk factors for coronary artery disease include male gender. The current treatment provides moderate improvement. Hypertensive end-organ damage includes kidney disease.     Past Medical History:  Diagnosis Date  . BPH (benign prostatic hyperplasia)   . Coronary artery disease   . GIST (gastrointestinal stromal tumor), malignant ([place]) dx'd 04/2007   gleevac comp 04/2008  . Glaucoma   . Hypertension   . Prostate cancer ([place])   . Renal insufficiency      Family History  Problem Relation Age of Onset  . Hypertension Mother   . Cancer Mother   . Breast cancer Neg Hx   . Prostate cancer Neg Hx   . Colon cancer Neg Hx   . Pancreatic cancer Neg Hx      Current Outpatient Medications:  .  amLODipine (NORVASC) 10 MG tablet, Take 10 mg by mouth daily., Disp: , Rfl: 3 .  aspirin 81 MG tablet, Take 1 tablet (81 mg total) by mouth daily., Disp: 30 tablet, Rfl:  .  brimonidine (ALPHAGAN) 0.2 % ophthalmic solution, 1 drop 2 (two) times daily., Disp: , Rfl:  .  BYSTOLIC 10 MG tablet, Take 10 mg by mouth daily., Disp: , Rfl:  .  cetirizine (ZYRTEC) 10 MG tablet, Take 10 mg by mouth daily as needed  for allergies., Disp: , Rfl:  .  docusate sodium (COLACE) 100 MG capsule, Take 100 mg by mouth 2 (two) times daily., Disp: , Rfl:  .  dorzolamide-timolol (COSOPT) 22.3-6.8 MG/ML ophthalmic solution, Place 1 drop into both eyes 2 (two) times daily., Disp: , Rfl: 0 .  FLUAD QUADRIVALENT 0.5 ML injection, , Disp: , Rfl:  .  guaiFENesin (MUCINEX) 600 MG 12 hr tablet, Take by mouth 2 (two) times daily., Disp: , Rfl:  .  latanoprost (XALATAN) 0.005 % ophthalmic solution, Place 1 drop into both eyes at bedtime., Disp: , Rfl:  .  losartan-hydrochlorothiazide (HYZAAR) 100-25 MG tablet, Take 1 tablet by mouth daily., Disp: , Rfl:  .  pantoprazole (PROTONIX) 40 MG tablet, TAKE 1 TABLET BY MOUTH EVERY DAY, Disp: 90 tablet, Rfl: 1 .  sildenafil (REVATIO) 20 MG tablet, Take 20 mg by mouth 3 (three) times daily., Disp: , Rfl:  .  tamsulosin (FLOMAX) 0.4 MG CAPS capsule, Take 0.4 mg by mouth., Disp: , Rfl:  .  losartan-hydrochlorothiazide (HYZAAR) 100-12.5 MG tablet, Take 1 tablet by mouth daily., Disp: , Rfl:    Allergies  Allergen Reactions  . Other     Bee sting      Review of Systems  Constitutional: Negative.   Eyes: Negative for blurred vision.  Respiratory: Negative.   Cardiovascular: Negative.  Negative for chest pain.  Gastrointestinal: Negative.   Neurological: Negative.   Psychiatric/Behavioral: Negative.      Today's Vitals   07/12/19 1413  BP: 114/72  Pulse: (!) 55  Temp: 98 F (36.7 C)  TempSrc: Oral  Weight: 193 lb (87.5 kg)  Height: 5\' 11"  (1.803 m)  PainSc: 0-No pain   Body mass index is 26.92 kg/m.   Objective:  Physical Exam Vitals and nursing note reviewed.  Constitutional:      Appearance: Normal appearance.  Cardiovascular:     Rate and Rhythm: Normal rate and regular rhythm.     Heart sounds: Normal heart sounds.  Pulmonary:     Effort: Pulmonary effort is normal.     Breath sounds: Normal breath sounds.  Skin:    General: Skin is warm.  Neurological:      General: No focal deficit present.     Mental Status: He is alert.  Psychiatric:        Mood and Affect: Mood normal.         Assessment And Plan:     1. Hypertensive nephropathy  Chronic, well controlled. He will continue with current meds. Labs from Dec 2020 were reviewed, renal function is stable.   2. Stage 3 chronic kidney disease, unspecified whether stage 3a or 3b CKD  Chronic, he is encouraged to stay well hydrated. I will recheck at his next visit.   3. Malignant neoplasm of prostate ([facility])  Chronic. He is also followed by Urology. He is not sure which treatment plan he will pursue.  4. Monoclonal gammopathy of unknown significance (MGUS)  Chronic, yet stable.  He is also followed by Hematology.   5. Overweight with body mass index (BMI) of 26 to 26.9 in adult  His weight is stable for his demographic.   [name], MD    THE PATIENT IS ENCOURAGED TO PRACTICE SOCIAL DISTANCING DUE TO THE COVID-19 PANDEMIC.

## 2019-07-14 ENCOUNTER — Encounter: Payer: Self-pay | Admitting: Internal Medicine

## 2019-07-18 ENCOUNTER — Encounter: Payer: Self-pay | Admitting: Medical Oncology

## 2019-08-14 DIAGNOSIS — I10 Essential (primary) hypertension: Secondary | ICD-10-CM | POA: Diagnosis not present

## 2019-08-14 DIAGNOSIS — E785 Hyperlipidemia, unspecified: Secondary | ICD-10-CM | POA: Diagnosis not present

## 2019-08-14 DIAGNOSIS — I639 Cerebral infarction, unspecified: Secondary | ICD-10-CM | POA: Diagnosis not present

## 2019-08-14 DIAGNOSIS — I2510 Atherosclerotic heart disease of native coronary artery without angina pectoris: Secondary | ICD-10-CM | POA: Diagnosis not present

## 2019-08-14 DIAGNOSIS — I82409 Acute embolism and thrombosis of unspecified deep veins of unspecified lower extremity: Secondary | ICD-10-CM | POA: Diagnosis not present

## 2019-08-22 ENCOUNTER — Other Ambulatory Visit: Payer: Self-pay

## 2019-08-22 ENCOUNTER — Ambulatory Visit: Payer: Medicare Other | Admitting: Podiatry

## 2019-08-22 ENCOUNTER — Encounter: Payer: Self-pay | Admitting: Podiatry

## 2019-08-22 VITALS — Temp 97.6°F

## 2019-08-22 DIAGNOSIS — N183 Chronic kidney disease, stage 3 unspecified: Secondary | ICD-10-CM | POA: Diagnosis not present

## 2019-08-22 DIAGNOSIS — B351 Tinea unguium: Secondary | ICD-10-CM | POA: Diagnosis not present

## 2019-08-22 DIAGNOSIS — M79674 Pain in right toe(s): Secondary | ICD-10-CM

## 2019-08-22 DIAGNOSIS — M79675 Pain in left toe(s): Secondary | ICD-10-CM

## 2019-08-22 NOTE — Progress Notes (Signed)
This patient returns to my office for at risk foot care.  This patient requires this care by a professional since this patient will be at risk due to having chronic kidney disease. This patient is unable to cut nails himself since the patient cannot reach his nails.These nails are painful walking and wearing shoes.  This patient presents for at risk foot care today.  General Appearance  Alert, conversant and in no acute stress.  Vascular  Dorsalis pedis and posterior tibial  pulses are palpable  bilaterally.  Capillary return is within normal limits  bilaterally. Temperature is within normal limits  bilaterally.  Neurologic  Senn-Weinstein monofilament wire test within normal limits  bilaterally. Muscle power within normal limits bilaterally.  Nails Thick disfigured discolored nails with subungual debris  from hallux to fifth toes bilaterally. No evidence of bacterial infection or drainage bilaterally.  Orthopedic  No limitations of motion  feet .  No crepitus or effusions noted.  No bony pathology or digital deformities noted. Pes planus  Right greater than left.  HAV right greater than left.  Excessive pronation right foot than left.PTTD right foot  Skin  normotropic skin with no porokeratosis noted bilaterally.  No signs of infections or ulcers noted.     Onychomycosis  Pain in right toes  Pain in left toes  Consent was obtained for treatment procedures.   Mechanical debridement of nails 1-5  bilaterally performed with a nail nipper.  Filed with dremel without incident.    Return office visit   3 months                  Told patient to return for periodic foot care and evaluation due to potential at risk complications.   Gardiner Barefoot DPM

## 2019-09-01 ENCOUNTER — Other Ambulatory Visit: Payer: Self-pay | Admitting: Internal Medicine

## 2019-10-26 ENCOUNTER — Encounter: Payer: Medicare Other | Admitting: Internal Medicine

## 2019-10-26 ENCOUNTER — Ambulatory Visit: Payer: Medicare Other

## 2019-10-30 ENCOUNTER — Ambulatory Visit (INDEPENDENT_AMBULATORY_CARE_PROVIDER_SITE_OTHER): Payer: Medicare Other | Admitting: Otolaryngology

## 2019-10-30 ENCOUNTER — Other Ambulatory Visit: Payer: Self-pay

## 2019-10-30 ENCOUNTER — Encounter (INDEPENDENT_AMBULATORY_CARE_PROVIDER_SITE_OTHER): Payer: Self-pay | Admitting: Otolaryngology

## 2019-10-30 VITALS — Temp 97.3°F

## 2019-10-30 DIAGNOSIS — J310 Chronic rhinitis: Secondary | ICD-10-CM

## 2019-10-30 DIAGNOSIS — H903 Sensorineural hearing loss, bilateral: Secondary | ICD-10-CM

## 2019-10-30 DIAGNOSIS — H6123 Impacted cerumen, bilateral: Secondary | ICD-10-CM

## 2019-10-30 NOTE — Progress Notes (Signed)
HPI: Lawrence Bailey is a 79 y.o. male who returns today for evaluation of cerumen buildup in his ears.  He was last seen about a year ago.  He is also had a little bit of a sore throat and nasal congestion with postnasal drainage.  He is not hearing well especially on the left side but no pain or drainage from the ears..  Past Medical History:  Diagnosis Date  . BPH (benign prostatic hyperplasia)   . Coronary artery disease   . 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 Glaucoma   . Hypertension   . Prostate cancer ([place])   . Renal insufficiency    Past Surgical History:  Procedure Laterality Date  . ABDOMINAL SURGERY    . EYE SURGERY    . HERNIA REPAIR    . KNEE SURGERY     Social History   Socioeconomic History  . Marital status: Married    Spouse name: Not on file  . Number of children: Not on file  . Years of education: Not on file  . Highest education level: Not on file  Occupational History  . Occupation: retired  Tobacco Use  . Smoking status: Never Smoker  . Smokeless tobacco: Never Used  Substance and Sexual Activity  . Alcohol use: Not Currently  . Drug use: No  . Sexual activity: Yes  Other Topics Concern  . Not on file  Social History Narrative  . Not on file   Social Determinants of Health   Financial Resource Strain:   . Difficulty of Paying Living Expenses:   Food Insecurity:   . Worried About Charity fundraiser in the Last Year:   . Arboriculturist in the Last Year:   Transportation Needs:   . Film/video editor (Medical):   Marland Kitchen Lack of Transportation (Non-Medical):   Physical Activity:   . Days of Exercise per Week:   . Minutes of Exercise per Session:   Stress:   . Feeling of Stress :   Social Connections:   . Frequency of Communication with Friends and Family:   . Frequency of Social Gatherings with Friends and Family:   . Attends Religious Services:   . Active Member of Clubs or  Organizations:   . Attends Archivist Meetings:   Marland Kitchen Marital Status:    Family History  Problem Relation Age of Onset  . Hypertension Mother   . Cancer Mother   . Breast cancer Neg Hx   . Prostate cancer Neg Hx   . Colon cancer Neg Hx   . Pancreatic cancer Neg Hx    Allergies  Allergen Reactions  . Other     Bee sting    Prior to Admission medications   Medication Sig Start Date End Date Taking? Authorizing Provider  amLODipine (NORVASC) 10 MG tablet Take 10 mg by mouth daily. 04/13/17  Yes [provider]  aspirin 81 MG tablet Take 1 tablet (81 mg total) by mouth daily. 01/25/12  Yes Saverio Danker, PA-C  brimonidine (ALPHAGAN) 0.2 % ophthalmic solution 1 drop 2 (two) times daily. 04/25/19  Yes [provider]  BYSTOLIC 10 MG tablet Take 10 mg by mouth daily. 05/06/18  Yes [provider]  cetirizine (ZYRTEC) 10 MG tablet Take 10 mg by mouth daily as needed for allergies.   Yes [provider]  docusate sodium (COLACE) 100 MG capsule Take 100 mg by mouth 2 (two)  times daily.   Yes [provider]  dorzolamide-timolol (COSOPT) 22.3-6.8 MG/ML ophthalmic solution Place 1 drop into both eyes 2 (two) times daily. 10/23/16  Yes [provider]  guaiFENesin (MUCINEX) 600 MG 12 hr tablet Take by mouth 2 (two) times daily.   Yes [provider]  latanoprost (XALATAN) 0.005 % ophthalmic solution Place 1 drop into both eyes at bedtime.   Yes [provider]  losartan-hydrochlorothiazide (HYZAAR) 100-25 MG tablet Take 1 tablet by mouth daily.   Yes [provider]  pantoprazole (PROTONIX) 40 MG tablet TAKE 1 TABLET BY MOUTH EVERY DAY 09/01/19  Yes Glendale Chard, MD  sildenafil (REVATIO) 20 MG tablet Take 20 mg by mouth 3 (three) times daily.   Yes [provider]  tamsulosin (FLOMAX) 0.4 MG CAPS capsule Take 0.4 mg by mouth.   Yes [provider]     Positive ROS: Otherwise negative  All  other systems have been reviewed and were otherwise negative with the exception of those mentioned in the HPI and as above.  Physical Exam: Constitutional: Alert, well-appearing, no acute distress Ears: External ears without lesions or tenderness.  He has a large amount of wax in both ear canals especially on the left side that was cleaned with hydroperoxide and suction.  TMs were clear bilaterally.  On tuning fork testing he had a mild to moderate bilateral SNHL with a 1024 tuning fork. Nasal: External nose without lesions. Septum deviated to the left with mild rhinitis and clear mucus discharge.. Clear nasal passages otherwise. Oral: Lips and gums without lesions. Tongue and palate mucosa without lesions. Posterior oropharynx clear.  Oral cavity and tonsil regions appear benign bilaterally. Neck: No palpable adenopathy or masses Respiratory: Breathing comfortably  Skin: No facial/neck lesions or rash noted.  Cerumen impaction removal  Date/Time: 10/30/2019 5:53 PM Performed by: Rozetta Nunnery, MD Authorized by: Rozetta Nunnery, MD   Consent:    Consent obtained:  Verbal   Consent given by:  Patient   Risks discussed:  Pain and bleeding Procedure details:    Location:  L ear and R ear   Procedure type: curette and suction   Post-procedure details:    Inspection:  TM intact and canal normal   Hearing quality:  Improved   Patient tolerance of procedure:  Tolerated well, no immediate complications Comments:     TMs are clear bilaterally.    Assessment: Bilateral cerumen impactions worse on the left side. Mild SNHL Chronic rhinitis  Plan: Prescribed Nasacort 2 sprays each nostril at night to help with the nasal congestion and postnasal drainage. Briefly discussed hearing loss with him.  Discussed with him concerning possibly obtaining a hearing test to evaluate hearing if he is having difficulty socially.   Radene Journey, MD

## 2019-11-01 ENCOUNTER — Ambulatory Visit (INDEPENDENT_AMBULATORY_CARE_PROVIDER_SITE_OTHER): Payer: Medicare Other

## 2019-11-01 ENCOUNTER — Other Ambulatory Visit: Payer: Self-pay

## 2019-11-01 ENCOUNTER — Encounter: Payer: Self-pay | Admitting: Internal Medicine

## 2019-11-01 ENCOUNTER — Ambulatory Visit (INDEPENDENT_AMBULATORY_CARE_PROVIDER_SITE_OTHER): Payer: Medicare Other | Admitting: Internal Medicine

## 2019-11-01 VITALS — BP 140/78 | HR 66 | Temp 97.5°F | Ht 69.4 in | Wt 189.0 lb

## 2019-11-01 DIAGNOSIS — I129 Hypertensive chronic kidney disease with stage 1 through stage 4 chronic kidney disease, or unspecified chronic kidney disease: Secondary | ICD-10-CM

## 2019-11-01 DIAGNOSIS — Z0000 Encounter for general adult medical examination without abnormal findings: Secondary | ICD-10-CM | POA: Diagnosis not present

## 2019-11-01 DIAGNOSIS — N183 Chronic kidney disease, stage 3 unspecified: Secondary | ICD-10-CM | POA: Diagnosis not present

## 2019-11-01 DIAGNOSIS — C49A2 Gastrointestinal stromal tumor of stomach: Secondary | ICD-10-CM | POA: Insufficient documentation

## 2019-11-01 LAB — POCT URINALYSIS DIPSTICK
Bilirubin, UA: NEGATIVE
Blood, UA: NEGATIVE
Glucose, UA: NEGATIVE
Ketones, UA: NEGATIVE
Leukocytes, UA: NEGATIVE
Nitrite, UA: NEGATIVE
Protein, UA: NEGATIVE
Spec Grav, UA: 1.02 (ref 1.010–1.025)
Urobilinogen, UA: 0.2 E.U./dL
pH, UA: 7 (ref 5.0–8.0)

## 2019-11-01 LAB — POCT UA - MICROALBUMIN
Albumin/Creatinine Ratio, Urine, POC: 30
Creatinine, POC: 300 mg/dL
Microalbumin Ur, POC: 30 mg/L

## 2019-11-01 NOTE — Patient Instructions (Signed)

## 2019-11-01 NOTE — Patient Instructions (Signed)
Lawrence Bailey , Thank you for taking time to come for your Medicare Wellness Visit. I appreciate your ongoing commitment to your health goals. Please review the following plan we discussed and let me know if I can assist you in the future.   Screening recommendations/referrals: Colonoscopy: not required Recommended yearly ophthalmology/optometry visit for glaucoma screening and checkup Recommended yearly dental visit for hygiene and checkup  Vaccinations: Influenza vaccine: 02/2019 Pneumococcal vaccine: checking Tdap vaccine: 02/2019 Shingles vaccine: discussed    Advanced directives: Advance directive discussed with you today. I have provided a copy for you to complete at home and have notarized. Once this is complete please bring a copy in to our office so we can scan it into your chart.  Conditions/risks identified: overweight  Next appointment: 05/02/2020 at 10:00  Preventive Care 25 Years and Older, Male Preventive care refers to lifestyle choices and visits with your health care provider that can promote health and wellness. What does preventive care include?  A yearly physical exam. This is also called an annual well check.  Dental exams once or twice a year.  Routine eye exams. Ask your health care provider how often you should have your eyes checked.  Personal lifestyle choices, including:  Daily care of your teeth and gums.  Regular physical activity.  Eating a healthy diet.  Avoiding tobacco and drug use.  Limiting alcohol use.  Practicing safe sex.  Taking low doses of aspirin every day.  Taking vitamin and mineral supplements as recommended by your health care provider. What happens during an annual well check? The services and screenings done by your health care provider during your annual well check will depend on your age, overall health, lifestyle risk factors, and family history of disease. Counseling  Your health care provider may ask you questions  about your:  Alcohol use.  Tobacco use.  Drug use.  Emotional well-being.  Home and relationship well-being.  Sexual activity.  Eating habits.  History of falls.  Memory and ability to understand (cognition).  Work and work Statistician. Screening  You may have the following tests or measurements:  Height, weight, and BMI.  Blood pressure.  Lipid and cholesterol levels. These may be checked every 5 years, or more frequently if you are over 58 years old.  Skin check.  Lung cancer screening. You may have this screening every year starting at age 74 if you have a 30-pack-year history of smoking and currently smoke or have quit within the past 15 years.  Fecal occult blood test (FOBT) of the stool. You may have this test every year starting at age 64.  Flexible sigmoidoscopy or colonoscopy. You may have a sigmoidoscopy every 5 years or a colonoscopy every 10 years starting at age 60.  Prostate cancer screening. Recommendations will vary depending on your family history and other risks.  Hepatitis C blood test.  Hepatitis B blood test.  Sexually transmitted disease (STD) testing.  Diabetes screening. This is done by checking your blood sugar (glucose) after you have not eaten for a while (fasting). You may have this done every 1-3 years.  Abdominal aortic aneurysm (AAA) screening. You may need this if you are a current or former smoker.  Osteoporosis. You may be screened starting at age 97 if you are at high risk. Talk with your health care provider about your test results, treatment options, and if necessary, the need for more tests. Vaccines  Your health care provider may recommend certain vaccines, such as:  Influenza  vaccine. This is recommended every year.  Tetanus, diphtheria, and acellular pertussis (Tdap, Td) vaccine. You may need a Td booster every 10 years.  Zoster vaccine. You may need this after age 57.  Pneumococcal 13-valent conjugate (PCV13)  vaccine. One dose is recommended after age 29.  Pneumococcal polysaccharide (PPSV23) vaccine. One dose is recommended after age 40. Talk to your health care provider about which screenings and vaccines you need and how often you need them. This information is not intended to replace advice given to you by your health care provider. Make sure you discuss any questions you have with your health care provider. Document Released: 06/07/2015 Document Revised: 01/29/2016 Document Reviewed: 03/12/2015 Elsevier Interactive Patient Education  2017 [place] Prevention in the Home Falls can cause injuries. They can happen to people of all ages. There are many things you can do to make your home safe and to help prevent falls. What can I do on the outside of my home?  Regularly fix the edges of walkways and driveways and fix any cracks.  Remove anything that might make you trip as you walk through a door, such as a raised step or threshold.  Trim any bushes or trees on the path to your home.  Use bright outdoor lighting.  Clear any walking paths of anything that might make someone trip, such as rocks or tools.  Regularly check to see if handrails are loose or broken. Make sure that both sides of any steps have handrails.  Any raised decks and porches should have guardrails on the edges.  Have any leaves, snow, or ice cleared regularly.  Use sand or salt on walking paths during winter.  Clean up any spills in your garage right away. This includes oil or grease spills. What can I do in the bathroom?  Use night lights.  Install grab bars by the toilet and in the tub and shower. Do not use towel bars as grab bars.  Use non-skid mats or decals in the tub or shower.  If you need to sit down in the shower, use a plastic, non-slip stool.  Keep the floor dry. Clean up any water that spills on the floor as soon as it happens.  Remove soap buildup in the tub or shower  regularly.  Attach bath mats securely with double-sided non-slip rug tape.  Do not have throw rugs and other things on the floor that can make you trip. What can I do in the bedroom?  Use night lights.  Make sure that you have a light by your bed that is easy to reach.  Do not use any sheets or blankets that are too big for your bed. They should not hang down onto the floor.  Have a firm chair that has side arms. You can use this for support while you get dressed.  Do not have throw rugs and other things on the floor that can make you trip. What can I do in the kitchen?  Clean up any spills right away.  Avoid walking on wet floors.  Keep items that you use a lot in easy-to-reach places.  If you need to reach something above you, use a strong step stool that has a grab bar.  Keep electrical cords out of the way.  Do not use floor polish or wax that makes floors slippery. If you must use wax, use non-skid floor wax.  Do not have throw rugs and other things on the floor that can  make you trip. What can I do with my stairs?  Do not leave any items on the stairs.  Make sure that there are handrails on both sides of the stairs and use them. Fix handrails that are broken or loose. Make sure that handrails are as long as the stairways.  Check any carpeting to make sure that it is firmly attached to the stairs. Fix any carpet that is loose or worn.  Avoid having throw rugs at the top or bottom of the stairs. If you do have throw rugs, attach them to the floor with carpet tape.  Make sure that you have a light switch at the top of the stairs and the bottom of the stairs. If you do not have them, ask someone to add them for you. What else can I do to help prevent falls?  Wear shoes that:  Do not have high heels.  Have rubber bottoms.  Are comfortable and fit you well.  Are closed at the toe. Do not wear sandals.  If you use a stepladder:  Make sure that it is fully  opened. Do not climb a closed stepladder.  Make sure that both sides of the stepladder are locked into place.  Ask someone to hold it for you, if possible.  Clearly mark and make sure that you can see:  Any grab bars or handrails.  First and last steps.  Where the edge of each step is.  Use tools that help you move around (mobility aids) if they are needed. These include:  Canes.  Walkers.  Scooters.  Crutches.  Turn on the lights when you go into a dark area. Replace any light bulbs as soon as they burn out.  Set up your furniture so you have a clear path. Avoid moving your furniture around.  If any of your floors are uneven, fix them.  If there are any pets around you, be aware of where they are.  Review your medicines with your doctor. Some medicines can make you feel dizzy. This can increase your chance of falling. Ask your doctor what other things that you can do to help prevent falls. This information is not intended to replace advice given to you by your health care provider. Make sure you discuss any questions you have with your health care provider. Document Released: 03/07/2009 Document Revised: 10/17/2015 Document Reviewed: 06/15/2014 Elsevier Interactive Patient Education  2017 [employer].

## 2019-11-01 NOTE — Progress Notes (Signed)
This visit occurred during the SARS-CoV-2 public health emergency.  Safety protocols were in place, including screening questions prior to the visit, additional usage of staff PPE, and extensive cleaning of exam room while observing appropriate contact time as indicated for disinfecting solutions.  Subjective:   Lawrence Bailey is a 79 y.o. male who presents for Medicare Annual/Subsequent preventive examination.  Review of Systems:  n/a Cardiac Risk Factors include: advanced age (>49men, >16 women);hypertension;male gender     Objective:    Vitals: BP 140/78 (BP Location: Right Arm, Patient Position: Sitting)   Pulse 66   Temp (!) 97.5 F (36.4 C) (Oral)   Ht 5' 9.4" (1.763 m)   Wt 189 lb (85.7 kg)   BMI 27.59 kg/m   Body mass index is 27.59 kg/m.  Advanced Directives 11/01/2019 07/11/2019 05/25/2019 10/25/2018 11/08/2016 11/07/2016 04/10/2016  Does Patient Have a Medical Advance Directive? No No No No No No No  Would patient like information on creating a medical advance directive? Yes (MAU/Ambulatory/Procedural Areas - Information given) Yes (MAU/Ambulatory/Procedural Areas - Information given) No - Patient declined - No - Patient declined - No - patient declined information  Pre-existing out of facility DNR order (yellow form or pink MOST form) - - - - - - -    Tobacco Social History   Tobacco Use  Smoking Status Never Smoker  Smokeless Tobacco Never Used     Counseling given: Not Answered   Clinical Intake:  Pre-visit preparation completed: Yes  Pain : 0-10 Pain Score: 2  Pain Type: Chronic pain Pain Location: Knee Pain Orientation: Right, Left Pain Descriptors / Indicators: Aching Pain Onset: More than a month ago Pain Frequency: Intermittent     Nutritional Status: BMI 25 -29 Overweight Nutritional Risks: None Diabetes: No  How often do you need to have someone help you when you read instructions, pamphlets, or other written materials from your doctor or  pharmacy?: 1 - Never What is the last grade level you completed in school?: 10th grade  Interpreter Needed?: No  Information entered by :: NAllen LPN  Past Medical History:  Diagnosis Date  . BPH (benign prostatic hyperplasia)   . Coronary artery disease   . 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 Glaucoma   . Hypertension   . Prostate cancer ([place])   . Renal insufficiency    Past Surgical History:  Procedure Laterality Date  . ABDOMINAL SURGERY    . EYE SURGERY    . HERNIA REPAIR    . KNEE SURGERY     Family History  Problem Relation Age of Onset  . Hypertension Mother   . Cancer Mother   . Breast cancer Neg Hx   . Prostate cancer Neg Hx   . Colon cancer Neg Hx   . Pancreatic cancer Neg Hx    Social History   Socioeconomic History  . Marital status: Married    Spouse name: Not on file  . Number of children: Not on file  . Years of education: Not on file  . Highest education level: Not on file  Occupational History  . Occupation: retired  Tobacco Use  . Smoking status: Never Smoker  . Smokeless tobacco: Never Used  Substance and Sexual Activity  . Alcohol use: Not Currently  . Drug use: No  . Sexual activity: Yes  Other Topics Concern  . Not on file  Social History Narrative  . Not on file  Social Determinants of Health   Financial Resource Strain: Low Risk   . Difficulty of Paying Living Expenses: Not hard at all  Food Insecurity: No Food Insecurity  . Worried About Charity fundraiser in the Last Year: Never true  . Ran Out of Food in the Last Year: Never true  Transportation Needs: No Transportation Needs  . Lack of Transportation (Medical): No  . Lack of Transportation (Non-Medical): No  Physical Activity: Insufficiently Active  . Days of Exercise per Week: 2 days  . Minutes of Exercise per Session: 60 min  Stress: No Stress Concern Present  . Feeling of Stress : Not at all  Social  Connections:   . Frequency of Communication with Friends and Family:   . Frequency of Social Gatherings with Friends and Family:   . Attends Religious Services:   . Active Member of Clubs or Organizations:   . Attends Archivist Meetings:   Marland Kitchen Marital Status:     Outpatient Encounter Medications as of 11/01/2019  Medication Sig  . amLODipine (NORVASC) 10 MG tablet Take 10 mg by mouth daily.  Marland Kitchen aspirin 81 MG tablet Take 1 tablet (81 mg total) by mouth daily.  . brimonidine (ALPHAGAN) 0.2 % ophthalmic solution 1 drop 2 (two) times daily.  Marland Kitchen BYSTOLIC 10 MG tablet Take 10 mg by mouth daily.  . cetirizine (ZYRTEC) 10 MG tablet Take 10 mg by mouth daily as needed for allergies.  Marland Kitchen docusate sodium (COLACE) 100 MG capsule Take 100 mg by mouth 2 (two) times daily.  . dorzolamide-timolol (COSOPT) 22.3-6.8 MG/ML ophthalmic solution Place 1 drop into both eyes 2 (two) times daily.  . fluticasone (FLONASE) 50 MCG/ACT nasal spray Place 1 spray into both nostrils 2 (two) times daily.  Marland Kitchen guaiFENesin (MUCINEX) 600 MG 12 hr tablet Take by mouth 2 (two) times daily.  Marland Kitchen latanoprost (XALATAN) 0.005 % ophthalmic solution Place 1 drop into both eyes at bedtime.  Marland Kitchen losartan-hydrochlorothiazide (HYZAAR) 100-25 MG tablet Take 1 tablet by mouth daily.  . pantoprazole (PROTONIX) 40 MG tablet TAKE 1 TABLET BY MOUTH EVERY DAY  . sildenafil (REVATIO) 20 MG tablet Take 20 mg by mouth 3 (three) times daily.  . tamsulosin (FLOMAX) 0.4 MG CAPS capsule Take 0.4 mg by mouth.   No facility-administered encounter medications on file as of 11/01/2019.    Activities of Daily Living In your present state of health, do you have any difficulty performing the following activities: 11/01/2019 11/01/2019  Hearing? N N  Vision? N N  Difficulty concentrating or making decisions? N Y  Walking or climbing stairs? N N  Dressing or bathing? N N  Doing errands, shopping? N N  Preparing Food and eating ? N -  Using the Toilet? N -    In the past six months, have you accidently leaked urine? Y -  Do you have problems with loss of bowel control? N -  Managing your Medications? N -  Managing your Finances? N -  Housekeeping or managing your Housekeeping? N -  Some recent data might be hidden    Patient Care Team: Glendale Chard, MD as PCP - General (Internal Medicine) Johnathan Hausen, MD as Consulting Physician (General Surgery) Carol Ada, MD as Consulting Physician (Gastroenterology) Charolette Forward, MD as Consulting Physician (Cardiology) Warden Fillers, MD as Consulting Physician (Ophthalmology)   Assessment:   This is a routine wellness examination for Duston.  Exercise Activities and Dietary recommendations Current Exercise Habits: Home exercise routine, Type of  exercise: walking, Time (Minutes): 60, Frequency (Times/Week): 2, Weekly Exercise (Minutes/Week): 120  Goals    . Patient Stated     Just to live as long as he can    . Patient Stated     11/01/2019, just staying alive       Fall Risk Fall Risk  11/01/2019 11/01/2019 10/25/2018 06/15/2018 03/21/2018  Falls in the past year? 0 0 0 0 No  Risk for fall due to : Medication side effect - Medication side effect - -  Follow up Falls evaluation completed;Education provided;Falls prevention discussed - Falls prevention discussed - -   Is the patient's home free of loose throw rugs in walkways, pet beds, electrical cords, etc?   yes      Grab bars in the bathroom? yes      Handrails on the stairs?   yes      Adequate lighting?   yes  Timed Get Up and Go Performed: n/a  Depression Screen PHQ 2/9 Scores 11/01/2019 11/01/2019 10/25/2018 06/15/2018  PHQ - 2 Score 0 0 0 0  PHQ- 9 Score 0 - 0 -    Cognitive Function     6CIT Screen 11/01/2019 10/25/2018  What Year? 0 points 0 points  What month? 0 points 0 points  What time? 0 points 0 points  Count back from 20 2 points 0 points  Months in reverse 0 points 2 points  Repeat phrase 0 points 0 points  Total  Score 2 2    Immunization History  Administered Date(s) Administered  . Fluad Quad(high Dose 65+) 03/15/2019  . PFIZER SARS-COV-2 Vaccination 07/01/2019, 07/22/2019  . Tdap 03/15/2019    Qualifies for Shingles Vaccine? yes  Screening Tests Health Maintenance  Topic Date Due  . PNA vac Low Risk Adult (1 of 2 - PCV13) Never done  . INFLUENZA VACCINE  12/24/2019  . TETANUS/TDAP  03/14/2029  . COVID-19 Vaccine  Completed  . Hepatitis C Screening  Completed   Cancer Screenings: Lung: Low Dose CT Chest recommended if Age 55-80 years, 30 pack-year currently smoking OR have quit w/in 15years. Patient does not qualify. Colorectal: not required  Additional Screenings:  Hepatitis C Screening:today      Plan:    Patient has no goals set except to keep living.  I have personally reviewed and noted the following in the patient's chart:   . Medical and social history . Use of alcohol, tobacco or illicit drugs  . Current medications and supplements . Functional ability and status . Nutritional status . Physical activity . Advanced directives . List of other physicians . Hospitalizations, surgeries, and ER visits in previous 12 months . Vitals . Screenings to include cognitive, depression, and falls . Referrals and appointments  In addition, I have reviewed and discussed with patient certain preventive protocols, quality metrics, and best practice recommendations. A written personalized care plan for preventive services as well as general preventive health recommendations were provided to patient.     Kellie Simmering, LPN  10/24/353

## 2019-11-02 LAB — CBC
Hematocrit: 36.9 % — ABNORMAL LOW (ref 37.5–51.0)
Hemoglobin: 12.2 g/dL — ABNORMAL LOW (ref 13.0–17.7)
MCH: 27.7 pg (ref 26.6–33.0)
MCHC: 33.1 g/dL (ref 31.5–35.7)
MCV: 84 fL (ref 79–97)
Platelets: 167 10*3/uL (ref 150–450)
RBC: 4.41 x10E6/uL (ref 4.14–5.80)
RDW: 13.3 % (ref 11.6–15.4)
WBC: 4.9 10*3/uL (ref 3.4–10.8)

## 2019-11-02 LAB — CMP14+EGFR
ALT: 13 IU/L (ref 0–44)
AST: 18 IU/L (ref 0–40)
Albumin/Globulin Ratio: 1 — ABNORMAL LOW (ref 1.2–2.2)
Albumin: 4 g/dL (ref 3.7–4.7)
Alkaline Phosphatase: 54 IU/L (ref 48–121)
BUN/Creatinine Ratio: 16 (ref 10–24)
BUN: 23 mg/dL (ref 8–27)
Bilirubin Total: 0.9 mg/dL (ref 0.0–1.2)
CO2: 27 mmol/L (ref 20–29)
Calcium: 9.9 mg/dL (ref 8.6–10.2)
Chloride: 103 mmol/L (ref 96–106)
Creatinine, Ser: 1.4 mg/dL — ABNORMAL HIGH (ref 0.76–1.27)
GFR calc Af Amer: 55 mL/min/{1.73_m2} — ABNORMAL LOW (ref >59)
GFR calc non Af Amer: 47 mL/min/{1.73_m2} — ABNORMAL LOW (ref >59)
Globulin, Total: 4.2 g/dL (ref 1.5–4.5)
Glucose: 89 mg/dL (ref 65–99)
Potassium: 4.5 mmol/L (ref 3.5–5.2)
Sodium: 140 mmol/L (ref 134–144)
Total Protein: 8.2 g/dL (ref 6.0–8.5)

## 2019-11-02 LAB — LIPID PANEL
Chol/HDL Ratio: 2.7 ratio (ref 0.0–5.0)
Cholesterol, Total: 139 mg/dL (ref 100–199)
HDL: 51 mg/dL (ref >39)
LDL Chol Calc (NIH): 75 mg/dL (ref 0–99)
Triglycerides: 66 mg/dL (ref 0–149)
VLDL Cholesterol Cal: 13 mg/dL (ref 5–40)

## 2019-11-02 LAB — HEPATITIS C ANTIBODY: Hep C Virus Ab: <0.1 s/co ratio (ref 0.0–0.9)

## 2019-11-06 ENCOUNTER — Other Ambulatory Visit: Payer: Self-pay | Admitting: Internal Medicine

## 2019-11-06 DIAGNOSIS — N183 Chronic kidney disease, stage 3 unspecified: Secondary | ICD-10-CM

## 2019-11-06 DIAGNOSIS — I129 Hypertensive chronic kidney disease with stage 1 through stage 4 chronic kidney disease, or unspecified chronic kidney disease: Secondary | ICD-10-CM

## 2019-11-06 NOTE — Progress Notes (Signed)
This visit occurred during the SARS-CoV-2 public health emergency.  Safety protocols were in place, including screening questions prior to the visit, additional usage of staff PPE, and extensive cleaning of exam room while observing appropriate contact time as indicated for disinfecting solutions.  Subjective:     Patient ID: Lawrence Bailey , male    DOB: July 28, 1940 , 79 y.o.   MRN: [id number]   Chief Complaint  Patient presents with  . Annual Exam  . Hypertension    HPI  He is here today for a full physical examination.  He has no specific concerns or complaints at this time.   Hypertension This is a chronic problem. The current episode started more than 1 year ago. The problem has been gradually improving since onset. The problem is controlled. Pertinent negatives include no blurred vision or chest pain. Risk factors for coronary artery disease include male gender and sedentary lifestyle. The current treatment provides moderate improvement. Compliance problems include exercise.  Hypertensive end-organ damage includes kidney disease.     Past Medical History:  Diagnosis Date  . BPH (benign prostatic hyperplasia)   . Coronary artery disease   . GIST (gastrointestinal stromal tumor), malignant ([facility]) dx'd 04/2007   gleevac comp 04/2008  . Glaucoma   . Hypertension   . Prostate cancer ([facility])   . Renal insufficiency      Family History  Problem Relation Age of Onset  . Hypertension Mother   . Cancer Mother   . Breast cancer Neg Hx   . Prostate cancer Neg Hx   . Colon cancer Neg Hx   . Pancreatic cancer Neg Hx      Current Outpatient Medications:  .  amLODipine (NORVASC) 10 MG tablet, Take 10 mg by mouth daily., Disp: , Rfl: 3 .  aspirin 81 MG tablet, Take 1 tablet (81 mg total) by mouth daily., Disp: 30 tablet, Rfl:  .  brimonidine (ALPHAGAN) 0.2 % ophthalmic solution, 1 drop 2 (two) times daily., Disp: , Rfl:  .  BYSTOLIC 10 MG tablet, Take 10 mg by mouth daily., Disp: ,  Rfl:  .  cetirizine (ZYRTEC) 10 MG tablet, Take 10 mg by mouth daily as needed for allergies., Disp: , Rfl:  .  docusate sodium (COLACE) 100 MG capsule, Take 100 mg by mouth 2 (two) times daily., Disp: , Rfl:  .  dorzolamide-timolol (COSOPT) 22.3-6.8 MG/ML ophthalmic solution, Place 1 drop into both eyes 2 (two) times daily., Disp: , Rfl: 0 .  guaiFENesin (MUCINEX) 600 MG 12 hr tablet, Take by mouth 2 (two) times daily., Disp: , Rfl:  .  latanoprost (XALATAN) 0.005 % ophthalmic solution, Place 1 drop into both eyes at bedtime., Disp: , Rfl:  .  losartan-hydrochlorothiazide (HYZAAR) 100-25 MG tablet, Take 1 tablet by mouth daily., Disp: , Rfl:  .  pantoprazole (PROTONIX) 40 MG tablet, TAKE 1 TABLET BY MOUTH EVERY DAY, Disp: 90 tablet, Rfl: 1 .  sildenafil (REVATIO) 20 MG tablet, Take 20 mg by mouth 3 (three) times daily., Disp: , Rfl:  .  tamsulosin (FLOMAX) 0.4 MG CAPS capsule, Take 0.4 mg by mouth., Disp: , Rfl:  .  fluticasone (FLONASE) 50 MCG/ACT nasal spray, Place 1 spray into both nostrils 2 (two) times daily., Disp: , Rfl:    Allergies  Allergen Reactions  . Other     Bee sting     Men's preventive visit. Patient Health Questionnaire (PHQ-2) is    Office Visit from 11/01/2019 in [facility]  PHQ-2 Total Score 0    Patient is on a healthy diet. Marital status: Married. Relevant history for alcohol use is:  Social History   Substance and Sexual Activity  Alcohol Use Not Currently  . Relevant history for tobacco use is:  Social History   Tobacco Use  Smoking Status Never Smoker  Smokeless Tobacco Never Used  .  Review of Systems  Constitutional: Negative.   HENT: Negative.   Eyes: Negative.  Negative for blurred vision.  Respiratory: Negative.   Cardiovascular: Negative.  Negative for chest pain.  Endocrine: Negative.   Genitourinary: Negative.   Musculoskeletal: Negative.   Skin: Negative.   Allergic/Immunologic: Negative.   Neurological:  Negative.   Hematological: Negative.   Psychiatric/Behavioral: Negative.      Today's Vitals   11/01/19 1123  BP: 140/78  Pulse: 66  Temp: (!) 97.5 F (36.4 C)  TempSrc: Oral  Weight: 189 lb (85.7 kg)  Height: 5' 9.4" (1.763 m)  PainSc: 2   PainLoc: Knee   Body mass index is 27.59 kg/m.   Objective:  Physical Exam Vitals and nursing note reviewed.  Constitutional:      Appearance: Normal appearance.  HENT:     Head: Normocephalic and atraumatic.     Right Ear: Tympanic membrane, ear canal and external ear normal.     Left Ear: Tympanic membrane, ear canal and external ear normal.     Nose:     Comments: Deferred, masked    Mouth/Throat:     Comments: Deferred, masked Eyes:     Extraocular Movements: Extraocular movements intact.     Conjunctiva/sclera: Conjunctivae normal.     Pupils: Pupils are equal, round, and reactive to light.  Cardiovascular:     Rate and Rhythm: Normal rate and regular rhythm.     Pulses: Normal pulses.     Heart sounds: Normal heart sounds.  Pulmonary:     Effort: Pulmonary effort is normal.     Breath sounds: Normal breath sounds.  Chest:     Breasts:        Right: Normal. No swelling, bleeding, inverted nipple, mass or nipple discharge.        Left: Normal. No swelling, bleeding, inverted nipple, mass or nipple discharge.  Abdominal:     General: Abdomen is flat. Bowel sounds are normal.     Palpations: Abdomen is soft.     Comments: Healed surgical scars.   Genitourinary:    Comments: Deferred.  Musculoskeletal:        General: Normal range of motion.     Cervical back: Normal range of motion and neck supple.  Skin:    General: Skin is warm.  Neurological:     General: No focal deficit present.     Mental Status: He is alert.  Psychiatric:        Mood and Affect: Mood normal.        Behavior: Behavior normal.         Assessment And Plan:     1. Routine general medical examination at health care facility  A full exam  was performed.  DRE deferred.  PATIENT IS ADVISED TO GET 30-45 MINUTES REGULAR EXERCISE NO LESS THAN FOUR TO FIVE DAYS PER WEEK - BOTH WEIGHTBEARING EXERCISES AND AEROBIC ARE RECOMMENDED.  HE IS ADVISED TO FOLLOW A HEALTHY DIET WITH AT LEAST SIX FRUITS/VEGGIES PER DAY, DECREASE INTAKE OF RED MEAT, AND TO INCREASE FISH INTAKE TO TWO DAYS PER WEEK.  MEATS/FISH SHOULD NOT  BE FRIED, BAKED OR BROILED IS PREFERABLE.  I SUGGEST WEARING SPF 50 SUNSCREEN ON EXPOSED PARTS AND ESPECIALLY WHEN IN THE DIRECT SUNLIGHT FOR AN EXTENDED PERIOD OF TIME.  PLEASE AVOID FAST FOOD RESTAURANTS AND INCREASE YOUR WATER INTAKE.   2. Hypertensive nephropathy  Chronic, fair control. He will continue with current meds. He is encouraged to avoid adding salt to his foods. EKG performed, sinus bradycardia w/ HR 51, voltage criteria for LVH. He is advised that optimal BP control is less than 130/80.  He will rto in six months for re-evaluation.   - POCT Urinalysis Dipstick (81002) - POCT UA - Microalbumin - EKG 12-Lead - Hepatitis C antibody - CBC no Diff - Lipid panel - CMP14+EGFR  3. Stage 3 chronic kidney disease, unspecified whether stage 3a or 3b CKD  Chronic. I will recheck renal function today. Will consider Renal evaluation if persistent. He is agreeable to referral if needed.     [name], MD    THE PATIENT IS ENCOURAGED TO PRACTICE SOCIAL DISTANCING DUE TO THE COVID-19 PANDEMIC.

## 2019-11-06 NOTE — Progress Notes (Signed)
Ref n

## 2019-11-13 ENCOUNTER — Encounter: Payer: Self-pay | Admitting: Internal Medicine

## 2019-11-13 DIAGNOSIS — I639 Cerebral infarction, unspecified: Secondary | ICD-10-CM | POA: Diagnosis not present

## 2019-11-13 DIAGNOSIS — I2510 Atherosclerotic heart disease of native coronary artery without angina pectoris: Secondary | ICD-10-CM | POA: Diagnosis not present

## 2019-11-13 DIAGNOSIS — E785 Hyperlipidemia, unspecified: Secondary | ICD-10-CM | POA: Diagnosis not present

## 2019-11-13 DIAGNOSIS — I10 Essential (primary) hypertension: Secondary | ICD-10-CM | POA: Diagnosis not present

## 2019-11-22 ENCOUNTER — Ambulatory Visit: Payer: Medicare Other | Admitting: Podiatry

## 2019-11-22 ENCOUNTER — Other Ambulatory Visit: Payer: Self-pay

## 2019-11-22 ENCOUNTER — Encounter: Payer: Self-pay | Admitting: Podiatry

## 2019-11-22 DIAGNOSIS — M79674 Pain in right toe(s): Secondary | ICD-10-CM | POA: Diagnosis not present

## 2019-11-22 DIAGNOSIS — M79675 Pain in left toe(s): Secondary | ICD-10-CM | POA: Diagnosis not present

## 2019-11-22 DIAGNOSIS — N183 Chronic kidney disease, stage 3 unspecified: Secondary | ICD-10-CM

## 2019-11-22 DIAGNOSIS — B351 Tinea unguium: Secondary | ICD-10-CM | POA: Diagnosis not present

## 2019-11-22 NOTE — Progress Notes (Signed)
This patient returns to my office for at risk foot care.  This patient requires this care by a professional since this patient will be at risk due to having chronic kidney disease.  This patient is unable to cut nails himself since the patient cannot reach his nails.These nails are painful walking and wearing shoes.  This patient presents for at risk foot care today.  General Appearance  Alert, conversant and in no acute stress.  Vascular  Dorsalis pedis and posterior tibial  pulses are palpable  bilaterally.  Capillary return is within normal limits  bilaterally. Temperature is within normal limits  bilaterally.  Neurologic  Senn-Weinstein monofilament wire test within normal limits  bilaterally. Muscle power within normal limits bilaterally.  Nails Thick disfigured discolored nails with subungual debris  from hallux to fifth toes bilaterally. No evidence of bacterial infection or drainage bilaterally.  Orthopedic  No limitations of motion  feet .  No crepitus or effusions noted.  No bony pathology or digital deformities noted. Pes planus  Right greater than left.  HAV right greater than left.  Excessive pronation right foot than left  .PTTD right foot  Skin  normotropic skin with no porokeratosis noted bilaterally.  No signs of infections or ulcers noted.  Pinch callus right foot.  Onychomycosis  Pain in right toes  Pain in left toes  Consent was obtained for treatment procedures.   Mechanical debridement of nails 1-5  bilaterally performed with a nail nipper.  Filed with dremel without incident.    Return office visit   3 months                  Told patient to return for periodic foot care and evaluation due to potential at risk complications.   Madelyne Millikan DPM  

## 2019-11-23 ENCOUNTER — Telehealth: Payer: Self-pay | Admitting: Nurse Practitioner

## 2019-11-23 NOTE — Telephone Encounter (Signed)
Rescheduled 8/31 appt per provider request. Called and spoke with pt, confirmed appts

## 2019-12-27 LAB — PSA: PSA: 8.26

## 2020-01-01 ENCOUNTER — Encounter: Payer: Self-pay | Admitting: Internal Medicine

## 2020-01-16 DIAGNOSIS — H2511 Age-related nuclear cataract, right eye: Secondary | ICD-10-CM | POA: Diagnosis not present

## 2020-01-16 DIAGNOSIS — H04123 Dry eye syndrome of bilateral lacrimal glands: Secondary | ICD-10-CM | POA: Diagnosis not present

## 2020-01-16 DIAGNOSIS — H401122 Primary open-angle glaucoma, left eye, moderate stage: Secondary | ICD-10-CM | POA: Diagnosis not present

## 2020-01-16 DIAGNOSIS — Z961 Presence of intraocular lens: Secondary | ICD-10-CM | POA: Diagnosis not present

## 2020-01-16 DIAGNOSIS — H401113 Primary open-angle glaucoma, right eye, severe stage: Secondary | ICD-10-CM | POA: Diagnosis not present

## 2020-01-19 DIAGNOSIS — K219 Gastro-esophageal reflux disease without esophagitis: Secondary | ICD-10-CM | POA: Diagnosis not present

## 2020-01-19 DIAGNOSIS — I129 Hypertensive chronic kidney disease with stage 1 through stage 4 chronic kidney disease, or unspecified chronic kidney disease: Secondary | ICD-10-CM | POA: Diagnosis not present

## 2020-01-19 DIAGNOSIS — N1831 Chronic kidney disease, stage 3a: Secondary | ICD-10-CM | POA: Diagnosis not present

## 2020-01-19 DIAGNOSIS — C49A0 Gastrointestinal stromal tumor, unspecified site: Secondary | ICD-10-CM | POA: Diagnosis not present

## 2020-01-23 ENCOUNTER — Ambulatory Visit: Payer: Medicare Other | Admitting: Nurse Practitioner

## 2020-01-23 ENCOUNTER — Other Ambulatory Visit: Payer: Medicare Other

## 2020-01-25 ENCOUNTER — Ambulatory Visit: Payer: Medicare Other | Admitting: Nurse Practitioner

## 2020-01-25 ENCOUNTER — Other Ambulatory Visit: Payer: Medicare Other

## 2020-02-01 ENCOUNTER — Encounter: Payer: Self-pay | Admitting: Nurse Practitioner

## 2020-02-01 ENCOUNTER — Inpatient Hospital Stay: Payer: Medicare Other | Admitting: Nurse Practitioner

## 2020-02-01 ENCOUNTER — Other Ambulatory Visit: Payer: Self-pay

## 2020-02-01 ENCOUNTER — Inpatient Hospital Stay: Payer: Medicare Other | Attending: Nurse Practitioner

## 2020-02-01 VITALS — BP 146/86 | HR 50 | Temp 96.8°F | Resp 16 | Ht 69.4 in | Wt 188.4 lb

## 2020-02-01 DIAGNOSIS — C61 Malignant neoplasm of prostate: Secondary | ICD-10-CM | POA: Insufficient documentation

## 2020-02-01 DIAGNOSIS — D472 Monoclonal gammopathy: Secondary | ICD-10-CM | POA: Diagnosis not present

## 2020-02-01 DIAGNOSIS — Z86718 Personal history of other venous thrombosis and embolism: Secondary | ICD-10-CM | POA: Insufficient documentation

## 2020-02-01 DIAGNOSIS — I10 Essential (primary) hypertension: Secondary | ICD-10-CM | POA: Diagnosis not present

## 2020-02-01 DIAGNOSIS — C49A0 Gastrointestinal stromal tumor, unspecified site: Secondary | ICD-10-CM | POA: Diagnosis not present

## 2020-02-01 DIAGNOSIS — Z8509 Personal history of malignant neoplasm of other digestive organs: Secondary | ICD-10-CM | POA: Diagnosis not present

## 2020-02-01 LAB — CMP (CANCER CENTER ONLY)
ALT: 9 U/L (ref 0–44)
AST: 17 U/L (ref 15–41)
Albumin: 3.8 g/dL (ref 3.5–5.0)
Alkaline Phosphatase: 49 U/L (ref 38–126)
Anion gap: 5 (ref 5–15)
BUN: 19 mg/dL (ref 8–23)
CO2: 29 mmol/L (ref 22–32)
Calcium: 9.5 mg/dL (ref 8.9–10.3)
Chloride: 105 mmol/L (ref 98–111)
Creatinine: 1.29 mg/dL — ABNORMAL HIGH (ref 0.61–1.24)
GFR, Est AFR Am: >60 mL/min (ref >60)
GFR, Estimated: 52 mL/min — ABNORMAL LOW (ref >60)
Glucose, Bld: 83 mg/dL (ref 70–99)
Potassium: 3.4 mmol/L — ABNORMAL LOW (ref 3.5–5.1)
Sodium: 139 mmol/L (ref 135–145)
Total Bilirubin: 0.8 mg/dL (ref 0.3–1.2)
Total Protein: 8.4 g/dL — ABNORMAL HIGH (ref 6.5–8.1)

## 2020-02-01 LAB — CBC WITH DIFFERENTIAL (CANCER CENTER ONLY)
Abs Immature Granulocytes: 0.01 10*3/uL (ref 0.00–0.07)
Basophils Absolute: 0 10*3/uL (ref 0.0–0.1)
Basophils Relative: 1 %
Eosinophils Absolute: 0.1 10*3/uL (ref 0.0–0.5)
Eosinophils Relative: 2 %
HCT: 37.6 % — ABNORMAL LOW (ref 39.0–52.0)
Hemoglobin: 11.9 g/dL — ABNORMAL LOW (ref 13.0–17.0)
Immature Granulocytes: 0 %
Lymphocytes Relative: 26 %
Lymphs Abs: 1.2 10*3/uL (ref 0.7–4.0)
MCH: 27.3 pg (ref 26.0–34.0)
MCHC: 31.6 g/dL (ref 30.0–36.0)
MCV: 86.2 fL (ref 80.0–100.0)
Monocytes Absolute: 0.4 10*3/uL (ref 0.1–1.0)
Monocytes Relative: 8 %
Neutro Abs: 3 10*3/uL (ref 1.7–7.7)
Neutrophils Relative %: 63 %
Platelet Count: 140 10*3/uL — ABNORMAL LOW (ref 150–400)
RBC: 4.36 MIL/uL (ref 4.22–5.81)
RDW: 14 % (ref 11.5–15.5)
WBC Count: 4.8 10*3/uL (ref 4.0–10.5)
nRBC: 0 % (ref 0.0–0.2)

## 2020-02-01 NOTE — Progress Notes (Signed)
  [facility] OFFICE PROGRESS NOTE   Diagnosis: History of gastrointestinal stromal tumor, serum monoclonal protein  INTERVAL HISTORY:   Lawrence Bailey returns as scheduled.  He feels well.  No interim illnesses or infections.  He denies nausea/vomiting.  No change in bowel habits.  He takes a stool softener if needed.  He has occasional abdominal pain.  He reports a good appetite.  Objective:  Vital signs in last 24 hours:  Blood pressure (!) 146/86, pulse (!) 50, temperature (!) 96.8 F (36 C), temperature source Tympanic, resp. rate 16, height 5' 9.4" (1.763 m), weight 188 lb 6.4 oz (85.5 kg), SpO2 100 %.    HEENT: Neck without mass. Lymphatics: No palpable cervical, supraclavicular, axillary or inguinal lymph nodes. Resp: Lungs clear bilaterally. Cardio: Regular rate and rhythm. GI: Abdomen soft and nontender.  No hepatomegaly.  No mass. Vascular: No leg edema.    Lab Results:  Lab Results  Component Value Date   WBC 4.8 02/01/2020   HGB 11.9 (L) 02/01/2020   HCT 37.6 (L) 02/01/2020   MCV 86.2 02/01/2020   PLT 140 (L) 02/01/2020   NEUTROABS 3.0 02/01/2020    Imaging:  No results found.  Medications: I have reviewed the patient's current medications.  Assessment/Plan: 1. 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 Pathology showed a 6 cm GI stromal tumor. No vascular or lymphatic invasion. All surgical margins negative. Lymph nodes not sampled. Tumor confined to the submucosa. Mitotic activity 25-50 mitoses per high-power field. He completed 1 year of Gleevec 400 mg daily. Restaging CT abdomen/pelvis 04/10/2014 with no evidence of recurrent/metastatic disease.CT abdomen/pelvis 05/12/2018 without evidence of local recurrence or metastatic disease. 2. History of right lower extremity DVT June 2018. 3. Evaluation in the emergency department 03/08/2018 for right leg redness, swelling and tenderness.  Venous Doppler negative for DVT. Incidental finding of a possible 3.3 cm right inguinal lymph node.CT abdomen/pelvis 05/12/2018 showed stable small inguinal lymph nodes bilaterally. 4. Chronic renal failure 5. Monoclonal gammopathy of unknown significance  01/09/2019 SPEP with M spike 1.5   07/05/2019 SPEP with M spike 1.9 6. Hypertension 7. Prostate cancer-on surveillance, followed by urology  Disposition: Lawrence Bailey appears stable.  He remains in clinical remission from the gastrointestinal stromal tumor.  We reviewed the CBC from today.  He remains stable from a hematologic standpoint.  We will follow-up on the outstanding labs from today including chemistry panel, SPEP, serum light chains and IgG.  He continues follow-up with urology for the prostate cancer.  He will return for lab and follow-up in 6 months, sooner pending outstanding labs from today.  Plan reviewed with Dr. Benay Spice.      Ned Card ANP/GNP-BC   02/01/2020  10:09 AM

## 2020-02-02 LAB — PROTEIN ELECTROPHORESIS, SERUM
A/G Ratio: 1 (ref 0.7–1.7)
Albumin ELP: 3.9 g/dL (ref 2.9–4.4)
Alpha-1-Globulin: 0.2 g/dL (ref 0.0–0.4)
Alpha-2-Globulin: 0.5 g/dL (ref 0.4–1.0)
Beta Globulin: 0.8 g/dL (ref 0.7–1.3)
Gamma Globulin: 2.7 g/dL — ABNORMAL HIGH (ref 0.4–1.8)
Globulin, Total: 4.1 g/dL — ABNORMAL HIGH (ref 2.2–3.9)
M-Spike, %: 2.1 g/dL — ABNORMAL HIGH
Total Protein ELP: 8 g/dL (ref 6.0–8.5)

## 2020-02-02 LAB — IGG: IgG (Immunoglobin G), Serum: 2765 mg/dL — ABNORMAL HIGH (ref 603–1613)

## 2020-02-02 LAB — KAPPA/LAMBDA LIGHT CHAINS
Kappa free light chain: 38.3 mg/L — ABNORMAL HIGH (ref 3.3–19.4)
Kappa, lambda light chain ratio: 1.77 — ABNORMAL HIGH (ref 0.26–1.65)
Lambda free light chains: 21.6 mg/L (ref 5.7–26.3)

## 2020-02-07 ENCOUNTER — Telehealth: Payer: Self-pay

## 2020-02-07 NOTE — Telephone Encounter (Signed)
Pt made aware of lab results no new orders encouraged to call for questions changes or concerns nothing further call ended

## 2020-02-07 NOTE — Telephone Encounter (Signed)
-----   Message from Owens Shark, NP sent at 02/07/2020  4:28 PM EDT ----- Please let him know the outstanding labs from last week are stable.  Follow-up as scheduled.

## 2020-02-22 ENCOUNTER — Other Ambulatory Visit: Payer: Self-pay | Admitting: Internal Medicine

## 2020-02-27 ENCOUNTER — Encounter: Payer: Self-pay | Admitting: Podiatry

## 2020-02-27 ENCOUNTER — Ambulatory Visit: Payer: Medicare Other | Admitting: Podiatry

## 2020-02-27 ENCOUNTER — Other Ambulatory Visit: Payer: Self-pay

## 2020-02-27 DIAGNOSIS — M79674 Pain in right toe(s): Secondary | ICD-10-CM

## 2020-02-27 DIAGNOSIS — B351 Tinea unguium: Secondary | ICD-10-CM

## 2020-02-27 DIAGNOSIS — M79675 Pain in left toe(s): Secondary | ICD-10-CM

## 2020-02-27 DIAGNOSIS — N183 Chronic kidney disease, stage 3 unspecified: Secondary | ICD-10-CM

## 2020-02-27 NOTE — Progress Notes (Signed)
This patient returns to my office for at risk foot care.  This patient requires this care by a professional since this patient will be at risk due to having chronic kidney disease.  This patient is unable to cut nails himself since the patient cannot reach his nails.These nails are painful walking and wearing shoes.  This patient presents for at risk foot care today.  General Appearance  Alert, conversant and in no acute stress.  Vascular  Dorsalis pedis and posterior tibial  pulses are palpable  bilaterally.  Capillary return is within normal limits  bilaterally. Temperature is within normal limits  bilaterally.  Neurologic  Senn-Weinstein monofilament wire test within normal limits  bilaterally. Muscle power within normal limits bilaterally.  Nails Thick disfigured discolored nails with subungual debris  from hallux to fifth toes bilaterally. No evidence of bacterial infection or drainage bilaterally.  Orthopedic  No limitations of motion  feet .  No crepitus or effusions noted.  No bony pathology or digital deformities noted. Pes planus  Right greater than left.  HAV right greater than left.  Excessive pronation right foot than left  .PTTD right foot  Skin  normotropic skin with no porokeratosis noted bilaterally.  No signs of infections or ulcers noted.  Pinch callus right foot.  Onychomycosis  Pain in right toes  Pain in left toes  Consent was obtained for treatment procedures.   Mechanical debridement of nails 1-5  bilaterally performed with a nail nipper.  Filed with dremel without incident.    Return office visit   3 months                  Told patient to return for periodic foot care and evaluation due to potential at risk complications.   Gardiner Barefoot DPM

## 2020-04-15 DIAGNOSIS — H401113 Primary open-angle glaucoma, right eye, severe stage: Secondary | ICD-10-CM | POA: Diagnosis not present

## 2020-04-15 DIAGNOSIS — H04123 Dry eye syndrome of bilateral lacrimal glands: Secondary | ICD-10-CM | POA: Diagnosis not present

## 2020-04-15 DIAGNOSIS — H10413 Chronic giant papillary conjunctivitis, bilateral: Secondary | ICD-10-CM | POA: Diagnosis not present

## 2020-04-15 DIAGNOSIS — H401122 Primary open-angle glaucoma, left eye, moderate stage: Secondary | ICD-10-CM | POA: Diagnosis not present

## 2020-04-15 DIAGNOSIS — H2511 Age-related nuclear cataract, right eye: Secondary | ICD-10-CM | POA: Diagnosis not present

## 2020-05-02 ENCOUNTER — Encounter: Payer: Self-pay | Admitting: Internal Medicine

## 2020-05-02 ENCOUNTER — Other Ambulatory Visit: Payer: Self-pay

## 2020-05-02 ENCOUNTER — Ambulatory Visit (INDEPENDENT_AMBULATORY_CARE_PROVIDER_SITE_OTHER): Payer: Medicare Other | Admitting: Internal Medicine

## 2020-05-02 VITALS — BP 140/70 | HR 61 | Temp 98.1°F | Ht 69.4 in | Wt 189.0 lb

## 2020-05-02 DIAGNOSIS — E663 Overweight: Secondary | ICD-10-CM

## 2020-05-02 DIAGNOSIS — I700 Atherosclerosis of aorta: Secondary | ICD-10-CM

## 2020-05-02 DIAGNOSIS — I129 Hypertensive chronic kidney disease with stage 1 through stage 4 chronic kidney disease, or unspecified chronic kidney disease: Secondary | ICD-10-CM

## 2020-05-02 DIAGNOSIS — H6123 Impacted cerumen, bilateral: Secondary | ICD-10-CM

## 2020-05-02 DIAGNOSIS — N1831 Chronic kidney disease, stage 3a: Secondary | ICD-10-CM | POA: Diagnosis not present

## 2020-05-02 DIAGNOSIS — I1310 Hypertensive heart and chronic kidney disease without heart failure, with stage 1 through stage 4 chronic kidney disease, or unspecified chronic kidney disease: Secondary | ICD-10-CM | POA: Insufficient documentation

## 2020-05-02 LAB — LIPID PANEL
Chol/HDL Ratio: 2.7 ratio (ref 0.0–5.0)
Cholesterol, Total: 154 mg/dL (ref 100–199)
HDL: 57 mg/dL (ref >39)
LDL Chol Calc (NIH): 84 mg/dL (ref 0–99)
Triglycerides: 65 mg/dL (ref 0–149)
VLDL Cholesterol Cal: 13 mg/dL (ref 5–40)

## 2020-05-02 NOTE — Patient Instructions (Addendum)
BP uncontrolled for last 3 visits. We will make following changes:  Take losartan/hctz in AM with breakfast  Take Bystolic in PM with dinner  Please RTO on 12/21 in am for nurse visit to recheck your blood pressure  Hypertension, Adult Hypertension is another name for high blood pressure. High blood pressure forces your heart to work harder to pump blood. This can cause problems over time. There are two numbers in a blood pressure reading. There is a top number (systolic) over a bottom number (diastolic). It is best to have a blood pressure that is below 120/80. Healthy choices can help lower your blood pressure, or you may need medicine to help lower it. What are the causes? The cause of this condition is not known. Some conditions may be related to high blood pressure. What increases the risk?  Smoking.  Having type 2 diabetes mellitus, high cholesterol, or both.  Not getting enough exercise or physical activity.  Being overweight.  Having too much fat, sugar, calories, or salt (sodium) in your diet.  Drinking too much alcohol.  Having long-term (chronic) kidney disease.  Having a family history of high blood pressure.  Age. Risk increases with age.  Race. You may be at higher risk if you are African American.  Gender. Men are at higher risk than women before age 1. After age 77, women are at higher risk than men.  Having obstructive sleep apnea.  Stress. What are the signs or symptoms?  High blood pressure may not cause symptoms. Very high blood pressure (hypertensive crisis) may cause: ? Headache. ? Feelings of worry or nervousness (anxiety). ? Shortness of breath. ? Nosebleed. ? A feeling of being sick to your stomach (nausea). ? Throwing up (vomiting). ? Changes in how you see. ? Very bad chest pain. ? Seizures. How is this treated?  This condition is treated by making healthy lifestyle changes, such as: ? Eating healthy foods. ? Exercising  more. ? Drinking less alcohol.  Your health care provider may prescribe medicine if lifestyle changes are not enough to get your blood pressure under control, and if: ? Your top number is above 130. ? Your bottom number is above 80.  Your personal target blood pressure may vary. Follow these instructions at home: Eating and drinking   If told, follow the DASH eating plan. To follow this plan: ? Fill one half of your plate at each meal with fruits and vegetables. ? Fill one fourth of your plate at each meal with whole grains. Whole grains include whole-wheat pasta, brown rice, and whole-grain bread. ? Eat or drink low-fat dairy products, such as skim milk or low-fat yogurt. ? Fill one fourth of your plate at each meal with low-fat (lean) proteins. Low-fat proteins include fish, chicken without skin, eggs, beans, and tofu. ? Avoid fatty meat, cured and processed meat, or chicken with skin. ? Avoid pre-made or processed food.  Eat less than 1,500 mg of salt each day.  Do not drink alcohol if: ? Your doctor tells you not to drink. ? You are pregnant, may be pregnant, or are planning to become pregnant.  If you drink alcohol: ? Limit how much you use to:  0-1 drink a day for women.  0-2 drinks a day for men. ? Be aware of how much alcohol is in your drink. In the [place] one drink equals one 12 oz bottle of beer (355 mL), one 5 oz glass of wine (148 mL), or one 1 oz glass  of hard liquor (44 mL). Lifestyle   Work with your doctor to stay at a healthy weight or to lose weight. Ask your doctor what the best weight is for you.  Get at least 30 minutes of exercise most days of the week. This may include walking, swimming, or biking.  Get at least 30 minutes of exercise that strengthens your muscles (resistance exercise) at least 3 days a week. This may include lifting weights or doing Pilates.  Do not use any products that contain nicotine or tobacco, such as cigarettes, e-cigarettes,  and chewing tobacco. If you need help quitting, ask your doctor.  Check your blood pressure at home as told by your doctor.  Keep all follow-up visits as told by your doctor. This is important. Medicines  Take over-the-counter and prescription medicines only as told by your doctor. Follow directions carefully.  Do not skip doses of blood pressure medicine. The medicine does not work as well if you skip doses. Skipping doses also puts you at risk for problems.  Ask your doctor about side effects or reactions to medicines that you should watch for. Contact a doctor if you:  Think you are having a reaction to the medicine you are taking.  Have headaches that keep coming back (recurring).  Feel dizzy.  Have swelling in your ankles.  Have trouble with your vision. Get help right away if you:  Get a very bad headache.  Start to feel mixed up (confused).  Feel weak or numb.  Feel faint.  Have very bad pain in your: ? Chest. ? Belly (abdomen).  Throw up more than once.  Have trouble breathing. Summary  Hypertension is another name for high blood pressure.  High blood pressure forces your heart to work harder to pump blood.  For most people, a normal blood pressure is less than 120/80.  Making healthy choices can help lower blood pressure. If your blood pressure does not get lower with healthy choices, you may need to take medicine. This information is not intended to replace advice given to you by your health care provider. Make sure you discuss any questions you have with your health care provider. Document Revised: 01/19/2018 Document Reviewed: 01/19/2018 Elsevier Patient Education  2020 [employer].

## 2020-05-02 NOTE — Progress Notes (Addendum)
Rutherford Nail as a scribe for [name], MD.,have documented all relevant documentation on the behalf of [name], MD,as directed by  [name], MD while in the presence of [name], MD. This visit occurred during the SARS-CoV-2 public health emergency.  Safety protocols were in place, including screening questions prior to the visit, additional usage of staff PPE, and extensive cleaning of exam room while observing appropriate contact time as indicated for disinfecting solutions.  Subjective:     Patient ID: Lawrence Bailey , male    DOB: 1940-06-28 , 79 y.o.   MRN: [id number]   Chief Complaint  Patient presents with  . Hypertension    HPI  He presents today for BP check. He reports compliance with meds. He denies headaches, chest pain and palpitations.   Hypertension This is a chronic problem. The current episode started more than 1 year ago. The problem has been gradually improving since onset. The problem is controlled. Pertinent negatives include no blurred vision, chest pain or headaches. Risk factors for coronary artery disease include male gender. The current treatment provides moderate improvement. Hypertensive end-organ damage includes kidney disease.     Past Medical History:  Diagnosis Date  . BPH (benign prostatic hyperplasia)   . Coronary artery disease   . 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 Glaucoma   . Hypertension   . Prostate cancer ([place])   . Renal insufficiency      Family History  Problem Relation Age of Onset  . Hypertension Mother   . Cancer Mother   . Breast cancer Neg Hx   . Prostate cancer Neg Hx   . Colon cancer Neg Hx   . Pancreatic cancer Neg Hx      Current Outpatient Medications:  .  amLODipine (NORVASC) 10 MG tablet, Take 10 mg by mouth daily., Disp: , Rfl: 3 .  aspirin 81 MG tablet, Take 1 tablet (81 mg total) by mouth daily., Disp: 30 tablet,  Rfl:  .  brimonidine (ALPHAGAN) 0.2 % ophthalmic solution, 1 drop 2 (two) times daily., Disp: , Rfl:  .  BYSTOLIC 10 MG tablet, Take 10 mg by mouth daily., Disp: , Rfl:  .  cetirizine (ZYRTEC) 10 MG tablet, Take 10 mg by mouth daily as needed for allergies., Disp: , Rfl:  .  docusate sodium (COLACE) 100 MG capsule, Take 100 mg by mouth 2 (two) times daily., Disp: , Rfl:  .  dorzolamide-timolol (COSOPT) 22.3-6.8 MG/ML ophthalmic solution, Place 1 drop into both eyes 2 (two) times daily., Disp: , Rfl: 0 .  fluticasone (FLONASE) 50 MCG/ACT nasal spray, Place 1 spray into both nostrils 2 (two) times daily., Disp: , Rfl:  .  guaiFENesin (MUCINEX) 600 MG 12 hr tablet, Take by mouth 2 (two) times daily., Disp: , Rfl:  .  latanoprost (XALATAN) 0.005 % ophthalmic solution, Place 1 drop into both eyes at bedtime., Disp: , Rfl:  .  losartan-hydrochlorothiazide (HYZAAR) 100-25 MG tablet, Take 1 tablet by mouth daily., Disp: , Rfl:  .  pantoprazole (PROTONIX) 40 MG tablet, TAKE 1 TABLET BY MOUTH EVERY DAY, Disp: 90 tablet, Rfl: 1 .  sildenafil (REVATIO) 20 MG tablet, Take 20 mg by mouth 3 (three) times daily., Disp: , Rfl:  .  tamsulosin (FLOMAX) 0.4 MG CAPS capsule, Take 0.4 mg by mouth., Disp: , Rfl:    Allergies  Allergen Reactions  . Other  Bee sting      Review of Systems  Constitutional: Negative.  Negative for fatigue.  HENT: Negative.   Eyes: Negative for blurred vision.  Cardiovascular: Negative for chest pain.  Endocrine: Negative for polydipsia, polyphagia and polyuria.  Musculoskeletal: Negative.   Skin: Negative.   Neurological: Negative for dizziness and headaches.  Psychiatric/Behavioral: Negative.      Today's Vitals   05/02/20 1003  BP: 140/70  Pulse: 61  Temp: 98.1 F (36.7 C)  TempSrc: Oral  Weight: 189 lb (85.7 kg)  Height: 5' 9.4" (1.763 m)  PainSc: 0-No pain   Body mass index is 27.59 kg/m.  Wt Readings from Last 3 Encounters:  05/02/20 189 lb (85.7 kg)   02/01/20 188 lb 6.4 oz (85.5 kg)  11/01/19 189 lb (85.7 kg)   BP Readings from Last 3 Encounters:  05/02/20 140/70  02/01/20 (!) 146/86  11/01/19 140/78    Objective:  Physical Exam Vitals and nursing note reviewed.  Constitutional:      Appearance: Normal appearance.  HENT:     Head: Normocephalic and atraumatic.     Right Ear: Ear canal and external ear normal. There is impacted cerumen.     Left Ear: Ear canal and external ear normal. There is impacted cerumen.  Cardiovascular:     Rate and Rhythm: Normal rate and regular rhythm.     Heart sounds: Normal heart sounds.  Pulmonary:     Effort: Pulmonary effort is normal.     Breath sounds: Normal breath sounds.  Skin:    General: Skin is warm.  Neurological:     General: No focal deficit present.     Mental Status: He is alert.  Psychiatric:        Mood and Affect: Mood normal.         Assessment And Plan:     1. Hypertensive nephropathy Comments: Chronic, fair control. Pt advised last 3 BP readings have been elevated. He does not wish to take additional medication. He will continue with current meds for now.  Advised to take losartan/hct in am. He will take Bystolic and amlodipine in the evenings.  Encouraged to follow low sodium diet. He will f/u in 2 weeks for nurse visit and see me in six weeks. All questions were answered to his satisfaction.   2. Stage 3a chronic kidney disease ([place]) Comments: Chronic, encouraged to stay well hydrated. Importance of optimal BP control to prevent progression of CKD was discussed with the patient.   3. Bilateral impacted cerumen AFTER OBTAINING VERBAL CONSENT, BOTH EARS WERE FLUSHED BY IRRIGATION. SHE TOLERATED PROCEDURE WELL WITHOUT ANY COMPLICATIONS. NO TM ABNORMALITIES WERE NOTED. - Ear Lavage  4. Atherosclerosis of aorta ([facility]) Comments: Chronic. Seen on 2019 CT scan. Encouraged to follow heart healthy diet. Advised to comply with statin therapy.  - Lipid panel  5.  Overweight (BMI 25.0-29.9) Comments: BMI 27. HIs weight is acceptable for his demographic. Encouraged to aim for at least  150 min of exercise per week.     Patient was given opportunity to ask questions. Patient verbalized understanding of the plan and was able to repeat key elements of the plan. All questions were answered to their satisfaction.  [name], MD   I, [name], MD, have reviewed all documentation for this visit. The documentation on 05/04/20 for the exam, diagnosis, procedures, and orders are all accurate and complete.  THE PATIENT IS ENCOURAGED TO PRACTICE SOCIAL DISTANCING DUE TO THE COVID-19 PANDEMIC.

## 2020-05-14 ENCOUNTER — Other Ambulatory Visit: Payer: Self-pay

## 2020-05-14 ENCOUNTER — Encounter: Payer: Self-pay | Admitting: Internal Medicine

## 2020-05-14 ENCOUNTER — Ambulatory Visit: Payer: Medicare Other

## 2020-05-14 VITALS — BP 160/78 | HR 54 | Temp 98.1°F | Ht 68.2 in | Wt 191.0 lb

## 2020-05-14 DIAGNOSIS — I129 Hypertensive chronic kidney disease with stage 1 through stage 4 chronic kidney disease, or unspecified chronic kidney disease: Secondary | ICD-10-CM

## 2020-05-14 MED ORDER — VALSARTAN-HYDROCHLOROTHIAZIDE 160-25 MG PO TABS: 1.0000 | ORAL_TABLET | Freq: Every day | ORAL | 1 refills | Status: DC

## 2020-05-14 NOTE — Progress Notes (Signed)
Patient is here for bpc. He was advised to stop losartan-hctz 100-25 and start valsartan-hctz 160-25 and return in two weeks for nurse visit. Medication sent to pharmacy   BP Readings from Last 3 Encounters:  05/14/20 (!) 160/78  05/02/20 140/70  02/01/20 (!) 146/86

## 2020-05-15 DIAGNOSIS — H04123 Dry eye syndrome of bilateral lacrimal glands: Secondary | ICD-10-CM | POA: Diagnosis not present

## 2020-05-15 DIAGNOSIS — H401122 Primary open-angle glaucoma, left eye, moderate stage: Secondary | ICD-10-CM | POA: Diagnosis not present

## 2020-05-15 DIAGNOSIS — Z961 Presence of intraocular lens: Secondary | ICD-10-CM | POA: Diagnosis not present

## 2020-05-15 DIAGNOSIS — H2511 Age-related nuclear cataract, right eye: Secondary | ICD-10-CM | POA: Diagnosis not present

## 2020-05-15 DIAGNOSIS — H401113 Primary open-angle glaucoma, right eye, severe stage: Secondary | ICD-10-CM | POA: Diagnosis not present

## 2020-05-21 ENCOUNTER — Ambulatory Visit: Payer: Medicare Other

## 2020-05-28 ENCOUNTER — Ambulatory Visit: Payer: Medicare Other

## 2020-05-28 ENCOUNTER — Other Ambulatory Visit: Payer: Self-pay

## 2020-05-28 VITALS — BP 126/70 | HR 61 | Temp 98.4°F | Ht 67.4 in | Wt 193.8 lb

## 2020-05-28 DIAGNOSIS — I129 Hypertensive chronic kidney disease with stage 1 through stage 4 chronic kidney disease, or unspecified chronic kidney disease: Secondary | ICD-10-CM

## 2020-05-28 DIAGNOSIS — Z79899 Other long term (current) drug therapy: Secondary | ICD-10-CM | POA: Diagnosis not present

## 2020-05-28 LAB — BASIC METABOLIC PANEL
BUN/Creatinine Ratio: 14 (ref 10–24)
BUN: 26 mg/dL (ref 8–27)
CO2: 25 mmol/L (ref 20–29)
Calcium: 9.5 mg/dL (ref 8.6–10.2)
Chloride: 101 mmol/L (ref 96–106)
Creatinine, Ser: 1.86 mg/dL — ABNORMAL HIGH (ref 0.76–1.27)
GFR calc Af Amer: 39 mL/min/{1.73_m2} — ABNORMAL LOW (ref >59)
GFR calc non Af Amer: 33 mL/min/{1.73_m2} — ABNORMAL LOW (ref >59)
Glucose: 99 mg/dL (ref 65–99)
Potassium: 3.9 mmol/L (ref 3.5–5.2)
Sodium: 140 mmol/L (ref 134–144)

## 2020-05-28 NOTE — Progress Notes (Signed)
PATIENT IS HERE FOR BP CHECK AFTER STARTING VALSARTAN-HCTZ  BP Readings from Last 3 Encounters:  05/28/20 126/70  05/14/20 (!) 160/78  05/02/20 140/70   PATIENT WILL GET BMP TODAY AND SEE THE PROVIDER IN 6 WEEKS

## 2020-05-29 ENCOUNTER — Ambulatory Visit: Payer: Medicare Other | Admitting: Podiatry

## 2020-05-29 ENCOUNTER — Encounter: Payer: Self-pay | Admitting: Podiatry

## 2020-05-29 DIAGNOSIS — M79675 Pain in left toe(s): Secondary | ICD-10-CM

## 2020-05-29 DIAGNOSIS — B351 Tinea unguium: Secondary | ICD-10-CM

## 2020-05-29 DIAGNOSIS — N183 Chronic kidney disease, stage 3 unspecified: Secondary | ICD-10-CM | POA: Diagnosis not present

## 2020-05-29 DIAGNOSIS — M79674 Pain in right toe(s): Secondary | ICD-10-CM | POA: Diagnosis not present

## 2020-05-29 NOTE — Progress Notes (Signed)
This patient returns to my office for at risk foot care.  This patient requires this care by a professional since this patient will be at risk due to having chronic kidney disease.  This patient is unable to cut nails himself since the patient cannot reach his nails.These nails are painful walking and wearing shoes.  This patient presents for at risk foot care today.  General Appearance  Alert, conversant and in no acute stress.  Vascular  Dorsalis pedis and posterior tibial  pulses are palpable  bilaterally.  Capillary return is within normal limits  bilaterally. Temperature is within normal limits  bilaterally.  Neurologic  Senn-Weinstein monofilament wire test within normal limits  bilaterally. Muscle power within normal limits bilaterally.  Nails Thick disfigured discolored nails with subungual debris  from hallux to fifth toes bilaterally. No evidence of bacterial infection or drainage bilaterally.  Orthopedic  No limitations of motion  feet .  No crepitus or effusions noted.  No bony pathology or digital deformities noted. Pes planus  Right greater than left.  HAV right greater than left.  Excessive pronation right foot than left  .PTTD right foot  Skin  normotropic skin with no porokeratosis noted bilaterally.  No signs of infections or ulcers noted.  Pinch callus right foot.  Onychomycosis  Pain in right toes  Pain in left toes  Consent was obtained for treatment procedures.   Mechanical debridement of nails 1-5  bilaterally performed with a nail nipper.  Filed with dremel without incident.    Return office visit   10 weeks                  Told patient to return for periodic foot care and evaluation due to potential at risk complications.   Helane Gunther DPM

## 2020-06-07 DIAGNOSIS — E785 Hyperlipidemia, unspecified: Secondary | ICD-10-CM | POA: Diagnosis not present

## 2020-06-07 DIAGNOSIS — I10 Essential (primary) hypertension: Secondary | ICD-10-CM | POA: Diagnosis not present

## 2020-06-07 DIAGNOSIS — I639 Cerebral infarction, unspecified: Secondary | ICD-10-CM | POA: Diagnosis not present

## 2020-06-07 DIAGNOSIS — I2510 Atherosclerotic heart disease of native coronary artery without angina pectoris: Secondary | ICD-10-CM | POA: Diagnosis not present

## 2020-06-18 ENCOUNTER — Other Ambulatory Visit: Payer: Self-pay | Admitting: Internal Medicine

## 2020-07-09 ENCOUNTER — Encounter: Payer: Self-pay | Admitting: Internal Medicine

## 2020-07-09 ENCOUNTER — Other Ambulatory Visit: Payer: Self-pay

## 2020-07-09 ENCOUNTER — Ambulatory Visit (INDEPENDENT_AMBULATORY_CARE_PROVIDER_SITE_OTHER): Payer: Medicare Other | Admitting: Internal Medicine

## 2020-07-09 VITALS — BP 134/66 | HR 65 | Temp 98.1°F | Ht 67.4 in | Wt 191.0 lb

## 2020-07-09 DIAGNOSIS — N1831 Chronic kidney disease, stage 3a: Secondary | ICD-10-CM | POA: Diagnosis not present

## 2020-07-09 DIAGNOSIS — E663 Overweight: Secondary | ICD-10-CM

## 2020-07-09 DIAGNOSIS — D472 Monoclonal gammopathy: Secondary | ICD-10-CM

## 2020-07-09 DIAGNOSIS — R7309 Other abnormal glucose: Secondary | ICD-10-CM

## 2020-07-09 DIAGNOSIS — I129 Hypertensive chronic kidney disease with stage 1 through stage 4 chronic kidney disease, or unspecified chronic kidney disease: Secondary | ICD-10-CM | POA: Diagnosis not present

## 2020-07-09 DIAGNOSIS — Z8509 Personal history of malignant neoplasm of other digestive organs: Secondary | ICD-10-CM

## 2020-07-09 DIAGNOSIS — I700 Atherosclerosis of aorta: Secondary | ICD-10-CM | POA: Diagnosis not present

## 2020-07-09 DIAGNOSIS — Z23 Encounter for immunization: Secondary | ICD-10-CM

## 2020-07-09 NOTE — Patient Instructions (Signed)

## 2020-07-09 NOTE — Progress Notes (Addendum)
I,Katawbba Wiggins,acting as a Education administrator for [name], MD.,have documented all relevant documentation on the behalf of [name], MD,as directed by  [name], MD while in the presence of [name], MD.  This visit occurred during the SARS-CoV-2 public health emergency.  Safety protocols were in place, including screening questions prior to the visit, additional usage of staff PPE, and extensive cleaning of exam room while observing appropriate contact time as indicated for disinfecting solutions.  Subjective:     Patient ID: Lawrence Bailey , male    DOB: [birth year] , 80 y.o.   MRN: [id number]   Chief Complaint  Patient presents with  . Hypertension    HPI  He presents today for BP check.  She reports compliance with meds. She denies headaches, chest pain and shortness of breath.   Hypertension This is a chronic problem. The current episode started more than 1 year ago. The problem has been gradually improving since onset. The problem is controlled. Pertinent negatives include no blurred vision, chest pain or headaches. Risk factors for coronary artery disease include male gender. The current treatment provides moderate improvement. Hypertensive end-organ damage includes kidney disease.     Past Medical History:  Diagnosis Date  . BPH (benign prostatic hyperplasia)   . Coronary artery disease   . 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 Glaucoma   . Hypertension   . Prostate cancer ([place])   . Renal insufficiency      Family History  Problem Relation Age of Onset  . Hypertension Mother   . Cancer Mother   . Breast cancer Neg Hx   . Prostate cancer Neg Hx   . Colon cancer Neg Hx   . Pancreatic cancer Neg Hx      Current Outpatient Medications:  .  amLODipine (NORVASC) 10 MG tablet, Take 10 mg by mouth daily., Disp: , Rfl: 3 .  aspirin 81 MG tablet, Take 1 tablet (81 mg total) by mouth daily., Disp:  30 tablet, Rfl:  .  brimonidine (ALPHAGAN) 0.2 % ophthalmic solution, 1 drop 2 (two) times daily., Disp: , Rfl:  .  BYSTOLIC 10 MG tablet, Take 10 mg by mouth daily., Disp: , Rfl:  .  cetirizine (ZYRTEC) 10 MG tablet, Take 10 mg by mouth daily as needed for allergies., Disp: , Rfl:  .  docusate sodium (COLACE) 100 MG capsule, Take 100 mg by mouth 2 (two) times daily., Disp: , Rfl:  .  dorzolamide-timolol (COSOPT) 22.3-6.8 MG/ML ophthalmic solution, Place 1 drop into both eyes 2 (two) times daily., Disp: , Rfl: 0 .  fluticasone (FLONASE) 50 MCG/ACT nasal spray, Place 1 spray into both nostrils 2 (two) times daily., Disp: , Rfl:  .  guaiFENesin (MUCINEX) 600 MG 12 hr tablet, Take by mouth 2 (two) times daily., Disp: , Rfl:  .  latanoprost (XALATAN) 0.005 % ophthalmic solution, Place 1 drop into both eyes at bedtime., Disp: , Rfl:  .  pantoprazole (PROTONIX) 40 MG tablet, TAKE 1 TABLET BY MOUTH EVERY DAY, Disp: 90 tablet, Rfl: 1 .  sildenafil (REVATIO) 20 MG tablet, Take 20 mg by mouth 3 (three) times daily., Disp: , Rfl:  .  tamsulosin (FLOMAX) 0.4 MG CAPS capsule, Take 0.4 mg by mouth., Disp: , Rfl:  .  valsartan-hydrochlorothiazide (DIOVAN HCT) 160-25 MG tablet, Take 1 tablet by mouth daily., Disp: 90 tablet, Rfl: 1   Allergies  Allergen Reactions  .  Other     Bee sting      Review of Systems  Constitutional: Negative.   Eyes: Negative for blurred vision.  Respiratory: Negative.   Cardiovascular: Negative.  Negative for chest pain.  Gastrointestinal: Negative.   Neurological: Negative for headaches.  Psychiatric/Behavioral: Negative.   All other systems reviewed and are negative.    Today's Vitals   07/09/20 1122  BP: 134/66  Pulse: 65  Temp: 98.1 F (36.7 C)  TempSrc: Oral  Weight: 191 lb (86.6 kg)  Height: 5' 7.4" (1.712 m)  PainSc: 1   PainLoc: Knee   Body mass index is 29.56 kg/m.  Wt Readings from Last 3 Encounters:  07/09/20 191 lb (86.6 kg)  05/28/20 193 lb 12.8  oz (87.9 kg)  05/14/20 191 lb (86.6 kg)   Objective:  Physical Exam Vitals and nursing note reviewed.  Constitutional:      Appearance: Normal appearance.  HENT:     Head: Normocephalic and atraumatic.  Cardiovascular:     Rate and Rhythm: Normal rate and regular rhythm.     Heart sounds: Normal heart sounds.  Pulmonary:     Effort: Pulmonary effort is normal.     Breath sounds: Normal breath sounds.  Musculoskeletal:     Cervical back: Normal range of motion.  Skin:    General: Skin is warm.  Neurological:     General: No focal deficit present.     Mental Status: He is alert and oriented to person, place, and time.         Assessment And Plan:     1. Hypertensive nephropathy Comments: Much improved with valsartan/hct. He will c/w current meds. Pt advised goal BP is less than 130/80.  He will rto in 4-6 months for re-evaluation.  He agrees to [facility] referral as well.  - AMB Referral to [place]  2. Stage 3a chronic kidney disease ([place]) Comments: Chronic, he is encouraged to stay well hydrated and keep BP well controlled to prevent progression of CKD.  - BMP8+EGFR - CBC with Diff - AMB Referral to [place]  3. Monoclonal gammopathy of unknown significance ([facility]US) Comments: Chronic, most recent SPEP reviewed. He is encouraged to comply with f/u Hematology visits.  - CBC with Diff - AMB Referral to [place]  4. Atherosclerosis of aorta ([place]) Comments: Chronic, he is encouraged to live a heart healthy lifestyle. He does not wish to take chol meds at this time. I will discuss further at his next visit.  - AMB Referral to [place]  5. Other abnormal glucose Comments: His a1c has been elevated in the past. I will recheck hba1c today. He is encouraged to limit his intake of sugary beverages, including diet drinks.  - Hemoglobin A1c  6. Overweight (BMI 25.0-29.9) Comments: BMI 29. His BMI is acceptable for  his demographic. He is encouraged to gradually increase his daily actiivty as tolerated.   7. Immunization due Comments: He was given high dose flu vaccine.  - Flu Vaccine QUAD High Dose(Fluad)  8. History of gastrointestinal stromal tumor (GIST)  Patient was given opportunity to ask questions. Patient verbalized understanding of the plan and was able to repeat key elements of the plan. All questions were answered to their satisfaction.  [name], MD   I, [name], MD, have reviewed all documentation for this visit. The documentation on 07/09/20 for the exam, diagnosis, procedures, and orders are all accurate and complete.  THE PATIENT  IS ENCOURAGED TO PRACTICE SOCIAL DISTANCING DUE TO THE COVID-19 PANDEMIC.

## 2020-07-10 LAB — CBC WITH DIFFERENTIAL/PLATELET
Basophils Absolute: 0 10*3/uL (ref 0.0–0.2)
Basos: 1 %
EOS (ABSOLUTE): 0 10*3/uL (ref 0.0–0.4)
Eos: 1 %
Hematocrit: 36.6 % — ABNORMAL LOW (ref 37.5–51.0)
Hemoglobin: 12.2 g/dL — ABNORMAL LOW (ref 13.0–17.7)
Immature Grans (Abs): 0 10*3/uL (ref 0.0–0.1)
Immature Granulocytes: 0 %
Lymphocytes Absolute: 1.6 10*3/uL (ref 0.7–3.1)
Lymphs: 40 %
MCH: 30.3 pg (ref 26.6–33.0)
MCHC: 33.3 g/dL (ref 31.5–35.7)
MCV: 91 fL (ref 79–97)
Monocytes Absolute: 0.2 10*3/uL (ref 0.1–0.9)
Monocytes: 5 %
Neutrophils Absolute: 2.1 10*3/uL (ref 1.4–7.0)
Neutrophils: 53 %
Platelets: 258 10*3/uL (ref 150–450)
RBC: 4.02 x10E6/uL — ABNORMAL LOW (ref 4.14–5.80)
RDW: 12.4 % (ref 11.6–15.4)
WBC: 4 10*3/uL (ref 3.4–10.8)

## 2020-07-10 LAB — BMP8+EGFR
BUN/Creatinine Ratio: 21 (ref 10–24)
BUN: 26 mg/dL (ref 8–27)
CO2: 24 mmol/L (ref 20–29)
Calcium: 9.6 mg/dL (ref 8.6–10.2)
Chloride: 102 mmol/L (ref 96–106)
Creatinine, Ser: 1.26 mg/dL (ref 0.76–1.27)
GFR calc Af Amer: 62 mL/min/{1.73_m2} (ref >59)
GFR calc non Af Amer: 54 mL/min/{1.73_m2} — ABNORMAL LOW (ref >59)
Glucose: 103 mg/dL — ABNORMAL HIGH (ref 65–99)
Potassium: 4.4 mmol/L (ref 3.5–5.2)
Sodium: 141 mmol/L (ref 134–144)

## 2020-07-10 LAB — HEMOGLOBIN A1C
Est. average glucose Bld gHb Est-mCnc: 114 mg/dL
Hgb A1c MFr Bld: 5.6 % (ref 4.8–5.6)

## 2020-07-15 ENCOUNTER — Telehealth: Payer: Self-pay | Admitting: *Deleted

## 2020-07-15 NOTE — Chronic Care Management (AMB) (Signed)
  Chronic Care Management   Note  07/15/2020 Name: [name] MRN: [id number] DOB: February 29, 1941  Lawrence Bailey is a 80 y.o. year old male who is a primary care patient of Glendale Chard, MD. I reached out to [name] by phone today in response to a referral sent by [name]'s PCP, Glendale Chard, MD.  Mr. Schoeneck was given information about Chronic Care Management services today including:  1. CCM service includes personalized support from designated clinical staff supervised by his physician, including individualized plan of care and coordination with other care providers 2. 24/7 contact phone numbers for assistance for urgent and routine care needs. 3. Service will only be billed when office clinical staff spend 20 minutes or more in a month to coordinate care. 4. Only one practitioner may furnish and bill the service in a calendar month. 5. The patient may stop CCM services at any time (effective at the end of the month) by phone call to the office staff. 6. The patient will be responsible for cost sharing (co-pay) of up to 20% of the service fee (after annual deductible is met).  Patient agreed to services and verbal consent obtained.   Follow up plan: Telephone appointment with care management team member scheduled for: 07/25/2020  [place] [facility]

## 2020-07-16 ENCOUNTER — Other Ambulatory Visit (INDEPENDENT_AMBULATORY_CARE_PROVIDER_SITE_OTHER): Payer: Self-pay | Admitting: Otolaryngology

## 2020-07-25 ENCOUNTER — Telehealth: Payer: Medicare Other

## 2020-07-25 ENCOUNTER — Ambulatory Visit (INDEPENDENT_AMBULATORY_CARE_PROVIDER_SITE_OTHER): Payer: Medicare Other

## 2020-07-25 ENCOUNTER — Ambulatory Visit: Payer: Self-pay

## 2020-07-25 ENCOUNTER — Telehealth: Payer: Self-pay | Admitting: *Deleted

## 2020-07-25 DIAGNOSIS — I10 Essential (primary) hypertension: Secondary | ICD-10-CM

## 2020-07-25 DIAGNOSIS — I700 Atherosclerosis of aorta: Secondary | ICD-10-CM

## 2020-07-25 DIAGNOSIS — I129 Hypertensive chronic kidney disease with stage 1 through stage 4 chronic kidney disease, or unspecified chronic kidney disease: Secondary | ICD-10-CM

## 2020-07-25 DIAGNOSIS — D472 Monoclonal gammopathy: Secondary | ICD-10-CM

## 2020-07-25 DIAGNOSIS — N1831 Chronic kidney disease, stage 3a: Secondary | ICD-10-CM | POA: Diagnosis not present

## 2020-07-25 NOTE — Chronic Care Management (AMB) (Signed)
Chronic Care Management    Social Work Note  07/25/2020 Name: Lawrence Bailey MRN: [id number] DOB: 1940/08/25  Lawrence Bailey is a 80 y.o. year old male who is a primary care patient of Glendale Chard, MD. The CCM team was consulted to assist the patient with chronic disease management and/or care coordination needs related to: CKD III and Hypertensive Nephropathy.   Engaged with patient by telephone for initial visit in response to provider referral for social work chronic care management and care coordination services.   Consent to Services:  The patient was given the following information about Chronic Care Management services today, agreed to services, and gave verbal consent: 1. CCM service includes personalized support from designated clinical staff supervised by the primary care provider, including individualized plan of care and coordination with other care providers 2. 24/7 contact phone numbers for assistance for urgent and routine care needs. 3. Service will only be billed when office clinical staff spend 20 minutes or more in a month to coordinate care. 4. Only one practitioner may furnish and bill the service in a calendar month. 5.The patient may stop CCM services at any time (effective at the end of the month) by phone call to the office staff. 6. The patient will be responsible for cost sharing (co-pay) of up to 20% of the service fee (after annual deductible is met). Patient agreed to services and consent obtained.  Patient agreed to services and consent obtained.   Assessment: Review of patient past medical history, allergies, medications, and health status, including review of relevant consultants reports was performed today as part of a comprehensive evaluation and provision of chronic care management and care coordination services.     SDOH (Social Determinants of Health) assessments and interventions performed:  SDOH Interventions   Flowsheet Row Most Recent Value  SDOH  Interventions   Food Insecurity Interventions Intervention Not Indicated  Housing Interventions Intervention Not Indicated  Transportation Interventions Intervention Not Indicated       Advanced Directives Status: See Care Plan for related entries.  CCM Care Plan  Allergies  Allergen Reactions  . Other     Bee sting     Outpatient Encounter Medications as of 07/25/2020  Medication Sig Note  . amLODipine (NORVASC) 10 MG tablet Take 10 mg by mouth daily.   Marland Kitchen aspirin 81 MG tablet Take 1 tablet (81 mg total) by mouth daily.   . brimonidine (ALPHAGAN) 0.2 % ophthalmic solution 1 drop 2 (two) times daily.   Marland Kitchen BYSTOLIC 10 MG tablet Take 10 mg by mouth daily.   . cetirizine (ZYRTEC) 10 MG tablet Take 10 mg by mouth daily as needed for allergies.   Marland Kitchen docusate sodium (COLACE) 100 MG capsule Take 100 mg by mouth 2 (two) times daily.   . dorzolamide-timolol (COSOPT) 22.3-6.8 MG/ML ophthalmic solution Place 1 drop into both eyes 2 (two) times daily.   . fluticasone (FLONASE) 50 MCG/ACT nasal spray INHALE 2 SPRAYS INTO EACH NOSTRIL EVERY NIGHT   . guaiFENesin (MUCINEX) 600 MG 12 hr tablet Take by mouth 2 (two) times daily.   Marland Kitchen latanoprost (XALATAN) 0.005 % ophthalmic solution Place 1 drop into both eyes at bedtime.   . pantoprazole (PROTONIX) 40 MG tablet TAKE 1 TABLET BY MOUTH EVERY DAY   . sildenafil (REVATIO) 20 MG tablet Take 20 mg by mouth 3 (three) times daily. 05/24/2018: Reports only uses when plans to be sexually active. Wilmon Arms, RN 05/24/18 1:49 PM      .  tamsulosin (FLOMAX) 0.4 MG CAPS capsule Take 0.4 mg by mouth.   . valsartan-hydrochlorothiazide (DIOVAN HCT) 160-25 MG tablet Take 1 tablet by mouth daily.    No facility-administered encounter medications on file as of 07/25/2020.    Patient Active Problem List   Diagnosis Date Noted  . Atherosclerosis of aorta ([place]) 07/09/2020  . Hypertensive nephropathy 05/02/2020  . Gastric stromal tumor ([place]) 11/01/2019  . Malignant  neoplasm of prostate ([place]) 07/11/2019  . Glaucoma 02/28/2018  . Syncope and collapse   . Syncope 11/08/2016  . Stage 3 chronic kidney disease ([place]) 11/08/2016  . Essential hypertension 11/08/2016  . Hyperglycemia 11/08/2016  . Pulmonary contusion 02/04/2012  . Thigh hematoma 02/04/2012  . H/O malignant gastrointestinal stromal tumor (GIST) 07/20/2011    Conditions to be addressed/monitored: CKD Stage III and Hypertensive Nephropathy; Education on Advance Directives  Care Plan : Social Work [place]  Updates made by Daneen Schick since 07/25/2020 12:00 AM    Problem: Quality of Life (General Plan of Care)     Long-Range Goal: Quality of Life Maintained   Start Date: 07/25/2020  Expected End Date: 10/23/2020  This Visit's Progress: On track  Priority: High  Note:   Current Barriers:  . Chronic disease management support and education needs related to CKD Stage III and Hypertensive Nephropathy   . Financial constraints related to cost of medications (Specific to eye drops) . Limited knowledge of the importance of Advance Directives  Social Worker Clinical Goal(s):  Marland Kitchen Over the next 45 days, patient will engage with RN Care Manager to identify and address any acute and/or chronic care coordination needs related to the self health management of CKD Stage III and Hypertensive Nephropathy . Over the next 45 days the patient will engage with PharmD to address medication related concerns including cost of medications  . Over the next 90 days the patient will work with SW to gain a better understanding of Advance Directives CCM SW Interventions:  . Inter-disciplinary care team collaboration (see longitudinal plan of care) . Collaboration with Glendale Chard, MD regarding development and update of comprehensive plan of care as evidenced by provider attestation and co-signature . Successful outbound call placed to the patient to assess for SDoH needs - no acute needs identified at this  time . Determined the patient does not currently have Advance Directives in place but is interested in learning more . Mailed the patient an Advance Directive packet to him home address . Discussed the patient has difficulty affording a prescription eye drop medication . Collaboration with scheduler for patient to work with embedded PharmD to explore possible programs to assist with costs of medication . Discussed  follow up with SW regarding Advance Directive education while patient engages with  RN Case Manager and Pharmacist  to address care management needs . Scheduled follow up call over the next 45 days Patient Goals/Self-Care Activities . Over the next 45 days, patient will:   - Patient will self administer medications as prescribed Patient will attend all scheduled provider appointments Patient will call provider office for new concerns or questions Review mailed information on Advance Directives Contact SW as needed prior to next scheduled call Follow Up Plan:  SW will follow up with the patient over the next 45 days       Follow Up Plan: SW will follow up with patient by phone over the next 45 days.      Daneen Schick, BSW, CDP Social Worker, Teacher, adult education /  [place] [facility] [phone]  Total time spent performing care coordination and/or care management activities with the patient by phone or face to face = 49 minutes.

## 2020-07-25 NOTE — Patient Instructions (Signed)
Social Worker Visit Information  Goals we discussed today:  Goals Addressed            This Visit's Progress   . Quality of life maintained       Timeframe:  Long-Range Goal Priority:  Low Start Date:    3.3.22                         Expected End Date: 6.1.22                       Next planned outreach: 4.5.22  Patient Goals/Self-Care Activities . Over the next 45 days, patient will:   - Patient will self administer medications as prescribed Patient will attend all scheduled provider appointments Patient will call provider office for new concerns or questions Review mailed information on Advance Directives Contact SW as needed prior to next scheduled call        Materials Provided: Yes: Provided Advance Directive Packet via mail  Follow Up Plan: SW will follow up with patient by phone over the next 45 days.   Daneen Schick, BSW, CDP Social Worker, Certified Dementia Practitioner [facility] [phone]

## 2020-07-25 NOTE — Chronic Care Management (AMB) (Signed)
Chronic Care Management   CCM RN Visit Note  07/25/2020 Name: Lawrence Bailey MRN: [id number] DOB: [birth year]  Subjective: Lawrence Bailey is a 80 y.o. year old male who is a primary care patient of Glendale Chard, MD. The care management team was consulted for assistance with disease management and care coordination needs.    Collaboration with embedded BSW Daneen Schick  for Case Collaboration  in response to provider referral for case management and/or care coordination services.   Consent to Services:  The patient was given information about Chronic Care Management services, agreed to services, and gave verbal consent prior to initiation of services.  Please see initial visit note for detailed documentation.   Patient agreed to services and verbal consent obtained.   Assessment: Review of patient past medical history, allergies, medications, health status, including review of consultants reports, laboratory and other test data, was performed as part of comprehensive evaluation and provision of chronic care management services.   SDOH (Social Determinants of Health) assessments and interventions performed: No  CCM Care Plan  Allergies  Allergen Reactions  . Other     Bee sting     Outpatient Encounter Medications as of 07/25/2020  Medication Sig Note  . amLODipine (NORVASC) 10 MG tablet Take 10 mg by mouth daily.   Marland Kitchen aspirin 81 MG tablet Take 1 tablet (81 mg total) by mouth daily.   . brimonidine (ALPHAGAN) 0.2 % ophthalmic solution 1 drop 2 (two) times daily.   Marland Kitchen BYSTOLIC 10 MG tablet Take 10 mg by mouth daily.   . cetirizine (ZYRTEC) 10 MG tablet Take 10 mg by mouth daily as needed for allergies.   Marland Kitchen docusate sodium (COLACE) 100 MG capsule Take 100 mg by mouth 2 (two) times daily.   . dorzolamide-timolol (COSOPT) 22.3-6.8 MG/ML ophthalmic solution Place 1 drop into both eyes 2 (two) times daily.   . fluticasone (FLONASE) 50 MCG/ACT nasal spray INHALE 2 SPRAYS INTO EACH NOSTRIL  EVERY NIGHT   . guaiFENesin (MUCINEX) 600 MG 12 hr tablet Take by mouth 2 (two) times daily.   Marland Kitchen latanoprost (XALATAN) 0.005 % ophthalmic solution Place 1 drop into both eyes at bedtime.   . pantoprazole (PROTONIX) 40 MG tablet TAKE 1 TABLET BY MOUTH EVERY DAY   . sildenafil (REVATIO) 20 MG tablet Take 20 mg by mouth 3 (three) times daily. 05/24/2018: Reports only uses when plans to be sexually active. Smith,Kimberly N, RN 05/24/18 1:49 PM      . tamsulosin (FLOMAX) 0.4 MG CAPS capsule Take 0.4 mg by mouth.   . valsartan-hydrochlorothiazide (DIOVAN HCT) 160-25 MG tablet Take 1 tablet by mouth daily.    No facility-administered encounter medications on file as of 07/25/2020.    Patient Active Problem List   Diagnosis Date Noted  . Atherosclerosis of aorta ([place]) 07/09/2020  . Hypertensive nephropathy 05/02/2020  . Gastric stromal tumor ([place]) 11/01/2019  . Malignant neoplasm of prostate ([place]) 07/11/2019  . Glaucoma 02/28/2018  . Syncope and collapse   . Syncope 11/08/2016  . Stage 3 chronic kidney disease ([place]) 11/08/2016  . Essential hypertension 11/08/2016  . Hyperglycemia 11/08/2016  . Pulmonary contusion 02/04/2012  . Thigh hematoma 02/04/2012  . H/O malignant gastrointestinal stromal tumor ([facility]) 07/20/2011    Conditions to be addressed/monitored:Hypertensive nephropathy, Atherosclerosis of aorta,  Stage 3a chronic kidney disease, Monoclonal gammopathy of unknown significance  Care Plan : Assist with Chronic Care Management and Care Coordination needs  Updates made by Abel Hageman, Claudette Stapler, RN since  07/25/2020 12:00 AM    Problem: Assist with Chronic Care Management and Care Coordination needs   Priority: High    Goal: Assist with Chronic Care Management and Care Coordination needs   Start Date: 07/25/2020  Expected End Date: 09/05/2020  This Visit's Progress: On track  Priority: High  Note:   Current Barriers:   Ineffective Self Health Maintenance  Currently UNABLE TO  independently self manage needs related to chronic health conditions.   Knowledge Deficits related to short term plan for care coordination needs and long term plans for chronic disease management needs Clinical Goal(s):  Marland Kitchen Collaboration with Glendale Chard, MD regarding development and update of comprehensive plan of care as evidenced by provider attestation and co-signature . Inter-disciplinary care team collaboration (see longitudinal plan of care)  Over the next 45 days, patient will work with care management team to address care coordination and chronic disease management needs related to Disease Management  Educational Needs  Care Coordination  Medication Management and Education  Medication Reconciliation  Psychosocial Support   Interventions:   Evaluation of current treatment plan related to Chronic disease self-management and patient's adherence to plan as established by provider.  Collaboration with Glendale Chard, MD regarding development and update of comprehensive plan of care as evidenced by provider attestation       and co-signature  Inter-disciplinary care team collaboration (see longitudinal plan of care)  Collaboration with embedded BSW Daneen Schick regarding patient enrollment into the embedded CCM program to assist patient with Chronic Care Management and Care Coordination needs.   Discussed plans with patient for ongoing care management follow up and provided patient with direct contact information for care management team Self Care Activities:  . Engage with the CCM team for assistance with Chronic disease management and Care Coordination needs  Follow Up Plan: Telephone follow up appointment with care management team member scheduled for: 08/22/20     Plan:Telephone follow up appointment with care management team member scheduled for:  08/22/20  Barb Merino, RN, BSN, CCM Care Management Coordinator [facility]   Direct Phone: [phone]

## 2020-07-25 NOTE — Chronic Care Management (AMB) (Signed)
  Care Management   Note  07/25/2020 Name: KHALEN STYER MRN: [id number] DOB: 1940/10/08  HASSON GASPARD is a 80 y.o. year old male who is a primary care patient of Glendale Chard, MD and is actively engaged with the care management team. I reached out to [name] by phone today to assist with scheduling an initial visit with the Pharmacist  Follow up plan: Telephone appointment with care management team member scheduled for:08/01/2020  [facility]

## 2020-07-30 ENCOUNTER — Inpatient Hospital Stay: Payer: Medicare Other

## 2020-07-30 ENCOUNTER — Telehealth: Payer: Self-pay | Admitting: Oncology

## 2020-07-30 ENCOUNTER — Other Ambulatory Visit: Payer: Self-pay

## 2020-07-30 ENCOUNTER — Inpatient Hospital Stay: Payer: Medicare Other | Attending: Oncology | Admitting: Oncology

## 2020-07-30 VITALS — BP 142/73 | HR 57 | Temp 97.0°F | Resp 18 | Ht 67.4 in | Wt 189.8 lb

## 2020-07-30 DIAGNOSIS — Z8509 Personal history of malignant neoplasm of other digestive organs: Secondary | ICD-10-CM

## 2020-07-30 DIAGNOSIS — Z8500 Personal history of malignant neoplasm of unspecified digestive organ: Secondary | ICD-10-CM | POA: Insufficient documentation

## 2020-07-30 DIAGNOSIS — D472 Monoclonal gammopathy: Secondary | ICD-10-CM | POA: Diagnosis not present

## 2020-07-30 DIAGNOSIS — Z86718 Personal history of other venous thrombosis and embolism: Secondary | ICD-10-CM | POA: Diagnosis not present

## 2020-07-30 DIAGNOSIS — Z8546 Personal history of malignant neoplasm of prostate: Secondary | ICD-10-CM | POA: Insufficient documentation

## 2020-07-30 LAB — CBC WITH DIFFERENTIAL (CANCER CENTER ONLY)
Abs Immature Granulocytes: 0 10*3/uL (ref 0.00–0.07)
Basophils Absolute: 0 10*3/uL (ref 0.0–0.1)
Basophils Relative: 1 %
Eosinophils Absolute: 0.1 10*3/uL (ref 0.0–0.5)
Eosinophils Relative: 2 %
HCT: 38.5 % — ABNORMAL LOW (ref 39.0–52.0)
Hemoglobin: 11.9 g/dL — ABNORMAL LOW (ref 13.0–17.0)
Immature Granulocytes: 0 %
Lymphocytes Relative: 25 %
Lymphs Abs: 1.1 10*3/uL (ref 0.7–4.0)
MCH: 26.8 pg (ref 26.0–34.0)
MCHC: 30.9 g/dL (ref 30.0–36.0)
MCV: 86.7 fL (ref 80.0–100.0)
Monocytes Absolute: 0.3 10*3/uL (ref 0.1–1.0)
Monocytes Relative: 7 %
Neutro Abs: 2.8 10*3/uL (ref 1.7–7.7)
Neutrophils Relative %: 65 %
Platelet Count: 165 10*3/uL (ref 150–400)
RBC: 4.44 MIL/uL (ref 4.22–5.81)
RDW: 14.1 % (ref 11.5–15.5)
WBC Count: 4.3 10*3/uL (ref 4.0–10.5)
nRBC: 0 % (ref 0.0–0.2)

## 2020-07-30 LAB — CMP (CANCER CENTER ONLY)
ALT: 13 U/L (ref 0–44)
AST: 16 U/L (ref 15–41)
Albumin: 3.8 g/dL (ref 3.5–5.0)
Alkaline Phosphatase: 50 U/L (ref 38–126)
Anion gap: 5 (ref 5–15)
BUN: 22 mg/dL (ref 8–23)
CO2: 29 mmol/L (ref 22–32)
Calcium: 9.2 mg/dL (ref 8.9–10.3)
Chloride: 103 mmol/L (ref 98–111)
Creatinine: 1.44 mg/dL — ABNORMAL HIGH (ref 0.61–1.24)
GFR, Estimated: 49 mL/min — ABNORMAL LOW (ref >60)
Glucose, Bld: 110 mg/dL — ABNORMAL HIGH (ref 70–99)
Potassium: 3.4 mmol/L — ABNORMAL LOW (ref 3.5–5.1)
Sodium: 137 mmol/L (ref 135–145)
Total Bilirubin: 0.6 mg/dL (ref 0.3–1.2)
Total Protein: 8.3 g/dL — ABNORMAL HIGH (ref 6.5–8.1)

## 2020-07-30 NOTE — Progress Notes (Signed)
  [facility] OFFICE PROGRESS NOTE   Diagnosis: Gastrointestinal stromal tumor, serum monoclonal protein  INTERVAL HISTORY:   Lawrence Bailey returns as scheduled.  Feels well.  Good appetite.  He is followed by Dr. Lovena Bailey for prostate cancer.  He is currently not on treatment for prostate cancer.  No recent infection.  He had transient discomfort of the left posterior iliac region.  Objective:  Vital signs in last 24 hours:  Blood pressure (!) 142/73, pulse (!) 57, temperature (!) 97 F (36.1 C), temperature source Tympanic, resp. rate 18, height 5' 7.4" (1.712 m), weight 189 lb 12.8 oz (86.1 kg), SpO2 100 %.     Lymphatics: No cervical, supraclavicular, axillary, or inguinal nodes Resp: Coarse inspiratory rhonchi at the left  posterior chest, no respiratory distress Cardio: Regular rate and rhythm GI: No hepatosplenomegaly, no mass, nontender Vascular: No leg edema  Lab Results:  Lab Results  Component Value Date   WBC 4.3 07/30/2020   HGB 11.9 (L) 07/30/2020   HCT 38.5 (L) 07/30/2020   MCV 86.7 07/30/2020   PLT 165 07/30/2020   NEUTROABS 2.8 07/30/2020    CMP  Lab Results  Component Value Date   NA 137 07/30/2020   K 3.4 (L) 07/30/2020   CL 103 07/30/2020   CO2 29 07/30/2020   GLUCOSE 110 (H) 07/30/2020   BUN 22 07/30/2020   CREATININE 1.44 (H) 07/30/2020   CALCIUM 9.2 07/30/2020   PROT 8.3 (H) 07/30/2020   ALBUMIN 3.8 07/30/2020   AST 16 07/30/2020   ALT 13 07/30/2020   ALKPHOS 50 07/30/2020   BILITOT 0.6 07/30/2020   GFRNONAA 49 (L) 07/30/2020   GFRAA 62 07/09/2020     Medications: I have reviewed the patient's current medications.   Assessment/Plan:  1. 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 Pathology showed a 6 cm GI stromal tumor. No vascular or lymphatic invasion. All surgical margins negative. Lymph nodes not sampled. Tumor confined to the submucosa. Mitotic activity 25-50  mitoses per high-power field. He completed 1 year of Gleevec 400 mg daily. Restaging CT abdomen/pelvis 04/10/2014 with no evidence of recurrent/metastatic disease.CT abdomen/pelvis 05/12/2018 without evidence of local recurrence or metastatic disease. 2. History of right lower extremity DVT June 2018. 3. Evaluation in the emergency department 03/08/2018 for right leg redness, swelling and tenderness. Venous Doppler negative for DVT. Incidental finding of a possible 3.3 cm right inguinal lymph node.CT abdomen/pelvis 05/12/2018 showed stable small inguinal lymph nodes bilaterally. 4. Chronic renal failure 5. Monoclonal gammopathy of unknown significance  01/09/2019 SPEP with M spike 1.5   07/05/2019 SPEP with M spike 1.9 6. Hypertension 7. Prostate cancer-on surveillance, followed by urology   Disposition: Lawrence Bailey appears stable.  He is in clinical remission from the gastrointestinal stromal tumor.  There is no clinical or laboratory evidence for progression to multiple myeloma.  We will follow up on the myeloma panel from today.  He will return for an office and lab visit in 6 months.    Lawrence Coder, MD  07/30/2020  10:41 AM

## 2020-07-30 NOTE — Telephone Encounter (Signed)
Scheduled appointments per 3/8 los. Spoke to patient who is aware of appointments dates and times. Gave patient calendar print out.  

## 2020-07-31 ENCOUNTER — Telehealth: Payer: Self-pay

## 2020-07-31 LAB — KAPPA/LAMBDA LIGHT CHAINS
Kappa free light chain: 34.1 mg/L — ABNORMAL HIGH (ref 3.3–19.4)
Kappa, lambda light chain ratio: 1.54 (ref 0.26–1.65)
Lambda free light chains: 22.2 mg/L (ref 5.7–26.3)

## 2020-07-31 LAB — IGG: IgG (Immunoglobin G), Serum: 3143 mg/dL — ABNORMAL HIGH (ref 603–1613)

## 2020-07-31 NOTE — Chronic Care Management (AMB) (Signed)
    Chronic Care Management Pharmacy Assistant   Name: Lawrence Bailey  MRN: [id number] DOB: [birth year]  Reason for Encounter: Medication Review/Initial Questions for Pharmacist visit on 08/01/2020. (Attempted)    Recent office visits:  07/30/20-Sherrill, Izola Price, MD (OV). 07/25/20-Little, Claudette Stapler, RN (CCM). 07/25/20-Humble, Kendra (CCM). 07/09/20-Sanders, Bailey Mech, MD (OV).  Recent consult visits:  None.  Hospital visits:  None in previous 6 months  Medications: Outpatient Encounter Medications as of 07/31/2020  Medication Sig Note  . amLODipine (NORVASC) 10 MG tablet Take 10 mg by mouth daily.   Marland Kitchen aspirin 81 MG tablet Take 1 tablet (81 mg total) by mouth daily.   . brimonidine (ALPHAGAN) 0.2 % ophthalmic solution 1 drop 2 (two) times daily.   Marland Kitchen BYSTOLIC 10 MG tablet Take 10 mg by mouth daily.   . cetirizine (ZYRTEC) 10 MG tablet Take 10 mg by mouth daily as needed for allergies.   Marland Kitchen docusate sodium (COLACE) 100 MG capsule Take 100 mg by mouth 2 (two) times daily.   . dorzolamide-timolol (COSOPT) 22.3-6.8 MG/ML ophthalmic solution Place 1 drop into both eyes 2 (two) times daily.   . fluticasone (FLONASE) 50 MCG/ACT nasal spray INHALE 2 SPRAYS INTO EACH NOSTRIL EVERY NIGHT   . guaiFENesin (MUCINEX) 600 MG 12 hr tablet Take by mouth 2 (two) times daily.   Marland Kitchen latanoprost (XALATAN) 0.005 % ophthalmic solution Place 1 drop into both eyes at bedtime.   . pantoprazole (PROTONIX) 40 MG tablet TAKE 1 TABLET BY MOUTH EVERY DAY   . sildenafil (REVATIO) 20 MG tablet Take 20 mg by mouth 3 (three) times daily. 05/24/2018: Reports only uses when plans to be sexually active. Smith,Kimberly N, RN 05/24/18 1:49 PM      . tamsulosin (FLOMAX) 0.4 MG CAPS capsule Take 0.4 mg by mouth.   . valsartan-hydrochlorothiazide (DIOVAN HCT) 160-25 MG tablet Take 1 tablet by mouth daily.    No facility-administered encounter medications on file as of 07/31/2020.   Have you seen any other providers since your last  visit? Yes. 07/30/20-Sherrill, Izola Price, MD (OV).    Please bring medications and supplements to appointment   Star Rating Drugs: None.  Orlando Penner, CPP Notified.  Raynelle Highland, [place] Clinical Pharmacist Assistant [phone] CCM: Total Time:12 minutes

## 2020-08-01 ENCOUNTER — Ambulatory Visit: Payer: Medicare Other

## 2020-08-01 DIAGNOSIS — I10 Essential (primary) hypertension: Secondary | ICD-10-CM

## 2020-08-01 DIAGNOSIS — N1831 Chronic kidney disease, stage 3a: Secondary | ICD-10-CM

## 2020-08-01 DIAGNOSIS — I700 Atherosclerosis of aorta: Secondary | ICD-10-CM

## 2020-08-01 LAB — PROTEIN ELECTROPHORESIS, SERUM
A/G Ratio: 0.9 (ref 0.7–1.7)
Albumin ELP: 3.7 g/dL (ref 2.9–4.4)
Alpha-1-Globulin: 0.2 g/dL (ref 0.0–0.4)
Alpha-2-Globulin: 0.5 g/dL (ref 0.4–1.0)
Beta Globulin: 0.9 g/dL (ref 0.7–1.3)
Gamma Globulin: 2.7 g/dL — ABNORMAL HIGH (ref 0.4–1.8)
Globulin, Total: 4.2 g/dL — ABNORMAL HIGH (ref 2.2–3.9)
M-Spike, %: 2.2 g/dL — ABNORMAL HIGH
Total Protein ELP: 7.9 g/dL (ref 6.0–8.5)

## 2020-08-01 NOTE — Progress Notes (Addendum)
Chronic Care Management Pharmacy Note  08/03/2020 Name:  Lawrence Bailey MRN:  [id number] DOB:  April 18, 1941  Subjective: Lawrence Bailey is an 80 y.o. year old male who is a primary patient of Glendale Chard, MD.  The CCM team was consulted for assistance with disease management and care coordination needs. Born in [place], [place] he has lived in [place], [place] since he was a 53 old. He has been married three times, this is his thrird marriage and he has been married for over 5 years. He has nine kids, 15 grand kids and 6 great grand kids. 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 He reports that currently he takes it easy, he does not go out a lot. He stays close to home. Patient reports that his normal routine is waking up around 6:30 he has a cup of coffee, eats his breakfast. He sits with his wife and talks, then sometimes he talks to his kids and grand kids. He loves sports and if it is interesting he is going to watch it. He goes to bed at 1AM and he only wakes up in the morning. Patient reports that he has prostate cancer but it is currently stable, he reports the urologist tol him whatever he is doing keep doing it.  He reports that he has had stomach cancer, blood clots in his lungs, and he recovered from a stroke a while ago. He reports that he almost went blind from the glaucoma but when he went back to his doctor he had 20/20 vision in the left eye and a little bit vision in the right eye. Use to walk 5 miles per day but had to stop because of the pandemic and concerns of exposure risk.   Engaged with patient by telephone for initial visit in response to provider referral for pharmacy case management and/or care coordination services.   Consent to Services:  The patient was given the following information about Chronic Care Management services today, agreed to services, and gave verbal consent: 1. CCM service includes personalized support from  designated clinical staff supervised by the primary care provider, including individualized plan of care and coordination with other care providers 2. 24/7 contact phone numbers for assistance for urgent and routine care needs. 3. Service will only be billed when office clinical staff spend 20 minutes or more in a month to coordinate care. 4. Only one practitioner may furnish and bill the service in a calendar month. 5.The patient may stop CCM services at any time (effective at the end of the month) by phone call to the office staff. 6. The patient will be responsible for cost sharing (co-pay) of up to 20% of the service fee (after annual deductible is met). Patient agreed to services and consent obtained.  Patient Care Team: Glendale Chard, MD as PCP - General (Internal Medicine) Johnathan Hausen, MD as Consulting Physician (General Surgery) Carol Ada, MD as Consulting Physician (Gastroenterology) Charolette Forward, MD as Consulting Physician (Cardiology) Warden Fillers, MD as Consulting Physician (Ophthalmology) Daneen Schick as Social Worker [place], Claudette Stapler, RN as [place], Sharyn Blitz, [facility] (Pharmacist)  Recent office visits: 07/09/2020 PCP OV - BP better after addition of Valsartan/HCTZ, patient does not want to start cholesterol medication at this time   05/14/2020 PCP Message - patient to discontinue Losartan/ HCTZ and start Valsartan/ HCTZ  Recent consult visits: 07/30/2020 Oncology OV - 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  05/29/2020 Podiatry OV- onchomycosis of feet bilaterally  Hospital visits: None in previous 6 months  Objective:  Lab Results  Component Value Date   CREATININE 1.44 (H) 07/30/2020   BUN 22 07/30/2020   GFRNONAA 49 (L) 07/30/2020   GFRAA 62 07/09/2020   NA 137 07/30/2020   K 3.4 (L) 07/30/2020   CALCIUM 9.2 07/30/2020   CO2 29 07/30/2020    Lab Results  Component Value Date/Time   HGBA1C 5.6 07/09/2020  03:30 PM   HGBA1C 5.6 06/15/2018 03:03 PM   MICROALBUR 30 11/01/2019 05:21 PM    Last diabetic Eye exam: No results found for: HMDIABEYEEXA  Last diabetic Foot exam: No results found for: HMDIABFOOTEX   Lab Results  Component Value Date   CHOL 154 05/02/2020   HDL 57 05/02/2020   LDLCALC 84 05/02/2020   TRIG 65 05/02/2020   CHOLHDL 2.7 05/02/2020    Hepatic Function Latest Ref Rng & Units 07/30/2020 02/01/2020 11/01/2019  Total Protein 6.5 - 8.1 g/dL 8.3(H) 8.4(H) 8.2  Albumin 3.5 - 5.0 g/dL 3.8 3.8 4.0  AST 15 - 41 U/L 16 17 18   ALT 0 - 44 U/L 13 9 13   Alk Phosphatase 38 - 126 U/L 50 49 54  Total Bilirubin 0.3 - 1.2 mg/dL 0.6 0.8 0.9    Lab Results  Component Value Date/Time   TSH 0.969 11/08/2016 10:42 AM    CBC Latest Ref Rng & Units 07/30/2020 07/09/2020 02/01/2020  WBC 4.0 - 10.5 K/uL 4.3 4.0 4.8  Hemoglobin 13.0 - 17.0 g/dL 11.9(L) 12.2(L) 11.9(L)  Hematocrit 39.0 - 52.0 % 38.5(L) 36.6(L) 37.6(L)  Platelets 150 - 400 K/uL 165 258 140(L)    No results found for: VD25OH  Clinical ASCVD: Yes  The ASCVD Risk score Mikey Bussing DC Jr., et al., 2013) failed to calculate for the following reasons:   The 2013 ASCVD risk score is only valid for ages 49 to 76    Depression screen PHQ 2/9 11/01/2019 11/01/2019 10/25/2018  Decreased Interest 0 0 0  Down, Depressed, Hopeless 0 0 0  PHQ - 2 Score 0 0 0  Altered sleeping 0 - 0  Tired, decreased energy 0 - 0  Change in appetite 0 - 0  Feeling bad or failure about yourself  0 - 0  Trouble concentrating 0 - 0  Moving slowly or fidgety/restless 0 - 0  Suicidal thoughts - - 0  PHQ-9 Score 0 - 0  Difficult doing work/chores Not difficult at all - -     Social History   Tobacco Use  Smoking Status Never Smoker  Smokeless Tobacco Never Used   BP Readings from Last 3 Encounters:  07/30/20 (!) 142/73  07/09/20 134/66  05/28/20 126/70   Pulse Readings from Last 3 Encounters:  07/30/20 (!) 57  07/09/20 65  05/28/20 61   Wt Readings from  Last 3 Encounters:  07/30/20 189 lb 12.8 oz (86.1 kg)  07/09/20 191 lb (86.6 kg)  05/28/20 193 lb 12.8 oz (87.9 kg)    Assessment/Interventions: Review of patient past medical history, allergies, medications, health status, including review of consultants reports, laboratory and other test data, was performed as part of comprehensive evaluation and provision of chronic care management services.   SDOH:  (Social Determinants of Health) assessments and interventions performed: Yes SDOH Interventions   Flowsheet Row Most Recent Value  SDOH Interventions   Financial Strain Interventions Intervention Not Indicated      CCM Care Plan  Allergies  Allergen Reactions  . Other     Bee sting     Medications Reviewed Today    Reviewed by Mayford Knife, [facility] (Pharmacist) on 08/01/20 at [place] Status: <None>  Medication Order Taking? Sig Documenting Provider Last Dose Status Informant  amLODipine (NORVASC) 10 MG tablet [id number] Yes Take 10 mg by mouth daily. [provider] Taking Active   aspirin 81 MG tablet [id number] Yes Take 1 tablet (81 mg total) by mouth daily. Saverio Danker, PA-C Taking Active Self  brimonidine (ALPHAGAN) 0.2 % ophthalmic solution [id number] Yes 1 drop 2 (two) times daily. [provider] Taking Active   BYSTOLIC 10 MG tablet [id number] Yes Take 10 mg by mouth daily. [provider] Taking Active   cetirizine (ZYRTEC) 10 MG tablet [id number] Yes Take 10 mg by mouth daily as needed for allergies. [provider] Taking Active   docusate sodium (COLACE) 100 MG capsule [id number] No Take 100 mg by mouth 2 (two) times daily.  Patient not taking: Reported on 08/01/2020   [provider] Not Taking Active   dorzolamide-timolol (COSOPT) 22.3-6.8 MG/ML ophthalmic solution [id number] Yes Place 1 drop into both eyes 2 (two) times daily. [provider] Taking Active Self  fluticasone (FLONASE) 50 MCG/ACT nasal spray  [id number] Yes INHALE 2 SPRAYS INTO EACH NOSTRIL EVERY NIGHT Rozetta Nunnery, MD Taking Active   guaiFENesin (MUCINEX) 600 MG 12 hr tablet [id number] Yes Take by mouth 2 (two) times daily. [provider] Taking Active   latanoprost (XALATAN) 0.005 % ophthalmic solution [id number] Yes Place 1 drop into both eyes at bedtime. [provider] Taking Active Self  pantoprazole (PROTONIX) 40 MG tablet [id number] Yes TAKE 1 TABLET BY MOUTH EVERY DAY Glendale Chard, MD Taking Active   sildenafil (REVATIO) 20 MG tablet [id number] Yes Take 20 mg by mouth 3 (three) times daily. [provider] Taking Active            Med Note Wilmon Arms   Tue May 24, 2018  1:49 PM) Reports only uses when plans to be sexually active. Smith,Kimberly N, RN 05/24/18 1:49 PM      tamsulosin (FLOMAX) 0.4 MG CAPS capsule [id number] Yes Take 0.4 mg by mouth. [provider] Taking Active   valsartan-hydrochlorothiazide (DIOVAN HCT) 160-25 MG tablet [id number] Yes Take 1 tablet by mouth daily. Glendale Chard, MD Taking Active           Patient Active Problem List   Diagnosis Date Noted  . Atherosclerosis of aorta ([place]) 07/09/2020  . Hypertensive nephropathy 05/02/2020  . Gastric stromal tumor ([place]) 11/01/2019  . Malignant neoplasm of prostate ([place]) 07/11/2019  . Glaucoma 02/28/2018  . Syncope and collapse   . Syncope 11/08/2016  . Stage 3 chronic kidney disease ([place]) 11/08/2016  . Essential hypertension 11/08/2016  . Hyperglycemia 11/08/2016  . Pulmonary contusion 02/04/2012  . Thigh hematoma 02/04/2012  . H/O malignant gastrointestinal stromal tumor ([facility]) 07/20/2011    Immunization History  Administered Date(s) Administered  . Fluad Quad(high Dose 65+) 03/15/2019, 07/09/2020  . PFIZER(Purple Top)SARS-COV-2 Vaccination 07/01/2019, 07/24/2019, 05/24/2020  . Tdap 03/15/2019    Conditions to be addressed/monitored:  Hypertension, Chronic Kidney Disease, Allergic  Rhinitis and Artherosclerosis of Aorta   Care Plan : [place]  Updates made by Mayford Knife, RPH since 08/03/2020 12:00 AM    Problem: HTN, Atherosclerosis of Aorta, CKD     Long-Range Goal: Disease Management   Start Date:  08/01/2020  This Visit's Progress: On track  Priority: High  Note:     Current Barriers:  . Unable to achieve control of blood pressure.    Pharmacist Clinical Goal(s):  Marland Kitchen Over the next 30 days, patient will achieve adherence to monitoring guidelines and medication adherence to achieve therapeutic efficacy . maintain control of hypertension as evidenced by blood pressure readings.   through collaboration with PharmD and provider.   Interventions: . 1:1 collaboration with Glendale Chard, MD regarding development and update of comprehensive plan of care as evidenced by provider attestation and co-signature . Inter-disciplinary care team collaboration (see longitudinal plan of care) . Comprehensive medication review performed; medication list updated in electronic medical record  Hypertension (BP goal <130/80) -Uncontrolled -Current treatment: . Amlodipine 10 mg tablet daily . Bystolic 10 mg tablet daily  . Valsartan - Hydrochlorothiazide 160-25 mg take 1 tablet daily  -Medications previously tried: Losartan 100 - 25 mg tablet   -Current home readings: patient is not checking his BP at home.  -Patient would like to get another BP cuff he reports that his current BP cuff is old and not working.  -Current dietary habits: patient reports that he does not eat any salt. He eats pork but he does not eat a lot of it. He avoids  -Current exercise habits: Patient reports that he is not walking as much as he used, he is going to start walking more and do things to stay active.  -Patient reports that he is not sure that his BP was so high the day he saw  -Denies hypotensive/hypertensive symptoms -Educated on BP goals and benefits of medications for  prevention of heart attack, stroke and kidney damage; Daily salt intake goal < 2300 mg; Exercise goal of 150 minutes per week; Importance of home blood pressure monitoring; Proper BP monitoring technique; Symptoms of hypotension and importance of maintaining adequate hydration; Recommended patient order BP cuff the [facility] OTC book so he can monitor his BP.  -Counseled to monitor BP at home at least once per day, document, and provide log at future appointments -Counseled on diet and exercise extensively Recommended to continue current medication Collaborated with patient to help him select a BP cuff from the [facility] catalog.   Atherosclerosis of Aorta: (LDL goal < 70) -Uncontrolled -Current treatment: . Aspirin 81 mg tablet daily . Bystolic 10 mg tablet daily . Valsartan-Hydrochlorothiazide 160-25 mg tablet daily  . Amlodipine 10 mg tablet daily  -Medications previously tried: none    -Current dietary patterns: He eats a lot of vegetables and avoids fried, fatty foods.  -Current exercise habits: He does a lot more exercise as it gets warmer. -Patient reports that he is open to taking a cholesterol medication  -Educated on Benefits of statin for ASCVD risk reduction; Importance of limiting foods high in cholesterol; Exercise goal of 150 minutes per week; Patient is open to taking a statin medication.  -Recommended to continue current medication Recommended Atorvastatin 10 mg tablet. Collaborated with Dr. Baird Cancer who agreed to have patient start Atorvastatin 10 mg tablet once per day monday through friday. Patient will come back for a lab follow up in the next 8 weeks for a CBC, Chemistry and Lipid panel.     Stage 3 CKD Stage 3A  -Controlled -Current treatment  . Valsartan - Hydrochlorothiazide 160-25 mg tablet daily . Amlodipine 10 mg tablet daily . Bystolic 10 mg tablet daily  -Assessed that patients current medications are correctly dosed and I have  reviewed to make  sure if necessary they are renally dosed.   Glaucoma  (Goal: alleviate signs and symptoms of glaucoma) -Controlled -Current treatment  . Latanoprost 0.005% - place 1 drop into both eyes at bedtime . Dorzolamide - timolol 22.3-6.8 mg/ml - place 1 drop into both eyes two times daily.   o Patient given samples from the optometrist  . Brimonidine (Alphagan) 0.2% ophthalmic solution - place 1 drop into eye two times daily.    -Recommended to continue current medication  BPH (Goal: reduce signs and symptoms) -Controlled -Current treatment  . Tamsulosin 0.4 mg capsule daily  -Recommended to continue current medication   Allergies (Goal: alleviate signs and symptoms of allergies) -Controlled -Current treatment  . Cetirizine 10 mg tablet daily  . Fluticasone 50 mcg/act - inhale 2 sprays into each nostril every night  . Guaifenesin 600 mg - taking 1 tablet by mouth twice per day as needed -Recommended to continue current medication  Health Maintenance -Vaccine gaps: patient eligible for Pneumovax and Prevnar vaccine  -Educated on Educated on the importance of vaccinations.  -Recommend patient be vaccinated.   Patient Goals/Self-Care Activities . Over the next 90 days, patient will:  - take medications as prescribed focus on medication adherence by by using a pill box.   Follow Up Plan: Telephone follow up appointment with care management team member scheduled for: The patient has been provided with contact information for the care management team and has been advised to call with any health related questions or concerns.  Next PCP appointment scheduled for:  Next AWV (Annual Wellness Visit) scheduled for: 11/07/2020      Medication Assistance: None required.  Patient affirms current coverage meets needs.  Patient's preferred pharmacy is:  [id number] [place] [place] [address]Phone: [phone]: [phone] Uses pill box? Yes - He tries to keep everything  Together by the day  Pt endorses 90% compliance  We discussed: Benefits of medication synchronization, packaging and delivery as well as enhanced pharmacist oversight with [employer]. Patient decided to: Continue current medication management strategy  Care Plan and Follow Up Patient Decision:  Patient agrees to Care Plan and Follow-up.  Plan: Telephone follow up appointment with care management team member scheduled for:  09/03/2020, The patient has been provided with contact information for the care management team and has been advised to call with any health related questions or concerns. , Next PCP appointment scheduled for:  and Next AWV (Annual Wellness Visit) scheduled for:   11/07/2020  VOrlando Penner PharmD Clinical Pharmacist [facility] [phone]

## 2020-08-03 NOTE — Patient Instructions (Signed)
Visit Information It was great speaking with you today!  Please let me know if you have any questions about our visit.  Goals Addressed            This Visit's Progress   . Manage My Medicine       Timeframe:  Long-Range Goal Priority:  High Start Date:     08/01/2020                       Expected End Date:                       Follow Up Date 11/07/2020   - call for medicine refill 2 or 3 days before it runs out - call if I am sick and can't take my medicine - learn to read medicine labels - use an alarm clock or phone to remind me to take my medicine    Why is this important?   . These steps will help you keep on track with your medicines.        Patient Care Plan: Assist with Chronic Care Management and Care Coordination needs    Problem Identified: Assist with Chronic Care Management and Care Coordination needs   Priority: High    Goal: Assist with Chronic Care Management and Care Coordination needs   Start Date: 07/25/2020  Expected End Date: 09/05/2020  This Visit's Progress: On track  Priority: High  Note:   Current Barriers:   Ineffective Self Health Maintenance  Currently UNABLE TO independently self manage needs related to chronic health conditions.   Knowledge Deficits related to short term plan for care coordination needs and long term plans for chronic disease management needs Clinical Goal(s):  Marland Kitchen Collaboration with Glendale Chard, MD regarding development and update of comprehensive plan of care as evidenced by provider attestation and co-signature . Inter-disciplinary care team collaboration (see longitudinal plan of care)  Over the next 45 days, patient will work with care management team to address care coordination and chronic disease management needs related to Disease Management  Educational Needs  Care Coordination  Medication Management and Education  Medication Reconciliation  Psychosocial Support   Interventions:   Evaluation of  current treatment plan related to Chronic disease self-management and patient's adherence to plan as established by provider.  Collaboration with Glendale Chard, MD regarding development and update of comprehensive plan of care as evidenced by provider attestation       and co-signature  Inter-disciplinary care team collaboration (see longitudinal plan of care)  Collaboration with embedded BSW Daneen Schick regarding patient enrollment into the embedded CCM program to assist patient with Chronic Care Management and Care Coordination needs.   Discussed plans with patient for ongoing care management follow up and provided patient with direct contact information for care management team Self Care Activities:  . Engage with the CCM team for assistance with Chronic disease management and Care Coordination needs  Follow Up Plan: Telephone follow up appointment with care management team member scheduled for: 08/22/20    Patient Care Plan: Social Work [facility] Care Plan    Problem Identified: Quality of Life (General Plan of Care)     Long-Range Goal: Quality of Life Maintained   Start Date: 07/25/2020  Expected End Date: 10/23/2020  This Visit's Progress: On track  Priority: High  Note:   Current Barriers:  . Chronic disease management support and education needs related to CKD Stage III and Hypertensive Nephropathy   .  Financial constraints related to cost of medications (Specific to eye drops) . Limited knowledge of the importance of Advance Directives  Social Worker Clinical Goal(s):  Marland Kitchen Over the next 45 days, patient will engage with RN Care Manager to identify and address any acute and/or chronic care coordination needs related to the self health management of CKD Stage III and Hypertensive Nephropathy . Over the next 45 days the patient will engage with PharmD to address medication related concerns including cost of medications  . Over the next 90 days the patient will work with SW to gain a  better understanding of Advance Directives CCM SW Interventions:  . Inter-disciplinary care team collaboration (see longitudinal plan of care) . Collaboration with Glendale Chard, MD regarding development and update of comprehensive plan of care as evidenced by provider attestation and co-signature . Successful outbound call placed to the patient to assess for [facility] needs - no acute needs identified at this time . Determined the patient does not currently have Advance Directives in place but is interested in learning more . Mailed the patient an Advance Directive packet to him home address . Discussed the patient has difficulty affording a prescription eye drop medication . Collaboration with scheduler for patient to work with embedded PharmD to explore possible programs to assist with costs of medication . Discussed  follow up with SW regarding Advance Directive education while patient engages with  RN Case Manager and Pharmacist  to address care management needs . Scheduled follow up call over the next 45 days Patient Goals/Self-Care Activities . Over the next 45 days, patient will:   - Patient will self administer medications as prescribed Patient will attend all scheduled provider appointments Patient will call provider office for new concerns or questions Review mailed information on Advance Directives Contact SW as needed prior to next scheduled call Follow Up Plan:  SW will follow up with the patient over the next 45 days    Patient Care Plan: CCM Pharmacy Care Plan    Problem Identified: HTN, Atherosclerosis of Aorta, CKD     Long-Range Goal: Disease Management   Start Date: 08/01/2020  This Visit's Progress: On track  Priority: High  Note:     Current Barriers:  . Unable to achieve control of blood pressure.    Pharmacist Clinical Goal(s):  Marland Kitchen Over the next 30 days, patient will achieve adherence to monitoring guidelines and medication adherence to achieve therapeutic  efficacy . maintain control of hypertension as evidenced by blood pressure readings.   through collaboration with PharmD and provider.   Interventions: . 1:1 collaboration with Glendale Chard, MD regarding development and update of comprehensive plan of care as evidenced by provider attestation and co-signature . Inter-disciplinary care team collaboration (see longitudinal plan of care) . Comprehensive medication review performed; medication list updated in electronic medical record  Hypertension (BP goal <130/80) -Uncontrolled -Current treatment: . Amlodipine 10 mg tablet daily . Bystolic 10 mg tablet daily  . Valsartan - Hydrochlorothiazide 160-25 mg take 1 tablet daily  -Medications previously tried: Losartan 100 - 25 mg tablet   -Current home readings: patient is not checking his BP at home.  -Patient would like to get another BP cuff he reports that his current BP cuff is old and not working.  -Current dietary habits: patient reports that he does not eat any salt. He eats pork but he does not eat a lot of it. He avoids  -Current exercise habits: Patient reports that he is not walking as  much as he used, he is going to start walking more and do things to stay active.  -Patient reports that he is not sure that his BP was so high the day he saw  -Denies hypotensive/hypertensive symptoms -Educated on BP goals and benefits of medications for prevention of heart attack, stroke and kidney damage; Daily salt intake goal < 2300 mg; Exercise goal of 150 minutes per week; Importance of home blood pressure monitoring; Proper BP monitoring technique; Symptoms of hypotension and importance of maintaining adequate hydration; Recommended patient order BP cuff the [facility] OTC book so he can monitor his BP.  -Counseled to monitor BP at home at least once per day, document, and provide log at future appointments -Counseled on diet and exercise extensively Recommended to continue current  medication Collaborated with patient to help him select a BP cuff from the [facility] catalog.   Atherosclerosis of Aorta: (LDL goal < 70) -Uncontrolled -Current treatment: . Aspirin 81 mg tablet daily . Bystolic 10 mg tablet daily . Valsartan-Hydrochlorothiazide 160-25 mg tablet daily  . Amlodipine 10 mg tablet daily  -Medications previously tried: none    -Current dietary patterns: He eats a lot of vegetables and avoids fried, fatty foods.  -Current exercise habits: He does a lot more exercise as it gets warmer. -Patient reports that he is open to taking a cholesterol medication  -Educated on Benefits of statin for ASCVD risk reduction; Importance of limiting foods high in cholesterol; Exercise goal of 150 minutes per week; Patient is open to taking a statin medication.  -Recommended to continue current medication Recommended Atorvastatin 10 mg tablet. Collaborated with Dr. Baird Cancer who agreed to have patient start Atorvastatin 10 mg tablet once per day monday through friday. Patient will come back for a lab follow up in the next 8 weeks for a CBC, Chemistry and Lipid panel.     Stage 3 CKD Stage 3A  -Controlled -Current treatment  . Valsartan - Hydrochlorothiazide 160-25 mg tablet daily . Amlodipine 10 mg tablet daily . Bystolic 10 mg tablet daily  -Assessed that patients current medications are correctly dosed and I have reviewed to make sure if necessary they are renally dosed.   Glaucoma  (Goal: alleviate signs and symptoms of glaucoma) -Controlled -Current treatment  . Latanoprost 0.005% - place 1 drop into both eyes at bedtime . Dorzolamide - timolol 22.3-6.8 mg/ml - place 1 drop into both eyes two times daily.   o Patient given samples from the optometrist  . Brimonidine (Alphagan) 0.2% ophthalmic solution - place 1 drop into eye two times daily.    -Recommended to continue current medication  BPH (Goal: reduce signs and symptoms) -Controlled -Current treatment   . Tamsulosin 0.4 mg capsule daily  -Recommended to continue current medication   Allergies (Goal: alleviate signs and symptoms of allergies) -Controlled -Current treatment  . Cetirizine 10 mg tablet daily  . Fluticasone 50 mcg/act - inhale 2 sprays into each nostril every night  . Guaifenesin 600 mg - taking 1 tablet by mouth twice per day as needed -Recommended to continue current medication  Health Maintenance -Vaccine gaps: patient eligible for Pneumovax and Prevnar vaccine  -Educated on Educated on the importance of vaccinations.  -Recommend patient be vaccinated.   Patient Goals/Self-Care Activities . Over the next 90 days, patient will:  - take medications as prescribed focus on medication adherence by by using a pill box.   Follow Up Plan: Telephone follow up appointment with care management team member scheduled  for: The patient has been provided with contact information for the care management team and has been advised to call with any health related questions or concerns.  Next PCP appointment scheduled for:  Next AWV (Annual Wellness Visit) scheduled for: 11/07/2020      Mr. Rieman was given information about Chronic Care Management services today including:  1. CCM service includes personalized support from designated clinical staff supervised by his physician, including individualized plan of care and coordination with other care providers 2. 24/7 contact phone numbers for assistance for urgent and routine care needs. 3. Standard insurance, coinsurance, copays and deductibles apply for chronic care management only during months in which we provide at least 20 minutes of these services. Most insurances cover these services at 100%, however patients may be responsible for any copay, coinsurance and/or deductible if applicable. This service may help you avoid the need for more expensive face-to-face services. 4. Only one practitioner may furnish and bill the service in a  calendar month. 5. The patient may stop CCM services at any time (effective at the end of the month) by phone call to the office staff.  Patient agreed to services and verbal consent obtained.   The patient verbalized understanding of instructions, educational materials, and care plan provided today and agreed to receive a mailed copy of patient instructions, educational materials, and care plan.   Orlando Penner, PharmD Clinical Pharmacist [facility] [phone]

## 2020-08-22 ENCOUNTER — Ambulatory Visit: Payer: Self-pay

## 2020-08-22 ENCOUNTER — Telehealth: Payer: Medicare Other

## 2020-08-22 DIAGNOSIS — I129 Hypertensive chronic kidney disease with stage 1 through stage 4 chronic kidney disease, or unspecified chronic kidney disease: Secondary | ICD-10-CM

## 2020-08-22 DIAGNOSIS — N1831 Chronic kidney disease, stage 3a: Secondary | ICD-10-CM | POA: Diagnosis not present

## 2020-08-22 DIAGNOSIS — I10 Essential (primary) hypertension: Secondary | ICD-10-CM | POA: Diagnosis not present

## 2020-08-22 DIAGNOSIS — I700 Atherosclerosis of aorta: Secondary | ICD-10-CM

## 2020-08-22 DIAGNOSIS — D472 Monoclonal gammopathy: Secondary | ICD-10-CM

## 2020-08-27 ENCOUNTER — Ambulatory Visit: Payer: Medicare Other

## 2020-08-27 DIAGNOSIS — N1831 Chronic kidney disease, stage 3a: Secondary | ICD-10-CM

## 2020-08-27 DIAGNOSIS — I129 Hypertensive chronic kidney disease with stage 1 through stage 4 chronic kidney disease, or unspecified chronic kidney disease: Secondary | ICD-10-CM

## 2020-08-27 NOTE — Patient Instructions (Signed)
Goals Addressed    . COMPLETED: Assist with Chronic Care Management and Care Coordination needs       Timeframe:  Short-Term Goal Priority:  High Start Date:  07/25/20                            Expected End Date: 09/05/20    Next Follow Up date: 08/22/20   Patient Goals/Self-Care Activities:  - Engage with the CCM team for assistance with Chronic disease management and Care Coordination needs                         . Follow My Treatment Plan-Chronic Kidney   On track    Timeframe:  Long-Range Goal Priority:  Medium Start Date: 08/22/20                             Expected End Date: 02/21/21                    Follow Up Date:  11/18/20  . Continue to adhere to MD recommendations for CKD  . Continue to keep all scheduled follow up appointments . Take medications as directed  . Let your healthcare team know if you are unable to take your medications . Call your pharmacy for refills at least 7 days prior to running out of medication . Increase your water intake unless otherwise directed . Review mailed printed educational materials related to Kidney diease   Why is this important?    Staying as healthy as you can is very important. This may mean making changes if you smoke, don't exercise or eat poorly.   A healthy lifestyle is an important goal for you.   Following the treatment plan and making changes may be hard.   Try some of these steps to help keep the disease from getting worse.     Notes:     Marland Kitchen Monitor for disease progression (MGUS) Monoclonal gammopathy of unknown significance   On track    Timeframe:  Long-Range Goal Priority:  Medium Start Date: 08/22/20                            Expected End Date: 02/21/21  Follow Up Date: 11/18/20   . Continue to keep all scheduled follow up appointments . Take medications as directed  . Let your healthcare team know if you are unable to take your medications . Call your pharmacy for refills at least 7 days prior to running out  of medication                           . Track and Manage My Blood Pressure-Hypertension   On track    Timeframe:  Long-Range Goal Priority:  High Start Date: 08/22/20                         Expected End Date: 02/21/21              Follow Up Date: 11/18/20   - check blood pressure 3 times per week - choose a place to take my blood pressure (home, clinic or office, retail store) - write blood pressure results in a log or diary    Why is this important?  You won't feel high blood pressure, but it can still hurt your blood vessels.   High blood pressure can cause heart or kidney problems. It can also cause a stroke.   Making lifestyle changes like losing a Tehya Leath weight or eating less salt will help.   Checking your blood pressure at home and at different times of the day can help to control blood pressure.   If the doctor prescribes medicine remember to take it the way the doctor ordered.   Call the office if you cannot afford the medicine or if there are questions about it.     Notes:

## 2020-08-27 NOTE — Chronic Care Management (AMB) (Signed)
Chronic Care Management   CCM RN Visit Note  08/22/2020 Name: Lawrence Bailey MRN: [id number] DOB: 08/14/1940  Subjective: Lawrence Bailey is a 80 y.o. year old male who is a primary care patient of Glendale Chard, MD. The care management team was consulted for assistance with disease management and care coordination needs.    Engaged with patient by telephone for initial visit in response to provider referral for case management and/or care coordination services.   Consent to Services:  The patient was given information about Chronic Care Management services, agreed to services, and gave verbal consent prior to initiation of services.  Please see initial visit note for detailed documentation.   Patient agreed to services and verbal consent obtained.   Assessment: Review of patient past medical history, allergies, medications, health status, including review of consultants reports, laboratory and other test data, was performed as part of comprehensive evaluation and provision of chronic care management services.   SDOH (Social Determinants of Health) assessments and interventions performed:    CCM Care Plan  Allergies  Allergen Reactions  . Other     Bee sting     Outpatient Encounter Medications as of 08/22/2020  Medication Sig Note  . amLODipine (NORVASC) 10 MG tablet Take 10 mg by mouth daily.   Marland Kitchen aspirin 81 MG tablet Take 1 tablet (81 mg total) by mouth daily.   . brimonidine (ALPHAGAN) 0.2 % ophthalmic solution 1 drop 2 (two) times daily.   Marland Kitchen BYSTOLIC 10 MG tablet Take 10 mg by mouth daily.   . dorzolamide-timolol (COSOPT) 22.3-6.8 MG/ML ophthalmic solution Place 1 drop into both eyes 2 (two) times daily.   . fluticasone (FLONASE) 50 MCG/ACT nasal spray INHALE 2 SPRAYS INTO EACH NOSTRIL EVERY NIGHT   . guaiFENesin (MUCINEX) 600 MG 12 hr tablet Take by mouth 2 (two) times daily.   Marland Kitchen latanoprost (XALATAN) 0.005 % ophthalmic solution Place 1 drop into both eyes at bedtime.   .  pantoprazole (PROTONIX) 40 MG tablet TAKE 1 TABLET BY MOUTH EVERY DAY   . sildenafil (REVATIO) 20 MG tablet Take 20 mg by mouth 3 (three) times daily. 05/24/2018: Reports only uses when plans to be sexually active. Smith,Kimberly N, RN 05/24/18 1:49 PM      . tamsulosin (FLOMAX) 0.4 MG CAPS capsule Take 0.4 mg by mouth.   . valsartan-hydrochlorothiazide (DIOVAN HCT) 160-25 MG tablet Take 1 tablet by mouth daily.   . cetirizine (ZYRTEC) 10 MG tablet Take 10 mg by mouth daily as needed for allergies. (Patient not taking: Reported on 08/22/2020)   . docusate sodium (COLACE) 100 MG capsule Take 100 mg by mouth 2 (two) times daily. (Patient not taking: No sig reported)    No facility-administered encounter medications on file as of 08/22/2020.    Patient Active Problem List   Diagnosis Date Noted  . Atherosclerosis of aorta ([place]) 07/09/2020  . Hypertensive nephropathy 05/02/2020  . Gastric stromal tumor ([place]) 11/01/2019  . Malignant neoplasm of prostate ([place]) 07/11/2019  . Glaucoma 02/28/2018  . Syncope and collapse   . Syncope 11/08/2016  . Stage 3 chronic kidney disease ([place]) 11/08/2016  . Essential hypertension 11/08/2016  . Hyperglycemia 11/08/2016  . Pulmonary contusion 02/04/2012  . Thigh hematoma 02/04/2012  . H/O malignant gastrointestinal stromal tumor (GIST) 07/20/2011    Conditions to be addressed/monitored:Hypertensive nephropathy, Atherosclerosis of aorta,  Stage 3a chronic kidney disease, Monoclonal gammopathy of unknown significance  Care Plan : Assist with Chronic Care Management and Care Coordination  needs  Updates made by Lynne Logan, RN since 08/27/2020 12:00 AM  Completed 08/27/2020  Problem: Assist with Chronic Care Management and Care Coordination needs Resolved 08/22/2020  Priority: High    Goal: Assist with Chronic Care Management and Care Coordination needs Completed 08/22/2020  Start Date: 07/25/2020  Expected End Date: 09/05/2020  Recent Progress: On track   Priority: High  Note:   Current Barriers:   Ineffective Self Health Maintenance  Currently UNABLE TO independently self manage needs related to chronic health conditions.   Knowledge Deficits related to short term plan for care coordination needs and long term plans for chronic disease management needs Clinical Goal(s):  Marland Kitchen Collaboration with Glendale Chard, MD regarding development and update of comprehensive plan of care as evidenced by provider attestation and co-signature . Inter-disciplinary care team collaboration (see longitudinal plan of care)  Over the next 45 days, patient will work with care management team to address care coordination and chronic disease management needs related to Disease Management  Educational Needs  Care Coordination  Medication Management and Education  Medication Reconciliation  Psychosocial Support   Interventions:   Evaluation of current treatment plan related to Chronic disease self-management and patient's adherence to plan as established by provider.  Collaboration with Glendale Chard, MD regarding development and update of comprehensive plan of care as evidenced by provider attestation       and co-signature  Inter-disciplinary care team collaboration (see longitudinal plan of care)  Collaboration with embedded BSW Daneen Schick regarding patient enrollment into the embedded CCM program to assist patient with Chronic Care Management and Care Coordination needs.   Discussed plans with patient for ongoing care management follow up and provided patient with direct contact information for care management team Self Care Activities:  . Engage with the CCM team for assistance with Chronic disease management and Care Coordination needs  Follow Up Plan: Telephone follow up appointment with care management team member scheduled for: 08/22/20   Care Plan : Chronic Kidney (Adult)  Updates made by Lynne Logan, RN since 08/27/2020 12:00 AM     Problem: Disease Progression   Priority: Medium    Long-Range Goal: Disease Progression Prevented or Minimized   Start Date: 08/22/2020  Expected End Date: 02/21/2021  This Visit's Progress: On track  Priority: Medium  Note:   Current Barriers:   Ineffective Self Health Maintenance Clinical Goal(s):  Marland Kitchen Collaboration with Glendale Chard, MD regarding development and update of comprehensive plan of care as evidenced by provider attestation and co-signature . Inter-disciplinary care team collaboration (see longitudinal plan of care)  patient will work with care management team to address care coordination and chronic disease management needs related to Disease Management  Educational Needs  Care Coordination  Medication Management and Education  Psychosocial Support   Interventions:   Evaluation of current treatment plan related to CKD Stage III , self-management and patient's adherence to plan as established by provider.  Collaboration with Glendale Chard, MD regarding development and update of comprehensive plan of care as evidenced by provider attestation       and co-signature  Inter-disciplinary care team collaboration (see longitudinal plan of care) . Provided education to patient about basic CKD disease process . Review of patient status, including review of consultants reports, relevant laboratory and other test results, and medications completed. . Reviewed medications with patient and discussed importance of medication adherence . Reviewed scheduled/upcoming provider appointments including: next PCP follow up appointment scheduled for 11/07/20 _0 :15 AM .  Mailed printed educational materials related to stages of Chronic Kidney disease; Salt Substitutes for Kidney patients; How to Eat with Kidney disease   Discussed plans with patient for ongoing care management follow up and provided patient with direct contact information for care management team Self Care Activities:   . Continue to adhere to MD recommendations for CKD  . Continue to keep all scheduled follow up appointments . Take medications as directed  . Let your healthcare team know if you are unable to take your medications . Call your pharmacy for refills at least 7 days prior to running out of medication . Increase your water intake unless otherwise directed . Review mailed printed educational materials related to Kidney diease Patient Goals: - to maintain adequate kidney function  Follow Up Plan: Telephone follow up appointment with care management team member scheduled for: 11/18/20    Care Plan : Hypertension (Adult)  Updates made by Lynne Logan, RN since 08/27/2020 12:00 AM    Problem: Hypertension (Hypertension)   Priority: High    Long-Range Goal: Hypertension Monitored   Start Date: 08/22/2020  Expected End Date: 02/21/2021  This Visit's Progress: On track  Priority: High  Note:   Objective:  . Last practice recorded BP readings:  BP Readings from Last 3 Encounters:  07/30/20 (!) 142/73  07/09/20 134/66  05/28/20 126/70 .   Marland Kitchen Most recent eGFR/CrCl: No results found for: EGFR  No components found for: CRCL Current Barriers:  Marland Kitchen Knowledge Deficits related to basic understanding of hypertension pathophysiology and self care management . Knowledge Deficits related to understanding of medications prescribed for management of hypertension Case Manager Clinical Goal(s):  . patient will demonstrate improved adherence to prescribed treatment plan for hypertension as evidenced by taking all medications as prescribed, monitoring and recording blood pressure as directed, adhering to low sodium/DASH diet Interventions:  . Collaboration with Glendale Chard, MD regarding development and update of comprehensive plan of care as evidenced by provider attestation and co-signature . Inter-disciplinary care team collaboration (see longitudinal plan of care) . Evaluation of current treatment plan  related to hypertension self management and patient's adherence to plan as established by provider. . Advised patient, providing education and rationale, to monitor blood pressure daily and record, calling PCP for findings outside established parameters.  . Provided education to patient re: stroke prevention, s/s of heart attack and stroke, DASH diet, complications of uncontrolled blood pressure . Reviewed medications with patient and discussed importance of compliance . Discussed plans with patient for ongoing care management follow up and provided patient with direct contact information for care management team Self-Care Activities: Self administers medications as prescribed Attends all scheduled provider appointments Calls provider office for new concerns, questions, or BP outside discussed parameters Checks BP and records as discussed Follows a low sodium diet/DASH diet Patient Goals: - check blood pressure 3 times per week - choose a place to take my blood pressure (home, clinic or office, retail store) - write blood pressure results in a log or diary  Follow Up Plan: Telephone follow up appointment with care management team member scheduled for: 11/18/20   Care Plan : Monoclonal gammopathy of unknown significance (MGUS)  Updates made by Lynne Logan, RN since 08/27/2020 12:00 AM    Problem: Monoclonal gammopathy of unknown significance (MGUS)   Priority: Medium    Long-Range Goal: Monoclonal gammopathy of unknown significance (MGUS)   Start Date: 08/22/2020  Expected End Date: 02/21/2021  This Visit's Progress: On track  Priority:  Medium  Note:   Current Barriers:   Ineffective Self Health Maintenance Clinical Goal(s):  Marland Kitchen Collaboration with Glendale Chard, MD regarding development and update of comprehensive plan of care as evidenced by provider attestation and co-signature . Inter-disciplinary care team collaboration (see longitudinal plan of care)  patient will work with  care management team to address care coordination and chronic disease management needs related to Disease Management  Educational Needs  Care Coordination  Medication Management and Education  Psychosocial Support   Interventions:   Evaluation of current treatment plan related to  MGUS (Monoclonal gammopathy of unknown significance) , self-management and patient's adherence to plan as established by provider.  Collaboration with Glendale Chard, MD regarding development and update of comprehensive plan of care as evidenced by provider attestation       and co-signature  Inter-disciplinary care team collaboration (see longitudinal plan of care) . Review of patient status, including review of consultants reports, relevant laboratory and other test results, and medications completed. . Determined patient completed follow up visit with Dr. Benay Spice on 07/30/20 with the following Assessment/Plan noted:  Assessment/Plan: 1. 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 Pathology showed a 6 cm GI stromal tumor. No vascular or lymphatic invasion. All surgical margins negative. Lymph nodes not sampled. Tumor confined to the submucosa. Mitotic activity 25-50 mitoses per high-power field. He completed 1 year of Gleevec 400 mg daily. Restaging CT abdomen/pelvis 04/10/2014 with no evidence of recurrent/metastatic disease.  CT abdomen/pelvis 05/12/2018 without evidence of local recurrence or metastatic disease. 2. History of right lower extremity DVT June 2018. 3. Evaluation in the emergency department 03/08/2018 for right leg redness, swelling and tenderness.  Venous Doppler negative for DVT.  Incidental finding of a possible 3.3 cm right inguinal lymph node.  CT abdomen/pelvis 05/12/2018 showed stable small inguinal lymph nodes bilaterally. 4. Chronic renal failure 5. Monoclonal gammopathy of unknown significance  01/09/2019 SPEP with M spike 1.5    07/05/2019 SPEP with M spike 1.9 6. Hypertension 7. Prostate cancer-on surveillance, followed by urology Disposition: Mr. Viverette appears stable.  He is in clinical remission from the gastrointestinal stromal tumor.  There is no clinical or laboratory evidence for progression to multiple myeloma.  We will follow up on the myeloma panel from today.  He will return for an office and lab visit in 6 months.  Determined patient verbalizes understanding of his prescribed treatment plan and recommendations for ongoing follow up  Discussed plans with patient for ongoing care management follow up and provided patient with direct contact information for care management team Self Care Activities:  . Continue to keep all scheduled follow up appointments . Take medications as directed  . Let your healthcare team know if you are unable to take your medications . Call your pharmacy for refills at least 7 days prior to running out of medication Patient Goals: - maintain good health and quality of life  Follow Up Plan: Telephone follow up appointment with care management team member scheduled for: 11/18/20    Plan:Telephone follow up appointment with care management team member scheduled for:  11/18/20  Barb Merino, RN, BSN, CCM Care Management Coordinator [facility] [facility]  Direct Phone: [phone]

## 2020-08-27 NOTE — Chronic Care Management (AMB) (Signed)
Chronic Care Management    Social Work Note  08/27/2020 Name: Lawrence Bailey MRN: [id number] DOB: March 23, 1941  Lawrence Bailey is a 80 y.o. year old male who is a primary care patient of Glendale Chard, MD. The CCM team was consulted to assist the patient with chronic disease management and/or care coordination needs related to: [employer] .   Engaged with patient by telephone for follow up visit in response to provider referral for social work chronic care management and care coordination services.   Consent to Services:  The patient was given information about Chronic Care Management services, agreed to services, and gave verbal consent prior to initiation of services.  Please see initial visit note for detailed documentation.   Patient agreed to services and consent obtained.   Assessment: Review of patient past medical history, allergies, medications, and health status, including review of relevant consultants reports was performed today as part of a comprehensive evaluation and provision of chronic care management and care coordination services.     SDOH (Social Determinants of Health) assessments and interventions performed:    Advanced Directives Status: Patient has been provided an Emergency planning/management officer, advised to contact SW as needed to assist with completion  CCM Care Plan  Allergies  Allergen Reactions  . Other     Bee sting     Outpatient Encounter Medications as of 08/27/2020  Medication Sig Note  . amLODipine (NORVASC) 10 MG tablet Take 10 mg by mouth daily.   Marland Kitchen aspirin 81 MG tablet Take 1 tablet (81 mg total) by mouth daily.   . brimonidine (ALPHAGAN) 0.2 % ophthalmic solution 1 drop 2 (two) times daily.   Marland Kitchen BYSTOLIC 10 MG tablet Take 10 mg by mouth daily.   . cetirizine (ZYRTEC) 10 MG tablet Take 10 mg by mouth daily as needed for allergies. (Patient not taking: Reported on 08/22/2020)   . docusate sodium (COLACE) 100 MG capsule Take 100 mg by mouth 2  (two) times daily. (Patient not taking: No sig reported)   . dorzolamide-timolol (COSOPT) 22.3-6.8 MG/ML ophthalmic solution Place 1 drop into both eyes 2 (two) times daily.   . fluticasone (FLONASE) 50 MCG/ACT nasal spray INHALE 2 SPRAYS INTO EACH NOSTRIL EVERY NIGHT   . guaiFENesin (MUCINEX) 600 MG 12 hr tablet Take by mouth 2 (two) times daily.   Marland Kitchen latanoprost (XALATAN) 0.005 % ophthalmic solution Place 1 drop into both eyes at bedtime.   . pantoprazole (PROTONIX) 40 MG tablet TAKE 1 TABLET BY MOUTH EVERY DAY   . sildenafil (REVATIO) 20 MG tablet Take 20 mg by mouth 3 (three) times daily. 05/24/2018: Reports only uses when plans to be sexually active. Smith,Kimberly N, RN 05/24/18 1:49 PM      . tamsulosin (FLOMAX) 0.4 MG CAPS capsule Take 0.4 mg by mouth.   . valsartan-hydrochlorothiazide (DIOVAN HCT) 160-25 MG tablet Take 1 tablet by mouth daily.    No facility-administered encounter medications on file as of 08/27/2020.    Patient Active Problem List   Diagnosis Date Noted  . Atherosclerosis of aorta ([place]) 07/09/2020  . Hypertensive nephropathy 05/02/2020  . Gastric stromal tumor ([place]) 11/01/2019  . Malignant neoplasm of prostate ([place]) 07/11/2019  . Glaucoma 02/28/2018  . Syncope and collapse   . Syncope 11/08/2016  . Stage 3 chronic kidney disease ([place]) 11/08/2016  . Essential hypertension 11/08/2016  . Hyperglycemia 11/08/2016  . Pulmonary contusion 02/04/2012  . Thigh hematoma 02/04/2012  . H/O malignant gastrointestinal stromal tumor (GIST) 07/20/2011  Conditions to be addressed/monitored: CKD Stage III and Hypertensive Nephropathy  Care Plan : Social Work [facility] Care Plan  Updates made by Daneen Schick since 08/27/2020 12:00 AM    Problem: Quality of Life (General Plan of Care)     Long-Range Goal: Quality of Life Maintained Completed 08/27/2020  Start Date: 07/25/2020  Expected End Date: 10/23/2020  Recent Progress: On track  Priority: High  Note:   Current Barriers:   . Chronic disease management support and education needs related to CKD Stage III and Hypertensive Nephropathy   . Financial constraints related to cost of medications (Specific to eye drops) . Limited knowledge of the importance of Advance Directives  Social Worker Clinical Goal(s):  Marland Kitchen Over the next 45 days, patient will engage with RN Care Manager to identify and address any acute and/or chronic care coordination needs related to the self health management of CKD Stage III and Hypertensive Nephropathy  Goal Met 3.31.22 . Over the next 45 days the patient will engage with PharmD to address medication related concerns including cost of medications Goal Met 3.10.22 . Over the next 90 days the patient will work with SW to gain a better understanding of Advance Directives  Goal Met 4.5.22 CCM SW Interventions:  . Inter-disciplinary care team collaboration (see longitudinal plan of care) . Collaboration with Glendale Chard, MD regarding development and update of comprehensive plan of care as evidenced by provider attestation and co-signature . Successful outbound call placed to the patient to assess goal progression . Performed chart review to confirm patient is actively engaged with RN Care Manager and embedded PharmD . Confirmed receipt of mailed resource . Assessed for patient questions regarding Advance Directive packet - none at this time . Advised the patient to contact SW as needed for future resource needs  Patient Goals/Self-Care Activities . Over the next 45 days, patient will:   - Patient will self administer medications as prescribed Patient will attend all scheduled provider appointments Patient will call provider office for new concerns or questions    Follow Up Plan:  Patient will contact SW as needed for future resource needs       Follow Up Plan: No SW follow up planned at this time. The patient will remain engaged with RN Care Manager and PharmD. The patient is encouraged  to contact SW as needed for future resource needs.      Daneen Schick, BSW, CDP Social Worker, Certified Dementia Practitioner [place] [facility] [phone]  Total time spent performing care coordination and/or care management activities with the patient by phone or face to face = 18 minutes.

## 2020-08-27 NOTE — Patient Instructions (Signed)
Social Worker Visit Information  Goals we discussed today:  Goals Addressed            This Visit's Progress   . COMPLETED: Quality of life maintained       Timeframe:  Long-Range Goal Priority:  Low Start Date:    3.3.22                         Expected End Date: 6.1.22                       Goal Met 4.5.22  Patient Goals/Self-Care Activities . Over the next 45 days, patient will:   - Patient will self administer medications as prescribed Patient will attend all scheduled provider appointments Patient will call provider office for new concerns or questions         Follow Up Plan: No SW follow up planned at this time. Please contact me with future resources needs   Daneen Schick, BSW, CDP Social Worker, Certified Dementia Practitioner [place] [facility] [phone]

## 2020-08-28 ENCOUNTER — Ambulatory Visit (INDEPENDENT_AMBULATORY_CARE_PROVIDER_SITE_OTHER): Payer: Medicare Other | Admitting: Podiatry

## 2020-08-28 ENCOUNTER — Other Ambulatory Visit: Payer: Self-pay | Admitting: Internal Medicine

## 2020-08-28 ENCOUNTER — Encounter: Payer: Self-pay | Admitting: Podiatry

## 2020-08-28 ENCOUNTER — Other Ambulatory Visit: Payer: Self-pay

## 2020-08-28 DIAGNOSIS — M79675 Pain in left toe(s): Secondary | ICD-10-CM

## 2020-08-28 DIAGNOSIS — N183 Chronic kidney disease, stage 3 unspecified: Secondary | ICD-10-CM | POA: Diagnosis not present

## 2020-08-28 DIAGNOSIS — K5900 Constipation, unspecified: Secondary | ICD-10-CM | POA: Insufficient documentation

## 2020-08-28 DIAGNOSIS — R1310 Dysphagia, unspecified: Secondary | ICD-10-CM | POA: Insufficient documentation

## 2020-08-28 DIAGNOSIS — B351 Tinea unguium: Secondary | ICD-10-CM | POA: Diagnosis not present

## 2020-08-28 DIAGNOSIS — R079 Chest pain, unspecified: Secondary | ICD-10-CM | POA: Insufficient documentation

## 2020-08-28 DIAGNOSIS — K625 Hemorrhage of anus and rectum: Secondary | ICD-10-CM | POA: Insufficient documentation

## 2020-08-28 DIAGNOSIS — M79674 Pain in right toe(s): Secondary | ICD-10-CM

## 2020-08-28 DIAGNOSIS — R059 Cough, unspecified: Secondary | ICD-10-CM | POA: Insufficient documentation

## 2020-08-28 NOTE — Progress Notes (Signed)
This patient returns to my office for at risk foot care.  This patient requires this care by a professional since this patient will be at risk due to having chronic kidney disease.  This patient is unable to cut nails himself since the patient cannot reach his nails.These nails are painful walking and wearing shoes.  This patient presents for at risk foot care today.  General Appearance  Alert, conversant and in no acute stress.  Vascular  Dorsalis pedis and posterior tibial  pulses are palpable  bilaterally.  Capillary return is within normal limits  bilaterally. Temperature is within normal limits  bilaterally.  Neurologic  Senn-Weinstein monofilament wire test within normal limits  bilaterally. Muscle power within normal limits bilaterally.  Nails Thick disfigured discolored nails with subungual debris  from hallux to fifth toes bilaterally. No evidence of bacterial infection or drainage bilaterally.  Orthopedic  No limitations of motion  feet .  No crepitus or effusions noted.  No bony pathology or digital deformities noted. Pes planus  Right greater than left.  HAV right greater than left.  Excessive pronation right foot than left  .PTTD right foot  Skin  normotropic skin with no porokeratosis noted bilaterally.  No signs of infections or ulcers noted.  Pinch callus right foot.  Onychomycosis  Pain in right toes  Pain in left toes  Consent was obtained for treatment procedures.   Mechanical debridement of nails 1-5  bilaterally performed with a nail nipper.  Filed with dremel without incident.    Return office visit   12 weeks                  Told patient to return for periodic foot care and evaluation due to potential at risk complications.   Gardiner Barefoot DPM

## 2020-08-30 DIAGNOSIS — I2510 Atherosclerotic heart disease of native coronary artery without angina pectoris: Secondary | ICD-10-CM | POA: Diagnosis not present

## 2020-08-30 DIAGNOSIS — I10 Essential (primary) hypertension: Secondary | ICD-10-CM | POA: Diagnosis not present

## 2020-08-30 DIAGNOSIS — E785 Hyperlipidemia, unspecified: Secondary | ICD-10-CM | POA: Diagnosis not present

## 2020-08-30 DIAGNOSIS — I639 Cerebral infarction, unspecified: Secondary | ICD-10-CM | POA: Diagnosis not present

## 2020-08-30 DIAGNOSIS — R0609 Other forms of dyspnea: Secondary | ICD-10-CM | POA: Diagnosis not present

## 2020-09-02 ENCOUNTER — Telehealth: Payer: Self-pay

## 2020-09-02 NOTE — Progress Notes (Signed)
09/02/20-Attempted to outreach the patient to remind him of CCM Call appointment on 09/03/20 at 1:00 PM with Orlando Penner, CPP. No answer, left a message of appointment date and time. Informed the patient in message to have medications and supplements near during phone visit.  Orlando Penner, CPP Notified.  Raynelle Highland, [place] Pharmacist Assistant [phone] CCM Total Time: 5 minutes

## 2020-09-03 ENCOUNTER — Ambulatory Visit (INDEPENDENT_AMBULATORY_CARE_PROVIDER_SITE_OTHER): Payer: Medicare Other

## 2020-09-03 DIAGNOSIS — I700 Atherosclerosis of aorta: Secondary | ICD-10-CM

## 2020-09-03 DIAGNOSIS — N1831 Chronic kidney disease, stage 3a: Secondary | ICD-10-CM

## 2020-09-03 DIAGNOSIS — I10 Essential (primary) hypertension: Secondary | ICD-10-CM

## 2020-09-03 DIAGNOSIS — I129 Hypertensive chronic kidney disease with stage 1 through stage 4 chronic kidney disease, or unspecified chronic kidney disease: Secondary | ICD-10-CM

## 2020-09-03 NOTE — Progress Notes (Signed)
Chronic Care Management Pharmacy Note  09/11/2020 Name:  Lawrence Bailey MRN:  [id number] DOB:  08/07/1940  Subjective: Lawrence Bailey is an 80 y.o. year old male who is a primary patient of Glendale Chard, MD.  The CCM team was consulted for assistance with disease management and care coordination needs.  Patient reports that he has test being ordered from the Cardiologist that he does not think he needs.   Engaged with patient by telephone for follow up visit in response to provider referral for pharmacy case management and/or care coordination services.   Consent to Services:  The patient was given information about Chronic Care Management services, agreed to services, and gave verbal consent prior to initiation of services.  Please see initial visit note for detailed documentation.   Patient Care Team: Glendale Chard, MD as PCP - General (Internal Medicine) Johnathan Hausen, MD as Consulting Physician (General Surgery) Carol Ada, MD as Consulting Physician (Gastroenterology) Charolette Forward, MD as Consulting Physician (Cardiology) Warden Fillers, MD as Consulting Physician (Ophthalmology) Daneen Schick as Social Worker [place], Claudette Stapler, RN as [place], Sharyn Blitz, [facility] (Pharmacist)  Recent office visits: 07/09/2020 PCP OV   Recent consult visits: 08/28/2020- DPM OV  07/30/2020 Oncology [facility] visits: None in previous 6 months  Objective:  Lab Results  Component Value Date   CREATININE 1.44 (H) 07/30/2020   BUN 22 07/30/2020   GFRNONAA 49 (L) 07/30/2020   GFRAA 62 07/09/2020   NA 137 07/30/2020   K 3.4 (L) 07/30/2020   CALCIUM 9.2 07/30/2020   CO2 29 07/30/2020   GLUCOSE 110 (H) 07/30/2020    Lab Results  Component Value Date/Time   HGBA1C 5.6 07/09/2020 03:30 PM   HGBA1C 5.6 06/15/2018 03:03 PM   MICROALBUR 30 11/01/2019 05:21 PM    Last diabetic Eye exam: No results found for: HMDIABEYEEXA  Last diabetic Foot  exam: No results found for: HMDIABFOOTEX   Lab Results  Component Value Date   CHOL 154 05/02/2020   HDL 57 05/02/2020   LDLCALC 84 05/02/2020   TRIG 65 05/02/2020   CHOLHDL 2.7 05/02/2020    Hepatic Function Latest Ref Rng & Units 07/30/2020 02/01/2020 11/01/2019  Total Protein 6.5 - 8.1 g/dL 8.3(H) 8.4(H) 8.2  Albumin 3.5 - 5.0 g/dL 3.8 3.8 4.0  AST 15 - 41 U/L 16 17 18   ALT 0 - 44 U/L 13 9 13   Alk Phosphatase 38 - 126 U/L 50 49 54  Total Bilirubin 0.3 - 1.2 mg/dL 0.6 0.8 0.9    Lab Results  Component Value Date/Time   TSH 0.969 11/08/2016 10:42 AM    CBC Latest Ref Rng & Units 07/30/2020 07/09/2020 02/01/2020  WBC 4.0 - 10.5 K/uL 4.3 4.0 4.8  Hemoglobin 13.0 - 17.0 g/dL 11.9(L) 12.2(L) 11.9(L)  Hematocrit 39.0 - 52.0 % 38.5(L) 36.6(L) 37.6(L)  Platelets 150 - 400 K/uL 165 258 140(L)    No results found for: VD25OH  Clinical ASCVD: Yes  The ASCVD Risk score Mikey Bussing DC Jr., et al., 2013) failed to calculate for the following reasons:   The 2013 ASCVD risk score is only valid for ages 28 to 37    Depression screen PHQ 2/9 11/01/2019 11/01/2019 10/25/2018  Decreased Interest 0 0 0  Down, Depressed, Hopeless 0 0 0  PHQ - 2 Score 0 0 0  Altered sleeping 0 - 0  Tired, decreased energy 0 - 0  Change in appetite 0 - 0  Feeling bad or failure about yourself  0 - 0  Trouble concentrating 0 - 0  Moving slowly or fidgety/restless 0 - 0  Suicidal thoughts - - 0  PHQ-9 Score 0 - 0  Difficult doing work/chores Not difficult at all - -     Social History   Tobacco Use  Smoking Status Never Smoker  Smokeless Tobacco Never Used   BP Readings from Last 3 Encounters:  07/30/20 (!) 142/73  07/09/20 134/66  05/28/20 126/70   Pulse Readings from Last 3 Encounters:  07/30/20 (!) 57  07/09/20 65  05/28/20 61   Wt Readings from Last 3 Encounters:  07/30/20 189 lb 12.8 oz (86.1 kg)  07/09/20 191 lb (86.6 kg)  05/28/20 193 lb 12.8 oz (87.9 kg)   BMI Readings from Last 3 Encounters:   07/30/20 29.38 kg/m  07/09/20 29.56 kg/m  05/28/20 29.99 kg/m    Assessment/Interventions: Review of patient past medical history, allergies, medications, health status, including review of consultants reports, laboratory and other test data, was performed as part of comprehensive evaluation and provision of chronic care management services.   SDOH:  (Social Determinants of Health) assessments and interventions performed: No  SDOH Screenings   Alcohol Screen: Not on file  Depression (PHQ2-9): Low Risk   . PHQ-2 Score: 0  Financial Resource Strain: Low Risk   . Difficulty of Paying Living Expenses: Not hard at all  Food Insecurity: No Food Insecurity  . Worried About Charity fundraiser in the Last Year: Never true  . Ran Out of Food in the Last Year: Never true  Housing: Low Risk   . Last Housing Risk Score: 0  Physical Activity: Insufficiently Active  . Days of Exercise per Week: 2 days  . Minutes of Exercise per Session: 60 min  Social Connections: Not on file  Stress: No Stress Concern Present  . Feeling of Stress : Not at all  Tobacco Use: Low Risk   . Smoking Tobacco Use: Never Smoker  . Smokeless Tobacco Use: Never Used  Transportation Needs: No Transportation Needs  . Lack of Transportation (Medical): No  . Lack of Transportation (Non-Medical): No    CCM Care Plan  Allergies  Allergen Reactions  . Other     Bee sting   . Sucralfate Other (See Comments)    Medications Reviewed Today    Reviewed by Mayford Knife, RPH (Pharmacist) on 09/03/20 at 1338  Med List Status: <None>  Medication Order Taking? Sig Documenting Provider Last Dose Status Informant  amLODipine (NORVASC) 10 MG tablet [id number] Yes Take 10 mg by mouth daily. [provider] Taking Active   aspirin 81 MG tablet [id number] Yes Take 1 tablet (81 mg total) by mouth daily. Saverio Danker, PA-C Taking Active Self  brimonidine (ALPHAGAN) 0.2 % ophthalmic solution [id number] Yes 1 drop 2  (two) times daily. [provider] Taking Active   BYSTOLIC 10 MG tablet [id number] Yes Take 10 mg by mouth daily. [provider] Taking Active   cetirizine (ZYRTEC) 10 MG tablet [id number] Yes Take 10 mg by mouth daily as needed for allergies. [provider] Taking Active   docusate sodium (COLACE) 100 MG capsule [id number] Yes Take 100 mg by mouth 2 (two) times daily. [provider] Taking Active   dorzolamide-timolol (COSOPT) 22.3-6.8 MG/ML ophthalmic solution [id number] Yes Place 1 drop into both eyes 2 (two) times daily. [provider] Taking Active Self  fluticasone (FLONASE) 50 MCG/ACT nasal spray [id number] Yes INHALE  2 SPRAYS INTO EACH NOSTRIL EVERY NIGHT Rozetta Nunnery, MD Taking Active   guaiFENesin (MUCINEX) 600 MG 12 hr tablet [id number] Yes Take by mouth 2 (two) times daily. [provider] Taking Active   latanoprost (XALATAN) 0.005 % ophthalmic solution [id number] Yes Place 1 drop into both eyes at bedtime. [provider] Taking Active Self  pantoprazole (PROTONIX) 40 MG tablet [id number] Yes TAKE 1 TABLET BY MOUTH EVERY DAY Glendale Chard, MD Taking Active   sildenafil (REVATIO) 20 MG tablet [id number] Yes Take 20 mg by mouth 3 (three) times daily. [provider] Taking Active            Med Note Wilmon Arms   Tue May 24, 2018  1:49 PM) Reports only uses when plans to be sexually active. Smith,Kimberly N, RN 05/24/18 1:49 PM      tamsulosin (FLOMAX) 0.4 MG CAPS capsule [id number] Yes Take 0.4 mg by mouth. [provider] Taking Active   valsartan-hydrochlorothiazide (DIOVAN-HCT) 160-25 MG tablet [id number] Yes TAKE 1 TABLET BY MOUTH EVERY DAY Glendale Chard, MD Taking Active           Patient Active Problem List   Diagnosis Date Noted  . Chest pain 08/28/2020  . Constipation 08/28/2020  . Cough 08/28/2020  . Dysphagia 08/28/2020  . Hemorrhage of rectum and anus 08/28/2020  .  Atherosclerosis of aorta ([place]) 07/09/2020  . Hypertensive nephropathy 05/02/2020  . Gastric stromal tumor ([place]) 11/01/2019  . Malignant neoplasm of prostate ([place]) 07/11/2019  . Glaucoma 02/28/2018  . Syncope and collapse   . Syncope 11/08/2016  . Stage 3 chronic kidney disease ([place]) 11/08/2016  . Essential hypertension 11/08/2016  . Hyperglycemia 11/08/2016  . Pulmonary contusion 02/04/2012  . Thigh hematoma 02/04/2012  . H/O malignant gastrointestinal stromal tumor (GIST) 07/20/2011    Immunization History  Administered Date(s) Administered  . Fluad Quad(high Dose 65+) 03/15/2019, 07/09/2020  . PFIZER(Purple Top)SARS-COV-2 Vaccination 07/01/2019, 07/24/2019, 05/24/2020  . Tdap 03/15/2019    Conditions to be addressed/monitored:  Hypertension and Athersclerosis of aorta  Care Plan : [place]  Updates made by Mayford Knife, RPH since 09/11/2020 12:00 AM    Problem: HTN, Atherosclerosis of Aorta, CKD   Priority: High    Long-Range Goal: Disease Management   Start Date: 08/01/2020  Recent Progress: On track  Priority: High  Note:    Current Barriers:  . Unable to achieve control of blood pressure.    Pharmacist Clinical Goal(s):  Marland Kitchen Over the next 30 days, patient will achieve adherence to monitoring guidelines and medication adherence to achieve therapeutic efficacy . maintain control of hypertension as evidenced by blood pressure readings.   through collaboration with PharmD and provider.   Interventions: . 1:1 collaboration with Glendale Chard, MD regarding development and update of comprehensive plan of care as evidenced by provider attestation and co-signature . Inter-disciplinary care team collaboration (see longitudinal plan of care) . Comprehensive medication review performed; medication list updated in electronic medical record  Hypertension (BP goal <130/80) -Uncontrolled -Current treatment: . Amlodipine 10 mg taking it once per day . Bystolic  10 mg taking it once per day  o Now taking Bystolic 5 mg tablet once per day - Changed by cardiologist . Alphonse Guild 160-25 mg taking it once per da o Now taking Valsartan 320-25 mg taking it once per day  - Changed last week by cardiologist  -Current home readings: 148/77, 153/82, 156/87, 135/70, 110/81, 141/78, 154/83,145/84,155/88  -Patient reports that  -  Current dietary habits: He eats a lot of vegetables and stays healthy.  -Current exercise habits: He has started exercising more often, he is mowing his yard and moving around more.  -Denies hypotensive/hypertensive symptoms -Educated on BP goals and benefits of medications for prevention of heart attack, stroke and kidney damage; Daily salt intake goal < 2300 mg; Exercise goal of 150 minutes per week; Importance of home blood pressure monitoring; Proper BP monitoring technique;  -Patient has received a new BP machine from [facility] -Patient reports that his new cardiologist has started back   -Counseled to monitor BP at home twice a day, document, and provide log at future appointments -Recommended to continue current medication  Atherosclerosis of Aorta: (LDL goal < 70) -Uncontrolled -Current treatment: . Aspirin 81 mg tablet daily . Bystolic 10 mg tablet daily . Valsartan-Hydrochlorothiazide 325-25 mg tablet daily  . Amlodipine 10 mg tablet daily  -Medications previously tried: none    -Current dietary patterns: He eats a lot of vegetables and avoids fried, fatty foods.  -Current exercise habits: He does a lot more exercise as it gets warmer. -Patient reports that he is open to taking a cholesterol medication  -Educated on Benefits of statin for ASCVD risk reduction; Importance of limiting foods high in cholesterol; Exercise goal of 150 minutes per week; Patient is open to taking a statin medication.  -Recommended to continue current medication Recommended Atorvastatin 10 mg tablet. Collaborated with Dr.  Baird Cancer who agreed to have patient start Atorvastatin 10 mg tablet once per day monday through friday. Patient will come back for a lab follow up in the next 8 weeks for a CBC, Chemistry and Lipid panel.     Stage 3 CKD Stage 3A  -Controlled -Current treatment  . Valsartan - Hydrochlorothiazide 320-25 mg tablet daily . Amlodipine 10 mg tablet daily . Bystolic 10 mg tablet daily  -Assessed that patients current medications are correctly dosed and I have reviewed to make sure if necessary they are renally dosed.   Glaucoma  (Goal: alleviate signs and symptoms of glaucoma) -Controlled -Current treatment  . Latanoprost 0.005% - place 1 drop into both eyes at bedtime . Dorzolamide - timolol 22.3-6.8 mg/ml - place 1 drop into both eyes two times daily.   o Patient given samples from the optometrist  . Brimonidine (Alphagan) 0.2% ophthalmic solution - place 1 drop into eye two times daily.    -Recommended to continue current medication  BPH (Goal: reduce signs and symptoms) -Controlled -Current treatment  . Tamsulosin 0.4 mg capsule daily  -Recommended to continue current medication   Allergies (Goal: alleviate signs and symptoms of allergies) -Controlled -Current treatment  . Cetirizine 10 mg tablet daily  . Fluticasone 50 mcg/act - inhale 2 sprays into each nostril every night  . Guaifenesin 600 mg - taking 1 tablet by mouth twice per day as needed -Recommended to continue current medication  Health Maintenance -Vaccine gaps: patient eligible for Pneumovax and Prevnar vaccine  -Educated on Educated on the importance of vaccinations.  -Recommend patient be vaccinated.   Patient Goals/Self-Care Activities . Over the next 90 days, patient will:  - take medications as prescribed focus on medication adherence by by using a pill box.   Follow Up Plan: Telephone follow up appointment with care management team member scheduled for: The patient has been provided with contact  information for the care management team and has been advised to call with any health related questions or concerns.  Next PCP appointment  scheduled for:  Next AWV (Annual Wellness Visit) scheduled for: 11/07/2020      Medication Assistance: None required.  Patient affirms current coverage meets needs.  Patient's preferred pharmacy is:  [id number] [place] [id number]: [phone]: [phone] Uses pill box? No - patient prefers to set up his vials Pt endorses 95% compliance  We discussed: Benefits of medication synchronization, packaging and delivery as well as enhanced pharmacist oversight with [employer]. Patient decided to: Continue current medication management strategy  Care Plan and Follow Up Patient Decision:  Patient agrees to Care Plan and Follow-up.  Plan: Telephone follow up appointment with care management team member scheduled for:  01/01/2021 and The patient has been provided with contact information for the care management team and has been advised to call with any health related questions or concerns.   VOrlando Penner PharmD Clinical Pharmacist [facility] [phone]

## 2020-09-11 DIAGNOSIS — H2511 Age-related nuclear cataract, right eye: Secondary | ICD-10-CM | POA: Diagnosis not present

## 2020-09-11 DIAGNOSIS — H401113 Primary open-angle glaucoma, right eye, severe stage: Secondary | ICD-10-CM | POA: Diagnosis not present

## 2020-09-11 DIAGNOSIS — H401122 Primary open-angle glaucoma, left eye, moderate stage: Secondary | ICD-10-CM | POA: Diagnosis not present

## 2020-09-11 DIAGNOSIS — H04123 Dry eye syndrome of bilateral lacrimal glands: Secondary | ICD-10-CM | POA: Diagnosis not present

## 2020-09-11 DIAGNOSIS — Z961 Presence of intraocular lens: Secondary | ICD-10-CM | POA: Diagnosis not present

## 2020-09-11 NOTE — Patient Instructions (Addendum)
Visit Information It was great speaking with you today!  Please let me know if you have any questions about our visit.   Care Plan : [place]  Updates made by Mayford Knife, RPH since 09/11/2020 12:00 AM         Problem: HTN, Atherosclerosis of Aorta, CKD   Priority: High    Long-Range Goal: Disease Management   Start Date: 08/01/2020  Recent Progress: On track  Priority: High  Note:        Current Barriers:  . Unable to achieve control of blood pressure.    Pharmacist Clinical Goal(s):  Marland Kitchen Over the next 30 days, patient will achieve adherence to monitoring guidelines and medication adherence to achieve therapeutic efficacy . maintain control of hypertension as evidenced by blood pressure readings.   through collaboration with PharmD and provider.   Interventions: . 1:1 collaboration with Glendale Chard, MD regarding development and update of comprehensive plan of care as evidenced by provider attestation and co-signature . Inter-disciplinary care team collaboration (see longitudinal plan of care) . Comprehensive medication review performed; medication list updated in electronic medical record  Hypertension (BP goal <130/80) -Uncontrolled -Current treatment: . Amlodipine 10 mg taking it once per day . Bystolic 10 mg taking it once per day  o Now taking Bystolic 5 mg tablet once per day - Changed by cardiologist . Alphonse Guild 160-25 mg taking it once per da o Now taking Valsartan 320-25 mg taking it once per day  - Changed last week by cardiologist  -Current home readings: 148/77, 153/82, 156/87, 135/70, 110/81, 141/78, 154/83,145/84,155/88  -Patient reports that  -Current dietary habits: He eats a lot of vegetables and stays healthy.  -Current exercise habits: He has started exercising more often, he is mowing his yard and moving around more.  -Denies hypotensive/hypertensive symptoms -Educated on BP goals and benefits of medications for  prevention of heart attack, stroke and kidney damage; Daily salt intake goal < 2300 mg; Exercise goal of 150 minutes per week; Importance of home blood pressure monitoring; Proper BP monitoring technique;  -Patient has received a new BP machine from [facility] -Patient reports that his new cardiologist has started back   -Counseled to monitor BP at home twice a day, document, and provide log at future appointments -Recommended to continue current medication  Atherosclerosis of Aorta: (LDL goal < 70) -Uncontrolled -Current treatment: . Aspirin 81 mg tablet daily . Bystolic 10 mg tablet daily . Valsartan-Hydrochlorothiazide 325-25 mg tablet daily  . Amlodipine 10 mg tablet daily  -Medications previously tried: none    -Current dietary patterns: He eats a lot of vegetables and avoids fried, fatty foods.  -Current exercise habits: He does a lot more exercise as it gets warmer. -Patient reports that he is open to taking a cholesterol medication  -Educated on Benefits of statin for ASCVD risk reduction; Importance of limiting foods high in cholesterol; Exercise goal of 150 minutes per week; Patient is open to taking a statin medication.  -Recommended to continue current medication Recommended Atorvastatin 10 mg tablet. Collaborated with Dr. Baird Cancer who agreed to have patient start Atorvastatin 10 mg tablet once per day monday through friday. Patient will come back for a lab follow up in the next 8 weeks for a CBC, Chemistry and Lipid panel.     Stage 3 CKD Stage 3A  -Controlled -Current treatment  . Valsartan - Hydrochlorothiazide 320-25 mg tablet daily . Amlodipine 10 mg tablet daily . Bystolic 10 mg tablet  daily  -Assessed that patients current medications are correctly dosed and I have reviewed to make sure if necessary they are renally dosed.   Glaucoma  (Goal: alleviate signs and symptoms of glaucoma) -Controlled -Current treatment  . Latanoprost 0.005% - place 1 drop  into both eyes at bedtime . Dorzolamide - timolol 22.3-6.8 mg/ml - place 1 drop into both eyes two times daily.   o Patient given samples from the optometrist  . Brimonidine (Alphagan) 0.2% ophthalmic solution - place 1 drop into eye two times daily.    -Recommended to continue current medication  BPH (Goal: reduce signs and symptoms) -Controlled -Current treatment  . Tamsulosin 0.4 mg capsule daily  -Recommended to continue current medication   Allergies (Goal: alleviate signs and symptoms of allergies) -Controlled -Current treatment  . Cetirizine 10 mg tablet daily  . Fluticasone 50 mcg/act - inhale 2 sprays into each nostril every night  . Guaifenesin 600 mg - taking 1 tablet by mouth twice per day as needed -Recommended to continue current medication  Health Maintenance -Vaccine gaps: patient eligible for Pneumovax and Prevnar vaccine  -Educated on Educated on the importance of vaccinations.  -Recommend patient be vaccinated.   Patient Goals/Self-Care Activities . Over the next 90 days, patient will:  - take medications as prescribed focus on medication adherence by by using a pill box.   Follow Up Plan: Telephone follow up appointment with care management team member scheduled for: The patient has been provided with contact information for the care management team and has been advised to call with any health related questions or concerns.  Next AWV (Annual Wellness Visit) scheduled for: 11/07/2020  Patient agreed to services and verbal consent obtained.   The patient verbalized understanding of instructions, educational materials, and care plan provided today and agreed to receive a mailed copy of patient instructions, educational materials, and care plan.   Orlando Penner, PharmD Clinical Pharmacist [facility] [phone]

## 2020-09-12 ENCOUNTER — Ambulatory Visit (INDEPENDENT_AMBULATORY_CARE_PROVIDER_SITE_OTHER): Payer: Medicare Other | Admitting: Nurse Practitioner

## 2020-09-12 ENCOUNTER — Other Ambulatory Visit: Payer: Self-pay

## 2020-09-12 VITALS — BP 122/72 | HR 55 | Temp 97.8°F | Ht 68.8 in | Wt 191.0 lb

## 2020-09-12 DIAGNOSIS — I700 Atherosclerosis of aorta: Secondary | ICD-10-CM

## 2020-09-12 NOTE — Patient Instructions (Signed)

## 2020-09-12 NOTE — Progress Notes (Signed)
This visit occurred during the SARS-CoV-2 public health emergency.  Safety protocols were in place, including screening questions prior to the visit, additional usage of staff PPE, and extensive cleaning of exam room while observing appropriate contact time as indicated for disinfecting solutions.  Subjective:     Patient ID: Lawrence Bailey , male    DOB: July 27, 1940 , 80 y.o.   MRN: [id number]   Chief Complaint  Patient presents with  . Hypertension    HPI  Patient is here and would like to get a new referral to a cardiologist as he is not happy with them.  He sees doctor Harwani, and states that he always says the same thing and does not agree with his treatment plan.  No other concerns today. He has arteriosclerotic cardiovascular disease.  He is also seeing a kidney specialist Dr. Johnney Ou      Past Medical History:  Diagnosis Date  . BPH (benign prostatic hyperplasia)   . Coronary artery disease   . GIST (gastrointestinal stromal tumor), malignant ([place]) dx'd 04/2007   gleevac comp 04/2008  . Glaucoma   . Hypertension   . Prostate cancer ([place])   . Renal insufficiency      Family History  Problem Relation Age of Onset  . Hypertension Mother   . Cancer Mother   . Breast cancer Neg Hx   . Prostate cancer Neg Hx   . Colon cancer Neg Hx   . Pancreatic cancer Neg Hx      Current Outpatient Medications:  .  amLODipine (NORVASC) 10 MG tablet, Take 10 mg by mouth daily., Disp: , Rfl: 3 .  aspirin 81 MG tablet, Take 1 tablet (81 mg total) by mouth daily., Disp: 30 tablet, Rfl:  .  brimonidine (ALPHAGAN) 0.2 % ophthalmic solution, 1 drop 2 (two) times daily., Disp: , Rfl:  .  BYSTOLIC 10 MG tablet, Take 10 mg by mouth daily., Disp: , Rfl:  .  cetirizine (ZYRTEC) 10 MG tablet, Take 10 mg by mouth daily as needed for allergies., Disp: , Rfl:  .  docusate sodium (COLACE) 100 MG capsule, Take 100 mg by mouth 2 (two) times daily., Disp: , Rfl:  .  dorzolamide-timolol (COSOPT)  22.3-6.8 MG/ML ophthalmic solution, Place 1 drop into both eyes 2 (two) times daily., Disp: , Rfl: 0 .  fluticasone (FLONASE) 50 MCG/ACT nasal spray, INHALE 2 SPRAYS INTO EACH NOSTRIL EVERY NIGHT, Disp: 16 mL, Rfl: 5 .  guaiFENesin (MUCINEX) 600 MG 12 hr tablet, Take by mouth 2 (two) times daily., Disp: , Rfl:  .  latanoprost (XALATAN) 0.005 % ophthalmic solution, Place 1 drop into both eyes at bedtime., Disp: , Rfl:  .  pantoprazole (PROTONIX) 40 MG tablet, TAKE 1 TABLET BY MOUTH EVERY DAY, Disp: 90 tablet, Rfl: 1 .  sildenafil (REVATIO) 20 MG tablet, Take 20 mg by mouth 3 (three) times daily., Disp: , Rfl:  .  tamsulosin (FLOMAX) 0.4 MG CAPS capsule, Take 0.4 mg by mouth., Disp: , Rfl:  .  valsartan-hydrochlorothiazide (DIOVAN-HCT) 160-25 MG tablet, TAKE 1 TABLET BY MOUTH EVERY DAY, Disp: 90 tablet, Rfl: 1   Allergies  Allergen Reactions  . Other     Bee sting   . Sucralfate Other (See Comments)     Review of Systems  Constitutional: Negative for chills and fever.  HENT: Negative for congestion and ear pain.   Respiratory: Negative for cough, shortness of breath and wheezing.   Cardiovascular: Negative for chest pain and palpitations.  Gastrointestinal: Negative for constipation and diarrhea.  Musculoskeletal: Negative for arthralgias and myalgias.  All other systems reviewed and are negative.    Today's Vitals   09/12/20 1020  BP: 122/72  Pulse: (!) 55  Temp: 97.8 F (36.6 C)  TempSrc: Oral  Weight: 191 lb (86.6 kg)  Height: 5' 8.8" (1.748 m)   Body mass index is 28.37 kg/m.   Objective:  Physical Exam Constitutional:      Appearance: Normal appearance.  HENT:     Head: Normocephalic and atraumatic.  Cardiovascular:     Rate and Rhythm: Normal rate and regular rhythm.     Pulses: Normal pulses.     Heart sounds: Normal heart sounds. No murmur heard.   Pulmonary:     Effort: Pulmonary effort is normal. No respiratory distress.     Breath sounds: Normal breath  sounds.  Abdominal:     General: Bowel sounds are normal.  Skin:    General: Skin is warm and dry.     Capillary Refill: Capillary refill takes less than 2 seconds.  Neurological:     General: No focal deficit present.     Mental Status: He is alert and oriented to person, place, and time.         Assessment And Plan:     1. Atherosclerosis of aorta [facility]) -Patient is currently being followed by Dr. Lucio Edward but would like to change his cardiologist due to him not agreeing with his treatment plan. He has a history of HTN and is complaint with his medication. No other concerns expressed today.  - Ambulatory referral to Cardiology sent     Patient was given opportunity to ask questions. Patient verbalized understanding of the plan and was able to repeat key elements of the plan. All questions were answered to their satisfaction.  Bary Castilla, DNP   I, Bary Castilla, DNP  have reviewed all documentation for this visit. The documentation on 09/12/20  for the exam, diagnosis, procedures, and orders are all accurate and complete.       THE PATIENT IS ENCOURAGED TO PRACTICE SOCIAL DISTANCING DUE TO THE COVID-19 PANDEMIC.

## 2020-09-27 ENCOUNTER — Telehealth: Payer: Self-pay

## 2020-09-27 NOTE — Chronic Care Management (AMB) (Signed)
Chronic Care Management Pharmacy Assistant   Name: Lawrence Bailey  MRN: [id number] DOB: [birth year]   Reason for Encounter: Disease State/ Hypertension   Recent office visits:  09-12-2020 Bary Castilla, NP (PCP)   Recent consult visits:  None  Hospital visits:  None in previous 6 months  Medications: Outpatient Encounter Medications as of 09/27/2020  Medication Sig Note  . amLODipine (NORVASC) 10 MG tablet Take 10 mg by mouth daily.   Marland Kitchen aspirin 81 MG tablet Take 1 tablet (81 mg total) by mouth daily.   . brimonidine (ALPHAGAN) 0.2 % ophthalmic solution 1 drop 2 (two) times daily.   Marland Kitchen BYSTOLIC 10 MG tablet Take 10 mg by mouth daily.   . cetirizine (ZYRTEC) 10 MG tablet Take 10 mg by mouth daily as needed for allergies.   Marland Kitchen docusate sodium (COLACE) 100 MG capsule Take 100 mg by mouth 2 (two) times daily.   . dorzolamide-timolol (COSOPT) 22.3-6.8 MG/ML ophthalmic solution Place 1 drop into both eyes 2 (two) times daily.   . fluticasone (FLONASE) 50 MCG/ACT nasal spray INHALE 2 SPRAYS INTO EACH NOSTRIL EVERY NIGHT   . guaiFENesin (MUCINEX) 600 MG 12 hr tablet Take by mouth 2 (two) times daily.   Marland Kitchen latanoprost (XALATAN) 0.005 % ophthalmic solution Place 1 drop into both eyes at bedtime.   . pantoprazole (PROTONIX) 40 MG tablet TAKE 1 TABLET BY MOUTH EVERY DAY   . sildenafil (REVATIO) 20 MG tablet Take 20 mg by mouth 3 (three) times daily. 05/24/2018: Reports only uses when plans to be sexually active. Smith,Kimberly N, RN 05/24/18 1:49 PM      . tamsulosin (FLOMAX) 0.4 MG CAPS capsule Take 0.4 mg by mouth.   . valsartan-hydrochlorothiazide (DIOVAN-HCT) 160-25 MG tablet TAKE 1 TABLET BY MOUTH EVERY DAY    No facility-administered encounter medications on file as of 09/27/2020.   Reviewed chart prior to disease state call. Spoke with patient regarding BP  Recent Office Vitals: BP Readings from Last 3 Encounters:  09/12/20 122/72  07/30/20 (!) 142/73  07/09/20 134/66    Pulse Readings from Last 3 Encounters:  09/12/20 (!) 55  07/30/20 (!) 57  07/09/20 65    Wt Readings from Last 3 Encounters:  09/12/20 191 lb (86.6 kg)  07/30/20 189 lb 12.8 oz (86.1 kg)  07/09/20 191 lb (86.6 kg)     Kidney Function Lab Results  Component Value Date/Time   CREATININE 1.44 (H) 07/30/2020 09:21 AM   CREATININE 1.26 07/09/2020 03:30 PM   CREATININE 1.86 (H) 05/28/2020 12:03 PM   CREATININE 1.29 (H) 02/01/2020 09:24 AM   CREATININE 1.5 (H) 04/10/2014 09:46 AM   CREATININE 1.8 (H) 04/14/2013 09:14 AM   GFRNONAA 49 (L) 07/30/2020 09:21 AM   GFRAA 62 07/09/2020 03:30 PM   GFRAA >60 02/01/2020 09:24 AM    BMP Latest Ref Rng & Units 07/30/2020 07/09/2020 05/28/2020  Glucose 70 - 99 mg/dL 110(H) 103(H) 99  BUN 8 - 23 mg/dL 22 26 26   Creatinine 0.61 - 1.24 mg/dL 1.44(H) 1.26 1.86(H)  BUN/Creat Ratio 10 - 24 - 21 14  Sodium 135 - 145 mmol/L 137 141 140  Potassium 3.5 - 5.1 mmol/L 3.4(L) 4.4 3.9  Chloride 98 - 111 mmol/L 103 102 101  CO2 22 - 32 mmol/L 29 24 25   Calcium 8.9 - 10.3 mg/dL 9.2 9.6 9.5    . Current antihypertensive regimen:   Amlodipine 10 mg tablet daily  Bystolic 10 mg tablet daily   Valsartan -  Hydrochlorothiazide 160-25 mg take 1 tablet daily   . How often are you checking your Blood Pressure? daily   . Current home BP readings: Patient states on 09-26-20 130/77, on 09-25-20 136/76  . What recent interventions/DTPs have been made by any provider to improve Blood Pressure control since last CPP Visit: Patient states he is taking medications as prescribed.   . Any recent hospitalizations or ED visits since last visit with CPP? No   . What diet changes have been made to improve Blood Pressure Control?  o Patient states he's eliminated salty food intake, uses the air fryer and doesn't eat out a lot.  . What exercise is being done to improve your Blood Pressure Control?  o Patient states he mows the  [place] and is very active during the  day.  Adherence Review: Is the patient currently on ACE/ARB medication? Yes Does the patient have >5 day gap between last estimated fill dates? No   Star Rating Drugs: Valsartan-HTCZ 320-25mg - Last filled 08-30-2020 [facility]   [place]  [phone]

## 2020-10-11 DIAGNOSIS — R0609 Other forms of dyspnea: Secondary | ICD-10-CM | POA: Diagnosis not present

## 2020-10-11 DIAGNOSIS — I639 Cerebral infarction, unspecified: Secondary | ICD-10-CM | POA: Diagnosis not present

## 2020-10-11 DIAGNOSIS — I2510 Atherosclerotic heart disease of native coronary artery without angina pectoris: Secondary | ICD-10-CM | POA: Diagnosis not present

## 2020-10-14 ENCOUNTER — Telehealth: Payer: Self-pay

## 2020-10-14 MED ORDER — ATORVASTATIN CALCIUM 10 MG PO TABS: ORAL_TABLET | ORAL | 1 refills | Status: DC

## 2020-10-14 NOTE — Telephone Encounter (Signed)
-----   Message from Glendale Chard, MD sent at 10/12/2020  8:00 PM EDT ----- Regarding: FW: statin interest Please notify pt - we are starting atorvastatin M-F skip weekends. Please send 10mg  dose, take M-F, skip weekends #25/1 refill. See me in six weeks for chol check ----- Message ----- From: Mayford Knife, [facility] Sent: 09/11/2020   2:15 PM EDT To: Glendale Chard, MD Subject: statin interest                                Hi Dr. Baird Cancer, Mr. Mccamish reports that he is open to starting a statin I was thinking atorvastatin 10 mg tablet every other day. Just a friendly reminder because we discussed it but I should have followed up in writing as you requested.   Thank You,  Orlando Penner, PharmD Clinical Pharmacist [facility] [phone]

## 2020-11-01 DIAGNOSIS — I10 Essential (primary) hypertension: Secondary | ICD-10-CM | POA: Diagnosis not present

## 2020-11-01 DIAGNOSIS — E785 Hyperlipidemia, unspecified: Secondary | ICD-10-CM | POA: Diagnosis not present

## 2020-11-01 DIAGNOSIS — I2510 Atherosclerotic heart disease of native coronary artery without angina pectoris: Secondary | ICD-10-CM | POA: Diagnosis not present

## 2020-11-01 DIAGNOSIS — I5030 Unspecified diastolic (congestive) heart failure: Secondary | ICD-10-CM | POA: Diagnosis not present

## 2020-11-07 ENCOUNTER — Ambulatory Visit: Payer: Medicare Other | Admitting: Internal Medicine

## 2020-11-07 ENCOUNTER — Encounter: Payer: Medicare Other | Admitting: Internal Medicine

## 2020-11-07 ENCOUNTER — Other Ambulatory Visit: Payer: Self-pay | Admitting: Internal Medicine

## 2020-11-07 ENCOUNTER — Ambulatory Visit: Payer: Medicare Other

## 2020-11-12 ENCOUNTER — Telehealth: Payer: Self-pay

## 2020-11-12 NOTE — Chronic Care Management (AMB) (Signed)
Chronic Care Management Pharmacy Assistant   Name: Lawrence Bailey  MRN: [id number] DOB: 04-16-1941   Reason for Encounter: Disease State/ Hypertension   Recent office visits:  None  Recent consult visits:  None  Hospital visits:  None in previous 6 months  Medications: Outpatient Encounter Medications as of 11/12/2020  Medication Sig Note   amLODipine (NORVASC) 10 MG tablet Take 10 mg by mouth daily.    aspirin 81 MG tablet Take 1 tablet (81 mg total) by mouth daily.    atorvastatin (LIPITOR) 10 MG tablet TAKE 1 TABLET BY MOUTH MONDAY - FRIDAY AT BEDTIME    brimonidine (ALPHAGAN) 0.2 % ophthalmic solution 1 drop 2 (two) times daily.    BYSTOLIC 10 MG tablet Take 10 mg by mouth daily.    cetirizine (ZYRTEC) 10 MG tablet Take 10 mg by mouth daily as needed for allergies.    docusate sodium (COLACE) 100 MG capsule Take 100 mg by mouth 2 (two) times daily.    dorzolamide-timolol (COSOPT) 22.3-6.8 MG/ML ophthalmic solution Place 1 drop into both eyes 2 (two) times daily.    fluticasone (FLONASE) 50 MCG/ACT nasal spray INHALE 2 SPRAYS INTO EACH NOSTRIL EVERY NIGHT    guaiFENesin (MUCINEX) 600 MG 12 hr tablet Take by mouth 2 (two) times daily.    latanoprost (XALATAN) 0.005 % ophthalmic solution Place 1 drop into both eyes at bedtime.    pantoprazole (PROTONIX) 40 MG tablet TAKE 1 TABLET BY MOUTH EVERY DAY    sildenafil (REVATIO) 20 MG tablet Take 20 mg by mouth 3 (three) times daily. 05/24/2018: Reports only uses when plans to be sexually active. Wilmon Arms, RN 05/24/18 1:49 PM       tamsulosin (FLOMAX) 0.4 MG CAPS capsule Take 0.4 mg by mouth.    valsartan-hydrochlorothiazide (DIOVAN-HCT) 160-25 MG tablet TAKE 1 TABLET BY MOUTH EVERY DAY    No facility-administered encounter medications on file as of 11/12/2020.   Reviewed chart prior to disease state call. Spoke with patient regarding BP  Recent Office Vitals: BP Readings from Last 3 Encounters:  09/12/20 122/72   07/30/20 (!) 142/73  07/09/20 134/66   Pulse Readings from Last 3 Encounters:  09/12/20 (!) 55  07/30/20 (!) 57  07/09/20 65    Wt Readings from Last 3 Encounters:  09/12/20 191 lb (86.6 kg)  07/30/20 189 lb 12.8 oz (86.1 kg)  07/09/20 191 lb (86.6 kg)     Kidney Function Lab Results  Component Value Date/Time   CREATININE 1.44 (H) 07/30/2020 09:21 AM   CREATININE 1.26 07/09/2020 03:30 PM   CREATININE 1.86 (H) 05/28/2020 12:03 PM   CREATININE 1.29 (H) 02/01/2020 09:24 AM   CREATININE 1.5 (H) 04/10/2014 09:46 AM   CREATININE 1.8 (H) 04/14/2013 09:14 AM   GFRNONAA 49 (L) 07/30/2020 09:21 AM   GFRAA 62 07/09/2020 03:30 PM   GFRAA >60 02/01/2020 09:24 AM    BMP Latest Ref Rng & Units 07/30/2020 07/09/2020 05/28/2020  Glucose 70 - 99 mg/dL 110(H) 103(H) 99  BUN 8 - 23 mg/dL 22 26 26   Creatinine 0.61 - 1.24 mg/dL 1.44(H) 1.26 1.86(H)  BUN/Creat Ratio 10 - 24 - 21 14  Sodium 135 - 145 mmol/L 137 141 140  Potassium 3.5 - 5.1 mmol/L 3.4(L) 4.4 3.9  Chloride 98 - 111 mmol/L 103 102 101  CO2 22 - 32 mmol/L 29 24 25   Calcium 8.9 - 10.3 mg/dL 9.2 9.6 9.5    Current antihypertensive regimen:  Amlodipine 10 mg  daily Bystolic 10 mg daily Valsartan - Hydrochlorothiazide 320-25 mg daily   How often are you checking your Blood Pressure? 3-5x per week  Current home BP readings: Patient states BP is managed and has been normal  What recent interventions/DTPs have been made by any provider to improve Blood Pressure control since last CPP Visit: CPP educated patient on BP goals and benefits of medications for prevention of heart attack, stroke and kidney damage. Daily salt intake goal < 2300 mg, exercise goal of 150 minutes per week and the importance of home blood pressure monitoring.  Any recent hospitalizations or ED visits since last visit with CPP? No  What diet changes have been made to improve Blood Pressure Control?  Patient states he's eliminated salty food intake, uses the air  fryer and doesn't eat out a lot.  What exercise is being done to improve your Blood Pressure Control?  Patient states he mows the lawn, walks and is very active during the day.  Adherence Review: Is the patient currently on ACE/ARB medication? Yes Does the patient have >5 day gap between last estimated fill dates? No   Star Rating Drugs: Atorvastatin 10 mg- Last filled 10-14-2020 [facility] Valsartan-HTCZ 320-25 mg- Last filled 08-30-2020 [facility]  [place] Clinical Pharmacist Assistant [phone]

## 2020-11-13 ENCOUNTER — Ambulatory Visit (INDEPENDENT_AMBULATORY_CARE_PROVIDER_SITE_OTHER): Payer: Medicare Other

## 2020-11-13 VITALS — BP 140/62 | HR 51 | Temp 97.7°F | Ht 69.2 in | Wt 188.6 lb

## 2020-11-13 DIAGNOSIS — Z23 Encounter for immunization: Secondary | ICD-10-CM | POA: Diagnosis not present

## 2020-11-13 DIAGNOSIS — Z0000 Encounter for general adult medical examination without abnormal findings: Secondary | ICD-10-CM

## 2020-11-13 MED ORDER — PREVNAR 20 0.5 ML IM SUSY: 0.5000 mL | PREFILLED_SYRINGE | INTRAMUSCULAR | 0 refills | Status: AC

## 2020-11-13 NOTE — Progress Notes (Signed)
This visit occurred during the SARS-CoV-2 public health emergency.  Safety protocols were in place, including screening questions prior to the visit, additional usage of staff PPE, and extensive cleaning of exam room while observing appropriate contact time as indicated for disinfecting solutions.  Subjective:   Lawrence Bailey is a 80 y.o. male who presents for Medicare Annual/Subsequent preventive examination.  Review of Systems     Cardiac Risk Factors include: advanced age (>66men, >51 women);hypertension;male gender     Objective:    Today's Vitals   11/13/20 1405 11/13/20 1430  BP: (!) 142/64 140/62  Pulse: (!) 51   Temp: 97.7 F (36.5 C)   TempSrc: Oral   SpO2: 97%   Weight: 188 lb 9.6 oz (85.5 kg)   Height: 5' 9.2" (1.758 m)    Body mass index is 27.69 kg/m.  Advanced Directives 11/13/2020 07/25/2020 11/01/2019 07/11/2019 05/25/2019 10/25/2018 11/08/2016  Does Patient Have a Medical Advance Directive? No No No No No No No  Would patient like information on creating a medical advance directive? No - Patient declined Yes (MAU/Ambulatory/Procedural Areas - Information given) Yes (MAU/Ambulatory/Procedural Areas - Information given) Yes (MAU/Ambulatory/Procedural Areas - Information given) No - Patient declined - No - Patient declined  Pre-existing out of facility DNR order (yellow form or pink MOST form) - - - - - - -    Current Medications (verified) Outpatient Encounter Medications as of 11/13/2020  Medication Sig   amLODipine (NORVASC) 10 MG tablet Take 10 mg by mouth daily.   aspirin 81 MG tablet Take 1 tablet (81 mg total) by mouth daily.   atorvastatin (LIPITOR) 10 MG tablet TAKE 1 TABLET BY MOUTH MONDAY - FRIDAY AT BEDTIME   brimonidine (ALPHAGAN) 0.2 % ophthalmic solution 1 drop 2 (two) times daily.   BYSTOLIC 10 MG tablet Take 10 mg by mouth daily.   cetirizine (ZYRTEC) 10 MG tablet Take 10 mg by mouth daily as needed for allergies.   docusate sodium (COLACE) 100 MG  capsule Take 100 mg by mouth 2 (two) times daily.   dorzolamide-timolol (COSOPT) 22.3-6.8 MG/ML ophthalmic solution Place 1 drop into both eyes 2 (two) times daily.   fluticasone (FLONASE) 50 MCG/ACT nasal spray INHALE 2 SPRAYS INTO EACH NOSTRIL EVERY NIGHT   guaiFENesin (MUCINEX) 600 MG 12 hr tablet Take by mouth 2 (two) times daily.   latanoprost (XALATAN) 0.005 % ophthalmic solution Place 1 drop into both eyes at bedtime.   pantoprazole (PROTONIX) 40 MG tablet TAKE 1 TABLET BY MOUTH EVERY DAY   pneumococcal 20-Val Conj Vacc (PREVNAR 20) 0.5 ML SUSY Inject 0.5 mLs into the muscle tomorrow at 10 am for 1 dose.   sildenafil (REVATIO) 20 MG tablet Take 20 mg by mouth 3 (three) times daily.   tamsulosin (FLOMAX) 0.4 MG CAPS capsule Take 0.4 mg by mouth.   valsartan-hydrochlorothiazide (DIOVAN-HCT) 160-25 MG tablet TAKE 1 TABLET BY MOUTH EVERY DAY   No facility-administered encounter medications on file as of 11/13/2020.    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   gleevac comp 04/2008   Glaucoma    Hypertension    Prostate cancer ([place])    Renal insufficiency    Past Surgical History:  Procedure Laterality Date   ABDOMINAL SURGERY     EYE SURGERY     HERNIA REPAIR  KNEE SURGERY     Family History  Problem Relation Age of Onset   Hypertension Mother    Cancer Mother    Breast cancer Neg Hx    Prostate cancer Neg Hx    Colon cancer Neg Hx    Pancreatic cancer Neg Hx    Social History   Socioeconomic History   Marital status: Married    Spouse name: Not on file   Number of children: Not on file   Years of education: Not on file   Highest education level: Not on file  Occupational History   Occupation: retired  Tobacco Use   Smoking status: Never   Smokeless tobacco: Never  Vaping  Use   Vaping Use: Never used  Substance and Sexual Activity   Alcohol use: Not Currently   Drug use: No   Sexual activity: Yes  Other Topics Concern   Not on file  Social History Narrative   Not on file   Social Determinants of Health   Financial Resource Strain: Low Risk    Difficulty of Paying Living Expenses: Not hard at all  Food Insecurity: No Food Insecurity   Worried About Charity fundraiser in the Last Year: Never true   [place] in the Last Year: Never true  Transportation Needs: No Transportation Needs   Lack of Transportation (Medical): No   Lack of Transportation (Non-Medical): No  Physical Activity: Sufficiently Active   Days of Exercise per Week: 6 days   Minutes of Exercise per Session: 60 min  Stress: No Stress Concern Present   Feeling of Stress : Not at all  Social Connections: Not on file    Tobacco Counseling Counseling given: Not Answered   Clinical Intake:  Pre-visit preparation completed: Yes  Pain : No/denies pain     Nutritional Status: BMI 25 -29 Overweight Nutritional Risks: None Diabetes: No  How often do you need to have someone help you when you read instructions, pamphlets, or other written materials from your doctor or pharmacy?: 1 - Never What is the last grade level you completed in school?: 11th grade  Diabetic? no  Interpreter Needed?: No  Information entered by :: NAllen LPN   Activities of Daily Living In your present state of health, do you have any difficulty performing the following activities: 11/13/2020  Hearing? Y  Vision? N  Difficulty concentrating or making decisions? N  Walking or climbing stairs? N  Dressing or bathing? N  Doing errands, shopping? N  Preparing Food and eating ? N  Using the Toilet? N  In the past six months, have you accidently leaked urine? Y  Do you have problems with loss of bowel control? N  Managing your Medications? N  Managing your Finances? N  Housekeeping or managing  your Housekeeping? N  Some recent data might be hidden    Patient Care Team: Glendale Chard, MD as PCP - General (Internal Medicine) Johnathan Hausen, MD as Consulting Physician (General Surgery) Carol Ada, MD as Consulting Physician (Gastroenterology) Charolette Forward, MD as Consulting Physician (Cardiology) Warden Fillers, MD as Consulting Physician (Ophthalmology) Daneen Schick as Social Worker [place], Claudette Stapler, RN as [place], Sharyn Blitz, [facility] (Pharmacist)  Indicate any recent Medical Services you may have received from other than [facility] providers in the past year (date may be approximate).     Assessment:   This is a routine wellness examination for Lawrence Bailey.  Hearing/Vision screen Vision Screening - Comments:: Regular  eye exams, Dr. Katy Fitch  Dietary issues and exercise activities discussed: Current Exercise Habits: Home exercise routine, Type of exercise: walking, Time (Minutes): 60, Frequency (Times/Week): 6, Weekly Exercise (Minutes/Week): 360   Goals Addressed             This Visit's Progress    Patient Stated       11/13/2020, try to get in shape        Depression Screen PHQ 2/9 Scores 11/13/2020 11/01/2019 11/01/2019 10/25/2018 06/15/2018 03/21/2018  PHQ - 2 Score 0 0 0 0 0 0  PHQ- 9 Score - 0 - 0 - -    Fall Risk Fall Risk  11/13/2020 11/01/2019 11/01/2019 10/25/2018 06/15/2018  Falls in the past year? 0 0 0 0 0  Risk for fall due to : Medication side effect Medication side effect - Medication side effect -  Follow up Falls evaluation completed;Education provided;Falls prevention discussed Falls evaluation completed;Education provided;Falls prevention discussed - Falls prevention discussed -    FALL RISK PREVENTION PERTAINING TO THE HOME:  Any stairs in or around the home? Yes  If so, are there any without handrails? No  Home free of loose throw rugs in walkways, pet beds, electrical cords, etc? Yes  Adequate lighting in your  home to reduce risk of falls? Yes   ASSISTIVE DEVICES UTILIZED TO PREVENT FALLS:  Life alert? No  Use of a cane, walker or w/c? No  Grab bars in the bathroom? No  Shower chair or bench in shower? No  Elevated toilet seat or a handicapped toilet? Yes   TIMED UP AND GO:  Was the test performed? No .       Cognitive Function:     6CIT Screen 11/13/2020 11/01/2019 10/25/2018  What Year? 0 points 0 points 0 points  What month? 0 points 0 points 0 points  What time? 0 points 0 points 0 points  Count back from 20 0 points 2 points 0 points  Months in reverse 4 points 0 points 2 points  Repeat phrase 4 points 0 points 0 points  Total Score 8 2 2     Immunizations Immunization History  Administered Date(s) Administered   Fluad Quad(high Dose 65+) 03/15/2019, 07/09/2020   PFIZER(Purple Top)SARS-COV-2 Vaccination 07/01/2019, 07/24/2019, 05/24/2020, 09/22/2020   Tdap 03/15/2019    TDAP status: Up to date  Flu Vaccine status: Up to date  Pneumococcal vaccine status: Due, Education has been provided regarding the importance of this vaccine. Advised may receive this vaccine at local pharmacy or Health Dept. Aware to provide a copy of the vaccination record if obtained from local pharmacy or Health Dept. Verbalized acceptance and understanding.  Covid-19 vaccine status: Completed vaccines  Qualifies for Shingles Vaccine? Yes   Zostavax completed No   Shingrix Completed?: No.    Education has been provided regarding the importance of this vaccine. Patient has been advised to call insurance company to determine out of pocket expense if they have not yet received this vaccine. Advised may also receive vaccine at local pharmacy or Health Dept. Verbalized acceptance and understanding.  Screening Tests Health Maintenance  Topic Date Due   Zoster Vaccines- Shingrix (1 of 2) Never done   PNA vac Low Risk Adult (1 of 2 - PCV13) Never done   INFLUENZA VACCINE  12/23/2020   COVID-19 Vaccine (5  - Booster for Pfizer series) 01/23/2021   TETANUS/TDAP  03/14/2029   HPV VACCINES  Aged Out    Health Maintenance  Health Maintenance Due  Topic Date Due   Zoster Vaccines- Shingrix (1 of 2) Never done   PNA vac Low Risk Adult (1 of 2 - PCV13) Never done    Colorectal cancer screening: No longer required.   Lung Cancer Screening: (Low Dose CT Chest recommended if Age 61-80 years, 30 pack-year currently smoking OR have quit w/in 15years.) does not qualify.   Lung Cancer Screening Referral: no  Additional Screening:  Hepatitis C Screening: does not qualify;   Vision Screening: Recommended annual ophthalmology exams for early detection of glaucoma and other disorders of the eye. Is the patient up to date with their annual eye exam?  Yes  Who is the provider or what is the name of the office in which the patient attends annual eye exams? Dr. Katy Fitch If pt is not established with a provider, would they like to be referred to a provider to establish care? No .   Dental Screening: Recommended annual dental exams for proper oral hygiene  Community Resource Referral / Chronic Care Management: CRR required this visit?  No   CCM required this visit?  No      Plan:     I have personally reviewed and noted the following in the patient's chart:   Medical and social history Use of alcohol, tobacco or illicit drugs  Current medications and supplements including opioid prescriptions. Patient is not currently taking opioid prescriptions. Functional ability and status Nutritional status Physical activity Advanced directives List of other physicians Hospitalizations, surgeries, and ER visits in previous 12 months Vitals Screenings to include cognitive, depression, and falls Referrals and appointments  In addition, I have reviewed and discussed with patient certain preventive protocols, quality metrics, and best practice recommendations. A written personalized care plan for preventive  services as well as general preventive health recommendations were provided to patient.     Kellie Simmering, LPN   [id number]   Nurse Notes:

## 2020-11-13 NOTE — Patient Instructions (Signed)
Lawrence Bailey , Thank you for taking time to come for your Medicare Wellness Visit. I appreciate your ongoing commitment to your health goals. Please review the following plan we discussed and let me know if I can assist you in the future.   Screening recommendations/referrals: Colonoscopy: not required Recommended yearly ophthalmology/optometry visit for glaucoma screening and checkup Recommended yearly dental visit for hygiene and checkup  Vaccinations: Influenza vaccine: completed 07/09/2020, due 12/23/2020 Pneumococcal vaccine: sent to pharmacy Tdap vaccine: completed 03/15/2019, due 03/14/2029 Shingles vaccine: discussed   Covid-19:  09/22/2020, 05/24/2020, 07/24/2019, 07/01/2019  Advanced directives: Advance directive discussed with you today. Even though you declined this today please call our office should you change your mind and we can give you the proper paperwork for you to fill out.  Conditions/risks identified: none  Next appointment: Follow up in one year for your annual wellness visit.   Preventive Care 81 Years and Older, Male Preventive care refers to lifestyle choices and visits with your health care provider that can promote health and wellness. What does preventive care include? A yearly physical exam. This is also called an annual well check. Dental exams once or twice a year. Routine eye exams. Ask your health care provider how often you should have your eyes checked. Personal lifestyle choices, including: Daily care of your teeth and gums. Regular physical activity. Eating a healthy diet. Avoiding tobacco and drug use. Limiting alcohol use. Practicing safe sex. Taking low doses of aspirin every day. Taking vitamin and mineral supplements as recommended by your health care provider. What happens during an annual well check? The services and screenings done by your health care provider during your annual well check will depend on your age, overall health, lifestyle  risk factors, and family history of disease. Counseling  Your health care provider may ask you questions about your: Alcohol use. Tobacco use. Drug use. Emotional well-being. Home and relationship well-being. Sexual activity. Eating habits. History of falls. Memory and ability to understand (cognition). Work and work Statistician. Screening  You may have the following tests or measurements: Height, weight, and BMI. Blood pressure. Lipid and cholesterol levels. These may be checked every 5 years, or more frequently if you are over 75 years old. Skin check. Lung cancer screening. You may have this screening every year starting at age 68 if you have a 30-pack-year history of smoking and currently smoke or have quit within the past 15 years. Fecal occult blood test (FOBT) of the stool. You may have this test every year starting at age 28. Flexible sigmoidoscopy or colonoscopy. You may have a sigmoidoscopy every 5 years or a colonoscopy every 10 years starting at age 31. Prostate cancer screening. Recommendations will vary depending on your family history and other risks. Hepatitis C blood test. Hepatitis B blood test. Sexually transmitted disease (STD) testing. Diabetes screening. This is done by checking your blood sugar (glucose) after you have not eaten for a while (fasting). You may have this done every 1-3 years. Abdominal aortic aneurysm (AAA) screening. You may need this if you are a current or former smoker. Osteoporosis. You may be screened starting at age 41 if you are at high risk. Talk with your health care provider about your test results, treatment options, and if necessary, the need for more tests. Vaccines  Your health care provider may recommend certain vaccines, such as: Influenza vaccine. This is recommended every year. Tetanus, diphtheria, and acellular pertussis (Tdap, Td) vaccine. You may need a Td booster every  10 years. Zoster vaccine. You may need this after age  23. Pneumococcal 13-valent conjugate (PCV13) vaccine. One dose is recommended after age 21. Pneumococcal polysaccharide (PPSV23) vaccine. One dose is recommended after age 51. Talk to your health care provider about which screenings and vaccines you need and how often you need them. This information is not intended to replace advice given to you by your health care provider. Make sure you discuss any questions you have with your health care provider. Document Released: 06/07/2015 Document Revised: 01/29/2016 Document Reviewed: 03/12/2015 Elsevier Interactive Patient Education  2017 [place] Prevention in the Home Falls can cause injuries. They can happen to people of all ages. There are many things you can do to make your home safe and to help prevent falls. What can I do on the outside of my home? Regularly fix the edges of walkways and driveways and fix any cracks. Remove anything that might make you trip as you walk through a door, such as a raised step or threshold. Trim any bushes or trees on the path to your home. Use bright outdoor lighting. Clear any walking paths of anything that might make someone trip, such as rocks or tools. Regularly check to see if handrails are loose or broken. Make sure that both sides of any steps have handrails. Any raised decks and porches should have guardrails on the edges. Have any leaves, snow, or ice cleared regularly. Use sand or salt on walking paths during winter. Clean up any spills in your garage right away. This includes oil or grease spills. What can I do in the bathroom? Use night lights. Install grab bars by the toilet and in the tub and shower. Do not use towel bars as grab bars. Use non-skid mats or decals in the tub or shower. If you need to sit down in the shower, use a plastic, non-slip stool. Keep the floor dry. Clean up any water that spills on the floor as soon as it happens. Remove soap buildup in the tub or shower  regularly. Attach bath mats securely with double-sided non-slip rug tape. Do not have throw rugs and other things on the floor that can make you trip. What can I do in the bedroom? Use night lights. Make sure that you have a light by your bed that is easy to reach. Do not use any sheets or blankets that are too big for your bed. They should not hang down onto the floor. Have a firm chair that has side arms. You can use this for support while you get dressed. Do not have throw rugs and other things on the floor that can make you trip. What can I do in the kitchen? Clean up any spills right away. Avoid walking on wet floors. Keep items that you use a lot in easy-to-reach places. If you need to reach something above you, use a strong step stool that has a grab bar. Keep electrical cords out of the way. Do not use floor polish or wax that makes floors slippery. If you must use wax, use non-skid floor wax. Do not have throw rugs and other things on the floor that can make you trip. What can I do with my stairs? Do not leave any items on the stairs. Make sure that there are handrails on both sides of the stairs and use them. Fix handrails that are broken or loose. Make sure that handrails are as long as the stairways. Check any carpeting to make  sure that it is firmly attached to the stairs. Fix any carpet that is loose or worn. Avoid having throw rugs at the top or bottom of the stairs. If you do have throw rugs, attach them to the floor with carpet tape. Make sure that you have a light switch at the top of the stairs and the bottom of the stairs. If you do not have them, ask someone to add them for you. What else can I do to help prevent falls? Wear shoes that: Do not have high heels. Have rubber bottoms. Are comfortable and fit you well. Are closed at the toe. Do not wear sandals. If you use a stepladder: Make sure that it is fully opened. Do not climb a closed stepladder. Make sure that  both sides of the stepladder are locked into place. Ask someone to hold it for you, if possible. Clearly mark and make sure that you can see: Any grab bars or handrails. First and last steps. Where the edge of each step is. Use tools that help you move around (mobility aids) if they are needed. These include: Canes. Walkers. Scooters. Crutches. Turn on the lights when you go into a dark area. Replace any light bulbs as soon as they burn out. Set up your furniture so you have a clear path. Avoid moving your furniture around. If any of your floors are uneven, fix them. If there are any pets around you, be aware of where they are. Review your medicines with your doctor. Some medicines can make you feel dizzy. This can increase your chance of falling. Ask your doctor what other things that you can do to help prevent falls. This information is not intended to replace advice given to you by your health care provider. Make sure you discuss any questions you have with your health care provider. Document Released: 03/07/2009 Document Revised: 10/17/2015 Document Reviewed: 06/15/2014 Elsevier Interactive Patient Education  2017 [employer].

## 2020-11-18 ENCOUNTER — Ambulatory Visit (INDEPENDENT_AMBULATORY_CARE_PROVIDER_SITE_OTHER): Payer: Medicare Other

## 2020-11-18 ENCOUNTER — Telehealth: Payer: Medicare Other

## 2020-11-18 DIAGNOSIS — N1831 Chronic kidney disease, stage 3a: Secondary | ICD-10-CM | POA: Diagnosis not present

## 2020-11-18 DIAGNOSIS — I129 Hypertensive chronic kidney disease with stage 1 through stage 4 chronic kidney disease, or unspecified chronic kidney disease: Secondary | ICD-10-CM | POA: Diagnosis not present

## 2020-11-18 DIAGNOSIS — I700 Atherosclerosis of aorta: Secondary | ICD-10-CM

## 2020-11-18 DIAGNOSIS — I10 Essential (primary) hypertension: Secondary | ICD-10-CM

## 2020-11-18 DIAGNOSIS — D472 Monoclonal gammopathy: Secondary | ICD-10-CM

## 2020-11-18 NOTE — Patient Instructions (Signed)
Goals Addressed      Track and Manage My Blood Pressure-Hypertension   On track    Timeframe:  Long-Range Goal Priority:  High Start Date: 08/22/20                         Expected End Date: 08/22/21            Follow Up Date: 02/03/21   - check blood pressure 3 times per week - choose a place to take my blood pressure (home, clinic or office, retail store) - write blood pressure results in a log or diary    Why is this important?   You won't feel high blood pressure, but it can still hurt your blood vessels.  High blood pressure can cause heart or kidney problems. It can also cause a stroke.  Making lifestyle changes like losing a Lawrence Bailey weight or eating less salt will help.  Checking your blood pressure at home and at different times of the day can help to control blood pressure.  If the doctor prescribes medicine remember to take it the way the doctor ordered.  Call the office if you cannot afford the medicine or if there are questions about it.     Notes:

## 2020-11-18 NOTE — Chronic Care Management (AMB) (Signed)
Chronic Care Management   CCM RN Visit Note  11/18/2020 Name: Lawrence Bailey MRN: [id number] DOB: 08-14-1940  Subjective: Lawrence Bailey is a 80 y.o. year old male who is a primary care patient of Glendale Chard, MD. The care management team was consulted for assistance with disease management and care coordination needs.    Engaged with patient by telephone for follow up visit in response to provider referral for case management and/or care coordination services.   Consent to Services:  The patient was given information about Chronic Care Management services, agreed to services, and gave verbal consent prior to initiation of services.  Please see initial visit note for detailed documentation.   Patient agreed to services and verbal consent obtained.   Assessment: Review of patient past medical history, allergies, medications, health status, including review of consultants reports, laboratory and other test data, was performed as part of comprehensive evaluation and provision of chronic care management services.   SDOH (Social Determinants of Health) assessments and interventions performed: Yes, no acute challenges    CCM Care Plan  Allergies  Allergen Reactions   Other     Bee sting    Sucralfate Other (See Comments)    Outpatient Encounter Medications as of 11/18/2020  Medication Sig Note   valsartan-hydrochlorothiazide (DIOVAN-HCT) 320-25 MG tablet Take 1 tablet by mouth daily.    amLODipine (NORVASC) 10 MG tablet Take 10 mg by mouth daily.    aspirin 81 MG tablet Take 1 tablet (81 mg total) by mouth daily.    atorvastatin (LIPITOR) 10 MG tablet TAKE 1 TABLET BY MOUTH MONDAY - FRIDAY AT BEDTIME    brimonidine (ALPHAGAN) 0.2 % ophthalmic solution 1 drop 2 (two) times daily.    BYSTOLIC 10 MG tablet Take 10 mg by mouth daily.    cetirizine (ZYRTEC) 10 MG tablet Take 10 mg by mouth daily as needed for allergies.    docusate sodium (COLACE) 100 MG capsule Take 100 mg by mouth 2  (two) times daily.    dorzolamide-timolol (COSOPT) 22.3-6.8 MG/ML ophthalmic solution Place 1 drop into both eyes 2 (two) times daily.    fluticasone (FLONASE) 50 MCG/ACT nasal spray INHALE 2 SPRAYS INTO EACH NOSTRIL EVERY NIGHT    guaiFENesin (MUCINEX) 600 MG 12 hr tablet Take by mouth 2 (two) times daily.    latanoprost (XALATAN) 0.005 % ophthalmic solution Place 1 drop into both eyes at bedtime.    pantoprazole (PROTONIX) 40 MG tablet TAKE 1 TABLET BY MOUTH EVERY DAY    sildenafil (REVATIO) 20 MG tablet Take 20 mg by mouth 3 (three) times daily. 05/24/2018: Reports only uses when plans to be sexually active. Wilmon Arms, RN 05/24/18 1:49 PM       tamsulosin (FLOMAX) 0.4 MG CAPS capsule Take 0.4 mg by mouth.    valsartan-hydrochlorothiazide (DIOVAN-HCT) 160-25 MG tablet TAKE 1 TABLET BY MOUTH EVERY DAY    No facility-administered encounter medications on file as of 11/18/2020.    Patient Active Problem List   Diagnosis Date Noted   Chest pain 08/28/2020   Constipation 08/28/2020   Cough 08/28/2020   Dysphagia 08/28/2020   Hemorrhage of rectum and anus 08/28/2020   Atherosclerosis of aorta ([place]) 07/09/2020   Hypertensive nephropathy 05/02/2020   Gastric stromal tumor ([place]) 11/01/2019   Malignant neoplasm of prostate ([place]) 07/11/2019   Glaucoma 02/28/2018   Syncope and collapse    Syncope 11/08/2016   Stage 3 chronic kidney disease ([place]) 11/08/2016   Essential hypertension 11/08/2016  Hyperglycemia 11/08/2016   Pulmonary contusion 02/04/2012   Thigh hematoma 02/04/2012   H/O malignant gastrointestinal stromal tumor (GIST) 07/20/2011    Conditions to be addressed/monitored:Hypertensive nephropathy, Atherosclerosis of aorta,  Stage 3a chronic kidney disease, Monoclonal gammopathy of unknown significance  Care Plan : Hypertension (Adult)  Updates made by Lynne Logan, RN since 11/18/2020 12:00 AM     Problem: Hypertension (Hypertension)   Priority: High     Long-Range  Goal: Hypertension Monitored   Start Date: 08/22/2020  Expected End Date: 08/22/2021  Recent Progress: On track  Priority: High  Note:   Objective:  Last practice recorded BP readings:  BP Readings from Last 3 Encounters:  11/13/20 140/62  09/12/20 122/72  07/30/20 (!) 142/73  Current Barriers:  Knowledge Deficits related to basic understanding of hypertension pathophysiology and self care management Knowledge Deficits related to understanding of medications prescribed for management of hypertension Case Manager Clinical Goal(s):  patient will demonstrate improved adherence to prescribed treatment plan for hypertension as evidenced by taking all medications as prescribed, monitoring and recording blood pressure as directed, adhering to low sodium/DASH diet Interventions:  11/18/20 completed successful outbound call to patient  Collaboration with Glendale Chard, MD regarding development and update of comprehensive plan of care as evidenced by provider attestation and co-signature Inter-disciplinary care team collaboration (see longitudinal plan of care) Evaluation of current treatment plan related to hypertension self management and patient's adherence to plan as established by provider. Advised patient, providing education and rationale, to monitor blood pressure daily and record, calling PCP for findings outside established parameters.  Determined patient is adhering to dietary and exercise recommendations Discussed new patient Cardiology appointment with Dr. Daneen Schick scheduled for 11/27/20 @11 :40 AM  Reviewed medications with patient and discussed importance of compliance Determined/updated patient medication profile, increase in Valsartan-HCTZ 320 mg-25 mg daily  Answered patient questions/Educated patient on symptomology/cause/treatment for Vertigo; Discussed patient is experiencing "vertigo" since starting Valsartan-HCTZ, discussed symptoms started with low dose and have become  bothersome since increasing dosage over the past couple of months Encouraged patient to take his time when changing positions and to notify his Cardiologist and or PCP if symptoms persist Discussed having patient self monitor his BP/pulse at home, doing spot checks and recording readings for Cardiology review during next scheduled visit Mailed printed AHA BP log to patient's home Discussed plans with patient for ongoing care management follow up and provided patient with direct contact information for care management team Self-Care Activities: Self administers medications as prescribed Attends all scheduled provider appointments Calls provider office for new concerns, questions, or BP outside discussed parameters Checks BP and records as discussed Follows a low sodium diet/DASH diet Patient Goals: - check blood pressure 3 times per week - choose a place to take my blood pressure (home, clinic or office, retail store) - write blood pressure results in a log or diary  Follow Up Plan: Telephone follow up appointment with care management team member scheduled for: 02/03/21    Plan:Telephone follow up appointment with care management team member scheduled for:  02/03/21  Barb Merino, RN, BSN, CCM Care Management Coordinator [facility]  Direct Phone: [phone]

## 2020-11-19 DIAGNOSIS — H401113 Primary open-angle glaucoma, right eye, severe stage: Secondary | ICD-10-CM | POA: Diagnosis not present

## 2020-11-19 DIAGNOSIS — H26492 Other secondary cataract, left eye: Secondary | ICD-10-CM | POA: Diagnosis not present

## 2020-11-19 DIAGNOSIS — H401122 Primary open-angle glaucoma, left eye, moderate stage: Secondary | ICD-10-CM | POA: Diagnosis not present

## 2020-11-19 DIAGNOSIS — M1712 Unilateral primary osteoarthritis, left knee: Secondary | ICD-10-CM | POA: Diagnosis not present

## 2020-11-19 DIAGNOSIS — H04123 Dry eye syndrome of bilateral lacrimal glands: Secondary | ICD-10-CM | POA: Diagnosis not present

## 2020-11-19 DIAGNOSIS — H2511 Age-related nuclear cataract, right eye: Secondary | ICD-10-CM | POA: Diagnosis not present

## 2020-11-19 DIAGNOSIS — M25462 Effusion, left knee: Secondary | ICD-10-CM | POA: Diagnosis not present

## 2020-11-19 DIAGNOSIS — M1711 Unilateral primary osteoarthritis, right knee: Secondary | ICD-10-CM | POA: Diagnosis not present

## 2020-11-19 DIAGNOSIS — Z961 Presence of intraocular lens: Secondary | ICD-10-CM | POA: Diagnosis not present

## 2020-11-26 NOTE — Progress Notes (Signed)
Cardiology Office Note:    Date:  11/27/2020   ID:  Lawrence Bailey, DOB 1940-12-30, MRN [id number]  PCP:  Lawrence Chard, MD  Cardiologist:  None   Referring MD: Lawrence Chard, MD   Chief Complaint  Patient presents with   Hypertension   Dizziness   Advice Only    new cardiologist    History of Present Illness:    Lawrence Bailey is a 80 y.o. male with a hx of aortic atherosclerosis, CAD, and followed by Dr. Terrence Bailey.   According to his wife, he has lost confidence in his cardiologist.  Previously a patient of Dr. Wallene Bailey.  Subsequently referred to Dr. Terrence Bailey who has managed him for the past 5 years.  Primary physician is Dr. Glendale Bailey.  Major complaint is that after he takes medications in the morning he feels dizzy/fuzzy headed.  It eventually gets better late each day.  He feels better but goes through the same process the next day.  This is been happening for greater than 6 months and may be for longer than a year or so.  He walks 2 to 3 miles each morning prior to take his blood pressure medication.  He had a stroke in 2008 that led to right hand weakness and difficulty with his speech.  It was due to an intracranial hemorrhage from an aneurysm.  Rehab led to return to near baseline.  No prior history of MI, heart failure, or significant arrhythmia.  There was some concern that he may need to have a pacemaker in the past but this did not transpire.  Past Medical History:  Diagnosis Date   BPH (benign prostatic hyperplasia)    Coronary artery disease    GIST (gastrointestinal stromal tumor), malignant ([place]) dx'd 04/2007   gleevac comp 04/2008   Glaucoma    Hypertension    Prostate cancer ([place])    Renal insufficiency     Past Surgical History:  Procedure Laterality Date   ABDOMINAL SURGERY     EYE SURGERY     HERNIA REPAIR     KNEE SURGERY      Current Medications: Current Meds  Medication Sig   amLODipine (NORVASC) 10 MG tablet Take 10 mg by mouth  daily.   aspirin 81 MG tablet Take 1 tablet (81 mg total) by mouth daily.   atorvastatin (LIPITOR) 10 MG tablet TAKE 1 TABLET BY MOUTH MONDAY - FRIDAY AT BEDTIME   brimonidine (ALPHAGAN) 0.2 % ophthalmic solution 1 drop 2 (two) times daily.   docusate sodium (COLACE) 100 MG capsule Take 100 mg by mouth 2 (two) times daily.   dorzolamide-timolol (COSOPT) 22.3-6.8 MG/ML ophthalmic solution Place 1 drop into both eyes 2 (two) times daily.   fluticasone (FLONASE) 50 MCG/ACT nasal spray INHALE 2 SPRAYS INTO EACH NOSTRIL EVERY NIGHT   guaiFENesin (MUCINEX) 600 MG 12 hr tablet Take by mouth 2 (two) times daily.   latanoprost (XALATAN) 0.005 % ophthalmic solution Place 1 drop into both eyes at bedtime.   pantoprazole (PROTONIX) 40 MG tablet TAKE 1 TABLET BY MOUTH EVERY DAY   sildenafil (REVATIO) 20 MG tablet Take 20 mg by mouth 3 (three) times daily.   tamsulosin (FLOMAX) 0.4 MG CAPS capsule Take 0.4 mg by mouth.   valsartan-hydrochlorothiazide (DIOVAN-HCT) 320-25 MG tablet Take 1 tablet by mouth daily.   [DISCONTINUED] nebivolol (BYSTOLIC) 5 MG tablet Take 5 mg by mouth daily.     Allergies:   Other and Sucralfate   Social History  Socioeconomic History   Marital status: Married    Spouse name: Not on file   Number of children: Not on file   Years of education: Not on file   Highest education level: Not on file  Occupational History   Occupation: retired  Tobacco Use   Smoking status: Never   Smokeless tobacco: Never  Vaping Use   Vaping Use: Never used  Substance and Sexual Activity   Alcohol use: Not Currently   Drug use: No   Sexual activity: Yes  Other Topics Concern   Not on file  Social History Narrative   Not on file   Social Determinants of Health   Financial Resource Strain: Low Risk    Difficulty of Paying Living Expenses: Not hard at all  Food Insecurity: No Food Insecurity   Worried About Charity fundraiser in the Last Year: Never true   [place] in the  Last Year: Never true  Transportation Needs: No Transportation Needs   Lack of Transportation (Medical): No   Lack of Transportation (Non-Medical): No  Physical Activity: Sufficiently Active   Days of Exercise per Week: 6 days   Minutes of Exercise per Session: 60 min  Stress: No Stress Concern Present   Feeling of Stress : Not at all  Social Connections: Not on file     Family History: The patient's family history includes Cancer in his mother; Hypertension in his mother. There is no history of Breast cancer, Prostate cancer, Colon cancer, or Pancreatic cancer.  ROS:   Please see the history of present illness.    Difficulty with urination.  Lost confidence in his current primary physician.  Enjoys walking every morning.  Denies chest pain, orthopnea, PND.  All other systems reviewed and are negative.  EKGs/Labs/Other Studies Reviewed:    The following studies were reviewed today: Prior cardiac evaluations: CT angio chest 2018: IMPRESSION: No acute pulmonary embolism.   Mild cardiomegaly. Mild bronchial wall thickening associated with bronchitis or reactive airway disease without pneumonia.  Myocardial perfusion imaging February 2015:  IMPRESSION:  1. No reversible ischemia or infarction.   2.  Normal wall motion.   3. Ejection fraction equals 61 % Aortic Atherosclerosis (ICD10-I70.0).   2D Doppler echocardiogram 03/2018: Study Conclusions   - Left ventricle: The cavity size was normal. There was mild    concentric hypertrophy. Systolic function was normal. The    estimated ejection fraction was in the range of 60% to 65%. Wall    motion was normal; there were no regional wall motion    abnormalities. Features are consistent with a pseudonormal left    ventricular filling pattern, with concomitant abnormal relaxation    and increased filling pressure (grade 2 diastolic dysfunction).    Doppler parameters are consistent with high ventricular filling    pressure  (E/e&' 25). L wave present on mitral inflow, and systolic    blunting in pulmonary vein flow.  - Aortic valve: Sclerosis without stenosis. Transvalvular velocity    was within the normal range. There was no stenosis. There was    trivial regurgitation.  - Mitral valve: Transvalvular velocity was within the normal range.    There was no evidence for stenosis. There was trivial    regurgitation.  - Left atrium: The atrium was mildly dilated.  - Right ventricle: The cavity size was normal. Wall thickness was    normal. Systolic function was normal. RV systolic pressure (S,    est): 54 mm Hg.  -  Right atrium: The atrium was normal in size. Central venous    pressure (est): 3 mm Hg.  - Tricuspid valve: There was mild regurgitation.  - Pulmonic valve: There was mild regurgitation.  - Pulmonary arteries: Systolic pressure was severely increased. PA    peak pressure: 54 mm Hg (S).  - Inferior vena cava: The vessel was normal in size. The    respirophasic diameter changes were in the normal range (= 50%),    consistent with normal central venous pressure.  - Pericardium, extracardiac: A trivial pericardial effusion was    identified posterior to the heart.   Impressions:   - Compared to the exam from 11/10/2016, the aortic regurgitation,    mitral regurgitation, tricuspid regurgitation, and pulmonary    regurgitation all appear to have decreased based on color flow.    RVSP has increased. Side by side comparison of images performed.   [facility]11[facility]2020 Left ventricular hypertrophy Normal systolic function with ejection fraction 55% Mild mitral regurgitation Mild aortic regurgitation Mild tricuspid regurgitation Normal left atrial size  EKG:  EKG normal sinus rhythm, borderline first-degree AV block, biatrial abnormality, prominent voltage with anterolateral T wave inversion.  Age when compared to the prior EKG done in June 2021.  Recent  Labs: 07/30/2020: ALT 13; BUN 22; Creatinine 1.44; Hemoglobin 11.9; Platelet Count 165; Potassium 3.4; Sodium 137  Recent Lipid Panel    Component Value Date/Time   CHOL 154 05/02/2020 1732   TRIG 65 05/02/2020 1732   HDL 57 05/02/2020 1732   CHOLHDL 2.7 05/02/2020 1732   LDLCALC 84 05/02/2020 1732    Physical Exam:    VS:  BP (!) 142/88   Pulse 61   Ht 5\' 8"  (1.727 m)   Wt 186 lb 9.6 oz (84.6 kg)   SpO2 99%   BMI 28.37 kg/m     Wt Readings from Last 3 Encounters:  11/27/20 186 lb 9.6 oz (84.6 kg)  11/13/20 188 lb 9.6 oz (85.5 kg)  09/12/20 191 lb (86.6 kg)    Orthostatic blood pressure assessment: Sitting blood pressure 142/82 mmHg and 125/90 mmHg with standing.  Asymptomatic.  GEN: Appears younger than stated age. No acute distress HEENT: Normal NECK: No JVD. LYMPHATICS: No lymphadenopathy CARDIAC: 1/6 systolic right upper sternal murmur. RRR S4 gallop, or edema. VASCULAR:  Normal Pulses. No bruits. RESPIRATORY:  Clear to auscultation without rales, wheezing or rhonchi  ABDOMEN: Soft, non-tender, non-distended, No pulsatile mass, MUSCULOSKELETAL: No deformity  SKIN: Warm and dry NEUROLOGIC:  Alert and oriented x 3 PSYCHIATRIC:  Normal affect   ASSESSMENT:    1. Dizziness   2. Essential hypertension   3. Stage 3a chronic kidney disease ([place])   4. Atherosclerosis of aorta ([place])   5. Malignant neoplasm of prostate ([place])   6. Orthostasis    PLAN:    In order of problems listed above:  Suspect that dizziness and sluggishness after taking his medications in the morning are related to relatively low blood pressure for his age and cardiovascular condition.  Today we demonstrated a nearly 20 point drop in blood pressure systolic with going from sitting to standing.  Following exercise each morning he has some reduction in blood pressure due to vasodilatation plus all of his therapy kicking in lowering blood pressure and likely causing the foggy headed feeling that he has  much of the day until later in the evening.  We have decided to switch tamsulosin to nighttime dosing.  We  will discontinue Bystolic.  He will continue to monitor his blood pressures.  Target for age 54/80 mmHg.  If sitting blood pressures get too high perhaps Bystolic will be restarted and we may need to require that he wear compression stockings and or decrease the diuretic intensity in the valsartan HCTZ. Target 140/80 mmHg.  Probably overtreated currently.  Bystolic discontinued today and tamsulosin moved to p.m. dosing Being followed by primary care Continue statin therapy with target less than or equal to 70.  Followed by Dr. Baird Cancer. Is present.  May need to compensate by wearing compression stockings.   Medication Adjustments/Labs and Tests Ordered: Current medicines are reviewed at length with the patient today.  Concerns regarding medicines are outlined above.  Orders Placed This Encounter  Procedures   EKG 12-Lead   No orders of the defined types were placed in this encounter.   Patient Instructions  Medication Instructions:  Your physician has recommended you make the following change in your medication:  1-STOP Bystolic  2-Please take your Tamsulosin at bedtime  *If you need a refill on your cardiac medications before your next appointment, please call your pharmacy*  Lab Work: If you have labs (blood work) drawn today and your tests are completely normal, you will receive your results only by: [place] (if you have MyChart) OR A paper copy in the mail If you have any lab test that is abnormal or we need to change your treatment, we will call you to review the results.  Testing/Procedures: None ordered today.  Follow-Up: At [facility], you and your health needs are our priority.  As part of our continuing mission to provide you with exceptional heart care, we have created designated Provider Care Teams.  These Care Teams include your primary Cardiologist  (physician) and Advanced Practice Providers (APPs -  Physician Assistants and Nurse Practitioners) who all work together to provide you with the care you need, when you need it.  We recommend signing up for the patient portal called "MyChart".  Sign up information is provided on this After Visit Summary.  MyChart is used to connect with patients for Virtual Visits (Telemedicine).  Patients are able to view lab/test results, encounter notes, upcoming appointments, etc.  Non-urgent messages can be sent to your provider as well.   To learn more about what you can do with MyChart, go to [email or web address].    Your next appointment:   2 month(s)  The format for your next appointment:   In Person  Provider:   You may see HTN clinic or one of the following Advanced Practice Providers on your designated Care Team:   Kathyrn Drown, NP  Please monitor your blood pressures at home   Signed, Sinclair Grooms, MD  11/27/2020 12:52 PM    [place]

## 2020-11-27 ENCOUNTER — Other Ambulatory Visit: Payer: Self-pay

## 2020-11-27 ENCOUNTER — Encounter: Payer: Self-pay | Admitting: Interventional Cardiology

## 2020-11-27 ENCOUNTER — Ambulatory Visit: Payer: Medicare Other | Admitting: Interventional Cardiology

## 2020-11-27 VITALS — BP 142/88 | HR 61 | Ht 68.0 in | Wt 186.6 lb

## 2020-11-27 DIAGNOSIS — N1831 Chronic kidney disease, stage 3a: Secondary | ICD-10-CM | POA: Diagnosis not present

## 2020-11-27 DIAGNOSIS — C61 Malignant neoplasm of prostate: Secondary | ICD-10-CM

## 2020-11-27 DIAGNOSIS — I10 Essential (primary) hypertension: Secondary | ICD-10-CM | POA: Diagnosis not present

## 2020-11-27 DIAGNOSIS — I700 Atherosclerosis of aorta: Secondary | ICD-10-CM

## 2020-11-27 DIAGNOSIS — I951 Orthostatic hypotension: Secondary | ICD-10-CM

## 2020-11-27 DIAGNOSIS — R42 Dizziness and giddiness: Secondary | ICD-10-CM

## 2020-11-27 NOTE — Patient Instructions (Addendum)
Medication Instructions:  Your physician has recommended you make the following change in your medication:  1-STOP Bystolic  2-Please take your Tamsulosin at bedtime  *If you need a refill on your cardiac medications before your next appointment, please call your pharmacy*  Lab Work: If you have labs (blood work) drawn today and your tests are completely normal, you will receive your results only by: [place] (if you have MyChart) OR A paper copy in the mail If you have any lab test that is abnormal or we need to change your treatment, we will call you to review the results.  Testing/Procedures: None ordered today.  Follow-Up: At [facility], you and your health needs are our priority.  As part of our continuing mission to provide you with exceptional heart care, we have created designated Provider Care Teams.  These Care Teams include your primary Cardiologist (physician) and Advanced Practice Providers (APPs -  Physician Assistants and Nurse Practitioners) who all work together to provide you with the care you need, when you need it.  We recommend signing up for the patient portal called "MyChart".  Sign up information is provided on this After Visit Summary.  MyChart is used to connect with patients for Virtual Visits (Telemedicine).  Patients are able to view lab/test results, encounter notes, upcoming appointments, etc.  Non-urgent messages can be sent to your provider as well.   To learn more about what you can do with MyChart, go to [email or web address].    Your next appointment:   2 month(s)  The format for your next appointment:   In Person  Provider:   You may see HTN clinic or one of the following Advanced Practice Providers on your designated Care Team:   Kathyrn Drown, NP  Please monitor your blood pressures at home

## 2020-11-29 ENCOUNTER — Other Ambulatory Visit: Payer: Self-pay

## 2020-11-29 ENCOUNTER — Encounter: Payer: Self-pay | Admitting: Podiatry

## 2020-11-29 ENCOUNTER — Ambulatory Visit: Payer: Medicare Other | Admitting: Podiatry

## 2020-11-29 DIAGNOSIS — N183 Chronic kidney disease, stage 3 unspecified: Secondary | ICD-10-CM | POA: Diagnosis not present

## 2020-11-29 DIAGNOSIS — B351 Tinea unguium: Secondary | ICD-10-CM | POA: Diagnosis not present

## 2020-11-29 DIAGNOSIS — M79675 Pain in left toe(s): Secondary | ICD-10-CM | POA: Diagnosis not present

## 2020-11-29 DIAGNOSIS — M79674 Pain in right toe(s): Secondary | ICD-10-CM

## 2020-11-29 NOTE — Progress Notes (Signed)
This patient returns to my office for at risk foot care.  This patient requires this care by a professional since this patient will be at risk due to having chronic kidney disease.  This patient is unable to cut nails himself since the patient cannot reach his nails.These nails are painful walking and wearing shoes.  This patient presents for at risk foot care today.  General Appearance  Alert, conversant and in no acute stress.  Vascular  Dorsalis pedis and posterior tibial  pulses are palpable  bilaterally.  Capillary return is within normal limits  bilaterally. Temperature is within normal limits  bilaterally.  Neurologic  Senn-Weinstein monofilament wire test within normal limits  bilaterally. Muscle power within normal limits bilaterally.  Nails Thick disfigured discolored nails with subungual debris  from hallux to fifth toes bilaterally. No evidence of bacterial infection or drainage bilaterally.  Orthopedic  No limitations of motion  feet .  No crepitus or effusions noted.  No bony pathology or digital deformities noted. Pes planus  Right greater than left.  HAV right greater than left.  Excessive pronation right foot than left  .PTTD right foot  Skin  normotropic skin with no porokeratosis noted bilaterally.  No signs of infections or ulcers noted.  Pinch callus right foot.  Onychomycosis  Pain in right toes  Pain in left toes  Consent was obtained for treatment procedures.   Mechanical debridement of nails 1-5  bilaterally performed with a nail nipper.  Filed with dremel without incident.    Return office visit   12 weeks                  Told patient to return for periodic foot care and evaluation due to potential at risk complications.   Gardiner Barefoot DPM

## 2020-11-30 ENCOUNTER — Other Ambulatory Visit: Payer: Self-pay | Admitting: Internal Medicine

## 2020-12-12 ENCOUNTER — Other Ambulatory Visit: Payer: Self-pay | Admitting: Urology

## 2020-12-12 DIAGNOSIS — C61 Malignant neoplasm of prostate: Secondary | ICD-10-CM

## 2020-12-28 ENCOUNTER — Other Ambulatory Visit: Payer: Self-pay | Admitting: Internal Medicine

## 2020-12-31 ENCOUNTER — Telehealth: Payer: Self-pay

## 2020-12-31 NOTE — Chronic Care Management (AMB) (Signed)
   Patient aware of telephone appointment with Orlando Penner CPP on 01-01-2021 at 1:00. Patient aware to have/bring all medications, supplements, blood pressure and/or blood sugar logs to visit.  Questions: Have you had any recent office visit or specialist visit outside of [place]? Patient stated no  Are there any concerns you would like to discuss during your office visit? Patient stated no  Are you having any problems obtaining your medications? (Whether it pharmacy issues or cost) Patient stated no  If patient has any PAP medications ask if they are having any problems getting their PAP medication or refill? No PAP medications  Care Gaps: Shingrix overdue PNA vac overdue Medicare wellness 11-20-2021 RAF= 1.953%  Star Rating Drug: Atorvastatin 10 mg- Last filled 12-30-2020 30 DS [facility] Valsartan-HTCZ 320-25 mg- Last filled 11-24-2020 90 DS   Any gaps in medications fill history? No   [place] Pharmacist Assistant [phone]

## 2021-01-01 ENCOUNTER — Ambulatory Visit (INDEPENDENT_AMBULATORY_CARE_PROVIDER_SITE_OTHER): Payer: Medicare Other

## 2021-01-01 DIAGNOSIS — I10 Essential (primary) hypertension: Secondary | ICD-10-CM | POA: Diagnosis not present

## 2021-01-01 DIAGNOSIS — I700 Atherosclerosis of aorta: Secondary | ICD-10-CM

## 2021-01-01 NOTE — Progress Notes (Signed)
Chronic Care Management Pharmacy Note  01/09/2021 Name:  Lawrence Bailey MRN:  [id number] DOB:  Mar 26, 1941  Summary: Patient reports doing well and taking his medications everyday.    Recommendations/Changes made from today's visit: Recommend patient check his BP at least four times per week and document in log book.  Recommend patient receive shingrix vaccine.   Plan: Patient reports he is going to purchase a BP cuff from the [facility] OTC book.    Subjective: Lawrence Bailey is an 80 y.o. year old male who is a primary patient of Glendale Chard, MD.  The CCM team was consulted for assistance with disease management and care coordination needs.    Engaged with patient by telephone for follow up visit in response to provider referral for pharmacy case management and/or care coordination services.   Consent to Services:  The patient was given information about Chronic Care Management services, agreed to services, and gave verbal consent prior to initiation of services.  Please see initial visit note for detailed documentation.   Patient Care Team: Glendale Chard, MD as PCP - General (Internal Medicine) Johnathan Hausen, MD as Consulting Physician (General Surgery) Carol Ada, MD as Consulting Physician (Gastroenterology) Charolette Forward, MD as Consulting Physician (Cardiology) Warden Fillers, MD as Consulting Physician (Ophthalmology) Daneen Schick as Social Worker [place], Claudette Stapler, RN as [place], Sharyn Blitz, [facility] (Pharmacist)  Recent office visits: 09/12/2020 PCP OV   Recent consult visits: 11/29/2020 Podiatry OV  11/27/2020 Cardiology [facility] visits: None in previous 6 months   Objective:  Lab Results  Component Value Date   CREATININE 1.44 (H) 07/30/2020   BUN 22 07/30/2020   GFRNONAA 49 (L) 07/30/2020   GFRAA 62 07/09/2020   NA 137 07/30/2020   K 3.4 (L) 07/30/2020   CALCIUM 9.2 07/30/2020   CO2 29 07/30/2020    GLUCOSE 110 (H) 07/30/2020    Lab Results  Component Value Date/Time   HGBA1C 5.6 07/09/2020 03:30 PM   HGBA1C 5.6 06/15/2018 03:03 PM   MICROALBUR 30 11/01/2019 05:21 PM    Last diabetic Eye exam: No results found for: HMDIABEYEEXA  Last diabetic Foot exam: No results found for: HMDIABFOOTEX   Lab Results  Component Value Date   CHOL 154 05/02/2020   HDL 57 05/02/2020   LDLCALC 84 05/02/2020   TRIG 65 05/02/2020   CHOLHDL 2.7 05/02/2020    Hepatic Function Latest Ref Rng & Units 07/30/2020 02/01/2020 11/01/2019  Total Protein 6.5 - 8.1 g/dL 8.3(H) 8.4(H) 8.2  Albumin 3.5 - 5.0 g/dL 3.8 3.8 4.0  AST 15 - 41 U/L 16 17 18   ALT 0 - 44 U/L 13 9 13   Alk Phosphatase 38 - 126 U/L 50 49 54  Total Bilirubin 0.3 - 1.2 mg/dL 0.6 0.8 0.9    Lab Results  Component Value Date/Time   TSH 0.969 11/08/2016 10:42 AM    CBC Latest Ref Rng & Units 07/30/2020 07/09/2020 02/01/2020  WBC 4.0 - 10.5 K/uL 4.3 4.0 4.8  Hemoglobin 13.0 - 17.0 g/dL 11.9(L) 12.2(L) 11.9(L)  Hematocrit 39.0 - 52.0 % 38.5(L) 36.6(L) 37.6(L)  Platelets 150 - 400 K/uL 165 258 140(L)    No results found for: VD25OH  Clinical ASCVD: Yes  The ASCVD Risk score Mikey Bussing DC Jr., et al., 2013) failed to calculate for the following reasons:   The 2013 ASCVD risk score is only valid for ages 2 to 16    Depression screen PHQ 2/9  11/13/2020 11/01/2019 11/01/2019  Decreased Interest 0 0 0  Down, Depressed, Hopeless 0 0 0  PHQ - 2 Score 0 0 0  Altered sleeping - 0 -  Tired, decreased energy - 0 -  Change in appetite - 0 -  Feeling bad or failure about yourself  - 0 -  Trouble concentrating - 0 -  Moving slowly or fidgety/restless - 0 -  Suicidal thoughts - - -  PHQ-9 Score - 0 -  Difficult doing work/chores - Not difficult at all -     Social History   Tobacco Use  Smoking Status Never  Smokeless Tobacco Never   BP Readings from Last 3 Encounters:  11/27/20 (!) 142/88  11/13/20 140/62  09/12/20 122/72   Pulse Readings from  Last 3 Encounters:  11/27/20 61  11/13/20 (!) 51  09/12/20 (!) 55   Wt Readings from Last 3 Encounters:  11/27/20 186 lb 9.6 oz (84.6 kg)  11/13/20 188 lb 9.6 oz (85.5 kg)  09/12/20 191 lb (86.6 kg)   BMI Readings from Last 3 Encounters:  11/27/20 28.37 kg/m  11/13/20 27.69 kg/m  09/12/20 28.37 kg/m    Assessment/Interventions: Review of patient past medical history, allergies, medications, health status, including review of consultants reports, laboratory and other test data, was performed as part of comprehensive evaluation and provision of chronic care management services.   SDOH:  (Social Determinants of Health) assessments and interventions performed: No  SDOH Screenings   Alcohol Screen: Not on file  Depression (PHQ2-9): Low Risk    PHQ-2 Score: 0  Financial Resource Strain: Low Risk    Difficulty of Paying Living Expenses: Not hard at all  Food Insecurity: No Food Insecurity   Worried About Charity fundraiser in the Last Year: Never true   Ran Out of Food in the Last Year: Never true  Housing: Low Risk    Last Housing Risk Score: 0  Physical Activity: Sufficiently Active   Days of Exercise per Week: 6 days   Minutes of Exercise per Session: 60 min  Social Connections: Not on file  Stress: No Stress Concern Present   Feeling of Stress : Not at all  Tobacco Use: Low Risk    Smoking Tobacco Use: Never   Smokeless Tobacco Use: Never  Transportation Needs: No Transportation Needs   Lack of Transportation (Medical): No   Lack of Transportation (Non-Medical): No    CCM Care Plan  Allergies  Allergen Reactions   Other     Bee sting    Sucralfate Other (See Comments)    Medications Reviewed Today     Reviewed by Mayford Knife, RPH (Pharmacist) on 01/01/21 at 1311  Med List Status: <None>   Medication Order Taking? Sig Documenting Provider Last Dose Status Informant  amLODipine (NORVASC) 10 MG tablet [id number] No Take 10 mg by mouth daily. [provider] Taking Active   aspirin 81 MG tablet [id number] No Take 1 tablet (81 mg total) by mouth daily. Saverio Danker, PA-C Taking Active Self  atorvastatin (LIPITOR) 10 MG tablet [id number]  TAKE 1 TABLET BY MOUTH MONDAY - FRIDAY AT BEDTIME Glendale Chard, MD  Active   brimonidine [facility]) 0.2 % ophthalmic solution [id number] No 1 drop 2 (two) times daily. [provider] Taking Active   cetirizine (ZYRTEC) 10 MG tablet [id number] No Take 10 mg by mouth daily as needed for allergies.  Patient not taking: Reported on 11/27/2020   [provider] Not Taking Active  docusate sodium (COLACE) 100 MG capsule [id number] No Take 100 mg by mouth 2 (two) times daily. [provider] Taking Active   dorzolamide-timolol (COSOPT) 22.3-6.8 MG/ML ophthalmic solution [id number] No Place 1 drop into both eyes 2 (two) times daily. [provider] Taking Active Self  fluticasone (FLONASE) 50 MCG/ACT nasal spray [id number] No INHALE 2 SPRAYS INTO EACH NOSTRIL EVERY NIGHT Rozetta Nunnery, MD Taking Active   guaiFENesin (MUCINEX) 600 MG 12 hr tablet [id number] No Take by mouth 2 (two) times daily. [provider] Taking Active   latanoprost (XALATAN) 0.005 % ophthalmic solution [id number] No Place 1 drop into both eyes at bedtime. [provider] Taking Active Self  pantoprazole (PROTONIX) 40 MG tablet [id number] No TAKE 1 TABLET BY MOUTH EVERY DAY Glendale Chard, MD Taking Active   sildenafil (REVATIO) 20 MG tablet [id number] No Take 20 mg by mouth 3 (three) times daily. [provider] Taking Active            Med Note Amedeo Plenty, PAMELA   Wed Nov 27, 2020 11:39 AM)    tamsulosin (FLOMAX) 0.4 MG CAPS capsule [id number] No Take 0.4 mg by mouth. [provider] Taking Active   valsartan-hydrochlorothiazide (DIOVAN-HCT) 320-25 MG tablet [id number] No Take 1 tablet by mouth daily. [provider] Taking Active             Patient Active  Problem List   Diagnosis Date Noted   Chest pain 08/28/2020   Constipation 08/28/2020   Cough 08/28/2020   Dysphagia 08/28/2020   Hemorrhage of rectum and anus 08/28/2020   Atherosclerosis of aorta ([place]) 07/09/2020   Hypertensive nephropathy 05/02/2020   Gastric stromal tumor ([place]) 11/01/2019   Malignant neoplasm of prostate ([place]) 07/11/2019   Glaucoma 02/28/2018   Syncope and collapse    Syncope 11/08/2016   Stage 3 chronic kidney disease ([place]) 11/08/2016   Essential hypertension 11/08/2016   Hyperglycemia 11/08/2016   Pulmonary contusion 02/04/2012   Thigh hematoma 02/04/2012   H/O malignant gastrointestinal stromal tumor (GIST) 07/20/2011    Immunization History  Administered Date(s) Administered   Fluad Quad(high Dose 65+) 03/15/2019, 07/09/2020   PFIZER(Purple Top)SARS-COV-2 Vaccination 07/01/2019, 07/24/2019, 05/24/2020, 09/22/2020   Tdap 03/15/2019    Conditions to be addressed/monitored:  Hypertension and Hyperlipidemia  Care Plan : [place]  Updates made by Mayford Knife, [place] since 01/09/2021 12:00 AM     Problem: HTN, Atherosclerosis of Aorta   Priority: High       Medication Assistance: None required.  Patient affirms current coverage meets needs.  Compliance/Adherence/Medication fill history: Care Gaps: Shingrix Vaccine  Pneumonia Vaccine   Star-Rating Drugs: Atorvastatin 10 mg tablet Valsartan-Hydrochlorothiazide 320-25 mg tablet   Patient's preferred pharmacy is:  [id number]- [place], [place] - [address] [id number]: [phone]: [phone] Uses pill box? Yes Pt endorses 90% compliance  We discussed: Benefits of medication synchronization, packaging and delivery as well as enhanced pharmacist oversight with [employer]. Patient decided to: Continue current medication management strategy  Care Plan and Follow Up Patient  Decision:  Patient agrees to Care Plan and Follow-up.  Plan: The patient has been provided with contact information for the care management team and has been advised to call with any health related questions or concerns.   VOrlando Penner PharmD Clinical Pharmacist [facility] [phone]

## 2021-01-02 ENCOUNTER — Other Ambulatory Visit: Payer: Self-pay

## 2021-01-02 ENCOUNTER — Ambulatory Visit
Admission: RE | Admit: 2021-01-02 | Discharge: 2021-01-02 | Disposition: A | Discharge status: Home / Self Care | Payer: Medicare Other | Source: Ambulatory Visit | Attending: Urology | Admitting: Urology

## 2021-01-02 DIAGNOSIS — C61 Malignant neoplasm of prostate: Secondary | ICD-10-CM

## 2021-01-02 DIAGNOSIS — R590 Localized enlarged lymph nodes: Secondary | ICD-10-CM | POA: Diagnosis not present

## 2021-01-02 MED ORDER — GADOBENATE DIMEGLUMINE 529 MG/ML IV SOLN
17.0000 mL | Freq: Once | INTRAVENOUS | Status: AC | PRN
  Administered 2021-01-02: 17 mL via INTRAVENOUS

## 2021-01-08 NOTE — Patient Instructions (Signed)
Visit Information It was great speaking with you today!  Please let me know if you have any questions about our visit.   Goals Addressed             This Visit's Progress    Manage My Medicine       Timeframe:  Long-Range Goal Priority:  High Start Date:     08/01/2020                       Expected End Date:                       Follow Up Date 07/03/2021  In progress:    - call for medicine refill 2 or 3 days before it runs out - call if I am sick and can't take my medicine - learn to read medicine labels - use an alarm clock or phone to remind me to take my medicine    Why is this important?   These steps will help you keep on track with your medicines.         Patient Care Plan: CCM Pharmacy Care Plan     Problem Identified: HTN, Atherosclerosis of Aorta   Priority: High     Long-Range Goal: Disease Management   Start Date: 08/01/2020  Recent Progress: On track  Priority: High  Note:     Current Barriers:  Unable to independently monitor therapeutic efficacy  Pharmacist Clinical Goal(s):  Patient will achieve adherence to monitoring guidelines and medication adherence to achieve therapeutic efficacy through collaboration with PharmD and provider.   Interventions: 1:1 collaboration with Glendale Chard, MD regarding development and update of comprehensive plan of care as evidenced by provider attestation and co-signature Inter-disciplinary care team collaboration (see longitudinal plan of care) Comprehensive medication review performed; medication list updated in electronic medical record  Hypertension  (Status:Goal on track: YES.)    (BP goal <140/90) -Uncontrolled -Current treatment: Amlodipine 10 mg tablet once a day in the morning Valsartan - Hydrochlorothiazide 320-25 mg tablet once per day  -Current home readings: 142/82, 157/93, 148/83, 141/87, 135/73,144/72, 155/87, 148/89  -Current dietary habits: patient reports he is eating better, he does  not put any salt in his food  -Current exercise habits: still walking, and mowing the yard.  -Denies hypotensive/hypertensive symptoms -Educated on Importance of home blood pressure monitoring; Proper BP monitoring technique; -Counseled to monitor BP at home at least 5 times per day, document, and provide log at future appointments -Recommended to continue current medication  Atherosclerosis of the Aorta: (LDL goal < 70) -Not ideally controlled -Current treatment: Atorvastatin 10 mg taking 1 tablet by mouth Monday through Friday.  Aspirin 81 mg tablet daily  Sildenafil 20 mg tablet three times per day  Valsartan-hydrochlorothiazide 320-25 mg tablet once per day  Amlodipine 10 mg tablet once per day  -Current dietary patterns: patient is eating plenty of fruits and vegetables  -Current exercise habits: please see hypertension  -Congratulated patient on taking his medication routinely  -Educated on Cholesterol goals;  Benefits of statin for ASCVD risk reduction; -Recommended to continue current medication  Patient Goals/Self-Care Activities Patient will:  - take medications as prescribed  Follow Up Plan: The patient has been provided with contact information for the care management team and has been advised to call with any health related questions or concerns.        Patient agreed to services and verbal consent obtained.   The  patient verbalized understanding of instructions, educational materials, and care plan provided today and agreed to receive a mailed copy of patient instructions, educational materials, and care plan.   Orlando Penner, PharmD Clinical Pharmacist [facility] [phone]

## 2021-01-15 DIAGNOSIS — D472 Monoclonal gammopathy: Secondary | ICD-10-CM | POA: Diagnosis not present

## 2021-01-15 DIAGNOSIS — D649 Anemia, unspecified: Secondary | ICD-10-CM | POA: Diagnosis not present

## 2021-01-15 DIAGNOSIS — H04123 Dry eye syndrome of bilateral lacrimal glands: Secondary | ICD-10-CM | POA: Diagnosis not present

## 2021-01-15 DIAGNOSIS — Z961 Presence of intraocular lens: Secondary | ICD-10-CM | POA: Diagnosis not present

## 2021-01-15 DIAGNOSIS — I129 Hypertensive chronic kidney disease with stage 1 through stage 4 chronic kidney disease, or unspecified chronic kidney disease: Secondary | ICD-10-CM | POA: Diagnosis not present

## 2021-01-15 DIAGNOSIS — H401122 Primary open-angle glaucoma, left eye, moderate stage: Secondary | ICD-10-CM | POA: Diagnosis not present

## 2021-01-15 DIAGNOSIS — K219 Gastro-esophageal reflux disease without esophagitis: Secondary | ICD-10-CM | POA: Diagnosis not present

## 2021-01-15 DIAGNOSIS — N1831 Chronic kidney disease, stage 3a: Secondary | ICD-10-CM | POA: Diagnosis not present

## 2021-01-15 DIAGNOSIS — H2511 Age-related nuclear cataract, right eye: Secondary | ICD-10-CM | POA: Diagnosis not present

## 2021-01-15 DIAGNOSIS — H401113 Primary open-angle glaucoma, right eye, severe stage: Secondary | ICD-10-CM | POA: Diagnosis not present

## 2021-01-23 ENCOUNTER — Other Ambulatory Visit: Payer: Self-pay | Admitting: Internal Medicine

## 2021-01-27 ENCOUNTER — Other Ambulatory Visit (INDEPENDENT_AMBULATORY_CARE_PROVIDER_SITE_OTHER): Payer: Self-pay | Admitting: Otolaryngology

## 2021-01-30 ENCOUNTER — Other Ambulatory Visit: Payer: Self-pay | Admitting: Internal Medicine

## 2021-01-30 ENCOUNTER — Inpatient Hospital Stay: Payer: Medicare Other | Attending: Oncology

## 2021-01-30 ENCOUNTER — Inpatient Hospital Stay: Payer: Medicare Other | Admitting: Oncology

## 2021-01-30 VITALS — BP 156/91 | HR 61 | Temp 98.1°F | Resp 20 | Ht 68.0 in | Wt 184.0 lb

## 2021-01-30 DIAGNOSIS — N189 Chronic kidney disease, unspecified: Secondary | ICD-10-CM | POA: Diagnosis not present

## 2021-01-30 DIAGNOSIS — Z86718 Personal history of other venous thrombosis and embolism: Secondary | ICD-10-CM | POA: Diagnosis not present

## 2021-01-30 DIAGNOSIS — I129 Hypertensive chronic kidney disease with stage 1 through stage 4 chronic kidney disease, or unspecified chronic kidney disease: Secondary | ICD-10-CM | POA: Diagnosis not present

## 2021-01-30 DIAGNOSIS — D472 Monoclonal gammopathy: Secondary | ICD-10-CM | POA: Insufficient documentation

## 2021-01-30 LAB — CBC WITH DIFFERENTIAL (CANCER CENTER ONLY)
Abs Immature Granulocytes: 0.02 10*3/uL (ref 0.00–0.07)
Basophils Absolute: 0 10*3/uL (ref 0.0–0.1)
Basophils Relative: 0 %
Eosinophils Absolute: 0 10*3/uL (ref 0.0–0.5)
Eosinophils Relative: 1 %
HCT: 38 % — ABNORMAL LOW (ref 39.0–52.0)
Hemoglobin: 11.7 g/dL — ABNORMAL LOW (ref 13.0–17.0)
Immature Granulocytes: 0 %
Lymphocytes Relative: 16 %
Lymphs Abs: 1 10*3/uL (ref 0.7–4.0)
MCH: 27 pg (ref 26.0–34.0)
MCHC: 30.8 g/dL (ref 30.0–36.0)
MCV: 87.6 fL (ref 80.0–100.0)
Monocytes Absolute: 0.5 10*3/uL (ref 0.1–1.0)
Monocytes Relative: 9 %
Neutro Abs: 4.6 10*3/uL (ref 1.7–7.7)
Neutrophils Relative %: 74 %
Platelet Count: 145 10*3/uL — ABNORMAL LOW (ref 150–400)
RBC: 4.34 MIL/uL (ref 4.22–5.81)
RDW: 14.3 % (ref 11.5–15.5)
WBC Count: 6.1 10*3/uL (ref 4.0–10.5)
nRBC: 0 % (ref 0.0–0.2)

## 2021-01-30 LAB — CMP (CANCER CENTER ONLY)
ALT: 13 U/L (ref 0–44)
AST: 16 U/L (ref 15–41)
Albumin: 3.9 g/dL (ref 3.5–5.0)
Alkaline Phosphatase: 44 U/L (ref 38–126)
Anion gap: 5 (ref 5–15)
BUN: 21 mg/dL (ref 8–23)
CO2: 30 mmol/L (ref 22–32)
Calcium: 9.5 mg/dL (ref 8.9–10.3)
Chloride: 100 mmol/L (ref 98–111)
Creatinine: 1.22 mg/dL (ref 0.61–1.24)
GFR, Estimated: 60 mL/min — ABNORMAL LOW (ref >60)
Glucose, Bld: 78 mg/dL (ref 70–99)
Potassium: 4.2 mmol/L (ref 3.5–5.1)
Sodium: 135 mmol/L (ref 135–145)
Total Bilirubin: 0.6 mg/dL (ref 0.3–1.2)
Total Protein: 8.3 g/dL — ABNORMAL HIGH (ref 6.5–8.1)

## 2021-01-30 NOTE — Progress Notes (Signed)
  [place] OFFICE PROGRESS NOTE   Diagnosis: History of gastric GIST, monoclonal myopathy  INTERVAL HISTORY:   Lawrence Bailey returns as scheduled.  He feels well.  Good appetite.  No difficulty with bowel or bladder function.  He has occasional discomfort at the left lower back.  No consistent pain.  He reports he is scheduled for a prostate biopsy by Dr. Lovena Neighbours. The lipoma at the left posterior neck is smaller. Objective:  Vital signs in last 24 hours:  Blood pressure (!) 156/91, pulse 61, temperature 98.1 F (36.7 C), temperature source Oral, resp. rate 20, height $RemoveBe'5\' 8"'[id number]$  (1.727 m), weight 184 lb (83.5 kg), SpO2 100 %.    HEENT: Soft mobile mass at the left lower posterior neck Lymphatics: No cervical, supraclavicular, or axillary nodes.  1 cm medial left inguinal node? Resp: Lungs clear bilaterally Cardio: Regular rate and rhythm GI: No hepatosplenomegaly, no mass, nontender Vascular: No leg edema Musculoskeletal: No tenderness or mass at the left lower back Lab Results:  Lab Results  Component Value Date   WBC 6.1 01/30/2021   HGB 11.7 (L) 01/30/2021   HCT 38.0 (L) 01/30/2021   MCV 87.6 01/30/2021   PLT 145 (L) 01/30/2021   NEUTROABS 4.6 01/30/2021    CMP  Lab Results  Component Value Date   NA 137 07/30/2020   K 3.4 (L) 07/30/2020   CL 103 07/30/2020   CO2 29 07/30/2020   GLUCOSE 110 (H) 07/30/2020   BUN 22 07/30/2020   CREATININE 1.44 (H) 07/30/2020   CALCIUM 9.2 07/30/2020   PROT 8.3 (H) 07/30/2020   ALBUMIN 3.8 07/30/2020   AST 16 07/30/2020   ALT 13 07/30/2020   ALKPHOS 50 07/30/2020   BILITOT 0.6 07/30/2020   GFRNONAA 49 (L) 07/30/2020   GFRAA 62 07/09/2020     Medications: I have reviewed the patient's current medications.   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 Pathology showed a 6 cm GI stromal tumor. No vascular or lymphatic invasion. All surgical  margins negative. Lymph nodes not sampled. Tumor confined to the submucosa. Mitotic activity 25-50 mitoses per high-power field. He completed 1 year of Gleevec 400 mg daily. Restaging CT abdomen/pelvis 04/10/2014 with no evidence of recurrent/metastatic disease.  CT abdomen/pelvis 05/12/2018 without evidence of local recurrence or metastatic disease. History of right lower extremity DVT June 2018. Evaluation in the emergency department 03/08/2018 for right leg redness, swelling and tenderness.  Venous Doppler negative for DVT.  Incidental finding of a possible 3.3 cm right inguinal lymph node.  CT abdomen/pelvis 05/12/2018 showed stable small inguinal lymph nodes bilaterally. Chronic renal failure Monoclonal gammopathy of unknown significance, IgG lambda 01/09/2019 SPEP with M spike 1.5  07/05/2019 SPEP with M spike 1.9 Hypertension Prostate cancer-on surveillance, followed by urology     Disposition: Lawrence Bailey appears stable.  There is no clinical evidence for progression to multiple myeloma or another lymphoproliferative disorder.  We will follow-up on the serum protein electrophoresis and IgG level from today from today.  He will return for an office and lab visit in 1 year.  He will continue follow-up with Dr. Lovena Neighbours for management of prostate cancer.  Betsy Coder, MD  01/30/2021  11:00 AM

## 2021-01-31 LAB — IGG: IgG (Immunoglobin G), Serum: 2884 mg/dL — ABNORMAL HIGH (ref 603–1613)

## 2021-01-31 LAB — KAPPA/LAMBDA LIGHT CHAINS
Kappa free light chain: 35.7 mg/L — ABNORMAL HIGH (ref 3.3–19.4)
Kappa, lambda light chain ratio: 1.33 (ref 0.26–1.65)
Lambda free light chains: 26.8 mg/L — ABNORMAL HIGH (ref 5.7–26.3)

## 2021-02-03 ENCOUNTER — Telehealth: Payer: Medicare Other

## 2021-02-03 ENCOUNTER — Ambulatory Visit (INDEPENDENT_AMBULATORY_CARE_PROVIDER_SITE_OTHER): Payer: Medicare Other

## 2021-02-03 DIAGNOSIS — N1831 Chronic kidney disease, stage 3a: Secondary | ICD-10-CM

## 2021-02-03 DIAGNOSIS — I700 Atherosclerosis of aorta: Secondary | ICD-10-CM

## 2021-02-03 DIAGNOSIS — I129 Hypertensive chronic kidney disease with stage 1 through stage 4 chronic kidney disease, or unspecified chronic kidney disease: Secondary | ICD-10-CM

## 2021-02-03 DIAGNOSIS — D472 Monoclonal gammopathy: Secondary | ICD-10-CM

## 2021-02-03 LAB — PROTEIN ELECTROPHORESIS, SERUM
A/G Ratio: 0.9 (ref 0.7–1.7)
Albumin ELP: 3.7 g/dL (ref 2.9–4.4)
Alpha-1-Globulin: 0.2 g/dL (ref 0.0–0.4)
Alpha-2-Globulin: 0.5 g/dL (ref 0.4–1.0)
Beta Globulin: 0.8 g/dL (ref 0.7–1.3)
Gamma Globulin: 2.7 g/dL — ABNORMAL HIGH (ref 0.4–1.8)
Globulin, Total: 4.3 g/dL — ABNORMAL HIGH (ref 2.2–3.9)
M-Spike, %: 2.3 g/dL — ABNORMAL HIGH
Total Protein ELP: 8 g/dL (ref 6.0–8.5)

## 2021-02-03 NOTE — Chronic Care Management (AMB) (Signed)
Chronic Care Management   CCM RN Visit Note  02/03/2021 Name: Lawrence Bailey MRN: [id number] DOB: 1941/01/25  Subjective: Lawrence Bailey is a 80 y.o. year old male who is a primary care patient of Glendale Chard, MD. The care management team was consulted for assistance with disease management and care coordination needs.    Engaged with patient by telephone for follow up visit in response to provider referral for case management and/or care coordination services.   Consent to Services:  The patient was given information about Chronic Care Management services, agreed to services, and gave verbal consent prior to initiation of services.  Please see initial visit note for detailed documentation.   Patient agreed to services and verbal consent obtained.   Assessment: Review of patient past medical history, allergies, medications, health status, including review of consultants reports, laboratory and other test data, was performed as part of comprehensive evaluation and provision of chronic care management services.   SDOH (Social Determinants of Health) assessments and interventions performed:    CCM Care Plan  Allergies  Allergen Reactions   Other     Bee sting    Sucralfate Other (See Comments)    Outpatient Encounter Medications as of 02/03/2021  Medication Sig   amLODipine (NORVASC) 10 MG tablet Take 10 mg by mouth daily.   aspirin 81 MG tablet Take 1 tablet (81 mg total) by mouth daily.   atorvastatin (LIPITOR) 10 MG tablet TAKE 1 TABLET BY MOUTH MONDAY - FRIDAY AT BEDTIME   brimonidine (ALPHAGAN) 0.2 % ophthalmic solution 1 drop 2 (two) times daily.   cetirizine (ZYRTEC) 10 MG tablet Take 10 mg by mouth daily as needed for allergies. (Patient not taking: No sig reported)   docusate sodium (COLACE) 100 MG capsule Take 100 mg by mouth 2 (two) times daily.   dorzolamide-timolol (COSOPT) 22.3-6.8 MG/ML ophthalmic solution Place 1 drop into both eyes 2 (two) times daily.    fluticasone (FLONASE) 50 MCG/ACT nasal spray INHALE 2 SPRAYS INTO EACH NOSTRIL EVERY NIGHT   guaiFENesin (MUCINEX) 600 MG 12 hr tablet Take by mouth 2 (two) times daily.   latanoprost (XALATAN) 0.005 % ophthalmic solution Place 1 drop into both eyes at bedtime.   pantoprazole (PROTONIX) 40 MG tablet TAKE 1 TABLET BY MOUTH EVERY DAY   sildenafil (REVATIO) 20 MG tablet Take 20 mg by mouth 3 (three) times daily.   tamsulosin (FLOMAX) 0.4 MG CAPS capsule Take 0.4 mg by mouth.   valsartan-hydrochlorothiazide (DIOVAN-HCT) 320-25 MG tablet Take 1 tablet by mouth daily.   No facility-administered encounter medications on file as of 02/03/2021.    Patient Active Problem List   Diagnosis Date Noted   Chest pain 08/28/2020   Constipation 08/28/2020   Cough 08/28/2020   Dysphagia 08/28/2020   Hemorrhage of rectum and anus 08/28/2020   Atherosclerosis of aorta ([place]) 07/09/2020   Hypertensive nephropathy 05/02/2020   Gastric stromal tumor ([place]) 11/01/2019   Malignant neoplasm of prostate ([place]) 07/11/2019   Glaucoma 02/28/2018   Syncope and collapse    Syncope 11/08/2016   Stage 3 chronic kidney disease ([place]) 11/08/2016   Essential hypertension 11/08/2016   Hyperglycemia 11/08/2016   Pulmonary contusion 02/04/2012   Thigh hematoma 02/04/2012   H/O malignant gastrointestinal stromal tumor (GIST) 07/20/2011    Conditions to be addressed/monitored: Hypertensive nephropathy, Atherosclerosis of aorta,  Stage 3a chronic kidney disease, Monoclonal gammopathy of unknown significance  Care Plan : Chronic Kidney (Adult)  Updates made by Lynne Logan, RN  since 02/03/2021 12:00 AM     Problem: Disease Progression   Priority: Medium     Long-Range Goal: Disease Progression Prevented or Minimized   Start Date: 08/22/2020  Expected End Date: 08/22/2021  Recent Progress: On track  Priority: Medium  Note:   Current Barriers:  Ineffective Self Health Maintenance Clinical Goal(s):  Collaboration  with Glendale Chard, MD regarding development and update of comprehensive plan of care as evidenced by provider attestation and co-signature Inter-disciplinary care team collaboration (see longitudinal plan of care) patient will work with care management team to address care coordination and chronic disease management needs related to Disease Management Educational Needs Care Coordination Medication Management and Education Psychosocial Support   Interventions:  Evaluation of current treatment plan related to CKD Stage III , self-management and patient's adherence to plan as established by provider. Collaboration with Glendale Chard, MD regarding development and update of comprehensive plan of care as evidenced by provider attestation       and co-signature Inter-disciplinary care team collaboration (see longitudinal plan of care) Provided education to patient about basic CKD disease process Review of patient status, including review of consultants reports, relevant laboratory and other test results, and medications completed. Reviewed medications with patient and discussed importance of medication adherence Reviewed scheduled/upcoming provider appointments including: next PCP follow up appointment scheduled for 11/07/20 @9 :15 AM Mailed printed educational materials related to stages of Chronic Kidney disease; Salt Substitutes for Kidney patients; How to Eat with Kidney disease  Discussed plans with patient for ongoing care management follow up and provided patient with direct contact information for care management team Self Care Activities:  Continue to adhere to MD recommendations for CKD  Continue to keep all scheduled follow up appointments Take medications as directed  Let your healthcare team know if you are unable to take your medications Call your pharmacy for refills at least 7 days prior to running out of medication Increase your water intake unless otherwise directed Review mailed  printed educational materials related to Kidney diease Patient Goals: - to maintain adequate kidney function  Follow Up Plan: Telephone follow up appointment with care management team member scheduled for: 05/05/21     Care Plan : Hypertension (Adult)  Updates made by Lynne Logan, RN since 02/03/2021 12:00 AM     Problem: Hypertension (Hypertension)   Priority: High     Long-Range Goal: Hypertension Monitored   Start Date: 08/22/2020  Expected End Date: 08/22/2021  Recent Progress: On track  Priority: High  Note:   Objective:  Last practice recorded BP readings:  BP Readings from Last 3 Encounters:  01/30/21 (!) 156/91  11/27/20 (!) 142/88  11/13/20 140/62  Current Barriers:  Knowledge Deficits related to basic understanding of hypertension pathophysiology and self care management Knowledge Deficits related to understanding of medications prescribed for management of hypertension Case Manager Clinical Goal(s):  patient will demonstrate improved adherence to prescribed treatment plan for hypertension as evidenced by taking all medications as prescribed, monitoring and recording blood pressure as directed, adhering to low sodium/DASH diet Interventions:  02/03/21 completed successful outbound call to patient  Collaboration with Glendale Chard, MD regarding development and update of comprehensive plan of care as evidenced by provider attestation and co-signature Inter-disciplinary care team collaboration (see longitudinal plan of care) Evaluation of current treatment plan related to hypertension self management and patient's adherence to plan as established by provider. Advised patient, providing education and rationale, to monitor blood pressure daily and record, calling PCP for findings outside  established parameters.  Determined patient is adhering to dietary and exercise recommendations Reviewed and discussed the following Assessment/Plan completed by Dr. Daneen Schick  completed on 11/27/20:    ASSESSMENT:     1. Dizziness   2. Essential hypertension   3. Stage 3a chronic kidney disease ([place])   4. Atherosclerosis of aorta ([place])   5. Malignant neoplasm of prostate ([place])   6. Orthostasis     PLAN:     In order of problems listed above:   Suspect that dizziness and sluggishness after taking his medications in the morning are related to relatively low blood pressure for his age and cardiovascular condition.  Today we demonstrated a nearly 20 point drop in blood pressure systolic with going from sitting to standing.  Following exercise each morning he has some reduction in blood pressure due to vasodilatation plus all of his therapy kicking in lowering blood pressure and likely causing the foggy headed feeling that he has much of the day until later in the evening.  We have decided to switch tamsulosin to nighttime dosing.  We will discontinue Bystolic.  He will continue to monitor his blood pressures.  Target for age 8/80 mmHg.  If sitting blood pressures get too high perhaps Bystolic will be restarted and we may need to require that he wear compression stockings and or decrease the diuretic intensity in the valsartan HCTZ. Target 140/80 mmHg.  Probably overtreated currently.  Bystolic discontinued today and tamsulosin moved to p.m. dosing Being followed by primary care Continue statin therapy with target less than or equal to 70.  Followed by Dr. Baird Cancer. Is present.  May need to compensate by wearing compression stockings.   Medication Adjustments/Labs and Tests Ordered: Current medicines are reviewed at length with the patient today.  Concerns regarding medicines are outlined above.     Orders Placed This Encounter  Procedures   EKG 12-Lead    No orders of the defined types were placed in this encounter.     Patient Instructions  Medication Instructions:  Your physician has recommended you make the following change in your medication:  1-STOP  Bystolic  2-Please take your Tamsulosin at bedtime   *If you need a refill on your cardiac medications before your next appointment, please call your pharmacy*   Lab Work: If you have labs (blood work) drawn today and your tests are completely normal, you will receive your results only by: [place] (if you have MyChart) OR A paper copy in the mail If you have any lab test that is abnormal or we need to change your treatment, we will call you to review the results.   Testing/Procedures: None ordered today.   Follow-Up: At [facility], you and your health needs are our priority.  As part of our continuing mission to provide you with exceptional heart care, we have created designated Provider Care Teams.  These Care Teams include your primary Cardiologist (physician) and Advanced Practice Providers (APPs -  Physician Assistants and Nurse Practitioners) who all work together to provide you with the care you need, when you need it.   We recommend signing up for the patient portal called "MyChart".  Sign up information is provided on this After Visit Summary.  MyChart is used to connect with patients for Virtual Visits (Telemedicine).  Patients are able to view lab/test results, encounter notes, upcoming appointments, etc.  Non-urgent messages can be sent to your provider as well.   To learn more about what you can  do with MyChart, go to [email or web address].     Your next appointment:   2 month(s)   The format for your next appointment:   In Person   Provider:   You may see HTN clinic or one of the following Advanced Practice Providers on your designated Care Team:   Kathyrn Drown, NP   Please monitor your blood pressures at home  Reviewed medications with patient and discussed importance of compliance, confirmed patient verbalizes understanding of recent BP medication changes Encouraged patient to take his time when changing positions and to notify his Cardiologist and or PCP  if symptoms persist or worsen Discussed having patient self monitor his BP/pulse at home daily and recording as directed; Educated on target BP <140/80 as stated by Dr. Tamala Julian  Mailed printed educational materials related to Why Should I Lower Sodium?; 6 Ways to be Water Wise Discussed plans with patient for ongoing care management follow up and provided patient with direct contact information for care management team Self-Care Activities: Self administers medications as prescribed Attends all scheduled provider appointments Calls provider office for new concerns, questions, or BP outside discussed parameters Checks BP and records as discussed Follows a low sodium diet/DASH diet Patient Goals: - check blood pressure 3 times per week - choose a place to take my blood pressure (home, clinic or office, retail store) - write blood pressure results in a log or diary  Follow Up Plan: Telephone follow up appointment with care management team member scheduled for: 05/05/21    Care Plan : Monoclonal gammopathy of unknown significance (MGUS)  Updates made by Lynne Logan, RN since 02/03/2021 12:00 AM     Problem: Monoclonal gammopathy of unknown significance (MGUS)   Priority: Medium     Long-Range Goal: Monoclonal gammopathy of unknown significance (MGUS)   Start Date: 08/22/2020  Expected End Date: 08/22/2021  Recent Progress: On track  Priority: Medium  Note:   Current Barriers:  Ineffective Self Health Maintenance Clinical Goal(s):  Collaboration with Glendale Chard, MD regarding development and update of comprehensive plan of care as evidenced by provider attestation and co-signature Inter-disciplinary care team collaboration (see longitudinal plan of care) patient will work with care management team to address care coordination and chronic disease management needs related to Disease Management Educational Needs Care Coordination Medication Management and Education Psychosocial  Support   Interventions:  Evaluation of current treatment plan related to  MGUS (Monoclonal gammopathy of unknown significance) , self-management and patient's adherence to plan as established by provider. Collaboration with Glendale Chard, MD regarding development and update of comprehensive plan of care as evidenced by provider attestation       and co-signature Inter-disciplinary care team collaboration (see longitudinal plan of care) Review of patient status, including review of consultants reports, relevant laboratory and other test results, and medications completed. Determined patient completed follow up visit with Dr. 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 Pathology showed a 6 cm GI stromal tumor. No vascular or lymphatic invasion. All surgical margins negative. Lymph nodes not sampled. Tumor confined to the submucosa. Mitotic activity 25-50 mitoses per high-power field. He completed 1 year of Gleevec 400 mg daily. Restaging CT abdomen/pelvis 04/10/2014 with no evidence of recurrent/metastatic disease.  CT abdomen/pelvis 05/12/2018 without evidence of local recurrence or metastatic disease. History of right lower extremity DVT June 2018. Evaluation in the emergency department 03/08/2018 for right leg redness, swelling  and tenderness.  Venous Doppler negative for DVT.  Incidental finding of a possible 3.3 cm right inguinal lymph node.  CT abdomen/pelvis 05/12/2018 showed stable small inguinal lymph nodes bilaterally. Chronic renal failure Monoclonal gammopathy of unknown significance 01/09/2019 SPEP with M spike 1.5  07/05/2019 SPEP with M spike 1.9 Hypertension Prostate cancer-on surveillance, followed by urology Disposition: Mr. Liddy appears stable.  He is in clinical remission from the gastrointestinal stromal tumor.  There is no clinical or laboratory  evidence for progression to multiple myeloma.  We will follow up on the myeloma panel from today.  He will return for an office and lab visit in 6 months. Determined patient verbalizes understanding of his prescribed treatment plan and recommendations for ongoing follow up Discussed plans with patient for ongoing care management follow up and provided patient with direct contact information for care management team Self Care Activities:  Continue to keep all scheduled follow up appointments Take medications as directed  Let your healthcare team know if you are unable to take your medications Call your pharmacy for refills at least 7 days prior to running out of medication Patient Goals: - maintain good health and quality of life  Follow Up Plan: Telephone follow up appointment with care management team member scheduled for: 05/05/21     Plan:Telephone follow up appointment with care management team member scheduled for:  05/05/21  Barb Merino, RN, BSN, CCM Care Management Coordinator [place] [facility]  Direct Phone: [phone]

## 2021-02-03 NOTE — Patient Instructions (Signed)
Goals Addressed      Follow My Treatment Plan-Chronic Kidney       Timeframe:  Long-Range Goal Priority:  Medium Start Date: 08/22/20                             Expected End Date: 08/22/21                    Follow Up Date:  05/05/21  Continue to adhere to MD recommendations for CKD  Continue to keep all scheduled follow up appointments Take medications as directed  Let your healthcare team know if you are unable to take your medications Call your pharmacy for refills at least 7 days prior to running out of medication Increase your water intake unless otherwise directed Review mailed printed educational materials related to Kidney diease   Why is this important?   Staying as healthy as you can is very important. This may mean making changes if you smoke, don't exercise or eat poorly.  A healthy lifestyle is an important goal for you.  Following the treatment plan and making changes may be hard.  Try some of these steps to help keep the disease from getting worse.     Notes:      Monitor for disease progression (MGUS) Monoclonal gammopathy of unknown significance       Timeframe:  Long-Range Goal Priority:  Medium Start Date: 08/22/20                            Expected End Date: 08/22/20  Follow Up Date: 05/05/21   Continue to keep all scheduled follow up appointments Take medications as directed  Let your healthcare team know if you are unable to take your medications Call your pharmacy for refills at least 7 days prior to running out of medication                            Track and Manage My Blood Pressure-Hypertension   On track    Timeframe:  Long-Range Goal Priority:  High Start Date: 08/22/20                         Expected End Date: 08/22/21            Follow Up Date: 05/05/21   - check blood pressure 3 times per week - choose a place to take my blood pressure (home, clinic or office, retail store) - write blood pressure results in a log or diary    Why is  this important?   You won't feel high blood pressure, but it can still hurt your blood vessels.  High blood pressure can cause heart or kidney problems. It can also cause a stroke.  Making lifestyle changes like losing a Avyn Aden weight or eating less salt will help.  Checking your blood pressure at home and at different times of the day can help to control blood pressure.  If the doctor prescribes medicine remember to take it the way the doctor ordered.  Call the office if you cannot afford the medicine or if there are questions about it.     Notes:

## 2021-02-04 ENCOUNTER — Telehealth: Payer: Self-pay | Admitting: *Deleted

## 2021-02-04 LAB — PSA: PSA: 10.7

## 2021-02-04 NOTE — Telephone Encounter (Signed)
-----   Message from Ladell Pier, MD sent at 02/03/2021  8:18 PM EDT ----- Please call patient, abnormal protein level is stable, f/u as scheduled

## 2021-02-04 NOTE — Telephone Encounter (Signed)
Left VM that Mspike is still elevated, but stable. F/U as scheduled.

## 2021-02-05 ENCOUNTER — Other Ambulatory Visit: Payer: Self-pay

## 2021-02-05 ENCOUNTER — Ambulatory Visit: Payer: Medicare Other | Admitting: Pharmacist

## 2021-02-05 VITALS — BP 138/70 | HR 51

## 2021-02-05 DIAGNOSIS — I10 Essential (primary) hypertension: Secondary | ICD-10-CM

## 2021-02-05 NOTE — Patient Instructions (Addendum)
It was nice to see you today  Your blood pressure goal is < 140/69mHg  Continue taking your current medications and give [place]aa call with any concerns

## 2021-02-05 NOTE — Progress Notes (Signed)
Patient ID: Lawrence Bailey                 DOB: Mar 13, 1941                      MRN: [id number]     HPI: Lawrence Bailey is a 81 y.o. male referred by Dr. Tamala Julian to HTN clinic. PMH is significant for aortic atherosclerosis, CAD, stroke in 2008 secondary to intracranial hemorrhage from an aneurysm, HTN, and BPH. Reported feeling dizzy/fuzzyheaded in the AM after taking his meds at his last visit with Dr Tamala Julian in July; BP also dropped ~80mHg when pt went from sitting to standing. His tamsulosin was changed to PM dosing and his Bystolic was stopped.  Pt presents today in good spirits. Reports he has been feeling much better after the changes Dr STamala Julianmade at his last visit. Dizziness and fuzzyheaded feeling have resolved. Denies LE edema and dizziness, has headaches regularly that he takes Tylenol for (avoids NSAIDs). Has been dealing with a cold. Saw urology yesterday, no med changes were made. BP at home has been 145/85 at its highest.  Current HTN meds: amlodipine '10mg'$  daily, valsartan-HCTZ 320-'25mg'$  daily - both around 9am  BP goal: <140/849mg per Dr SmTamala Julianased on age and dizziness  Family History: Cancer in his mother; Hypertension in his mother. There is no history of Breast cancer, Prostate cancer, Colon cancer, or Pancreatic cancer.  Social History: Denies drug, alcohol, and tobacco use.  Diet: No caffeine, doesn't add salt to food.  Exercise: Walks 2-3 miles each morning  Wt Readings from Last 3 Encounters:  01/30/21 184 lb (83.5 kg)  11/27/20 186 lb 9.6 oz (84.6 kg)  11/13/20 188 lb 9.6 oz (85.5 kg)   BP Readings from Last 3 Encounters:  01/30/21 (!) 156/91  11/27/20 (!) 142/88  11/13/20 140/62   Pulse Readings from Last 3 Encounters:  01/30/21 61  11/27/20 61  11/13/20 (!) 51    Renal function: Estimated Creatinine Clearance: 50.8 mL/min (by C-G formula based on SCr of 1.22 mg/dL).  Past Medical History:  Diagnosis Date   BPH (benign prostatic hyperplasia)     Coronary artery disease    [place] (gastrointestinal stromal tumor), [place] 04[place]   gleevac comp 04/2008   Glaucoma    Hypertension    Prostate cancer ([place]   Renal insufficiency     Current Outpatient Medications on File Prior to Visit  Medication Sig Dispense Refill   amLODipine (NORVASC) 10 MG tablet Take 10 mg by mouth daily.  3   aspirin 81 MG tablet Take 1 tablet (81 mg total) by mouth daily. 30 tablet    atorvastatin (LIPITOR) 10 MG tablet TAKE 1 TABLET BY MOUTH MONDAY - FRIDAY AT BEDTIME 90 tablet 1   brimonidine (ALPHAGAN) 0.2 % ophthalmic solution 1 drop 2 (two) times daily.     cetirizine (ZYRTEC) 10 MG tablet Take 10 mg by mouth daily as needed for allergies. (Patient not taking: No sig reported)     docusate sodium (COLACE) 100 MG capsule Take 100 mg by mouth 2 (two) times daily.     dorzolamide-timolol (COSOPT) 22.3-6.8 MG/ML ophthalmic solution Place 1 drop into both eyes 2 (two) times daily.  0   fluticasone (FLONASE) 50 MCG/ACT nasal spray INHALE 2 SPRAYS INTO EACH NOSTRIL EVERY NIGHT 16 mL 5   guaiFENesin (MUCINEX) 600 MG 12 hr tablet Take by mouth 2 (two) times daily.  latanoprost (XALATAN) 0.005 % ophthalmic solution Place 1 drop into both eyes at bedtime.     pantoprazole (PROTONIX) 40 MG tablet TAKE 1 TABLET BY MOUTH EVERY DAY 90 tablet 1   sildenafil (REVATIO) 20 MG tablet Take 20 mg by mouth 3 (three) times daily.     tamsulosin (FLOMAX) 0.4 MG CAPS capsule Take 0.4 mg by mouth.     valsartan-hydrochlorothiazide (DIOVAN-HCT) 320-25 MG tablet Take 1 tablet by mouth daily.     No current facility-administered medications on file prior to visit.    Allergies  Allergen Reactions   Other     Bee sting    Sucralfate Other (See Comments)     Assessment/Plan:  1. Hypertension - BP remains at goal <140/79mHg and pt sx of dizziness/fuzzyheadedness noted at his last visit have resolved since Bystolic was d/c'ed and Flomax was moved to PM dosing.  Will continue current antiHTN meds including amlodipine '10mg'$  daily and valsartan-HCTZ 320-'25mg'$  daily. F/u with PharmD as needed.  Gianna Calef E. Taira Knabe, PharmD, BCACP, CNorth [id number]. [id number] [place]ym[place] [id number]: [phone] Fax: [phone]/14/2022 10:55 AM

## 2021-02-17 ENCOUNTER — Encounter: Payer: Self-pay | Admitting: Internal Medicine

## 2021-02-19 ENCOUNTER — Telehealth: Payer: Self-pay

## 2021-02-19 ENCOUNTER — Ambulatory Visit: Payer: Medicare Other | Admitting: Podiatry

## 2021-02-19 NOTE — Chronic Care Management (AMB) (Signed)
Chronic Care Management Pharmacy Assistant   Name: Lawrence Bailey  MRN: [id number] DOB: 12-Dec-1940   Reason for Encounter: Disease State/ Hypertension    Recent office visits:  02-03-2021 Lynne Logan, RN (CCM)  Recent consult visits:  01-30-2021 Ladell Pier, MD (Oncology). Follow up in 1 year  Hospital visits:  None in previous 6 months  Medications: Outpatient Encounter Medications as of 02/19/2021  Medication Sig   amLODipine (NORVASC) 10 MG tablet Take 10 mg by mouth daily.   aspirin 81 MG tablet Take 1 tablet (81 mg total) by mouth daily.   atorvastatin (LIPITOR) 10 MG tablet TAKE 1 TABLET BY MOUTH MONDAY - FRIDAY AT BEDTIME   brimonidine (ALPHAGAN) 0.2 % ophthalmic solution 1 drop 2 (two) times daily.   docusate sodium (COLACE) 100 MG capsule Take 100 mg by mouth 2 (two) times daily.   dorzolamide-timolol (COSOPT) 22.3-6.8 MG/ML ophthalmic solution Place 1 drop into both eyes 2 (two) times daily.   fluticasone (FLONASE) 50 MCG/ACT nasal spray INHALE 2 SPRAYS INTO EACH NOSTRIL EVERY NIGHT   guaiFENesin (MUCINEX) 600 MG 12 hr tablet Take by mouth 2 (two) times daily.   latanoprost (XALATAN) 0.005 % ophthalmic solution Place 1 drop into both eyes at bedtime.   pantoprazole (PROTONIX) 40 MG tablet TAKE 1 TABLET BY MOUTH EVERY DAY   sildenafil (REVATIO) 20 MG tablet Take 20 mg by mouth 3 (three) times daily.   tamsulosin (FLOMAX) 0.4 MG CAPS capsule Take 0.4 mg by mouth.   valsartan-hydrochlorothiazide (DIOVAN-HCT) 320-25 MG tablet Take 1 tablet by mouth daily.   No facility-administered encounter medications on file as of 02/19/2021.   Reviewed chart prior to disease state call. Spoke with patient regarding BP  Recent Office Vitals: BP Readings from Last 3 Encounters:  02/05/21 138/70  01/30/21 (!) 156/91  11/27/20 (!) 142/88   Pulse Readings from Last 3 Encounters:  02/05/21 (!) 51  01/30/21 61  11/27/20 61    Wt Readings from Last 3 Encounters:   01/30/21 184 lb (83.5 kg)  11/27/20 186 lb 9.6 oz (84.6 kg)  11/13/20 188 lb 9.6 oz (85.5 kg)     Kidney Function Lab Results  Component Value Date/Time   CREATININE 1.22 01/30/2021 10:12 AM   CREATININE 1.44 (H) 07/30/2020 09:21 AM   CREATININE 1.5 (H) 04/10/2014 09:46 AM   CREATININE 1.8 (H) 04/14/2013 09:14 AM   GFRNONAA 60 (L) 01/30/2021 10:12 AM   GFRAA 62 07/09/2020 03:30 PM   GFRAA >60 02/01/2020 09:24 AM    BMP Latest Ref Rng & Units 01/30/2021 07/30/2020 07/09/2020  Glucose 70 - 99 mg/dL 78 110(H) 103(H)  BUN 8 - 23 mg/dL 21 22 26   Creatinine 0.61 - 1.24 mg/dL 1.22 1.44(H) 1.26  BUN/Creat Ratio 10 - 24 - - 21  Sodium 135 - 145 mmol/L 135 137 141  Potassium 3.5 - 5.1 mmol/L 4.2 3.4(L) 4.4  Chloride 98 - 111 mmol/L 100 103 102  CO2 22 - 32 mmol/L 30 29 24   Calcium 8.9 - 10.3 mg/dL 9.5 9.2 9.6    Current antihypertensive regimen:  Amlodipine 10 mg daily Valsartan - Hydrochlorothiazide 320-25 mg daily  How often are you checking your Blood Pressure? 3-5x per week  Current home BP readings: 138/77  What recent interventions/DTPs have been made by any provider to improve Blood Pressure control since last CPP Visit:  Educated on Importance of home blood pressure monitoring; Proper BP monitoring technique Counseled to monitor BP at home  at least 5 times per day, document, and provide log at future appointments  Any recent hospitalizations or ED visits since last visit with CPP? No  What diet changes have been made to improve Blood Pressure Control?  Patient states he's eliminated salty food intake, uses the air fryer and doesn't eat out a lot.  What exercise is being done to improve your Blood Pressure Control?  Patient states he mows the lawn, walks and is very active during the day.   Adherence Review: Is the patient currently on ACE/ARB medication? Yes Does the patient have >5 day gap between last estimated fill dates? No   Care Gaps: Shingrix overdue Flu  vaccine overdue last completed 07-09-2020 Covid booster overdue last completed 09-22-2020  Star Rating Drugs: Atorvastatin 10 mg- Last filled 01-30-2021 [facility] Valsartan-Hydrochlorothiazide 320-25 mg- Last filled 01-15-2021 [facility]  [place] Clinical Pharmacist Assistant [phone]

## 2021-02-21 DIAGNOSIS — I129 Hypertensive chronic kidney disease with stage 1 through stage 4 chronic kidney disease, or unspecified chronic kidney disease: Secondary | ICD-10-CM

## 2021-02-21 DIAGNOSIS — N1831 Chronic kidney disease, stage 3a: Secondary | ICD-10-CM

## 2021-03-03 ENCOUNTER — Encounter: Payer: Self-pay | Admitting: Internal Medicine

## 2021-03-27 ENCOUNTER — Telehealth: Payer: Self-pay

## 2021-03-27 NOTE — Chronic Care Management (AMB) (Signed)
Chronic Care Management Pharmacy Assistant   Name: Lawrence Bailey  MRN: [id number] DOB: [birth year]  Reason for Encounter: Disease State/ Hypertension   Recent office visits:  None  Recent consult visits:  None  Hospital visits:  None in previous 6 months  Medications: Outpatient Encounter Medications as of 03/27/2021  Medication Sig   amLODipine (NORVASC) 10 MG tablet Take 10 mg by mouth daily.   aspirin 81 MG tablet Take 1 tablet (81 mg total) by mouth daily.   atorvastatin (LIPITOR) 10 MG tablet TAKE 1 TABLET BY MOUTH MONDAY - FRIDAY AT BEDTIME   brimonidine (ALPHAGAN) 0.2 % ophthalmic solution 1 drop 2 (two) times daily.   docusate sodium (COLACE) 100 MG capsule Take 100 mg by mouth 2 (two) times daily.   dorzolamide-timolol (COSOPT) 22.3-6.8 MG/ML ophthalmic solution Place 1 drop into both eyes 2 (two) times daily.   fluticasone (FLONASE) 50 MCG/ACT nasal spray INHALE 2 SPRAYS INTO EACH NOSTRIL EVERY NIGHT   guaiFENesin (MUCINEX) 600 MG 12 hr tablet Take by mouth 2 (two) times daily.   latanoprost (XALATAN) 0.005 % ophthalmic solution Place 1 drop into both eyes at bedtime.   pantoprazole (PROTONIX) 40 MG tablet TAKE 1 TABLET BY MOUTH EVERY DAY   sildenafil (REVATIO) 20 MG tablet Take 20 mg by mouth 3 (three) times daily.   tamsulosin (FLOMAX) 0.4 MG CAPS capsule Take 0.4 mg by mouth.   valsartan-hydrochlorothiazide (DIOVAN-HCT) 320-25 MG tablet Take 1 tablet by mouth daily.   No facility-administered encounter medications on file as of 03/27/2021.  Reviewed chart prior to disease state call. Spoke with patient regarding BP  Recent Office Vitals: BP Readings from Last 3 Encounters:  02/05/21 138/70  01/30/21 (!) 156/91  11/27/20 (!) 142/88   Pulse Readings from Last 3 Encounters:  02/05/21 (!) 51  01/30/21 61  11/27/20 61    Wt Readings from Last 3 Encounters:  01/30/21 184 lb (83.5 kg)  11/27/20 186 lb 9.6 oz (84.6 kg)  11/13/20 188 lb 9.6 oz (85.5 kg)      Kidney Function Lab Results  Component Value Date/Time   CREATININE 1.22 01/30/2021 10:12 AM   CREATININE 1.44 (H) 07/30/2020 09:21 AM   CREATININE 1.5 (H) 04/10/2014 09:46 AM   CREATININE 1.8 (H) 04/14/2013 09:14 AM   GFRNONAA 60 (L) 01/30/2021 10:12 AM   GFRAA 62 07/09/2020 03:30 PM   GFRAA >60 02/01/2020 09:24 AM    BMP Latest Ref Rng & Units 01/30/2021 07/30/2020 07/09/2020  Glucose 70 - 99 mg/dL 78 110(H) 103(H)  BUN 8 - 23 mg/dL 21 22 26   Creatinine 0.61 - 1.24 mg/dL 1.22 1.44(H) 1.26  BUN/Creat Ratio 10 - 24 - - 21  Sodium 135 - 145 mmol/L 135 137 141  Potassium 3.5 - 5.1 mmol/L 4.2 3.4(L) 4.4  Chloride 98 - 111 mmol/L 100 103 102  CO2 22 - 32 mmol/L 30 29 24   Calcium 8.9 - 10.3 mg/dL 9.5 9.2 9.6    Current antihypertensive regimen:  Amlodipine 10 mg daily Valsartan/HCTZ 320-25 mg daily  How often are you checking your Blood Pressure? 1-2x per week  Current home BP readings: 139/70  What recent interventions/DTPs have been made by any provider to improve Blood Pressure control since last CPP Visit:  Educated on Importance of home blood pressure monitoring; Proper BP monitoring technique Counseled to monitor BP at home at least 5 times per day, document, and provide log at future appointments  Any recent hospitalizations or ED visits  since last visit with CPP? No  What diet changes have been made to improve Blood Pressure Control?  Patient states he's eliminated salty food intake, uses the air fryer and doesn't eat out a lot.  What exercise is being done to improve your Blood Pressure Control?  Patient states he mows the lawn, walks and is very active during the day.  Adherence Review: Is the patient currently on ACE/ARB medication? Yes Does the patient have >5 day gap between last estimated fill dates? No   Care Gaps: PNA Vac overdue Shingrix overdue Covid booster overdue last completed 09-22-2020 Flu vaccine overdue last completed 07-09-2020 [facility]  11-20-2021  Star Rating Drugs: Atorvastatin 10 mg- Last filled 01-30-2021 [facility] Valsartan-Hydrochlorothiazide 320-25 mg- Last filled 01-15-2021 [facility]  [place] Clinical Pharmacist Assistant [phone]

## 2021-05-05 ENCOUNTER — Ambulatory Visit: Payer: Self-pay

## 2021-05-05 ENCOUNTER — Telehealth: Payer: Medicare Other

## 2021-05-05 DIAGNOSIS — D472 Monoclonal gammopathy: Secondary | ICD-10-CM

## 2021-05-05 DIAGNOSIS — I700 Atherosclerosis of aorta: Secondary | ICD-10-CM

## 2021-05-05 DIAGNOSIS — N1831 Chronic kidney disease, stage 3a: Secondary | ICD-10-CM

## 2021-05-05 DIAGNOSIS — I129 Hypertensive chronic kidney disease with stage 1 through stage 4 chronic kidney disease, or unspecified chronic kidney disease: Secondary | ICD-10-CM

## 2021-05-05 NOTE — Chronic Care Management (AMB) (Signed)
. Chronic Care Management   CCM RN Visit Note  05/05/2021 Name: Lawrence Bailey MRN: [id number] DOB: 1941/03/30  Subjective: Lawrence Bailey is a 80 y.o. year old male who is a primary care patient of Glendale Chard, MD. The care management team was consulted for assistance with disease management and care coordination needs.    Engaged with patient by telephone for follow up visit in response to provider referral for case management and/or care coordination services.   Consent to Services:  The patient was given information about Chronic Care Management services, agreed to services, and gave verbal consent prior to initiation of services.  Please see initial visit note for detailed documentation.   Patient agreed to services and verbal consent obtained.   Assessment: Review of patient past medical history, allergies, medications, health status, including review of consultants reports, laboratory and other test data, was performed as part of comprehensive evaluation and provision of chronic care management services.   SDOH (Social Determinants of Health) assessments and interventions performed:  Yes, no acute challenges   CCM Care Plan  Allergies  Allergen Reactions   Other     Bee sting    Sucralfate Other (See Comments)    Outpatient Encounter Medications as of 05/05/2021  Medication Sig   amLODipine (NORVASC) 10 MG tablet Take 10 mg by mouth daily.   aspirin 81 MG tablet Take 1 tablet (81 mg total) by mouth daily.   atorvastatin (LIPITOR) 10 MG tablet TAKE 1 TABLET BY MOUTH MONDAY - FRIDAY AT BEDTIME   brimonidine (ALPHAGAN) 0.2 % ophthalmic solution 1 drop 2 (two) times daily.   docusate sodium (COLACE) 100 MG capsule Take 100 mg by mouth 2 (two) times daily.   dorzolamide-timolol (COSOPT) 22.3-6.8 MG/ML ophthalmic solution Place 1 drop into both eyes 2 (two) times daily.   fluticasone (FLONASE) 50 MCG/ACT nasal spray INHALE 2 SPRAYS INTO EACH NOSTRIL EVERY NIGHT    guaiFENesin (MUCINEX) 600 MG 12 hr tablet Take by mouth 2 (two) times daily.   latanoprost (XALATAN) 0.005 % ophthalmic solution Place 1 drop into both eyes at bedtime.   pantoprazole (PROTONIX) 40 MG tablet TAKE 1 TABLET BY MOUTH EVERY DAY   sildenafil (REVATIO) 20 MG tablet Take 20 mg by mouth 3 (three) times daily.   tamsulosin (FLOMAX) 0.4 MG CAPS capsule Take 0.4 mg by mouth.   valsartan-hydrochlorothiazide (DIOVAN-HCT) 320-25 MG tablet Take 1 tablet by mouth daily.   No facility-administered encounter medications on file as of 05/05/2021.    Patient Active Problem List   Diagnosis Date Noted   Chest pain 08/28/2020   Constipation 08/28/2020   Cough 08/28/2020   Dysphagia 08/28/2020   Hemorrhage of rectum and anus 08/28/2020   Atherosclerosis of aorta ([place]) 07/09/2020   Hypertensive nephropathy 05/02/2020   Gastric stromal tumor ([place]) 11/01/2019   Malignant neoplasm of prostate ([place]) 07/11/2019   Glaucoma 02/28/2018   Syncope and collapse    Syncope 11/08/2016   Stage 3 chronic kidney disease ([place]) 11/08/2016   Essential hypertension 11/08/2016   Hyperglycemia 11/08/2016   Pulmonary contusion 02/04/2012   Thigh hematoma 02/04/2012   H/O malignant gastrointestinal stromal tumor ([facility]) 07/20/2011    Conditions to be addressed/monitored: Hypertensive nephropathy, Atherosclerosis of aorta,  Stage 3a chronic kidney disease, Monoclonal gammopathy of unknown significance  Care Plan : RN Care Manager Plan of Care  Updates made by Lynne Logan, RN since 05/05/2021 12:00 AM     Problem: No plan of care established for  management of chronic disease states (Hypertensive nephropathy, Atherosclerosis of aorta,  Stage 3a chronic kidney disease, Monoclonal gammopathy of unknown significance)   Priority: High     Long-Range Goal: Development of plan of care for chronic disease management for (Hypertensive nephropathy, Atherosclerosis of aorta,  Stage 3a chronic kidney disease,  Monoclonal gammopathy of unknown significance)   Start Date: 05/05/2021  Expected End Date: 05/05/2022  This Visit's Progress: On track  Priority: High  Note:   Current Barriers:  Knowledge Deficits related to plan of care for management of Hypertensive nephropathy, Atherosclerosis of aorta,  Stage 3a chronic kidney disease, Monoclonal gammopathy of unknown significance  Chronic Disease Management support and education needs related to Hypertensive nephropathy, Atherosclerosis of aorta,  Stage 3a chronic kidney disease, Monoclonal gammopathy of unknown significance   RNCM Clinical Goal(s):  Patient will verbalize basic understanding of  Hypertensive nephropathy, Atherosclerosis of aorta,  Stage 3a chronic kidney disease, Monoclonal gammopathy of unknown significance disease process and self health management plan as evidenced by patient will report having no disease exacerbations related to his chronic disease states listed above take all medications exactly as prescribed and will call provider for medication related questions as evidenced by patient will report having no missed doses of his prescribed medications  demonstrate Ongoing health management independence as evidenced by patient will report 100% adherence to his prescribed treatment plan  continue to work with RN Care Manager to address care management and care coordination needs related to  Hypertensive nephropathy, Atherosclerosis of aorta,  Stage 3a chronic kidney disease, Monoclonal gammopathy of unknown significance as evidenced by adherence to CM Team Scheduled appointments demonstrate ongoing self health care management ability   as evidenced by    through collaboration with RN Care manager, provider, and care team.   Interventions: 1:1 collaboration with primary care provider regarding development and update of comprehensive plan of care as evidenced by provider attestation and co-signature Inter-disciplinary care team  collaboration (see longitudinal plan of care) Evaluation of current treatment plan related to  self management and patient's adherence to plan as established by provider   Chronic Kidney Disease Interventions:  (Status:  Goal on track:  Yes.) Long Term Goal Evaluation of current treatment plan related to chronic kidney disease self management and patient's adherence to plan as established by provider      Reviewed prescribed diet increase water to 3-4 bottles per day (around 64 oz) Provided education on kidney disease progression    Discussed plans with patient for ongoing care management follow up and provided patient with direct contact information for care management team Last practice recorded BP readings:  BP Readings from Last 3 Encounters:  02/05/21 138/70  01/30/21 (!) 156/91  11/27/20 (!) 142/88  Most recent eGFR/CrCl: No results found for: EGFR  No components found for: CRCL  Monoclonal gammopathy of unknown significance (MGUS):  (Status:  Condition stable.  Not addressed this visit.)  Long Term Goal Evaluation of current treatment plan related to  Monoclonal gammopathy of unknown significance (MGUS) , self-management and patient's adherence to plan as established by provider. Evaluation of current treatment plan related to MGUS (Monoclonal gammopathy of unknown significance), self-management and patient's adherence to plan as established by provider. Collaboration with Glendale Chard, MD regarding development and update of comprehensive plan of care as evidenced by provider attestation       and co-signature Inter-disciplinary care team collaboration (see longitudinal plan of care) Review of patient status, including review of consultants reports, relevant laboratory  and other test results, and medications completed. Determined patient completed follow up visit with Dr. 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 Pathology showed a 6 cm GI stromal tumor. No vascular or lymphatic invasion. All surgical margins negative. Lymph nodes not sampled. Tumor confined to the submucosa. Mitotic activity 25-50 mitoses per high-power field. He completed 1 year of Gleevec 400 mg daily. Restaging CT abdomen/pelvis 04/10/2014 with no evidence of recurrent/metastatic disease.  CT abdomen/pelvis 05/12/2018 without evidence of local recurrence or metastatic disease. History of right lower extremity DVT June 2018. Evaluation in the emergency department 03/08/2018 for right leg redness, swelling and tenderness.  Venous Doppler negative for DVT.  Incidental finding of a possible 3.3 cm right inguinal lymph node.  CT abdomen/pelvis 05/12/2018 showed stable small inguinal lymph nodes bilaterally. Chronic renal failure Monoclonal gammopathy of unknown significance 01/09/2019 SPEP with M spike 1.5  07/05/2019 SPEP with M spike 1.9 Hypertension Prostate cancer-on surveillance, followed by urology Disposition: Mr. Sheley appears stable.  He is in clinical remission from the gastrointestinal stromal tumor.  There is no clinical or laboratory evidence for progression to multiple myeloma.  We will follow up on the myeloma panel from today.  He will return for an office and lab visit in 6 months. Determined patient verbalizes understanding of his prescribed treatment plan and recommendations for ongoing follow up Discussed plans with patient for ongoing care management follow up and provided patient with direct contact information for care management team  Hypertension Interventions:  (Status:  Goal on track:  Yes.) Long Term Goal Last practice recorded BP readings:  BP Readings from Last 3 Encounters:  02/05/21 138/70  01/30/21 (!) 156/91  11/27/20 (!) 142/88  Most recent eGFR/CrCl: No results found for: EGFR  No components found for: CRCL  Evaluation of current  treatment plan related to hypertension self management and patient's adherence to plan as established by provider Reviewed medications with patient and discussed importance of compliance Advised patient, providing education and rationale, to monitor blood pressure daily and record, calling PCP for findings outside established parameters Provided education on prescribed diet low Sodium Discussed complications of poorly controlled blood pressure such as heart disease, stroke, circulatory complications, vision complications, kidney impairment, sexual dysfunction Assessed social determinant of health barriers Discussed plans with patient for ongoing care management follow up and provided patient with direct contact information for care management team  Patient Goals/Self-Care Activities: Take all medications as prescribed Attend all scheduled provider appointments Call pharmacy for medication refills 3-7 days in advance of running out of medications Perform all self care activities independently  Call provider office for new concerns or questions  check blood pressure weekly keep a blood pressure log call doctor for signs and symptoms of high blood pressure take medications for blood pressure exactly as prescribed  Follow Up Plan:  Telephone follow up appointment with care management team member scheduled for:  07/07/21       Plan:Telephone follow up appointment with care management team member scheduled for:  07/07/21  Barb Merino, RN, BSN, CCM Care Management Coordinator [facility] [facility]  Direct Phone: [phone]

## 2021-05-05 NOTE — Patient Instructions (Signed)
Visit Information   Thank you for taking time to visit with me today. Please don't hesitate to contact me if I can be of assistance to you before our next scheduled telephone appointment.  Following are the goals we discussed today:  (Copy and paste patient goals from clinical care plan here)  Our next appointment is by telephone on 07/07/21 at 10:20 AM  Please call the care guide team at [phone] if you need to cancel or reschedule your appointment.   If you are experiencing a Mental Health or [place] or need someone to talk to, please call [phone]-TALK (toll free, 24 hour hotline)   Following is a copy of your full care plan:  Care Plan : [place] of Care  Updates made by Lynne Logan, RN since 05/05/2021 12:00 AM     Problem: No plan of care established for management of chronic disease states (Hypertensive nephropathy, Atherosclerosis of aorta,  Stage 3a chronic kidney disease, Monoclonal gammopathy of unknown significance)   Priority: High     Long-Range Goal: Development of plan of care for chronic disease management for (Hypertensive nephropathy, Atherosclerosis of aorta,  Stage 3a chronic kidney disease, Monoclonal gammopathy of unknown significance)   Start Date: 05/05/2021  Expected End Date: 05/05/2022  This Visit's Progress: On track  Priority: High  Note:   Current Barriers:  Knowledge Deficits related to plan of care for management of Hypertensive nephropathy, Atherosclerosis of aorta,  Stage 3a chronic kidney disease, Monoclonal gammopathy of unknown significance  Chronic Disease Management support and education needs related to Hypertensive nephropathy, Atherosclerosis of aorta,  Stage 3a chronic kidney disease, Monoclonal gammopathy of unknown significance   RNCM Clinical Goal(s):  Patient will verbalize basic understanding of  Hypertensive nephropathy, Atherosclerosis of aorta,  Stage 3a chronic kidney disease, Monoclonal  gammopathy of unknown significance disease process and self health management plan as evidenced by patient will report having no disease exacerbations related to his chronic disease states listed above take all medications exactly as prescribed and will call provider for medication related questions as evidenced by patient will report having no missed doses of his prescribed medications  demonstrate Ongoing health management independence as evidenced by patient will report 100% adherence to his prescribed treatment plan  continue to work with RN Care Manager to address care management and care coordination needs related to  Hypertensive nephropathy, Atherosclerosis of aorta,  Stage 3a chronic kidney disease, Monoclonal gammopathy of unknown significance as evidenced by adherence to CM Team Scheduled appointments demonstrate ongoing self health care management ability   as evidenced by    through collaboration with RN Care manager, provider, and care team.   Interventions: 1:1 collaboration with primary care provider regarding development and update of comprehensive plan of care as evidenced by provider attestation and co-signature Inter-disciplinary care team collaboration (see longitudinal plan of care) Evaluation of current treatment plan related to  self management and patient's adherence to plan as established by provider    Chronic Kidney Disease Interventions:  (Status:  Goal on track:  Yes.) Long Term Goal Evaluation of current treatment plan related to chronic kidney disease self management and patient's adherence to plan as established by provider      Reviewed prescribed diet increase water to 3-4 bottles per day (around 64 oz) Provided education on kidney disease progression    Discussed plans with patient for ongoing care management follow up and provided patient with direct contact information for care management team  Last practice recorded BP readings:  BP Readings from Last 3  Encounters:  02/05/21 138/70  01/30/21 (!) 156/91  11/27/20 (!) 142/88  Most recent eGFR/CrCl: No results found for: EGFR  No components found for: CRCL    Monoclonal gammopathy of unknown significance (MGUS):  (Status:  Condition stable.  Not addressed this visit.)  Long Term Goal Evaluation of current treatment plan related to  Monoclonal gammopathy of unknown significance (MGUS) , self-management and patient's adherence to plan as established by provider. Evaluation of current treatment plan related to MGUS (Monoclonal gammopathy of unknown significance), self-management and patient's adherence to plan as established by provider. Collaboration with Glendale Chard, MD regarding development and update of comprehensive plan of care as evidenced by provider attestation       and co-signature Inter-disciplinary care team collaboration (see longitudinal plan of care) Review of patient status, including review of consultants reports, relevant laboratory and other test results, and medications completed. Determined patient completed follow up visit with Dr. 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 Pathology showed a 6 cm GI stromal tumor. No vascular or lymphatic invasion. All surgical margins negative. Lymph nodes not sampled. Tumor confined to the submucosa. Mitotic activity 25-50 mitoses per high-power field. He completed 1 year of Gleevec 400 mg daily. Restaging CT abdomen/pelvis 04/10/2014 with no evidence of recurrent/metastatic disease.  CT abdomen/pelvis 05/12/2018 without evidence of local recurrence or metastatic disease. History of right lower extremity DVT June 2018. Evaluation in the emergency department 03/08/2018 for right leg redness, swelling and tenderness.  Venous Doppler negative for DVT.  Incidental finding of a possible 3.3 cm right inguinal lymph  node.  CT abdomen/pelvis 05/12/2018 showed stable small inguinal lymph nodes bilaterally. Chronic renal failure Monoclonal gammopathy of unknown significance 01/09/2019 SPEP with M spike 1.5  07/05/2019 SPEP with M spike 1.9 Hypertension Prostate cancer-on surveillance, followed by urology Disposition: Mr. Wildey appears stable.  He is in clinical remission from the gastrointestinal stromal tumor.  There is no clinical or laboratory evidence for progression to multiple myeloma.  We will follow up on the myeloma panel from today.  He will return for an office and lab visit in 6 months. Determined patient verbalizes understanding of his prescribed treatment plan and recommendations for ongoing follow up Discussed plans with patient for ongoing care management follow up and provided patient with direct contact information for care management team  Hypertension Interventions:  (Status:  Goal on track:  Yes.) Long Term Goal Last practice recorded BP readings:  BP Readings from Last 3 Encounters:  02/05/21 138/70  01/30/21 (!) 156/91  11/27/20 (!) 142/88  Most recent eGFR/CrCl: No results found for: EGFR  No components found for: CRCL  Evaluation of current treatment plan related to hypertension self management and patient's adherence to plan as established by provider Reviewed medications with patient and discussed importance of compliance Advised patient, providing education and rationale, to monitor blood pressure daily and record, calling PCP for findings outside established parameters Provided education on prescribed diet low Sodium Discussed complications of poorly controlled blood pressure such as heart disease, stroke, circulatory complications, vision complications, kidney impairment, sexual dysfunction Assessed social determinant of health barriers Discussed plans with patient for ongoing care management follow up and provided patient with direct contact information for care management  team  Patient Goals/Self-Care Activities: Take all medications as prescribed Attend all scheduled provider appointments Call pharmacy  for medication refills 3-7 days in advance of running out of medications Perform all self care activities independently  Call provider office for new concerns or questions  check blood pressure weekly keep a blood pressure log call doctor for signs and symptoms of high blood pressure take medications for blood pressure exactly as prescribed  Follow Up Plan:  Telephone follow up appointment with care management team member scheduled for:  07/07/21        Consent to CCM Services: Mr. Levitt was given information about Chronic Care Management services including:  CCM service includes personalized support from designated clinical staff supervised by his physician, including individualized plan of care and coordination with other care providers 24/7 contact phone numbers for assistance for urgent and routine care needs. Service will only be billed when office clinical staff spend 20 minutes or more in a month to coordinate care. Only one practitioner may furnish and bill the service in a calendar month. The patient may stop CCM services at any time (effective at the end of the month) by phone call to the office staff. The patient will be responsible for cost sharing (co-pay) of up to 20% of the service fee (after annual deductible is met).  Patient agreed to services and verbal consent obtained.   Patient verbalizes understanding of instructions provided today and agrees to view in [place].   Telephone follow up appointment with care management team member scheduled for: 07/07/21

## 2021-05-23 ENCOUNTER — Telehealth: Payer: Self-pay

## 2021-05-23 NOTE — Progress Notes (Signed)
° ° °  Chronic Care Management Pharmacy Assistant   Name: SALADIN PETRELLI  MRN: [id number] DOB: 1940-12-18   Reason for Encounter: Rescheduled patient appointment from 07/03/21 to 07/04/21. Provider schedule changed.Called patient left message          [place] Pharmacist Assistant [phone]

## 2021-06-30 ENCOUNTER — Telehealth: Payer: Self-pay

## 2021-06-30 NOTE — Chronic Care Management (AMB) (Signed)
° ° °  Called Lawrence Bailey, No answer, left message of appointment on 07-04-2021 at 10:15 via telephone visit with Orlando Penner, Pharm D. Notified to have all medications, supplements, blood pressure and/or blood sugar logs available during appointment and to return call if need to reschedule  Care Gaps: PNA Vac overdue Shingrix overdue Covid booster overdue last completed 09-22-2020 Flu vaccine overdue last completed 07-09-2020 AWV 11-20-2021  Star Rating Drug: Atorvastatin 10 mg- Last filled 05-10-2021 [facility] Valsartan-Hydrochlorothiazide 320-25 mg- Last filled 05-21-2021 [facility]  Any gaps in medications fill history? No  [place] Pharmacist Assistant [phone]

## 2021-07-03 ENCOUNTER — Telehealth: Payer: Self-pay

## 2021-07-04 ENCOUNTER — Ambulatory Visit (INDEPENDENT_AMBULATORY_CARE_PROVIDER_SITE_OTHER): Payer: No Typology Code available for payment source

## 2021-07-04 DIAGNOSIS — I700 Atherosclerosis of aorta: Secondary | ICD-10-CM

## 2021-07-04 DIAGNOSIS — I10 Essential (primary) hypertension: Secondary | ICD-10-CM

## 2021-07-04 NOTE — Progress Notes (Signed)
Chronic Care Management Pharmacy Note  07/15/2021 Name:  Lawrence Bailey MRN:  [id number] DOB:  03-07-1941  Summary: Patient reports that he is taking his medication everyday.   Recommendations/Changes made from today's visit: Recommend patient receive COVID-19 booster vaccine, pneumonia vaccine and shingrix vaccine. Recommend patient have lipid panel completed during next office visit.   Plan: Patient reports that he is going to get the vaccines completed at his local pharmacy.    Subjective: Lawrence Bailey is an 81 y.o. year old male who is a primary patient of Glendale Chard, MD.  The CCM team was consulted for assistance with disease management and care coordination needs.    Engaged with patient by telephone for follow up visit in response to provider referral for pharmacy case management and/or care coordination services.   Consent to Services:  The patient was given information about Chronic Care Management services, agreed to services, and gave verbal consent prior to initiation of services.  Please see initial visit note for detailed documentation.   Patient Care Team: Glendale Chard, MD as PCP - General (Internal Medicine) Johnathan Hausen, MD as Consulting Physician (General Surgery) Carol Ada, MD as Consulting Physician (Gastroenterology) Charolette Forward, MD as Consulting Physician (Cardiology) Warden Fillers, MD as Consulting Physician (Ophthalmology) Daneen Schick as Social Worker [place], Claudette Stapler, RN as [place], Sharyn Blitz, [facility] (Pharmacist)  Recent office visits: 02/21/2021 PCP OV  Recent consult visits: 02/27/2021 Urology OV 02/05/2021 Cardiology [facility] visits: None in previous 6 months   Objective:  Lab Results  Component Value Date   CREATININE 1.22 01/30/2021   BUN 21 01/30/2021   GFRNONAA 60 (L) 01/30/2021   GFRAA 62 07/09/2020   NA 135 01/30/2021   K 4.2 01/30/2021   CALCIUM 9.5 01/30/2021    CO2 30 01/30/2021   GLUCOSE 78 01/30/2021    Lab Results  Component Value Date/Time   HGBA1C 5.6 07/09/2020 03:30 PM   HGBA1C 5.6 06/15/2018 03:03 PM   MICROALBUR 30 11/01/2019 05:21 PM    Last diabetic Eye exam: No results found for: HMDIABEYEEXA  Last diabetic Foot exam: No results found for: HMDIABFOOTEX   Lab Results  Component Value Date   CHOL 154 05/02/2020   HDL 57 05/02/2020   LDLCALC 84 05/02/2020   TRIG 65 05/02/2020   CHOLHDL 2.7 05/02/2020    Hepatic Function Latest Ref Rng & Units 01/30/2021 07/30/2020 02/01/2020  Total Protein 6.5 - 8.1 g/dL 8.3(H) 8.3(H) 8.4(H)  Albumin 3.5 - 5.0 g/dL 3.9 3.8 3.8  AST 15 - 41 U/L 16 16 17   ALT 0 - 44 U/L 13 13 9   Alk Phosphatase 38 - 126 U/L 44 50 49  Total Bilirubin 0.3 - 1.2 mg/dL 0.6 0.6 0.8    Lab Results  Component Value Date/Time   TSH 0.969 11/08/2016 10:42 AM    CBC Latest Ref Rng & Units 01/30/2021 07/30/2020 07/09/2020  WBC 4.0 - 10.5 K/uL 6.1 4.3 4.0  Hemoglobin 13.0 - 17.0 g/dL 11.7(L) 11.9(L) 12.2(L)  Hematocrit 39.0 - 52.0 % 38.0(L) 38.5(L) 36.6(L)  Platelets 150 - 400 K/uL 145(L) 165 258    Clinical ASCVD: Yes  The ASCVD Risk score (Arnett DK, et al., 2019) failed to calculate for the following reasons:   The 2019 ASCVD risk score is only valid for ages 5 to 77    Depression screen PHQ 2/9 11/13/2020 11/01/2019 11/01/2019  Decreased Interest 0 0 0  Down, Depressed, Hopeless 0  0 0  PHQ - 2 Score 0 0 0  Altered sleeping - 0 -  Tired, decreased energy - 0 -  Change in appetite - 0 -  Feeling bad or failure about yourself  - 0 -  Trouble concentrating - 0 -  Moving slowly or fidgety/restless - 0 -  Suicidal thoughts - - -  PHQ-9 Score - 0 -  Difficult doing work/chores - Not difficult at all -     Social History   Tobacco Use  Smoking Status Never  Smokeless Tobacco Never   BP Readings from Last 3 Encounters:  02/05/21 138/70  01/30/21 (!) 156/91  11/27/20 (!) 142/88   Pulse Readings from Last 3  Encounters:  02/05/21 (!) 51  01/30/21 61  11/27/20 61   Wt Readings from Last 3 Encounters:  01/30/21 184 lb (83.5 kg)  11/27/20 186 lb 9.6 oz (84.6 kg)  11/13/20 188 lb 9.6 oz (85.5 kg)   BMI Readings from Last 3 Encounters:  01/30/21 27.98 kg/m  11/27/20 28.37 kg/m  11/13/20 27.69 kg/m    Assessment/Interventions: Review of patient past medical history, allergies, medications, health status, including review of consultants reports, laboratory and other test data, was performed as part of comprehensive evaluation and provision of chronic care management services.   SDOH:  (Social Determinants of Health) assessments and interventions performed: No  SDOH Screenings   Alcohol Screen: Not on file  Depression (PHQ2-9): Low Risk    PHQ-2 Score: 0  Financial Resource Strain: Low Risk    Difficulty of Paying Living Expenses: Not hard at all  Food Insecurity: No Food Insecurity   Worried About Charity fundraiser in the Last Year: Never true   Ran Out of Food in the Last Year: Never true  Housing: Low Risk    Last Housing Risk Score: 0  Physical Activity: Sufficiently Active   Days of Exercise per Week: 6 days   Minutes of Exercise per Session: 60 min  Social Connections: Not on file  Stress: No Stress Concern Present   Feeling of Stress : Not at all  Tobacco Use: Low Risk    Smoking Tobacco Use: Never   Smokeless Tobacco Use: Never   Passive Exposure: Not on file  Transportation Needs: No Transportation Needs   Lack of Transportation (Medical): No   Lack of Transportation (Non-Medical): No    CCM Care Plan  Allergies  Allergen Reactions   Other     Bee sting    Sucralfate Other (See Comments)    Medications Reviewed Today     Reviewed by Lynne Logan, RN (Registered Nurse) on 07/07/21 at 1114  Med List Status: <None>   Medication Order Taking? Sig Documenting Provider Last Dose Status Informant  amLODipine (NORVASC) 10 MG tablet [id number] No Take 10 mg by  mouth daily. [provider] Taking Active   aspirin 81 MG tablet [id number] No Take 1 tablet (81 mg total) by mouth daily. Saverio Danker, PA-C Taking Active Self  atorvastatin (LIPITOR) 10 MG tablet [id number] No TAKE 1 TABLET BY MOUTH MONDAY - FRIDAY AT BEDTIME Glendale Chard, MD Taking Active   brimonidine [facility]) 0.2 % ophthalmic solution [id number] No 1 drop 2 (two) times daily. [provider] Taking Active   docusate sodium (COLACE) 100 MG capsule [id number] No Take 100 mg by mouth 2 (two) times daily. [provider] Taking Active   dorzolamide-timolol (COSOPT) 22.3-6.8 MG/ML ophthalmic solution [id number] No Place 1 drop into both eyes 2 (  two) times daily. [provider] Taking Active Self  fluticasone (FLONASE) 50 MCG/ACT nasal spray [id number] No INHALE 2 SPRAYS INTO EACH NOSTRIL EVERY NIGHT Rozetta Nunnery, MD Taking Active   guaiFENesin (MUCINEX) 600 MG 12 hr tablet [id number] No Take by mouth 2 (two) times daily. [provider] Taking Active   latanoprost (XALATAN) 0.005 % ophthalmic solution [id number] No Place 1 drop into both eyes at bedtime. [provider] Taking Active Self  pantoprazole (PROTONIX) 40 MG tablet [id number] No TAKE 1 TABLET BY MOUTH EVERY DAY Glendale Chard, MD Taking Active   sildenafil (REVATIO) 20 MG tablet [id number] No Take 20 mg by mouth 3 (three) times daily. [provider] Taking Active            Med Note Amedeo Plenty, PAMELA   Wed Nov 27, 2020 11:39 AM)    tamsulosin (FLOMAX) 0.4 MG CAPS capsule [id number] No Take 0.4 mg by mouth. [provider] Taking Active   valsartan-hydrochlorothiazide (DIOVAN-HCT) 320-25 MG tablet [id number] No Take 1 tablet by mouth daily. [provider] Taking Active             Patient Active Problem List   Diagnosis Date Noted   Chest pain 08/28/2020   Constipation 08/28/2020   Cough 08/28/2020   Dysphagia 08/28/2020   Hemorrhage of rectum and  anus 08/28/2020   Atherosclerosis of aorta ([place]) 07/09/2020   Hypertensive nephropathy 05/02/2020   Gastric stromal tumor ([place]) 11/01/2019   Malignant neoplasm of prostate ([place]) 07/11/2019   Glaucoma 02/28/2018   Syncope and collapse    Syncope 11/08/2016   Stage 3 chronic kidney disease ([place]) 11/08/2016   Essential hypertension 11/08/2016   Hyperglycemia 11/08/2016   Pulmonary contusion 02/04/2012   Thigh hematoma 02/04/2012   H/O malignant gastrointestinal stromal tumor ([facility]) 07/20/2011    Immunization History  Administered Date(s) Administered   Fluad Quad(high Dose 65+) 03/15/2019, 07/09/2020   PFIZER(Purple Top)SARS-COV-2 Vaccination 07/01/2019, 07/24/2019, 05/24/2020, 09/22/2020   Tdap 03/15/2019    Conditions to be addressed/monitored:  Hypertension and Atherosclerosis of Aorta   Care Plan : [place]  Updates made by Mayford Knife, RPH since 07/15/2021 12:00 AM     Problem: HTN, Atherosclerosis of Aorta   Priority: High     Long-Range Goal: Disease Management   Start Date: 08/01/2020  Recent Progress: On track  Priority: High  Note:   Current Barriers:  Unable to independently monitor therapeutic efficacy  Pharmacist Clinical Goal(s):  Patient will achieve adherence to monitoring guidelines and medication adherence to achieve therapeutic efficacy through collaboration with PharmD and provider.   Interventions: 1:1 collaboration with Glendale Chard, MD regarding development and update of comprehensive plan of care as evidenced by provider attestation and co-signature Inter-disciplinary care team collaboration (see longitudinal plan of care) Comprehensive medication review performed; medication list updated in electronic medical record  Hypertension (BP goal <140/90) -Controlled -Current treatment: Amlodipine 10 mg tablet once per day Appropriate, Effective, Safe, Accessible Valsartan - hydrochlorothiazide 320-25 mg tablet Appropriate,  Effective, Safe, Accessible -Current home readings: 120/73, 117/74,  -Current dietary habits: he does not use any salt on his food  -Current exercise habits: patient reports that he is still exercising on a routine basis and we will discuss further during next office visit.  -Denies hypotensive/hypertensive symptoms -Educated on Daily salt intake goal < 2300 mg; Importance of home blood pressure monitoring; Proper BP monitoring technique; -Congratulate patient on checking his BP at home -Counseled to monitor BP at  home at least twice per day, document, and provide log at future appointments -Recommended to continue current medication  Atherosclerosis of Aorta: (LDL goal < 70) -Not ideally controlled -Current treatment: Atorvastatin 10 mg tablet once per day  -Current dietary patterns: he is only eating fried food some times  -Current exercise habits: willl discuss during next office visit  -Educated on Cholesterol goals;  Benefits of statin for ASCVD risk reduction; -Recommended to continue current medication -Recommend patient get lipid panel completed during next office visit       Medication Assistance: None required.  Patient affirms current coverage meets needs.  Compliance/Adherence/Medication fill history: Care Gaps: COVID-19 Vaccine Shingrix Vaccine  Pneumonia Vaccine   Star-Rating Drugs: Atorvastatin 10 mg tablet  Valsartan - hydrochlorothiazide 320-25 mg tablet  Patient's preferred pharmacy [facility]:  [id number] [place], [place] - [address] [id number]: [phone]: [phone] Uses pill box? Yes Pt endorses 90% compliance  We discussed: Benefits of medication synchronization, packaging and delivery as well as enhanced pharmacist oversight with [employer]. Patient decided to: Continue current medication management strategy  Care Plan and Follow Up Patient Decision:   Patient agrees to Care Plan and Follow-up.  Plan: The patient has been provided with contact information for the care management team and has been advised to call with any health related questions or concerns.   VOrlando Penner CPP, PharmD Clinical Pharmacist Practitioner [facility] [phone]

## 2021-07-07 ENCOUNTER — Telehealth: Payer: Medicare Other

## 2021-07-07 ENCOUNTER — Ambulatory Visit: Payer: Self-pay

## 2021-07-07 DIAGNOSIS — I700 Atherosclerosis of aorta: Secondary | ICD-10-CM

## 2021-07-07 DIAGNOSIS — N1831 Chronic kidney disease, stage 3a: Secondary | ICD-10-CM

## 2021-07-07 DIAGNOSIS — I129 Hypertensive chronic kidney disease with stage 1 through stage 4 chronic kidney disease, or unspecified chronic kidney disease: Secondary | ICD-10-CM

## 2021-07-07 DIAGNOSIS — D472 Monoclonal gammopathy: Secondary | ICD-10-CM

## 2021-07-08 NOTE — Patient Instructions (Signed)
Visit Information  Thank you for taking time to visit with me today. Please don't hesitate to contact me if I can be of assistance to you before our next scheduled telephone appointment.  Following are the goals we discussed today:  (Copy and paste patient goals from clinical care plan here)  Our next appointment is by telephone on 09/08/21 at 10 AM   Please call the care guide team at [phone] if you need to cancel or reschedule your appointment.   If you are experiencing a Mental Health or [place] or need someone to talk to, please call [phone]-TALK (toll free, 24 hour hotline)   Patient verbalizes understanding of instructions and care plan provided today and agrees to view in [place]. Active MyChart status confirmed with patient.    Barb Merino, RN, BSN, CCM Care Management Coordinator [place] [facility]  Direct Phone: [phone]

## 2021-07-08 NOTE — Chronic Care Management (AMB) (Signed)
Chronic Care Management   CCM RN Visit Note  07/07/2021 Name: Lawrence Bailey MRN: [id number] DOB: January 10, 1941  Subjective: Lawrence Bailey is a 81 y.o. year old male who is a primary care patient of Glendale Chard, MD. The care management team was consulted for assistance with disease management and care coordination needs.    Engaged with patient by telephone for follow up visit in response to provider referral for case management and/or care coordination services.   Consent to Services:  The patient was given information about Chronic Care Management services, agreed to services, and gave verbal consent prior to initiation of services.  Please see initial visit note for detailed documentation.   Patient agreed to services and verbal consent obtained.   Assessment: Review of patient past medical history, allergies, medications, health status, including review of consultants reports, laboratory and other test data, was performed as part of comprehensive evaluation and provision of chronic care management services.   SDOH (Social Determinants of Health) assessments and interventions performed:    CCM Care Plan  Allergies  Allergen Reactions   Other     Bee sting    Sucralfate Other (See Comments)    Outpatient Encounter Medications as of 07/07/2021  Medication Sig   amLODipine (NORVASC) 10 MG tablet Take 10 mg by mouth daily.   aspirin 81 MG tablet Take 1 tablet (81 mg total) by mouth daily.   atorvastatin (LIPITOR) 10 MG tablet TAKE 1 TABLET BY MOUTH MONDAY - FRIDAY AT BEDTIME   brimonidine (ALPHAGAN) 0.2 % ophthalmic solution 1 drop 2 (two) times daily.   docusate sodium (COLACE) 100 MG capsule Take 100 mg by mouth 2 (two) times daily.   dorzolamide-timolol (COSOPT) 22.3-6.8 MG/ML ophthalmic solution Place 1 drop into both eyes 2 (two) times daily.   fluticasone (FLONASE) 50 MCG/ACT nasal spray INHALE 2 SPRAYS INTO EACH NOSTRIL EVERY NIGHT   guaiFENesin (MUCINEX) 600 MG 12 hr  tablet Take by mouth 2 (two) times daily.   latanoprost (XALATAN) 0.005 % ophthalmic solution Place 1 drop into both eyes at bedtime.   pantoprazole (PROTONIX) 40 MG tablet TAKE 1 TABLET BY MOUTH EVERY DAY   sildenafil (REVATIO) 20 MG tablet Take 20 mg by mouth 3 (three) times daily.   tamsulosin (FLOMAX) 0.4 MG CAPS capsule Take 0.4 mg by mouth.   valsartan-hydrochlorothiazide (DIOVAN-HCT) 320-25 MG tablet Take 1 tablet by mouth daily.   No facility-administered encounter medications on file as of 07/07/2021.    Patient Active Problem List   Diagnosis Date Noted   Chest pain 08/28/2020   Constipation 08/28/2020   Cough 08/28/2020   Dysphagia 08/28/2020   Hemorrhage of rectum and anus 08/28/2020   Atherosclerosis of aorta ([place]) 07/09/2020   Hypertensive nephropathy 05/02/2020   Gastric stromal tumor ([place]) 11/01/2019   Malignant neoplasm of prostate ([place]) 07/11/2019   Glaucoma 02/28/2018   Syncope and collapse    Syncope 11/08/2016   Stage 3 chronic kidney disease ([place]) 11/08/2016   Essential hypertension 11/08/2016   Hyperglycemia 11/08/2016   Pulmonary contusion 02/04/2012   Thigh hematoma 02/04/2012   H/O malignant gastrointestinal stromal tumor (GIST) 07/20/2011    Conditions to be addressed/monitored: Hypertensive nephropathy, Atherosclerosis of aorta,  Stage 3a chronic kidney disease, Monoclonal gammopathy of unknown significance  Care Plan : RN Care Manager Plan of Care  Updates made by Lynne Logan, RN since 07/07/2021 12:00 AM     Problem: No plan of care established for management of chronic disease  states (Hypertensive nephropathy, Atherosclerosis of aorta,  Stage 3a chronic kidney disease, Monoclonal gammopathy of unknown significance)   Priority: High     Long-Range Goal: Development of plan of care for chronic disease management for (Hypertensive nephropathy, Atherosclerosis of aorta,  Stage 3a chronic kidney disease, Monoclonal gammopathy of unknown  significance)   Start Date: 05/05/2021  Expected End Date: 05/05/2022  Recent Progress: On track  Priority: High  Note:   Current Barriers:  Knowledge Deficits related to plan of care for management of Hypertensive nephropathy, Atherosclerosis of aorta,  Stage 3a chronic kidney disease, Monoclonal gammopathy of unknown significance  Chronic Disease Management support and education needs related to Hypertensive nephropathy, Atherosclerosis of aorta,  Stage 3a chronic kidney disease, Monoclonal gammopathy of unknown significance   RNCM Clinical Goal(s):  Patient will verbalize basic understanding of  Hypertensive nephropathy, Atherosclerosis of aorta,  Stage 3a chronic kidney disease, Monoclonal gammopathy of unknown significance disease process and self health management plan as evidenced by patient will report having no disease exacerbations related to his chronic disease states listed above take all medications exactly as prescribed and will call provider for medication related questions as evidenced by patient will report having no missed doses of his prescribed medications  demonstrate Ongoing health management independence as evidenced by patient will report 100% adherence to his prescribed treatment plan  continue to work with RN Care Manager to address care management and care coordination needs related to  Hypertensive nephropathy, Atherosclerosis of aorta,  Stage 3a chronic kidney disease, Monoclonal gammopathy of unknown significance as evidenced by adherence to CM Team Scheduled appointments demonstrate ongoing self health care management ability   as evidenced by    through collaboration with RN Care manager, provider, and care team.   Interventions: 1:1 collaboration with primary care provider regarding development and update of comprehensive plan of care as evidenced by provider attestation and co-signature Inter-disciplinary care team collaboration (see longitudinal plan of  care) Evaluation of current treatment plan related to  self management and patient's adherence to plan as established by provider   Chronic Kidney Disease Interventions:  (Status:  Condition stable.  Not addressed this visit.) Long Term Goal Evaluation of current treatment plan related to chronic kidney disease self management and patient's adherence to plan as established by provider      Reviewed prescribed diet increase water to 3-4 bottles per day (around 64 oz) Provided education on kidney disease progression    Discussed plans with patient for ongoing care management follow up and provided patient with direct contact information for care management team Last practice recorded BP readings:  BP Readings from Last 3 Encounters:  02/05/21 138/70  01/30/21 (!) 156/91  11/27/20 (!) 142/88  Most recent eGFR/CrCl: No results found for: EGFR  No components found for: CRCL  Monoclonal gammopathy of unknown significance (MGUS):  (Status:  Condition stable.  Not addressed this visit.)  Long Term Goal Evaluation of current treatment plan related to  Monoclonal gammopathy of unknown significance (MGUS) , self-management and patient's adherence to plan as established by provider. Evaluation of current treatment plan related to MGUS (Monoclonal gammopathy of unknown significance), self-management and patient's adherence to plan as established by provider. Collaboration with Glendale Chard, MD regarding development and update of comprehensive plan of care as evidenced by provider attestation       and co-signature Inter-disciplinary care team collaboration (see longitudinal plan of care) Review of patient status, including review of consultants reports, relevant laboratory and other test  and medications completed. °Determined patient completed follow up visit with Dr. Sherrill on 07/30/20 with the following Assessment/Plan noted:  °Assessment/Plan: °GI stromal tumor of the gastric fundus November  2008 status post partial gastrectomy 05/16/2007. Pathology showed a 6 cm GI stromal tumor. No vascular or lymphatic invasion. All surgical margins negative. Lymph nodes not sampled. Tumor confined to the submucosa. Mitotic activity 25-50 mitoses per high-power field. He completed 1 year of Gleevec 400 mg daily. Restaging CT abdomen/pelvis 04/10/2014 with no evidence of recurrent/metastatic disease.  CT abdomen/pelvis 05/12/2018 without evidence of local recurrence or metastatic disease. °History of right lower extremity DVT June 2018. °Evaluation in the emergency department 03/08/2018 for right leg redness, swelling and tenderness.  Venous Doppler negative for DVT.  Incidental finding of a possible 3.3 cm right inguinal lymph node.  CT abdomen/pelvis 05/12/2018 showed stable small inguinal lymph nodes bilaterally. °Chronic renal failure °Monoclonal gammopathy of unknown significance °01/09/2019 SPEP with M spike 1.5  °07/05/2019 SPEP with M spike 1.9 °Hypertension °Prostate cancer-on surveillance, followed by urology °Disposition: °Mr. Davisson appears stable.  He is in clinical remission from the gastrointestinal stromal tumor.  There is no clinical or laboratory evidence for progression to multiple myeloma.  We will follow up on the myeloma panel from today.  He will return for an office and lab visit in 6 months. °Determined patient verbalizes understanding of his prescribed treatment plan and recommendations for ongoing follow up °Discussed plans with patient for ongoing care management follow up and provided patient with direct contact information for care management team ° °Hypertension Interventions:  (Status:  Goal on track:  Yes.) Long Term Goal °Last practice recorded BP readings:  °BP Readings from Last 3 Encounters:  °02/05/21 138/70  °01/30/21 (!) 156/91  °11/27/20 (!) 142/88  °Most recent eGFR/CrCl: No results found for: EGFR  No components found for: CRCL °Evaluation of current treatment plan related to  hypertension self management and patient's adherence to plan as established by provider °Reviewed medications with patient and discussed importance of compliance °Advised patient, providing education and rationale, to monitor blood pressure daily and record, calling PCP for findings outside established parameters °Provided verbal and written instructions to patient on how to accurately monitor BP at home °Educated patient providing rationale the importance of adhering to low Sodium diet  °Educated patient on exercise recommendations, discussed goal for 150 minutes weekly as tolerated  °Mailed printed educational materials related How to Accurately Monitor BP at Home; Why Should I Limit Sodium  °Discussed plans with patient for ongoing care management follow up and provided patient with direct contact information for care management team ° ° °Fatigue Interventions:  (Status:  New goal.)  Long Term Goal °Evaluation of current treatment plan related to  Fatigue , self-management and patient's adherence to plan as established by provider °Discussed patient's complaints of persistent fatigue °Review of patient status, including review of consultant's reports, relevant laboratory and other test results, and medications completed °Educated patient on the importance of keeping PCP informed of persistent fatigue °Reviewed and discussed next scheduled follow up with PCP set for 11/20/21 @2 PM, determined patient does not feel the need to follow up before this time °Assessed for sleep deprivation, determined patient falls asleep around 1:30 AM and awakens around 7:30 AM, patient states this has been a life long sleep schedule due to working different shifts during his working years  °Educated patient on the importance of getting adequate amount of sleep, focusing on getting sound sleep to help feel   feel rested upon awakening Encouraged patient on the importance to stay well hydrated, exercise 30 minutes each day during most  energetic time of day and 30 minutes (as tolerated) prior to bedtime, avoid naps during the day and consider diffusing and or bathing in [place] prior to bedtime Mailed printed educational materials related to Chair Exercises Discussed plans with patient for ongoing care management follow up and provided patient with direct contact information for care management team  Patient Goals/Self-Care Activities: Take all medications as prescribed Attend all scheduled provider appointments Call pharmacy for medication refills 3-7 days in advance of running out of medications Perform all self care activities independently  Call provider office for new concerns or questions  check blood pressure weekly keep a blood pressure log call doctor for signs and symptoms of high blood pressure take medications for blood pressure exactly as prescribed begin an exercise program Try diffusing Lavender essential oil and or showering/bathing with lavender body wash prior to bedtime Try to drink 6-8 glasses of water each day   Follow Up Plan:  Telephone follow up appointment with care management team member scheduled for:  09/08/21        Plan:Telephone follow up appointment with care management team member scheduled for:  09/08/21  Barb Merino, RN, BSN, CCM Care Management Coordinator [facility]  Direct Phone: [phone]

## 2021-07-15 NOTE — Patient Instructions (Signed)
Visit Information It was great speaking with you today!  Please let me know if you have any questions about our visit.   Goals Addressed             This Visit's Progress    Manage My Medicine       Timeframe:  Long-Range Goal Priority:  High Start Date:     08/01/2020                       Expected End Date:                       Follow Up Date August 22nd, 2023   In progress:   - call for medicine refill 2 or 3 days before it runs out - call if I am sick and can't take my medicine - learn to read medicine labels - use an alarm clock or phone to remind me to take my medicine    Why is this important?   These steps will help you keep on track with your medicines.        Patient Care Plan: CCM Pharmacy Care Plan     Problem Identified: HTN, Atherosclerosis of Aorta   Priority: High     Long-Range Goal: Disease Management   Start Date: 08/01/2020  Recent Progress: On track  Priority: High  Note:   Current Barriers:  Unable to independently monitor therapeutic efficacy  Pharmacist Clinical Goal(s):  Patient will achieve adherence to monitoring guidelines and medication adherence to achieve therapeutic efficacy through collaboration with PharmD and provider.   Interventions: 1:1 collaboration with Glendale Chard, MD regarding development and update of comprehensive plan of care as evidenced by provider attestation and co-signature Inter-disciplinary care team collaboration (see longitudinal plan of care) Comprehensive medication review performed; medication list updated in electronic medical record  Hypertension (BP goal <140/90) -Controlled -Current treatment: Amlodipine 10 mg tablet once per day Appropriate, Effective, Safe, Accessible Valsartan - hydrochlorothiazide 320-25 mg tablet Appropriate, Effective, Safe, Accessible -Current home readings: 120/73, 117/74,  -Current dietary habits: he does not use any salt on his food  -Current exercise habits: patient  reports that he is still exercising on a routine basis and we will discuss further during next office visit.  -Denies hypotensive/hypertensive symptoms -Educated on Daily salt intake goal < 2300 mg; Importance of home blood pressure monitoring; Proper BP monitoring technique; -Congratulate patient on checking his BP at home -Counseled to monitor BP at home at least twice per day, document, and provide log at future appointments -Recommended to continue current medication  Atherosclerosis of Aorta: (LDL goal < 70) -Not ideally controlled -Current treatment: Atorvastatin 10 mg tablet once per day  -Current dietary patterns: he is only eating fried food some times  -Current exercise habits: willl discuss during next office visit  -Educated on Cholesterol goals;  Benefits of statin for ASCVD risk reduction; -Recommended to continue current medication -Recommend patient get lipid panel completed during next office visit        Patient agreed to services and verbal consent obtained.   The patient verbalized understanding of instructions, educational materials, and care plan provided today and agreed to receive a mailed copy of patient instructions, educational materials, and care plan.   Orlando Penner, PharmD Clinical Pharmacist [facility] [phone]

## 2021-07-18 ENCOUNTER — Other Ambulatory Visit: Payer: Self-pay | Admitting: Internal Medicine

## 2021-07-22 DIAGNOSIS — I10 Essential (primary) hypertension: Secondary | ICD-10-CM

## 2021-07-22 DIAGNOSIS — N1831 Chronic kidney disease, stage 3a: Secondary | ICD-10-CM

## 2021-07-22 DIAGNOSIS — I129 Hypertensive chronic kidney disease with stage 1 through stage 4 chronic kidney disease, or unspecified chronic kidney disease: Secondary | ICD-10-CM

## 2021-07-24 ENCOUNTER — Other Ambulatory Visit: Payer: Self-pay

## 2021-07-24 ENCOUNTER — Ambulatory Visit (INDEPENDENT_AMBULATORY_CARE_PROVIDER_SITE_OTHER): Payer: No Typology Code available for payment source | Admitting: Internal Medicine

## 2021-07-24 ENCOUNTER — Encounter: Payer: Self-pay | Admitting: Internal Medicine

## 2021-07-24 VITALS — BP 122/70 | HR 69 | Temp 97.9°F | Ht 68.0 in | Wt 182.2 lb

## 2021-07-24 DIAGNOSIS — Z8509 Personal history of malignant neoplasm of other digestive organs: Secondary | ICD-10-CM | POA: Diagnosis not present

## 2021-07-24 DIAGNOSIS — D472 Monoclonal gammopathy: Secondary | ICD-10-CM | POA: Diagnosis not present

## 2021-07-24 DIAGNOSIS — W108XXA Fall (on) (from) other stairs and steps, initial encounter: Secondary | ICD-10-CM | POA: Diagnosis not present

## 2021-07-24 DIAGNOSIS — R296 Repeated falls: Secondary | ICD-10-CM

## 2021-07-24 DIAGNOSIS — Z23 Encounter for immunization: Secondary | ICD-10-CM | POA: Diagnosis not present

## 2021-07-24 DIAGNOSIS — R202 Paresthesia of skin: Secondary | ICD-10-CM

## 2021-07-24 DIAGNOSIS — C61 Malignant neoplasm of prostate: Secondary | ICD-10-CM

## 2021-07-24 DIAGNOSIS — I700 Atherosclerosis of aorta: Secondary | ICD-10-CM

## 2021-07-24 DIAGNOSIS — Z2821 Immunization not carried out because of patient refusal: Secondary | ICD-10-CM

## 2021-07-24 DIAGNOSIS — H6123 Impacted cerumen, bilateral: Secondary | ICD-10-CM

## 2021-07-24 DIAGNOSIS — E559 Vitamin D deficiency, unspecified: Secondary | ICD-10-CM

## 2021-07-24 NOTE — Progress Notes (Signed)
?[name],acting as a Education administrator for [name], MD.,have documented all relevant documentation on the behalf of [name], MD,as directed by  [name], MD while in the presence of [name], MD.  ?This visit occurred during the SARS-CoV-2 public health emergency.  Safety protocols were in place, including screening questions prior to the visit, additional usage of staff PPE, and extensive cleaning of exam room while observing appropriate contact time as indicated for disinfecting solutions. ? ?Subjective:  ?  ? Patient ID: Lawrence Bailey , male    DOB: September 06, 1940 , 81 y.o.   MRN: [id number] ? ? ?Chief Complaint  ?Patient presents with  ? Ear Fullness  ? ? ?HPI ? ?Patient would like his ears cleaned out. He states his hearing is muffled and they "feel full". Denies fever/chills. Has no other concerns.  ? ?Ear Fullness  ?There is pain in both ears. This is a new problem. The problem has been gradually worsening. There has been no fever. Associated symptoms include hearing loss.   ? ?Past Medical History:  ?Diagnosis Date  ? BPH (benign prostatic hyperplasia)   ? Coronary artery disease   ? GIST (gastrointestinal stromal tumor), malignant ([place]) dx'd 04/2007  ? gleevac comp 04/2008  ? Glaucoma   ? Hypertension   ? Prostate cancer ([place])   ? Renal insufficiency   ?  ? ?Family History  ?Problem Relation Age of Onset  ? Hypertension Mother   ? Cancer Mother   ? Breast cancer Neg Hx   ? Prostate cancer Neg Hx   ? Colon cancer Neg Hx   ? Pancreatic cancer Neg Hx   ? ? ? ?Current Outpatient Medications:  ?  amLODipine (NORVASC) 10 MG tablet, Take 10 mg by mouth daily., Disp: , Rfl: 3 ?  aspirin 81 MG tablet, Take 1 tablet (81 mg total) by mouth daily., Disp: 30 tablet, Rfl:  ?  atorvastatin (LIPITOR) 10 MG tablet, TAKE 1 TABLET BY MOUTH MONDAY - FRIDAY AT BEDTIME, Disp: 90 tablet, Rfl: 1 ?  brimonidine (ALPHAGAN) 0.2 % ophthalmic solution, 1 drop 2 (two) times daily., Disp: , Rfl:  ?  docusate  sodium (COLACE) 100 MG capsule, Take 100 mg by mouth 2 (two) times daily., Disp: , Rfl:  ?  dorzolamide-timolol (COSOPT) 22.3-6.8 MG/ML ophthalmic solution, Place 1 drop into both eyes 2 (two) times daily., Disp: , Rfl: 0 ?  fluticasone (FLONASE) 50 MCG/ACT nasal spray, INHALE 2 SPRAYS INTO EACH NOSTRIL EVERY NIGHT, Disp: 16 mL, Rfl: 5 ?  latanoprost (XALATAN) 0.005 % ophthalmic solution, Place 1 drop into both eyes at bedtime., Disp: , Rfl:  ?  pantoprazole (PROTONIX) 40 MG tablet, TAKE 1 TABLET BY MOUTH EVERY DAY, Disp: 90 tablet, Rfl: 1 ?  sildenafil (REVATIO) 20 MG tablet, Take 20 mg by mouth 3 (three) times daily., Disp: , Rfl:  ?  tamsulosin (FLOMAX) 0.4 MG CAPS capsule, Take 0.4 mg by mouth., Disp: , Rfl:  ?  valsartan-hydrochlorothiazide (DIOVAN-HCT) 320-25 MG tablet, Take 1 tablet by mouth daily., Disp: , Rfl:   ? ?Allergies  ?Allergen Reactions  ? Other   ?  Bee sting ?  ? Sucralfate Other (See Comments)  ?  ? ?Review of Systems  ?Constitutional: Negative.   ?HENT:  Positive for hearing loss.   ?     B/l ear fullness, no ear pain. Has noticed decreased hearing  ?Respiratory: Negative.    ?Cardiovascular: Negative.   ?Gastrointestinal: Negative.   ?  Musculoskeletal:   ?     He reports he had two falls since his last visit. First fall occurred in Nov, right before Thanksgiving, when walking up the steps. He admits he did not cut the light on, so he could not see what he was doing. He fell when he got to the top of the steps, he fell to his right side and hit a side table. He states he did not injure himself.  ? ?Second fall was on New years, he was cooking a roast and fell when he walked towards the stove. He tripped on the mat and fell to the ground. He states he did have some soreness after the fall, but that resolved. He did not seek medical attention for either incident.   ?Neurological:  Positive for numbness.  ?Psychiatric/Behavioral: Negative.     ? ?Today's Vitals  ? 07/24/21 0911  ?BP: 122/70   ?Pulse: 69  ?Temp: 97.9 ?F (36.6 ?C)  ?Weight: 182 lb 3.2 oz (82.6 kg)  ?Height: 5\' 8"  (1.727 m)  ?PainSc: 0-No pain  ? ?Body mass index is 27.7 kg/m?.  ? ?Objective:  ?Physical Exam ?Vitals and nursing note reviewed.  ?Constitutional:   ?   Appearance: Normal appearance.  ?HENT:  ?   Head: Normocephalic and atraumatic.  ?   Right Ear: Ear canal and external ear normal. There is impacted cerumen.  ?   Left Ear: Ear canal and external ear normal. There is impacted cerumen.  ?   Nose:  ?   Comments: Masked ? ?   Mouth/Throat:  ?   Comments: Masked  ?Eyes:  ?   Extraocular Movements: Extraocular movements intact.  ?Cardiovascular:  ?   Rate and Rhythm: Normal rate and regular rhythm.  ?   Heart sounds: Normal heart sounds.  ?Pulmonary:  ?   Effort: Pulmonary effort is normal.  ?   Breath sounds: Normal breath sounds.  ?Skin: ?   General: Skin is warm.  ?Neurological:  ?   General: No focal deficit present.  ?   Mental Status: He is alert.  ?Psychiatric:     ?   Mood and Affect: Mood normal.  ?  ? ?   ?Assessment And Plan:  ?   ?1. Bilateral impacted cerumen ? ?AFTER OBTAINING VERBAL CONSENT, BOTH EARS WERE FLUSHED BY IRRIGATION. SHE TOLERATED PROCEDURE WELL WITHOUT ANY COMPLICATIONS. NO TM ABNORMALITIES WERE NOTED. ? ?- Ear Lavage ? ?2. Atherosclerosis of aorta ([place]) ?Comments: Chronic, encouraged to live heart healthy lifestyle. He is currently on ASA 81 and statin therapy.  ? ?3. Paresthesia of both feet ?Comments: I will check thyroid function and vitamin B12 levels. This may be contributing to his falls. He does not wish to take meds at this time. Will consider gabapenti ?- TSH ?- Vitamin B12 ? ?4. Fall (on) (from) other stairs and steps, initial encounter ?Comments: Occcurred both in Nov and most recently 05/25/21. I will refer him to home health PT. He is homebound b/c he does not drive, his wife takes him to all appts. ?- Ambulatory referral to Home Health ? ?5. Recurrent falls ?Comments: I will refer him to PT  for gait/balance training.  ?- Ambulatory referral to Home Health ? ?6. MGUS (monoclonal gammopathy of unknown significance) ?Comments: Chronic, Hem/Onc input appreciated. Per Onc, no evidence of progression to MM. He is followed yearly w/ SPEP and IgG levels. ? ?7. Malignant neoplasm of prostate ([place]) ?Comments: Currently under surveillance by Urology, Dr. Lovena Neighbours. ? ?  8. Immunization due ?- Pneumococcal conjugate vaccine 20-valent (Prevnar 20) ? ?9. History of gastrointestinal stromal tumor (GIST) ?Comments: S/p partial gastrectomy 05/16/2007.He completed 1 year of Gleevec 400 mg daily. ?  ?Patient was given opportunity to ask questions. Patient verbalized understanding of the plan and was able to repeat key elements of the plan. All questions were answered to their satisfaction.  ? ?I, [name], MD, have reviewed all documentation for this visit. The documentation on 07/27/21 for the exam, diagnosis, procedures, and orders are all accurate and complete.  ? ?IF YOU HAVE BEEN REFERRED TO A SPECIALIST, IT MAY TAKE 1-2 WEEKS TO SCHEDULE/PROCESS THE REFERRAL. IF YOU HAVE NOT HEARD FROM US/SPECIALIST IN TWO WEEKS, PLEASE GIVE [place] A CALL AT [phone] X [phone].  ? ?THE PATIENT IS ENCOURAGED TO PRACTICE SOCIAL DISTANCING DUE TO THE COVID-19 PANDEMIC.   ?

## 2021-07-24 NOTE — Patient Instructions (Signed)
Earwax Buildup, Adult ?The ears produce a substance called earwax that helps keep bacteria out of the ear and protects the skin in the ear canal. Occasionally, earwax can build up in the ear and cause discomfort or hearing loss. ?What are the causes? ?This condition is caused by a buildup of earwax. Ear canals are self-cleaning. Ear wax is made in the outer part of the ear canal and generally falls out in small amounts over time. ?When the self-cleaning mechanism is not working, earwax builds up and can cause decreased hearing and discomfort. Attempting to clean ears with cotton swabs can push the earwax deep into the ear canal and cause decreased hearing and pain. ?What increases the risk? ?This condition is more likely to develop in people who: ?Clean their ears often with cotton swabs. ?Pick at their ears. ?Use earplugs or in-ear headphones often, or wear hearing aids. ?The following factors may also make you more likely to develop this condition: ?Being male. ?Being of older age. ?Naturally producing more earwax. ?Having narrow ear canals. ?Having earwax that is overly thick or sticky. ?Having excess hair in the ear canal. ?Having eczema. ?Being dehydrated. ?What are the signs or symptoms? ?Symptoms of this condition include: ?Reduced or muffled hearing. ?A feeling of fullness in the ear or feeling that the ear is plugged. ?Fluid coming from the ear. ?Ear pain or an itchy ear. ?Ringing in the ear. ?Coughing. ?Balance problems. ?An obvious piece of earwax that can be seen inside the ear canal. ?How is this diagnosed? ?This condition may be diagnosed based on: ?Your symptoms. ?Your medical history. ?An ear exam. During the exam, your health care provider will look into your ear with an instrument called an otoscope. ?You may have tests, including a hearing test. ?How is this treated? ?This condition may be treated by: ?Using ear drops to soften the earwax. ?Having the earwax removed by a health care provider. The  health care provider may: ?Flush the ear with water. ?Use an instrument that has a loop on the end (curette). ?Use a suction device. ?Having surgery to remove the wax buildup. This may be done in severe cases. ?Follow these instructions at home: ? ?Take over-the-counter and prescription medicines only as told by your health care provider. ?Do not put any objects, including cotton swabs, into your ear. You can clean the opening of your ear canal with a washcloth or facial tissue. ?Follow instructions from your health care provider about cleaning your ears. Do not overclean your ears. ?Drink enough fluid to keep your urine pale yellow. This will help to thin the earwax. ?Keep all follow-up visits as told. If earwax builds up in your ears often or if you use hearing aids, consider seeing your health care provider for routine, preventive ear cleanings. Ask your health care provider how often you should schedule your cleanings. ?If you have hearing aids, clean them according to instructions from the manufacturer and your health care provider. ?Contact a health care provider if: ?You have ear pain. ?You develop a fever. ?You have pus or other fluid coming from your ear. ?You have hearing loss. ?You have ringing in your ears that does not go away. ?You feel like the room is spinning (vertigo). ?Your symptoms do not improve with treatment. ?Get help right away if: ?You have bleeding from the affected ear. ?You have severe ear pain. ?Summary ?Earwax can build up in the ear and cause discomfort or hearing loss. ?The most common symptoms of this condition include   reduced or muffled hearing, a feeling of fullness in the ear, or feeling that the ear is plugged. ?This condition may be diagnosed based on your symptoms, your medical history, and an ear exam. ?This condition may be treated by using ear drops to soften the earwax or by having the earwax removed by a health care provider. ?Do not put any objects, including cotton  swabs, into your ear. You can clean the opening of your ear canal with a washcloth or facial tissue. ?This information is not intended to replace advice given to you by your health care provider. Make sure you discuss any questions you have with your health care provider. ?Document Revised: 08/29/2019 Document Reviewed: 08/29/2019 ?Elsevier Patient Education ? 2022 [employer] ? ?

## 2021-07-25 LAB — VITAMIN B12: Vitamin B-12: 1088 pg/mL (ref 232–1245)

## 2021-07-25 LAB — TSH: TSH: 1.64 u[IU]/mL (ref 0.450–4.500)

## 2021-07-26 ENCOUNTER — Other Ambulatory Visit: Payer: Self-pay | Admitting: Internal Medicine

## 2021-07-27 DIAGNOSIS — R202 Paresthesia of skin: Secondary | ICD-10-CM | POA: Insufficient documentation

## 2021-07-27 DIAGNOSIS — H6123 Impacted cerumen, bilateral: Secondary | ICD-10-CM | POA: Insufficient documentation

## 2021-07-27 DIAGNOSIS — W108XXA Fall (on) (from) other stairs and steps, initial encounter: Secondary | ICD-10-CM | POA: Insufficient documentation

## 2021-07-27 DIAGNOSIS — D472 Monoclonal gammopathy: Secondary | ICD-10-CM | POA: Insufficient documentation

## 2021-07-27 DIAGNOSIS — R296 Repeated falls: Secondary | ICD-10-CM | POA: Insufficient documentation

## 2021-08-04 ENCOUNTER — Other Ambulatory Visit: Payer: Self-pay | Admitting: Internal Medicine

## 2021-08-04 DIAGNOSIS — R296 Repeated falls: Secondary | ICD-10-CM

## 2021-08-04 DIAGNOSIS — R202 Paresthesia of skin: Secondary | ICD-10-CM

## 2021-09-08 ENCOUNTER — Telehealth: Payer: No Typology Code available for payment source

## 2021-09-11 DIAGNOSIS — C61 Malignant neoplasm of prostate: Secondary | ICD-10-CM | POA: Diagnosis not present

## 2021-09-11 DIAGNOSIS — M25511 Pain in right shoulder: Secondary | ICD-10-CM | POA: Diagnosis not present

## 2021-09-11 LAB — PSA: PSA: 8.58

## 2021-09-23 ENCOUNTER — Encounter: Payer: Self-pay | Admitting: Internal Medicine

## 2021-09-27 DIAGNOSIS — M170 Bilateral primary osteoarthritis of knee: Secondary | ICD-10-CM | POA: Diagnosis not present

## 2021-09-27 DIAGNOSIS — M1712 Unilateral primary osteoarthritis, left knee: Secondary | ICD-10-CM | POA: Diagnosis not present

## 2021-09-27 DIAGNOSIS — M1711 Unilateral primary osteoarthritis, right knee: Secondary | ICD-10-CM | POA: Diagnosis not present

## 2021-09-30 ENCOUNTER — Ambulatory Visit (HOSPITAL_COMMUNITY)
Admission: EM | Admit: 2021-09-30 | Discharge: 2021-09-30 | Disposition: A | Discharge status: Home / Self Care | Payer: No Typology Code available for payment source | Attending: Physician Assistant | Admitting: Physician Assistant

## 2021-09-30 ENCOUNTER — Encounter (HOSPITAL_COMMUNITY): Payer: Self-pay | Admitting: Emergency Medicine

## 2021-09-30 DIAGNOSIS — Z202 Contact with and (suspected) exposure to infections with a predominantly sexual mode of transmission: Secondary | ICD-10-CM | POA: Diagnosis not present

## 2021-09-30 MED ORDER — CEFTRIAXONE SODIUM 500 MG IJ SOLR
500.0000 mg | Freq: Once | INTRAMUSCULAR | Status: AC
Start: 2021-09-30 — End: 2021-09-30
  Administered 2021-09-30: 500 mg via INTRAMUSCULAR

## 2021-09-30 MED ORDER — CEFTRIAXONE SODIUM 500 MG IJ SOLR
INTRAMUSCULAR | Status: AC
  Filled 2021-09-30: qty 500

## 2021-09-30 NOTE — Discharge Instructions (Signed)
Will call with test results and treat if indicated ?Abstain from sexual activity until test results are back and any necessary treatment has been completed ?Follow up with PCP  ?

## 2021-09-30 NOTE — ED Triage Notes (Signed)
Pt reports that his wife was here this past weekend and tested positive for gonorrhea and he needing to be tested. Pt denies any s/s or "Issues in my 79 some years".   ?

## 2021-09-30 NOTE — ED Provider Notes (Signed)
?[facility] ? ? ? ?CSN: [id number] ?Arrival date & time: 09/30/21  1712 ? ? ?  ? ?History   ?Chief Complaint ?Chief Complaint  ?Patient presents with  ? Exposure to STD  ? ? ?HPI ?Lawrence Bailey is a 81 y.o. male.  ? ?Pt presents for STD testing.  Reports his wife recently tested positive for gonorrhea.  He reports he is asymptomatic at this time.   ? ? ?Past Medical History:  ?Diagnosis Date  ? BPH (benign prostatic hyperplasia)   ? Coronary artery disease   ? 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 gleevac comp 04/2008  ? Glaucoma   ? Hypertension   ? Prostate cancer ([place])   ? Renal insufficiency   ? ? ?Patient Active Problem List  ? Diagnosis Date Noted  ? Bilateral impacted cerumen 07/27/2021  ? Paresthesia of both feet 07/27/2021  ? Fall (on) (from) other stairs and steps, initial encounter 07/27/2021  ? Recurrent falls 07/27/2021  ? MGUS (monoclonal gammopathy of unknown significance) 07/27/2021  ? Chest pain 08/28/2020  ? Constipation 08/28/2020  ? Cough 08/28/2020  ? Dysphagia 08/28/2020  ? Hemorrhage of rectum and anus 08/28/2020  ? Atherosclerosis of aorta ([place]) 07/09/2020  ? Hypertensive nephropathy 05/02/2020  ? Malignant neoplasm of prostate ([place]) 07/11/2019  ? Glaucoma 02/28/2018  ? Syncope and collapse   ? Syncope 11/08/2016  ? Stage 3 chronic kidney disease ([place]) 11/08/2016  ? Essential hypertension 11/08/2016  ? Hyperglycemia 11/08/2016  ? Pulmonary contusion 02/04/2012  ? Thigh hematoma 02/04/2012  ? H/O malignant gastrointestinal stromal tumor (GIST) 07/20/2011  ? ? ?Past Surgical History:  ?Procedure Laterality Date  ? ABDOMINAL SURGERY    ? EYE SURGERY    ? HERNIA REPAIR    ? KNEE SURGERY    ? ? ? ? ? ?Home Medications   ? ?Prior to Admission medications   ?Medication Sig Start Date End Date Taking? Authorizing Provider  ?amLODipine (NORVASC) 10 MG tablet Take 10 mg by mouth daily. 04/13/17   [provider]  ?aspirin 81 MG tablet Take 1 tablet  (81 mg total) by mouth daily. 01/25/12   Saverio Danker, PA-C  ?atorvastatin (LIPITOR) 10 MG tablet TAKE 1 TABLET BY MOUTH MONDAY - FRIDAY AT BEDTIME 07/28/21   Glendale Chard, MD  ?brimonidine (ALPHAGAN) 0.2 % ophthalmic solution 1 drop 2 (two) times daily. 04/25/19   [provider]  ?docusate sodium (COLACE) 100 MG capsule Take 100 mg by mouth 2 (two) times daily.    [provider]  ?dorzolamide-timolol (COSOPT) 22.3-6.8 MG/ML ophthalmic solution Place 1 drop into both eyes 2 (two) times daily. 10/23/16   [provider]  ?fluticasone (FLONASE) 50 MCG/ACT nasal spray INHALE 2 SPRAYS INTO EACH NOSTRIL EVERY NIGHT 07/19/20   Rozetta Nunnery, MD  ?latanoprost (XALATAN) 0.005 % ophthalmic solution Place 1 drop into both eyes at bedtime.    [provider]  ?pantoprazole (PROTONIX) 40 MG tablet TAKE 1 TABLET BY MOUTH EVERY DAY 07/19/21   Glendale Chard, MD  ?sildenafil (REVATIO) 20 MG tablet Take 20 mg by mouth 3 (three) times daily.    [provider]  ?tamsulosin (FLOMAX) 0.4 MG CAPS capsule Take 0.4 mg by mouth.    [provider]  ?valsartan-hydrochlorothiazide (DIOVAN-HCT) 320-25 MG tablet Take 1 tablet by mouth daily.    [provider]  ? ? ?Family History ?Family History  ?Problem Relation Age of Onset  ? Hypertension Mother   ?  Cancer Mother   ? Breast cancer Neg Hx   ? Prostate cancer Neg Hx   ? Colon cancer Neg Hx   ? Pancreatic cancer Neg Hx   ? ? ?Social History ?Social History  ? ?Tobacco Use  ? Smoking status: Never  ? Smokeless tobacco: Never  ?Vaping Use  ? Vaping Use: Never used  ?Substance Use Topics  ? Alcohol use: Not Currently  ? Drug use: No  ? ? ? ?Allergies   ?Other and Sucralfate ? ? ?Review of Systems ?Review of Systems  ?Constitutional:  Negative for chills and fever.  ?HENT:  Negative for ear pain and sore throat.   ?Eyes:  Negative for pain and visual disturbance.  ?Respiratory:  Negative for cough and shortness of breath.    ?Cardiovascular:  Negative for chest pain and palpitations.  ?Gastrointestinal:  Negative for abdominal pain and vomiting.  ?Genitourinary:  Negative for dysuria and hematuria.  ?Musculoskeletal:  Negative for arthralgias and back pain.  ?Skin:  Negative for color change and rash.  ?Neurological:  Negative for seizures and syncope.  ?All other systems reviewed and are negative. ? ? ?Physical Exam ?Triage Vital Signs ?ED Triage Vitals  ?Enc Vitals Group  ?   BP 09/30/21 1742 (!) 164/93  ?   Pulse Rate 09/30/21 1742 64  ?   Resp 09/30/21 1742 18  ?   Temp 09/30/21 1742 98.8 ?F (37.1 ?C)  ?   Temp Source 09/30/21 1742 Oral  ?   SpO2 09/30/21 1742 97 %  ?   Weight --   ?   Height --   ?   Head Circumference --   ?   Peak Flow --   ?   Pain Score 09/30/21 1741 0  ?   Pain Loc --   ?   Pain Edu? --   ?   Excl. in [place]? --   ? ?No data found. ? ?Updated Vital Signs ?BP (!) 164/93 (BP Location: Right Arm)   Pulse 64   Temp 98.8 ?F (37.1 ?C) (Oral)   Resp 18   SpO2 97%  ? ?Visual Acuity ?Right Eye Distance:   ?Left Eye Distance:   ?Bilateral Distance:   ? ?Right Eye Near:   ?Left Eye Near:    ?Bilateral Near:    ? ?Physical Exam ?Vitals and nursing note reviewed.  ?Constitutional:   ?   General: He is not in acute distress. ?   Appearance: He is well-developed.  ?HENT:  ?   Head: Normocephalic and atraumatic.  ?Eyes:  ?   Conjunctiva/sclera: Conjunctivae normal.  ?Cardiovascular:  ?   Rate and Rhythm: Normal rate and regular rhythm.  ?   Heart sounds: No murmur heard. ?Pulmonary:  ?   Effort: Pulmonary effort is normal. No respiratory distress.  ?   Breath sounds: Normal breath sounds.  ?Abdominal:  ?   Palpations: Abdomen is soft.  ?   Tenderness: There is no abdominal tenderness.  ?Musculoskeletal:     ?   General: No swelling.  ?   Cervical back: Neck supple.  ?Skin: ?   General: Skin is warm and dry.  ?   Capillary Refill: Capillary refill takes less than 2 seconds.  ?Neurological:  ?   Mental Status: He is alert.   ?Psychiatric:     ?   Mood and Affect: Mood normal.  ? ? ? ?UC Treatments / Results  ?Labs ?(all labs ordered are listed, but only abnormal results  are displayed) ?Labs Reviewed  ?CYTOLOGY, (ORAL, ANAL, URETHRAL) ANCILLARY ONLY  ? ? ?EKG ? ? ?Radiology ?No results found. ? ?Procedures ?Procedures (including critical care time) ? ?Medications Ordered in UC ?Medications  ?cefTRIAXone (ROCEPHIN) injection 500 mg (500 mg Intramuscular Given 09/30/21 1811)  ? ? ?Initial Impression / Assessment and Plan / UC Course  ?I have reviewed the triage vital signs and the nursing notes. ? ?Pertinent labs & imaging results that were available during my care of the patient were reviewed by me and considered in my medical decision making (see chart for details). ? ?  ? ?Cytology self swab done in clinic today, labs pending.  Pt asymptomatic, positive exposure.  Ceftriaxone given in clinic today.  Return precautions disucssed.  ?Final Clinical Impressions(s) / UC Diagnoses  ? ?Final diagnoses:  ?STD exposure  ? ? ? ?Discharge Instructions   ? ?  ?Will call with test results and treat if indicated ?Abstain from sexual activity until test results are back and any necessary treatment has been completed ?Follow up with PCP  ? ? ?ED Prescriptions   ?None ?  ? ?PDMP not reviewed this encounter. ?  ?Ward, Lenise Arena, PA-C ?09/30/21 1857 ? ?

## 2021-10-01 LAB — CYTOLOGY, (ORAL, ANAL, URETHRAL) ANCILLARY ONLY
Chlamydia: NEGATIVE
Comment: NEGATIVE
Comment: NEGATIVE
Comment: NORMAL
Neisseria Gonorrhea: POSITIVE — AB
Trichomonas: NEGATIVE

## 2021-10-11 ENCOUNTER — Encounter: Payer: Self-pay | Admitting: Internal Medicine

## 2021-10-22 ENCOUNTER — Telehealth: Payer: Self-pay | Admitting: Interventional Cardiology

## 2021-10-22 NOTE — Telephone Encounter (Signed)
Left message for patient to call back  

## 2021-10-22 NOTE — Telephone Encounter (Signed)
Pt c/o of Chest Pain: STAT if CP now or developed within 24 hours  1. Are you having CP right now?  No   2. Are you experiencing any other symptoms (ex. SOB, nausea, vomiting, sweating)? No not right now   3. How long have you been experiencing CP? Pt stated the chest pain and palpations started about 5 days after the fall   4. Is your CP continuous or coming and going? When he was laying a certain way   5. Have you taken Nitroglycerin? No   Pt fell about 2 weeks ago , pt stated he has not had any of these pains in about 2 days now.   No other symptoms currently.   Pt stopped taking this med the last 2 days valsartan-hydrochlorothiazide (DIOVAN-HCT) 320-25 MG tablet [[id number]]    Best number [phone] ?

## 2021-10-23 ENCOUNTER — Telehealth: Payer: Self-pay | Admitting: Interventional Cardiology

## 2021-10-23 NOTE — Telephone Encounter (Signed)
Follow Up:     Patient is returning Kristie's call from yesterday. He said his wife will answer when you call back.

## 2021-10-23 NOTE — Telephone Encounter (Signed)
Spoke with patient who reports he "blacked out" and fell about 2 weeks ago, and about 5 days after this started having chest pain and palpitations. Patient denies SOB, N/V, or sweating. Patient reports pain present only when he "lays in certain positions."   Patient states he has not had any CP/palpitations for the last 3 days. He states he stopped his mucinex and valsartan-HCTZ about 2 days ago. He reports his dizziness is "a little better" since being off of valsartan-HCTZ. He reports his BP ranges 99/70 to 140/80 while being off of valsartan-HCTZ.  Discussed the above with Dr. Tamala Julian who recommends patient wear compression stockings, may stay off of valsartan-HCTZ, continue to monitor BP and call us if reading is 170/90 or greater. Follow-up OV scheduled for 12/12/21 at 1:40PM.  Patient verbalized understanding of the above.

## 2021-10-24 DIAGNOSIS — H6123 Impacted cerumen, bilateral: Secondary | ICD-10-CM | POA: Diagnosis not present

## 2021-10-24 DIAGNOSIS — H903 Sensorineural hearing loss, bilateral: Secondary | ICD-10-CM | POA: Diagnosis not present

## 2021-11-01 ENCOUNTER — Other Ambulatory Visit: Payer: Self-pay | Admitting: Internal Medicine

## 2021-11-01 DIAGNOSIS — R296 Repeated falls: Secondary | ICD-10-CM

## 2021-11-01 DIAGNOSIS — R202 Paresthesia of skin: Secondary | ICD-10-CM

## 2021-11-03 ENCOUNTER — Telehealth: Payer: Self-pay

## 2021-11-03 DIAGNOSIS — H401122 Primary open-angle glaucoma, left eye, moderate stage: Secondary | ICD-10-CM | POA: Diagnosis not present

## 2021-11-03 DIAGNOSIS — H401113 Primary open-angle glaucoma, right eye, severe stage: Secondary | ICD-10-CM | POA: Diagnosis not present

## 2021-11-03 DIAGNOSIS — Z961 Presence of intraocular lens: Secondary | ICD-10-CM | POA: Diagnosis not present

## 2021-11-03 DIAGNOSIS — H04123 Dry eye syndrome of bilateral lacrimal glands: Secondary | ICD-10-CM | POA: Diagnosis not present

## 2021-11-03 DIAGNOSIS — H2511 Age-related nuclear cataract, right eye: Secondary | ICD-10-CM | POA: Diagnosis not present

## 2021-11-03 NOTE — Chronic Care Management (AMB) (Signed)
Chronic Care Management Pharmacy Assistant   Name: Lawrence Bailey  MRN: [id number] DOB: 07/16/1940  Reason for Encounter: Disease State/ Hypertension  Recent office visits:  07-24-2021 Glendale Chard, MD. Referral placed to home health.  07-07-2021 Little, Claudette Stapler, RN (CCM).  Recent consult visits:  None  Hospital visits:  Medication Reconciliation was completed by comparing discharge summary, patient's EMR and Pharmacy list, and upon discussion with patient.  Admitted to the hospital on 09-30-2021 due to Exposure to STD. Discharge date was 09-30-2021. Discharged from [facility] health urgent care.   New?Medications Started at [facility] Discharge:?? None  Medication Changes at Hospital Discharge: None  Medications Discontinued at Hospital Discharge: None  Medications that remain the same after Hospital Discharge:??  -All other medications will remain the same.    Medications: Outpatient Encounter Medications as of 11/03/2021  Medication Sig   amLODipine (NORVASC) 10 MG tablet Take 10 mg by mouth daily.   aspirin 81 MG tablet Take 1 tablet (81 mg total) by mouth daily.   atorvastatin (LIPITOR) 10 MG tablet TAKE 1 TABLET BY MOUTH MONDAY - FRIDAY AT BEDTIME   brimonidine (ALPHAGAN) 0.2 % ophthalmic solution 1 drop 2 (two) times daily.   docusate sodium (COLACE) 100 MG capsule Take 100 mg by mouth 2 (two) times daily.   dorzolamide-timolol (COSOPT) 22.3-6.8 MG/ML ophthalmic solution Place 1 drop into both eyes 2 (two) times daily.   fluticasone (FLONASE) 50 MCG/ACT nasal spray INHALE 2 SPRAYS INTO EACH NOSTRIL EVERY NIGHT   latanoprost (XALATAN) 0.005 % ophthalmic solution Place 1 drop into both eyes at bedtime.   pantoprazole (PROTONIX) 40 MG tablet TAKE 1 TABLET BY MOUTH EVERY DAY   sildenafil (REVATIO) 20 MG tablet Take 20 mg by mouth 3 (three) times daily.   tamsulosin (FLOMAX) 0.4 MG CAPS capsule Take 0.4 mg by mouth.   No facility-administered encounter medications on  file as of 11/03/2021.   Reviewed chart prior to disease state call. Spoke with patient regarding BP  Recent Office Vitals: BP Readings from Last 3 Encounters:  09/30/21 (!) 164/93  07/24/21 122/70  02/05/21 138/70   Pulse Readings from Last 3 Encounters:  09/30/21 64  07/24/21 69  02/05/21 (!) 51    Wt Readings from Last 3 Encounters:  07/24/21 182 lb 3.2 oz (82.6 kg)  01/30/21 184 lb (83.5 kg)  11/27/20 186 lb 9.6 oz (84.6 kg)     Kidney Function Lab Results  Component Value Date/Time   CREATININE 1.22 01/30/2021 10:12 AM   CREATININE 1.44 (H) 07/30/2020 09:21 AM   CREATININE 1.5 (H) 04/10/2014 09:46 AM   CREATININE 1.8 (H) 04/14/2013 09:14 AM   GFRNONAA 60 (L) 01/30/2021 10:12 AM   GFRAA 62 07/09/2020 03:30 PM   GFRAA >60 02/01/2020 09:24 AM       Latest Ref Rng & Units 01/30/2021   10:12 AM 07/30/2020    9:21 AM 07/09/2020    3:30 PM  BMP  Glucose 70 - 99 mg/dL 78  110  103   BUN 8 - 23 mg/dL '21  22  26   '$ Creatinine 0.61 - 1.24 mg/dL 1.22  1.44  1.26   BUN/Creat Ratio 10 - 24   21   Sodium 135 - 145 mmol/L 135  137  141   Potassium 3.5 - 5.1 mmol/L 4.2  3.4  4.4   Chloride 98 - 111 mmol/L 100  103  102   CO2 22 - 32 mmol/L 30  29  24  Calcium 8.9 - 10.3 mg/dL 9.5  9.2  9.6     Current antihypertensive regimen:  Amlodipine 10 mg daily Valsartan/HCTZ 320-25 mg daily  How often are you checking your Blood Pressure? 3-5x per week  Current home BP readings: 135/76, 140/72  What recent interventions/DTPs have been made by any provider to improve Blood Pressure control since last CPP Visit:  Educated on Daily salt intake goal < 2300 mg; Importance of home blood pressure monitoring; Proper BP monitoring technique; -Congratulate patient on checking his BP at home -Counseled to monitor BP at home at least twice per day, document, and provide log at future appointments -Recommended to continue current medication  Any recent hospitalizations or ED visits since  last visit with CPP? Yes  What diet changes have been made to improve Blood Pressure Control?  Patient stated he limits salt intake and drinks plenty of water daily.  What exercise is being done to improve your Blood Pressure Control?  Patient stated he is active daily and is starting a therapy program next week.  Adherence Review: Is the patient currently on ACE/ARB medication? Yes Does the patient have >5 day gap between last estimated fill dates? No    Care Gaps: Shingrix overdue Covid booster overdue AWV 11-20-2021  Star Rating Drugs: Atorvastatin 10 mg- Last filled 10-27-2021 [facility] Valsartan/HCTZ 320-25 mg- Last filled 08-22-2021 [facility]  [place] Clinical Pharmacist Assistant [phone]

## 2021-11-11 ENCOUNTER — Other Ambulatory Visit: Payer: Self-pay

## 2021-11-11 MED ORDER — VALSARTAN-HYDROCHLOROTHIAZIDE 320-25 MG PO TABS: 1.0000 | ORAL_TABLET | Freq: Every day | ORAL | 1 refills | Status: DC

## 2021-11-12 ENCOUNTER — Other Ambulatory Visit: Payer: Self-pay

## 2021-11-17 ENCOUNTER — Ambulatory Visit: Payer: No Typology Code available for payment source | Admitting: Physical Therapy

## 2021-11-19 NOTE — Therapy (Signed)
OUTPATIENT PHYSICAL THERAPY LOWER EXTREMITY EVALUATION   Patient Name: Lawrence Bailey MRN: [id number] DOB:Apr 28, 1941, 81 y.o., male Today's Date: 11/20/2021   PT End of Session - 11/20/21 0946     Visit Number 1    Number of Visits 17    Date for PT Re-Evaluation 01/22/22    Authorization Type [employer]    PT Start Time [phone]    PT Stop Time 0958    PT Time Calculation (min) 48 min    Equipment Utilized During Treatment Gait belt    Activity Tolerance Patient tolerated treatment well    Behavior During Therapy WFL for tasks assessed/performed             Past Medical History:  Diagnosis Date   BPH (benign prostatic hyperplasia)    Coronary artery disease    GIST (gastrointestinal stromal tumor), malignant ([place]) dx'd 04/2007   gleevac comp 04/2008   Glaucoma    Hypertension    Prostate cancer ([place])    Renal insufficiency    Past Surgical History:  Procedure Laterality Date   ABDOMINAL SURGERY     EYE SURGERY     HERNIA REPAIR     KNEE SURGERY     Patient Active Problem List   Diagnosis Date Noted   Bilateral impacted cerumen 07/27/2021   Paresthesia of both feet 07/27/2021   Fall (on) (from) other stairs and steps, initial encounter 07/27/2021   Recurrent falls 07/27/2021   MGUS (monoclonal gammopathy of unknown significance) 07/27/2021   Chest pain 08/28/2020   Constipation 08/28/2020   Cough 08/28/2020   Dysphagia 08/28/2020   Hemorrhage of rectum and anus 08/28/2020   Atherosclerosis of aorta ([place]) 07/09/2020   Hypertensive nephropathy 05/02/2020   Malignant neoplasm of prostate ([place]) 07/11/2019   Glaucoma 02/28/2018   Syncope and collapse    Syncope 11/08/2016   Stage 3 chronic kidney disease ([place]) 11/08/2016   Essential hypertension 11/08/2016   Hyperglycemia 11/08/2016   Pulmonary contusion 02/04/2012   Thigh hematoma 02/04/2012   H/O malignant gastrointestinal stromal tumor (GIST) 07/20/2011    PCP: Glendale Chard, MD  REFERRING  PROVIDER: Glendale Chard, MD  REFERRING DIAG:  R29.6 (ICD-10-CM) - Recurrent falls  R20.2 (ICD-10-CM) - Paresthesia of both feet    THERAPY DIAG:  Difficulty in walking, not elsewhere classified  Muscle weakness (generalized)  Unsteadiness on feet  Rationale for Evaluation and Treatment Rehabilitation  ONSET DATE: Several years ago  SUBJECTIVE:   SUBJECTIVE STATEMENT: Pt reports primary c/o balance, strength, and endurance impairments lasting several years. He reports four falls in the past six months, including his most recent fall about 2-3 months ago resulting from a syncopal episode after a day of heavy activity. He reports he was dehydrated at the time. His other falls have been from tripping on objects or simply losing his balance. He denies any orthopedic injuries resulting from any of these falls. He also reports regular tingling in his feet, which has also been present for several years. He reports that he has been told he is pre-diabetic for years, but he does not have a diagnosis of DMII. He reports that prior to his recent falls, he walked 5-6 miles every morning and tried to stay very active. His recent problems have prevented him from doing his regular exercises. He reports regular global joint pain, which he attributes to old age, although his balance is his primary complaint at this time.  PERTINENT HISTORY: Hx of stomach cx, prostate cx, hx of  stroke about 20 years, renal insufficiency, HTN, CAD  PAIN:  Are you having pain? Yes: NPRS scale: 2/10 Pain location: knees, hips, shoulders Pain description: achy  PRECAUTIONS: Fall  WEIGHT BEARING RESTRICTIONS No  FALLS:  Has patient fallen in last 6 months? Yes. Number of falls 4  LIVING ENVIRONMENT: Lives with: lives with their spouse Lives in: House/apartment Stairs: Yes: Internal: 15 steps; on right going up, on left going up, and can reach both and External: 6 steps; on right going up, on left going up, and can  reach both Has following equipment at home: None  OCCUPATION: Retired  PLOF: [place] Return to walking for exercise, improve strength   OBJECTIVE:   DIAGNOSTIC FINDINGS: None related to primary problem  PATIENT SURVEYS:  FOTO 53%, predicted 57% in 11 visits  COGNITION:  Overall cognitive status: Within functional limits for tasks assessed     SENSATION: Not tested   MUSCLE LENGTH: Hamstrings: moderate tightness BIL Thomas test: moderate tightness BIL  POSTURE: rounded shoulders, decreased lumbar lordosis, increased thoracic kyphosis, and flexed trunk     LOWER EXTREMITY MMT:  MMT Right eval Left eval  Hip flexion 4/5 4/5  Hip extension    Hip abduction    Hip adduction    Knee flexion 4+/5 4+/5  Knee extension 4+/5 4+/5  Ankle dorsiflexion 4+/5 4+/5  Ankle plantarflexion 4+/5 4+/5   (Blank rows = not tested)  BERG BALANCE TEST Sitting to Standing: 3.      Stands independently using hands Standing Unsupported: 4.      Stands safely for 2 minutes Sitting Unsupported: 4.     Sits for 2 minutes independently Standing to Sitting: 3.     Controls descent with hands  Transfers: 3.     Transfers safely definite use of hands Standing with eyes closed: 3.     Stands 10 seconds with supervision Standing with feet together: 3.     Stands for 1 minute with supervision Reaching forward with outstretched arm: 4.     Reaches forward 10 inches Retrieving object from the floor: 2.     Unable to pick up, but reaches within 1-2 inches independently Turning to look behind: 4.     Looks behind from both sides and weight shifts well Turning 360 degrees: 4.     Able to turn in </=4 seconds  Place alternate foot on stool: 4.     Completes 8 steps in 20 seconds     Standing with one foot in front: 0.     Loses balance while standing/stepping Standing on one foot: 1.     Holds <3 seconds  Total Score: 42/56   FUNCTIONAL TESTS:  5 times sit to stand: 20  seconds with regular posterior LOB Timed up and go (TUG): Assess next visit Squat: 25%, limited by posterior LO and knee pain  GAIT: Distance walked: 20 ft Assistive device utilized: None Level of assistance: Complete Independence Comments: Decreased gait speed and stride length    TODAY'S TREATMENT: 11/20/2021: Demonstrated and issued HEP   PATIENT EDUCATION:  Education details: Pt educated on prognosis, POC, FOTO, and HEP Person educated: Patient Education method: Consulting civil engineer, Demonstration, and Handouts Education comprehension: verbalized understanding and returned demonstration   HOME EXERCISE PROGRAM: Access Code: [email or web address] URL: [email or web address] Date: 11/20/2021 Prepared by: Vanessa Dunlap  Exercises - Squat with Chair Touch  - 1 x daily - 7 x weekly - 2 sets - 10 reps - Standing Tandem Balance with  Counter Support  - 1 x daily - 7 x weekly - 3 sets - 30-sec hold - Heel Raises with Counter Support  - 1 x daily - 7 x weekly - 3 sets - 10 reps - Sidelying Hip Abduction  - 1 x daily - 7 x weekly - 2 sets - 10 reps - 3-sec hold  ASSESSMENT:  CLINICAL IMPRESSION: Patient is a 81 y.o. M who was seen today for physical therapy evaluation and treatment for chronic balance and endurance deficits.  Upon assessment, his primary impairments include decreased balance with tandem and single leg stance, poor transfers, weak global BIL LE MMT, tight BIL hip flexors and hamstrings, and limited squatting. He will benefit from skilled PT to address his primary impairments and return to his prior level of function with less limitation.    OBJECTIVE IMPAIRMENTS Abnormal gait, decreased activity tolerance, decreased balance, decreased endurance, decreased mobility, difficulty walking, decreased strength, increased edema, impaired flexibility, impaired sensation, improper body mechanics, and pain.   ACTIVITY LIMITATIONS carrying, lifting, bending, squatting, and  locomotion level  PARTICIPATION LIMITATIONS: shopping, community activity, and yard work  PERSONAL FACTORS Age, Time since onset of injury/illness/exacerbation, and 3+ comorbidities: See medical hx  are also affecting patient's functional outcome.   REHAB POTENTIAL: Fair Due to time since onset of impairments, multiple comorbidities, and age  CLINICAL DECISION MAKING: Stable/uncomplicated  EVALUATION COMPLEXITY: Low   GOALS: Goals reviewed with patient? Yes  SHORT TERM GOALS: Target date: 12/18/2021  Pt will report understanding and adherence to initial HEP in order to promote independence in the management of primary impairments. Baseline: HEP provided at eval Goal status: INITIAL   LONG TERM GOALS: Target date: 01/14/2022   Pt will achieve a FOTO score of 57% in order to demonstrate improved functional ability as it relates to his balance impairments. Baseline: 53% Goal status: INITIAL  2.  Pt will achieve a BERG score of 48/56 or higher in order to demonstrate improved functional balance. Baseline: 42/56 Goal status: INITIAL  3.  Pt will achieve a 5xSTS in 14 seconds or less without LOB in order to demonstrate safe functional transfers. Baseline: 20 seconds with posterior LOB Goal status: INITIAL  4.  Pt will achieve BIL global LE strength of 5/5 in order to return to walking for exercise with less limitation. Baseline: See MMT chart Goal status: INITIAL    PLAN: PT FREQUENCY: 2x/week  PT DURATION: 8 weeks  PLANNED INTERVENTIONS: Therapeutic exercises, Therapeutic activity, Neuromuscular re-education, Balance training, Gait training, Patient/Family education, Joint mobilization, Stair training, Aquatic Therapy, Dry Needling, Electrical stimulation, Cryotherapy, Taping, Vasopneumatic device, Biofeedback, Manual therapy, and Re-evaluation  PLAN FOR NEXT SESSION: Progress LE strengthening, dynamic and static balance training   Vanessa De Queen, PT, DPT 11/20/21  10:44 AM

## 2021-11-20 ENCOUNTER — Encounter: Payer: Self-pay | Admitting: Internal Medicine

## 2021-11-20 ENCOUNTER — Ambulatory Visit: Payer: No Typology Code available for payment source

## 2021-11-20 ENCOUNTER — Ambulatory Visit (INDEPENDENT_AMBULATORY_CARE_PROVIDER_SITE_OTHER): Payer: No Typology Code available for payment source

## 2021-11-20 ENCOUNTER — Other Ambulatory Visit: Payer: Self-pay

## 2021-11-20 ENCOUNTER — Ambulatory Visit (INDEPENDENT_AMBULATORY_CARE_PROVIDER_SITE_OTHER): Payer: No Typology Code available for payment source | Admitting: Internal Medicine

## 2021-11-20 ENCOUNTER — Ambulatory Visit: Payer: No Typology Code available for payment source | Attending: Internal Medicine

## 2021-11-20 VITALS — BP 136/80 | HR 69 | Temp 98.5°F | Ht 67.8 in | Wt 188.0 lb

## 2021-11-20 DIAGNOSIS — Z0000 Encounter for general adult medical examination without abnormal findings: Secondary | ICD-10-CM

## 2021-11-20 DIAGNOSIS — I129 Hypertensive chronic kidney disease with stage 1 through stage 4 chronic kidney disease, or unspecified chronic kidney disease: Secondary | ICD-10-CM

## 2021-11-20 DIAGNOSIS — K219 Gastro-esophageal reflux disease without esophagitis: Secondary | ICD-10-CM

## 2021-11-20 DIAGNOSIS — Z6828 Body mass index (BMI) 28.0-28.9, adult: Secondary | ICD-10-CM | POA: Diagnosis not present

## 2021-11-20 DIAGNOSIS — R202 Paresthesia of skin: Secondary | ICD-10-CM | POA: Insufficient documentation

## 2021-11-20 DIAGNOSIS — R2681 Unsteadiness on feet: Secondary | ICD-10-CM | POA: Insufficient documentation

## 2021-11-20 DIAGNOSIS — R262 Difficulty in walking, not elsewhere classified: Secondary | ICD-10-CM | POA: Diagnosis not present

## 2021-11-20 DIAGNOSIS — R5383 Other fatigue: Secondary | ICD-10-CM | POA: Diagnosis not present

## 2021-11-20 DIAGNOSIS — M6281 Muscle weakness (generalized): Secondary | ICD-10-CM | POA: Insufficient documentation

## 2021-11-20 DIAGNOSIS — N1831 Chronic kidney disease, stage 3a: Secondary | ICD-10-CM

## 2021-11-20 DIAGNOSIS — E559 Vitamin D deficiency, unspecified: Secondary | ICD-10-CM

## 2021-11-20 DIAGNOSIS — R296 Repeated falls: Secondary | ICD-10-CM | POA: Insufficient documentation

## 2021-11-20 MED ORDER — CYANOCOBALAMIN 1000 MCG/ML IJ SOLN
1000.0000 ug | Freq: Once | INTRAMUSCULAR | Status: AC
Start: 2021-11-20 — End: 2021-11-20
  Administered 2021-11-20: 1000 ug via INTRAMUSCULAR

## 2021-11-20 MED ORDER — FAMOTIDINE 20 MG PO TABS: 20.0000 mg | ORAL_TABLET | Freq: Every day | ORAL | 1 refills | Status: DC

## 2021-11-20 NOTE — Progress Notes (Signed)
Lawrence Bailey,acting as a Education administrator for Lawrence Greenland, MD.,have documented all relevant documentation on the behalf of Lawrence Greenland, MD,as directed by  Lawrence Greenland, MD while in the presence of Lawrence Greenland, MD.  This visit occurred during the SARS-CoV-2 public health emergency.  Safety protocols were in place, including screening questions prior to the visit, additional usage of staff PPE, and extensive cleaning of exam room while observing appropriate contact time as indicated for disinfecting solutions.  Subjective:     Patient ID: Lawrence Bailey , male    DOB: Nov 27, 1940 , 81 y.o.   MRN: [id number]   Chief Complaint  Patient presents with   Hypertension    HPI  He presents today for BP check.  He reports compliance with meds. She denies headaches, chest pain and shortness of breath. However, he states he feels "funny" after taking his meds in the morning. He states he was advised by his cardiologist to take valsartan/hctz at night. He does have nocturia when meds are taken at night.   Hypertension This is a chronic problem. The current episode started more than 1 year ago. The problem has been gradually improving since onset. The problem is controlled. Pertinent negatives include no blurred vision, chest pain or headaches. Risk factors for coronary artery disease include male gender. The current treatment provides moderate improvement. Hypertensive end-organ damage includes kidney disease.     Past Medical History:  Diagnosis Date   BPH (benign prostatic hyperplasia)    Coronary artery disease    GIST (gastrointestinal stromal tumor), malignant ([place]) dx'd 04/2007   gleevac comp 04/2008   Glaucoma    Hypertension    Prostate cancer ([place])    Renal insufficiency      Family History  Problem Relation Age of Onset   Hypertension Mother    Cancer Mother    Breast cancer Neg Hx    Prostate cancer Neg Hx    Colon cancer Neg Hx    Pancreatic cancer Neg Hx       Current Outpatient Medications:    amLODipine (NORVASC) 10 MG tablet, Take 10 mg by mouth daily., Disp: , Rfl: 3   aspirin 81 MG tablet, Take 1 tablet (81 mg total) by mouth daily., Disp: 30 tablet, Rfl:    atorvastatin (LIPITOR) 10 MG tablet, TAKE 1 TABLET BY MOUTH MONDAY - FRIDAY AT BEDTIME, Disp: 90 tablet, Rfl: 1   brimonidine (ALPHAGAN) 0.2 % ophthalmic solution, 1 drop 2 (two) times daily., Disp: , Rfl:    docusate sodium (COLACE) 100 MG capsule, Take 100 mg by mouth 2 (two) times daily., Disp: , Rfl:    dorzolamide-timolol (COSOPT) 22.3-6.8 MG/ML ophthalmic solution, Place 1 drop into both eyes 2 (two) times daily., Disp: , Rfl: 0   fluticasone (FLONASE) 50 MCG/ACT nasal spray, INHALE 2 SPRAYS INTO EACH NOSTRIL EVERY NIGHT, Disp: 16 mL, Rfl: 5   latanoprost (XALATAN) 0.005 % ophthalmic solution, Place 1 drop into both eyes at bedtime., Disp: , Rfl:    pantoprazole (PROTONIX) 40 MG tablet, TAKE 1 TABLET BY MOUTH EVERY DAY, Disp: 90 tablet, Rfl: 1   sildenafil (REVATIO) 20 MG tablet, Take 20 mg by mouth 3 (three) times daily., Disp: , Rfl:    tamsulosin (FLOMAX) 0.4 MG CAPS capsule, Take 0.4 mg by mouth., Disp: , Rfl:    valsartan-hydrochlorothiazide (DIOVAN-HCT) 320-25 MG tablet, Take 1 tablet by mouth daily., Disp: 90 tablet, Rfl: 1   Cholecalciferol (VITAMIN D3) 125 MCG (5000  UT) CAPS, Take 5,000 capsules by mouth daily., Disp: , Rfl:    famotidine (PEPCID) 20 MG tablet, TAKE 1 TABLET BY MOUTH EVERY DAY, Disp: 30 tablet, Rfl: 1   finasteride (PROSCAR) 5 MG tablet, Take 5 mg by mouth daily., Disp: , Rfl:    traMADol (ULTRAM) 50 MG tablet, Take 1 tablet (50 mg total) by mouth every 6 (six) hours as needed., Disp: 20 tablet, Rfl: 0   Allergies  Allergen Reactions   Other     Bee sting    Sucralfate Other (See Comments)     Review of Systems  Constitutional:  Positive for fatigue.  Eyes:  Negative for blurred vision.  Respiratory: Negative.    Cardiovascular: Negative.   Negative for chest pain.  Gastrointestinal: Negative.   Musculoskeletal: Negative.   Skin: Negative.   Neurological: Negative.  Negative for headaches.  Psychiatric/Behavioral: Negative.       Today's Vitals   11/20/21 1511  BP: 136/80  Pulse: 69  Temp: 98.5 F (36.9 C)  Weight: 188 lb (85.3 kg)  Height: 5' 7.8" (1.722 m)  PainSc: 0-No pain   Body mass index is 28.75 kg/m.  Wt Readings from Last 3 Encounters:  12/12/21 184 lb 3.2 oz (83.6 kg)  11/20/21 188 lb (85.3 kg)  11/20/21 188 lb (85.3 kg)    BP Readings from Last 3 Encounters:  12/12/21 (!) 142/84  12/04/21 139/85  11/20/21 136/80     Physical Exam Vitals and nursing note reviewed.  Constitutional:      Appearance: Normal appearance.  HENT:     Head: Normocephalic and atraumatic.  Eyes:     Extraocular Movements: Extraocular movements intact.  Cardiovascular:     Rate and Rhythm: Normal rate and regular rhythm.     Heart sounds: Normal heart sounds.  Pulmonary:     Effort: Pulmonary effort is normal.     Breath sounds: Normal breath sounds.  Musculoskeletal:     Cervical back: Normal range of motion.  Skin:    General: Skin is warm.  Neurological:     General: No focal deficit present.     Mental Status: He is alert.  Psychiatric:        Mood and Affect: Mood normal.       Assessment And Plan:     1. Hypertensive nephropathy Comments: Chronic, fair control. Goal BP<130/80. Due to nocturia, he may do better taking valsartan/hctz w/ evening meal. I will check renal function today.  - CMP14+EGFR - CBC no Diff  2. Stage 3a chronic kidney disease ([name]) Comments: Chronic, reminded to avoid NSAIDs, stay hydrated and keep BP controlled to decrease risk of CKD progression. I will check Renal labs as below.  - CMP14+EGFR - PTH, intact and calcium - Phosphorus  3. Fatigue, unspecified type Comments: I will check labs as below. He was also given vit b12 x 1. He will let me know if this helps with his  sx.  - cyanocobalamin ((VITAMIN B-12)) injection 1,000 mcg - CMP14+EGFR - CBC no Diff  4. Vitamin D deficiency disease Comments: I will check a vitamin D level and supplement as needed.  - Vitamin D (25 hydroxy)  5. Gastroesophageal reflux disease without esophagitis Comments: He is on PPI therapy, he is at risk for low vit b12 levels. Reminded to stop eating 3 hours prior to lying down.   6. BMI 28.0-28.9,adult Comments: He is encouraged to perform chair exercises while watching TV.    Patient was given  opportunity to ask questions. Patient verbalized understanding of the plan and was able to repeat key elements of the plan. All questions were answered to their satisfaction.   I, Lawrence Greenland, MD, have reviewed all documentation for this visit. The documentation on 11/20/21 for the exam, diagnosis, procedures, and orders are all accurate and complete.   IF YOU HAVE BEEN REFERRED TO A SPECIALIST, IT MAY TAKE 1-2 WEEKS TO SCHEDULE/PROCESS THE REFERRAL. IF YOU HAVE NOT HEARD FROM US/SPECIALIST IN TWO WEEKS, PLEASE GIVE [place] A CALL AT [phone] X [phone].   THE PATIENT IS ENCOURAGED TO PRACTICE SOCIAL DISTANCING DUE TO THE COVID-19 PANDEMIC.

## 2021-11-20 NOTE — Progress Notes (Signed)
Subjective:   Lawrence Bailey is a 81 y.o. male who presents for Medicare Annual/Subsequent preventive examination.  Review of Systems     Cardiac Risk Factors include: advanced age (>69mn, >>55women);hypertension;male gender     Objective:    Today's Vitals   11/20/21 1600  BP: 136/80  Pulse: 69  Temp: 98.5 F (36.9 C)  TempSrc: Oral  Weight: 188 lb (85.3 kg)  Height: 5' 7.8" (1.722 m)   Body mass index is 28.75 kg/m.     11/20/2021    4:03 PM 11/20/2021    9:18 AM 11/13/2020    2:17 PM 07/25/2020   10:04 AM 11/01/2019   11:59 AM 07/11/2019    1:31 PM 05/25/2019   12:52 PM  Advanced Directives  Does Patient Have a Medical Advance Directive? No No No No No No No  Would patient like information on creating a medical advance directive? No - Patient declined Yes (MAU/Ambulatory/Procedural Areas - Information given) No - Patient declined Yes (MAU/Ambulatory/Procedural Areas - Information given) Yes (MAU/Ambulatory/Procedural Areas - Information given) Yes (MAU/Ambulatory/Procedural Areas - Information given) No - Patient declined    Current Medications (verified) Outpatient Encounter Medications as of 11/20/2021  Medication Sig   amLODipine (NORVASC) 10 MG tablet Take 10 mg by mouth daily.   aspirin 81 MG tablet Take 1 tablet (81 mg total) by mouth daily.   atorvastatin (LIPITOR) 10 MG tablet TAKE 1 TABLET BY MOUTH MONDAY - FRIDAY AT BEDTIME   brimonidine (ALPHAGAN) 0.2 % ophthalmic solution 1 drop 2 (two) times daily.   docusate sodium (COLACE) 100 MG capsule Take 100 mg by mouth 2 (two) times daily.   dorzolamide-timolol (COSOPT) 22.3-6.8 MG/ML ophthalmic solution Place 1 drop into both eyes 2 (two) times daily.   famotidine (PEPCID) 20 MG tablet Take 1 tablet (20 mg total) by mouth daily.   fluticasone (FLONASE) 50 MCG/ACT nasal spray INHALE 2 SPRAYS INTO EACH NOSTRIL EVERY NIGHT   latanoprost (XALATAN) 0.005 % ophthalmic solution Place 1 drop into both eyes at bedtime.    pantoprazole (PROTONIX) 40 MG tablet TAKE 1 TABLET BY MOUTH EVERY DAY   sildenafil (REVATIO) 20 MG tablet Take 20 mg by mouth 3 (three) times daily.   tamsulosin (FLOMAX) 0.4 MG CAPS capsule Take 0.4 mg by mouth.   valsartan-hydrochlorothiazide (DIOVAN-HCT) 320-25 MG tablet Take 1 tablet by mouth daily.   [EXPIRED] cyanocobalamin ((VITAMIN B-12)) injection 1,000 mcg    No facility-administered encounter medications on file as of 11/20/2021.    Allergies (verified) Other and Sucralfate   History: Past Medical History:  Diagnosis Date   BPH (benign prostatic hyperplasia)    Coronary artery disease    [place]   gleevac comp 04/2008   Glaucoma    Hypertension    [place]    Renal insufficiency    Past Surgical History:  Procedure Laterality Date   ABDOMINAL SURGERY     EYE SURGERY     HERNIA REPAIR     KNEE SURGERY     Family History  Problem Relation Age of Onset   Hypertension Mother    Cancer Mother    Breast cancer Neg Hx    Prostate cancer Neg Hx    Colon cancer Neg Hx    Pancreatic cancer Neg Hx    Social History   Socioeconomic History   Marital status: Married    Spouse name: Not on file   Number of children:  Not on file   Years of education: Not on file   Highest education level: Not on file  Occupational History   Occupation: retired  Tobacco Use   Smoking status: Never   Smokeless tobacco: Never  Vaping Use   Vaping Use: Never used  Substance and Sexual Activity   Alcohol use: Not Currently   Drug use: No   Sexual activity: Yes  Other Topics Concern   Not on file  Social History Narrative   Not on file   Social Determinants of Health   Financial Resource Strain: Low Risk  (11/20/2021)   Overall Financial Resource Strain (CARDIA)    Difficulty of Paying Living Expenses: Not hard at all  Food Insecurity: No Food Insecurity (11/20/2021)   Hunger Vital Sign    Worried About  Running Out of Food in the Last Year: Never true    [place] in the Last Year: Never true  Transportation Needs: No Transportation Needs (11/20/2021)   PRAPARE - Hydrologist (Medical): No    Lack of Transportation (Non-Medical): No  Physical Activity: Inactive (11/20/2021)   Exercise Vital Sign    Days of Exercise per Week: 0 days    Minutes of Exercise per Session: 0 min  Stress: No Stress Concern Present (11/20/2021)   [place]    Feeling of Stress : Not at all  Social Connections: Not on file    Tobacco Counseling Counseling given: Not Answered   Clinical Intake:  Pre-visit preparation completed: Yes  Pain : No/denies pain     Nutritional Status: BMI 25 -29 Overweight Nutritional Risks: None Diabetes: No  How often do you need to have someone help you when you read instructions, pamphlets, or other written materials from your doctor or pharmacy?: 1 - Never  Diabetic? no  Interpreter Needed?: No  Information entered by :: NAllen LPN   Activities of Daily Living    11/20/2021    4:04 PM  In your present state of health, do you have any difficulty performing the following activities:  Hearing? 1  Vision? 1  Comment decreased vision in left eye  Difficulty concentrating or making decisions? 0  Walking or climbing stairs? 0  Dressing or bathing? 0  Doing errands, shopping? 0  Preparing Food and eating ? N  Using the Toilet? N  In the past six months, have you accidently leaked urine? N  Do you have problems with loss of bowel control? N  Managing your Medications? N  Managing your Finances? N  Housekeeping or managing your Housekeeping? N    Patient Care Team: Glendale Chard, MD as PCP - General (Internal Medicine) Johnathan Hausen, MD as Consulting Physician (General Surgery) Carol Ada, MD as Consulting Physician (Gastroenterology) Charolette Forward, MD  as Consulting Physician (Cardiology) Warden Fillers, MD as Consulting Physician (Ophthalmology) Daneen Schick as Social Worker [place], Claudette Stapler, RN as [place] [facility], Sharyn Blitz, [facility] (Pharmacist) Glendale Chard, MD as Referring Physician (Internal Medicine)  Indicate any recent Medical Services you may have received from other than [facility] providers in the past year (date may be approximate).     Assessment:   This is a routine wellness examination for Lawrence Bailey.  Hearing/Vision screen Vision Screening - Comments:: Regular eye exams, Dr. Katy Fitch  Dietary issues and exercise activities discussed: Current Exercise Habits: The patient does not participate in regular exercise at present   Goals Addressed  This Visit's Progress    Patient Stated       11/20/2021, just keep moving       Depression Screen    11/20/2021    4:04 PM 11/20/2021    3:12 PM 11/13/2020    2:17 PM 11/01/2019   12:01 PM 11/01/2019   11:20 AM 10/25/2018   10:26 AM 06/15/2018    2:12 PM  PHQ 2/9 Scores  PHQ - 2 Score 0 0 0 0 0 0 0  PHQ- 9 Score    0  0     Fall Risk    11/20/2021    4:04 PM 11/20/2021    3:12 PM 11/13/2020    2:17 PM 11/01/2019   12:00 PM 11/01/2019   11:20 AM  [place] in the past year? 0 0 0 0 0  Number falls in past yr: 0 0     Injury with Fall? 0 0     Risk for fall due to : Medication side effect No Fall Risks Medication side effect Medication side effect   Follow up Falls evaluation completed;Education provided;Falls prevention discussed Falls evaluation completed Falls evaluation completed;Education provided;Falls prevention discussed Falls evaluation completed;Education provided;Falls prevention discussed     FALL RISK PREVENTION PERTAINING TO THE HOME:  Any stairs in or around the home? Yes  If so, are there any without handrails? No  Home free of loose throw rugs in walkways, pet beds, electrical cords, etc? Yes  Adequate  lighting in your home to reduce risk of falls? Yes   ASSISTIVE DEVICES UTILIZED TO PREVENT FALLS:  Life alert? No  Use of a cane, walker or w/c? No  Grab bars in the bathroom? No  Shower chair or bench in shower? No  Elevated toilet seat or a handicapped toilet? Yes   TIMED UP AND GO:  Was the test performed? No .      Cognitive Function:        11/20/2021    4:05 PM 11/13/2020    2:19 PM 11/01/2019   12:03 PM 10/25/2018   10:30 AM  6CIT Screen  What Year? 0 points 0 points 0 points 0 points  What month? 3 points 0 points 0 points 0 points  What time? 0 points 0 points 0 points 0 points  Count back from 20 0 points 0 points 2 points 0 points  Months in reverse 4 points 4 points 0 points 2 points  Repeat phrase 4 points 4 points 0 points 0 points  Total Score 11 points 8 points 2 points 2 points    Immunizations Immunization History  Administered Date(s) Administered   Fluad Quad(high Dose 65+) 03/15/2019, 07/09/2020   PFIZER(Purple Top)SARS-COV-2 Vaccination 07/01/2019, 07/24/2019, 05/24/2020, 09/22/2020   PNEUMOCOCCAL CONJUGATE-20 07/24/2021   Tdap 03/15/2019    TDAP status: Up to date  Flu Vaccine status: Up to date  Pneumococcal vaccine status: Up to date  Covid-19 vaccine status: Completed vaccines  Qualifies for Shingles Vaccine? Yes   Zostavax completed No   Shingrix Completed?: No.    Education has been provided regarding the importance of this vaccine. Patient has been advised to call insurance company to determine out of pocket expense if they have not yet received this vaccine. Advised may also receive vaccine at local pharmacy or Health Dept. Verbalized acceptance and understanding.  Screening Tests Health Maintenance  Topic Date Due   Zoster Vaccines- Shingrix (1 of 2) Never done   COVID-19 Vaccine (5 - Booster  for [place] series) 11/17/2020   INFLUENZA VACCINE  12/23/2021   TETANUS/TDAP  03/14/2029   Pneumonia Vaccine 27+ Years old  Completed    HPV VACCINES  Aged Out    Health Maintenance  Health Maintenance Due  Topic Date Due   Zoster Vaccines- Shingrix (1 of 2) Never done   COVID-19 Vaccine (5 - Booster for Pfizer series) 11/17/2020    Colorectal cancer screening: No longer required.   Lung Cancer Screening: (Low Dose CT Chest recommended if Age 28-80 years, 30 pack-year currently smoking OR have quit w/in 15years.) does not qualify.   Lung Cancer Screening Referral: no  Additional Screening:  Hepatitis C Screening: does not qualify;   Vision Screening: Recommended annual ophthalmology exams for early detection of glaucoma and other disorders of the eye. Is the patient up to date with their annual eye exam?  Yes  Who is the provider or what is the name of the office in which the patient attends annual eye exams? Dr. Katy Fitch If pt is not established with a provider, would they like to be referred to a provider to establish care? No .   Dental Screening: Recommended annual dental exams for proper oral hygiene  Community Resource Referral / Chronic Care Management: CRR required this visit?  No   CCM required this visit?  No      Plan:     I have personally reviewed and noted the following in the patient's chart:   Medical and social history Use of alcohol, tobacco or illicit drugs  Current medications and supplements including opioid prescriptions. Patient is not currently taking opioid prescriptions. Functional ability and status Nutritional status Physical activity Advanced directives List of other physicians Hospitalizations, surgeries, and ER visits in previous 12 months Vitals Screenings to include cognitive, depression, and falls Referrals and appointments  In addition, I have reviewed and discussed with patient certain preventive protocols, quality metrics, and best practice recommendations. A written personalized care plan for preventive services as well as general preventive health recommendations  were provided to patient.     Kellie Simmering, LPN   0/[id number]/[id number]   Nurse Notes: none

## 2021-11-20 NOTE — Patient Instructions (Signed)
Lawrence Bailey , Thank you for taking time to come for your Medicare Wellness Visit. I appreciate your ongoing commitment to your health goals. Please review the following plan we discussed and let me know if I can assist you in the future.   Screening recommendations/referrals: Colonoscopy: not required Recommended yearly ophthalmology/optometry visit for glaucoma screening and checkup Recommended yearly dental visit for hygiene and checkup  Vaccinations: Influenza vaccine: due 12/23/2021 Pneumococcal vaccine: completed 07/24/2021 Tdap vaccine: completed 03/15/2019, due 03/14/2029 Shingles vaccine: discussed   Covid-19:  09/22/2020, 05/24/2020, 07/24/2019, 07/01/2019  Advanced directives: Advance directive discussed with you today. Even though you declined this today please call our office should you change your mind and we can give you the proper paperwork for you to fill out.  Conditions/risks identified: none  Next appointment: Follow up in one year for your annual wellness visit.   Preventive Care 82 Years and Older, Male Preventive care refers to lifestyle choices and visits with your health care provider that can promote health and wellness. What does preventive care include? A yearly physical exam. This is also called an annual well check. Dental exams once or twice a year. Routine eye exams. Ask your health care provider how often you should have your eyes checked. Personal lifestyle choices, including: Daily care of your teeth and gums. Regular physical activity. Eating a healthy diet. Avoiding tobacco and drug use. Limiting alcohol use. Practicing safe sex. Taking low doses of aspirin every day. Taking vitamin and mineral supplements as recommended by your health care provider. What happens during an annual well check? The services and screenings done by your health care provider during your annual well check will depend on your age, overall health, lifestyle risk factors, and  family history of disease. Counseling  Your health care provider may ask you questions about your: Alcohol use. Tobacco use. Drug use. Emotional well-being. Home and relationship well-being. Sexual activity. Eating habits. History of falls. Memory and ability to understand (cognition). Work and work Statistician. Screening  You may have the following tests or measurements: Height, weight, and BMI. Blood pressure. Lipid and cholesterol levels. These may be checked every 5 years, or more frequently if you are over 29 years old. Skin check. Lung cancer screening. You may have this screening every year starting at age 43 if you have a 30-pack-year history of smoking and currently smoke or have quit within the past 15 years. Fecal occult blood test (FOBT) of the stool. You may have this test every year starting at age 36. Flexible sigmoidoscopy or colonoscopy. You may have a sigmoidoscopy every 5 years or a colonoscopy every 10 years starting at age 15. Prostate cancer screening. Recommendations will vary depending on your family history and other risks. Hepatitis C blood test. Hepatitis B blood test. Sexually transmitted disease (STD) testing. Diabetes screening. This is done by checking your blood sugar (glucose) after you have not eaten for a while (fasting). You may have this done every 1-3 years. Abdominal aortic aneurysm (AAA) screening. You may need this if you are a current or former smoker. Osteoporosis. You may be screened starting at age 52 if you are at high risk. Talk with your health care provider about your test results, treatment options, and if necessary, the need for more tests. Vaccines  Your health care provider may recommend certain vaccines, such as: Influenza vaccine. This is recommended every year. Tetanus, diphtheria, and acellular pertussis (Tdap, Td) vaccine. You may need a Td booster every 10 years. Zoster  vaccine. You may need this after age 26. Pneumococcal  13-valent conjugate (PCV13) vaccine. One dose is recommended after age 74. Pneumococcal polysaccharide (PPSV23) vaccine. One dose is recommended after age 16. Talk to your health care provider about which screenings and vaccines you need and how often you need them. This information is not intended to replace advice given to you by your health care provider. Make sure you discuss any questions you have with your health care provider. Document Released: 06/07/2015 Document Revised: 01/29/2016 Document Reviewed: 03/12/2015 Elsevier Interactive Patient Education  2017 [place] Prevention in the Home Falls can cause injuries. They can happen to people of all ages. There are many things you can do to make your home safe and to help prevent falls. What can I do on the outside of my home? Regularly fix the edges of walkways and driveways and fix any cracks. Remove anything that might make you trip as you walk through a door, such as a raised step or threshold. Trim any bushes or trees on the path to your home. Use bright outdoor lighting. Clear any walking paths of anything that might make someone trip, such as rocks or tools. Regularly check to see if handrails are loose or broken. Make sure that both sides of any steps have handrails. Any raised decks and porches should have guardrails on the edges. Have any leaves, snow, or ice cleared regularly. Use sand or salt on walking paths during winter. Clean up any spills in your garage right away. This includes oil or grease spills. What can I do in the bathroom? Use night lights. Install grab bars by the toilet and in the tub and shower. Do not use towel bars as grab bars. Use non-skid mats or decals in the tub or shower. If you need to sit down in the shower, use a plastic, non-slip stool. Keep the floor dry. Clean up any water that spills on the floor as soon as it happens. Remove soap buildup in the tub or shower regularly. Attach  bath mats securely with double-sided non-slip rug tape. Do not have throw rugs and other things on the floor that can make you trip. What can I do in the bedroom? Use night lights. Make sure that you have a light by your bed that is easy to reach. Do not use any sheets or blankets that are too big for your bed. They should not hang down onto the floor. Have a firm chair that has side arms. You can use this for support while you get dressed. Do not have throw rugs and other things on the floor that can make you trip. What can I do in the kitchen? Clean up any spills right away. Avoid walking on wet floors. Keep items that you use a lot in easy-to-reach places. If you need to reach something above you, use a strong step stool that has a grab bar. Keep electrical cords out of the way. Do not use floor polish or wax that makes floors slippery. If you must use wax, use non-skid floor wax. Do not have throw rugs and other things on the floor that can make you trip. What can I do with my stairs? Do not leave any items on the stairs. Make sure that there are handrails on both sides of the stairs and use them. Fix handrails that are broken or loose. Make sure that handrails are as long as the stairways. Check any carpeting to make sure that it  is firmly attached to the stairs. Fix any carpet that is loose or worn. Avoid having throw rugs at the top or bottom of the stairs. If you do have throw rugs, attach them to the floor with carpet tape. Make sure that you have a light switch at the top of the stairs and the bottom of the stairs. If you do not have them, ask someone to add them for you. What else can I do to help prevent falls? Wear shoes that: Do not have high heels. Have rubber bottoms. Are comfortable and fit you well. Are closed at the toe. Do not wear sandals. If you use a stepladder: Make sure that it is fully opened. Do not climb a closed stepladder. Make sure that both sides of the  stepladder are locked into place. Ask someone to hold it for you, if possible. Clearly mark and make sure that you can see: Any grab bars or handrails. First and last steps. Where the edge of each step is. Use tools that help you move around (mobility aids) if they are needed. These include: Canes. Walkers. Scooters. Crutches. Turn on the lights when you go into a dark area. Replace any light bulbs as soon as they burn out. Set up your furniture so you have a clear path. Avoid moving your furniture around. If any of your floors are uneven, fix them. If there are any pets around you, be aware of where they are. Review your medicines with your doctor. Some medicines can make you feel dizzy. This can increase your chance of falling. Ask your doctor what other things that you can do to help prevent falls. This information is not intended to replace advice given to you by your health care provider. Make sure you discuss any questions you have with your health care provider. Document Released: 03/07/2009 Document Revised: 10/17/2015 Document Reviewed: 06/15/2014 Elsevier Interactive Patient Education  2017 [employer].

## 2021-11-21 ENCOUNTER — Encounter: Payer: Self-pay | Admitting: Internal Medicine

## 2021-11-21 LAB — CMP14+EGFR
ALT: 13 IU/L (ref 0–44)
AST: 16 IU/L (ref 0–40)
Albumin/Globulin Ratio: 1 — ABNORMAL LOW (ref 1.2–2.2)
Albumin: 3.8 g/dL (ref 3.6–4.6)
Alkaline Phosphatase: 48 IU/L (ref 44–121)
BUN/Creatinine Ratio: 16 (ref 10–24)
BUN: 19 mg/dL (ref 8–27)
Bilirubin Total: 0.4 mg/dL (ref 0.0–1.2)
CO2: 28 mmol/L (ref 20–29)
Calcium: 9.4 mg/dL (ref 8.6–10.2)
Chloride: 99 mmol/L (ref 96–106)
Creatinine, Ser: 1.19 mg/dL (ref 0.76–1.27)
Globulin, Total: 4 g/dL (ref 1.5–4.5)
Glucose: 98 mg/dL (ref 70–99)
Potassium: 4 mmol/L (ref 3.5–5.2)
Sodium: 137 mmol/L (ref 134–144)
Total Protein: 7.8 g/dL (ref 6.0–8.5)
eGFR: 61 mL/min/{1.73_m2} (ref >59)

## 2021-11-21 LAB — CBC
Hematocrit: 34.2 % — ABNORMAL LOW (ref 37.5–51.0)
Hemoglobin: 11.1 g/dL — ABNORMAL LOW (ref 13.0–17.7)
MCH: 27.5 pg (ref 26.6–33.0)
MCHC: 32.5 g/dL (ref 31.5–35.7)
MCV: 85 fL (ref 79–97)
Platelets: 154 10*3/uL (ref 150–450)
RBC: 4.03 x10E6/uL — ABNORMAL LOW (ref 4.14–5.80)
RDW: 14 % (ref 11.6–15.4)
WBC: 5.8 10*3/uL (ref 3.4–10.8)

## 2021-11-21 LAB — PHOSPHORUS: Phosphorus: 3.3 mg/dL (ref 2.8–4.1)

## 2021-11-21 LAB — PTH, INTACT AND CALCIUM: PTH: 73 pg/mL — ABNORMAL HIGH (ref 15–65)

## 2021-11-21 LAB — VITAMIN D 25 HYDROXY (VIT D DEFICIENCY, FRACTURES): Vit D, 25-Hydroxy: 31.5 ng/mL (ref 30.0–100.0)

## 2021-11-24 ENCOUNTER — Ambulatory Visit (INDEPENDENT_AMBULATORY_CARE_PROVIDER_SITE_OTHER): Payer: No Typology Code available for payment source

## 2021-11-24 ENCOUNTER — Telehealth: Payer: No Typology Code available for payment source

## 2021-11-24 DIAGNOSIS — N1831 Chronic kidney disease, stage 3a: Secondary | ICD-10-CM

## 2021-11-24 DIAGNOSIS — I129 Hypertensive chronic kidney disease with stage 1 through stage 4 chronic kidney disease, or unspecified chronic kidney disease: Secondary | ICD-10-CM

## 2021-11-24 DIAGNOSIS — I700 Atherosclerosis of aorta: Secondary | ICD-10-CM

## 2021-11-24 DIAGNOSIS — D472 Monoclonal gammopathy: Secondary | ICD-10-CM

## 2021-11-24 NOTE — Patient Instructions (Signed)
Visit Information  Thank you for taking time to visit with me today. Please don't hesitate to contact me if I can be of assistance to you before our next scheduled telephone appointment.  Following are the goals we discussed today:  Take all medications as prescribed Attend all scheduled provider appointments Call pharmacy for medication refills 3-7 days in advance of running out of medications Perform all self care activities independently  Call provider office for new concerns or questions  check blood pressure daily keep a blood pressure log take blood pressure log to all doctor appointments call doctor for signs and symptoms of high blood pressure take medications for blood pressure exactly as prescribed begin an exercise program report new symptoms to your doctor Try diffusing Lavender essential oil and or showering/bathing with lavender body wash prior to bedtime Try to drink 6-8 glasses of water each day   Our next appointment is by telephone on 12/15/21 at 12 PM   Please call the care guide team at [phone] if you need to cancel or reschedule your appointment.   If you are experiencing a Mental Health or [place] or need someone to talk to, please call 1-800-273-TALK (toll free, 24 hour hotline)   Patient verbalizes understanding of instructions and care plan provided today and agrees to view in [place]. Active MyChart status and patient understanding of how to access instructions and care plan via MyChart confirmed with patient.     Barb Merino, RN, BSN, CCM Care Management Coordinator [place] [facility]  Direct Phone: [phone]

## 2021-11-24 NOTE — Chronic Care Management (AMB) (Signed)
Chronic Care Management   CCM RN Visit Note  11/24/2021 Name: Lawrence Bailey MRN: [id number] DOB: 1940-11-22  Subjective: Lawrence Bailey is a 81 y.o. year old male who is a primary care patient of Glendale Chard, MD. The care management team was consulted for assistance with disease management and care coordination needs.    Engaged with patient by telephone for follow up visit in response to provider referral for case management and/or care coordination services.   Consent to Services:  The patient was given information about Chronic Care Management services, agreed to services, and gave verbal consent prior to initiation of services.  Please see initial visit note for detailed documentation.   Patient agreed to services and verbal consent obtained.   Assessment: Review of patient past medical history, allergies, medications, health status, including review of consultants reports, laboratory and other test data, was performed as part of comprehensive evaluation and provision of chronic care management services.   SDOH (Social Determinants of Health) assessments and interventions performed:  Yes, no acute changes   CCM Care Plan  Allergies  Allergen Reactions   Other     Bee sting    Sucralfate Other (See Comments)    Outpatient Encounter Medications as of 11/24/2021  Medication Sig   Cholecalciferol (VITAMIN D3) 125 MCG (5000 UT) CAPS Take 5,000 capsules by mouth daily.   amLODipine (NORVASC) 10 MG tablet Take 10 mg by mouth daily.   aspirin 81 MG tablet Take 1 tablet (81 mg total) by mouth daily.   atorvastatin (LIPITOR) 10 MG tablet TAKE 1 TABLET BY MOUTH MONDAY - FRIDAY AT BEDTIME   brimonidine (ALPHAGAN) 0.2 % ophthalmic solution 1 drop 2 (two) times daily.   docusate sodium (COLACE) 100 MG capsule Take 100 mg by mouth 2 (two) times daily.   dorzolamide-timolol (COSOPT) 22.3-6.8 MG/ML ophthalmic solution Place 1 drop into both eyes 2 (two) times daily.   famotidine (PEPCID)  20 MG tablet Take 1 tablet (20 mg total) by mouth daily.   fluticasone (FLONASE) 50 MCG/ACT nasal spray INHALE 2 SPRAYS INTO EACH NOSTRIL EVERY NIGHT   latanoprost (XALATAN) 0.005 % ophthalmic solution Place 1 drop into both eyes at bedtime.   pantoprazole (PROTONIX) 40 MG tablet TAKE 1 TABLET BY MOUTH EVERY DAY   sildenafil (REVATIO) 20 MG tablet Take 20 mg by mouth 3 (three) times daily.   tamsulosin (FLOMAX) 0.4 MG CAPS capsule Take 0.4 mg by mouth.   valsartan-hydrochlorothiazide (DIOVAN-HCT) 320-25 MG tablet Take 1 tablet by mouth daily.   No facility-administered encounter medications on file as of 11/24/2021.    Patient Active Problem List   Diagnosis Date Noted   Bilateral impacted cerumen 07/27/2021   Paresthesia of both feet 07/27/2021   Fall (on) (from) other stairs and steps, initial encounter 07/27/2021   Recurrent falls 07/27/2021   MGUS (monoclonal gammopathy of unknown significance) 07/27/2021   Chest pain 08/28/2020   Constipation 08/28/2020   Cough 08/28/2020   Dysphagia 08/28/2020   Hemorrhage of rectum and anus 08/28/2020   Atherosclerosis of aorta ([place]) 07/09/2020   Hypertensive nephropathy 05/02/2020   Malignant neoplasm of prostate ([place]) 07/11/2019   Glaucoma 02/28/2018   Syncope and collapse    Syncope 11/08/2016   Stage 3 chronic kidney disease ([place]) 11/08/2016   Essential hypertension 11/08/2016   Hyperglycemia 11/08/2016   Pulmonary contusion 02/04/2012   Thigh hematoma 02/04/2012   H/O malignant gastrointestinal stromal tumor (GIST) 07/20/2011    Conditions to be addressed/monitored: Hypertensive nephropathy,  Atherosclerosis of aorta,  Stage 3a chronic kidney disease, Monoclonal gammopathy of unknown significance  Care Plan : RN Care Manager Plan of Care  Updates made by Lynne Logan, RN since 11/24/2021 12:00 AM     Problem: No plan of care established for management of chronic disease states (Hypertensive nephropathy, Atherosclerosis of aorta,   Stage 3a chronic kidney disease, Monoclonal gammopathy of unknown significance)   Priority: High     Long-Range Goal: Development of plan of care for chronic disease management for (Hypertensive nephropathy, Atherosclerosis of aorta,  Stage 3a chronic kidney disease, Monoclonal gammopathy of unknown significance)   Start Date: 05/05/2021  Expected End Date: 05/05/2022  Recent Progress: On track  Priority: High  Note:   Current Barriers:  Knowledge Deficits related to plan of care for management of Hypertensive nephropathy, Atherosclerosis of aorta,  Stage 3a chronic kidney disease, Monoclonal gammopathy of unknown significance  Chronic Disease Management support and education needs related to Hypertensive nephropathy, Atherosclerosis of aorta,  Stage 3a chronic kidney disease, Monoclonal gammopathy of unknown significance   RNCM Clinical Goal(s):  Patient will verbalize basic understanding of  Hypertensive nephropathy, Atherosclerosis of aorta,  Stage 3a chronic kidney disease, Monoclonal gammopathy of unknown significance disease process and self health management plan as evidenced by patient will report having no disease exacerbations related to his chronic disease states listed above take all medications exactly as prescribed and will call provider for medication related questions as evidenced by patient will report having no missed doses of his prescribed medications  demonstrate Ongoing health management independence as evidenced by patient will report 100% adherence to his prescribed treatment plan  continue to work with RN Care Manager to address care management and care coordination needs related to  Hypertensive nephropathy, Atherosclerosis of aorta,  Stage 3a chronic kidney disease, Monoclonal gammopathy of unknown significance as evidenced by adherence to CM Team Scheduled appointments demonstrate ongoing self health care management ability   as evidenced by    through collaboration  with RN Care manager, provider, and care team.   Interventions: 1:1 collaboration with primary care provider regarding development and update of comprehensive plan of care as evidenced by provider attestation and co-signature Inter-disciplinary care team collaboration (see longitudinal plan of care) Evaluation of current treatment plan related to  self management and patient's adherence to plan as established by provider   Chronic Kidney Disease Interventions:  (Status:  Condition stable.  Not addressed this visit.) Long Term Goal Evaluation of current treatment plan related to chronic kidney disease self management and patient's adherence to plan as established by provider      Reviewed prescribed diet increase water to 3-4 bottles per day (around 64 oz) Provided education on kidney disease progression    Discussed plans with patient for ongoing care management follow up and provided patient with direct contact information for care management team Last practice recorded BP readings:  BP Readings from Last 3 Encounters:  02/05/21 138/70  01/30/21 (!) 156/91  11/27/20 (!) 142/88  Most recent eGFR/CrCl: No results found for: EGFR  No components found for: CRCL  Monoclonal gammopathy of unknown significance (MGUS):  (Status:  Condition stable.  Not addressed this visit.)  Long Term Goal Evaluation of current treatment plan related to  Monoclonal gammopathy of unknown significance (MGUS) , self-management and patient's adherence to plan as established by provider. Evaluation of current treatment plan related to MGUS (Monoclonal gammopathy of unknown significance), self-management and patient's adherence to plan as established by  provider. Collaboration with Glendale Chard, MD regarding development and update of comprehensive plan of care as evidenced by provider attestation       and co-signature Inter-disciplinary care team collaboration (see longitudinal plan of care) Review of patient status,  including review of consultants reports, relevant laboratory and other test results, and medications completed. Determined patient completed follow up visit with Dr. 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 Pathology showed a 6 cm GI stromal tumor. No vascular or lymphatic invasion. All surgical margins negative. Lymph nodes not sampled. Tumor confined to the submucosa. Mitotic activity 25-50 mitoses per high-power field. He completed 1 year of Gleevec 400 mg daily. Restaging CT abdomen/pelvis 04/10/2014 with no evidence of recurrent/metastatic disease.  CT abdomen/pelvis 05/12/2018 without evidence of local recurrence or metastatic disease. History of right lower extremity DVT June 2018. Evaluation in the emergency department 03/08/2018 for right leg redness, swelling and tenderness.  Venous Doppler negative for DVT.  Incidental finding of a possible 3.3 cm right inguinal lymph node.  CT abdomen/pelvis 05/12/2018 showed stable small inguinal lymph nodes bilaterally. Chronic renal failure Monoclonal gammopathy of unknown significance 01/09/2019 SPEP with M spike 1.5  07/05/2019 SPEP with M spike 1.9 Hypertension Prostate cancer-on surveillance, followed by urology Disposition: Mr. Gully appears stable.  He is in clinical remission from the gastrointestinal stromal tumor.  There is no clinical or laboratory evidence for progression to multiple myeloma.  We will follow up on the myeloma panel from today.  He will return for an office and lab visit in 6 months. Determined patient verbalizes understanding of his prescribed treatment plan and recommendations for ongoing follow up Discussed plans with patient for ongoing care management follow up and provided patient with direct contact information for care management team   Hypertension Interventions:  (Status:  Goal on  track:  Yes.) Long Term Goal Last practice recorded BP readings:  BP Readings from Last 3 Encounters:  11/20/21 136/80  11/20/21 136/80  09/30/21 (!) 164/93  Most recent eGFR/CrCl:  Lab Results  Component Value Date   EGFR 61 11/20/2021    No components found for: "CRCL" Evaluation of current treatment plan related to hypertension self management and patient's adherence to plan as established by provider Reviewed medications with patient and discussed importance of medication adherence Advised patient, providing education and rationale, to monitor blood pressure daily and record, calling PCP for findings outside established parameters Provided verbal instructions to patient and wife on how to accurately monitor BP at home  Advised patient to discuss any/all BP concerns, or persistent light headedness and or syncope episodes with PCP provider promptly Educated patient/wife on importance of patient staying well hydrated with water, aiming for 48-64 oz daily unless otherwise directed  Assessed social determinant of health barriers Instructed patient/wife to check his blood pressure daily Instructed patient/wife to keep a blood pressure log Instructed patient/wife to take blood his pressure log to all doctor appointments Instructed patient/wife to call his doctor for signs and symptoms of high blood pressure Educated patient/wife on importance of taking his medications for blood pressure exactly as prescribed Encouraged patient to continue to work with PT as directed for help with balance and endurance  Fatigue Interventions:  (Status:  New goal. and Condition stable.  Not addressed this visit.)  Long Term Goal Evaluation of current treatment plan related to  Fatigue , self-management and patient's adherence to plan as  established by provider Discussed patient's complaints of persistent fatigue Review of patient status, including review of consultant's reports, relevant laboratory and other  test results, and medications completed Educated patient on the importance of keeping PCP informed of persistent fatigue Reviewed and discussed next scheduled follow up with PCP set for 11/20/21 @2  PM, determined patient does not feel the need to follow up before this time Assessed for sleep deprivation, determined patient falls asleep around 1:30 AM and awakens around 7:30 AM, patient states this has been a life long sleep schedule due to working different shifts during his working years  Educated patient on the importance of getting adequate amount of sleep, focusing on getting sound sleep to help feel rested upon awakening Encouraged patient on the importance to stay well hydrated, exercise 30 minutes each day during most energetic time of day and 30 minutes (as tolerated) prior to bedtime, avoid naps during the day and consider diffusing and or bathing in [place] prior to bedtime Mailed printed educational materials related to Chair Exercises Discussed plans with patient for ongoing care management follow up and provided patient with direct contact information for care management team  Patient Goals/Self-Care Activities: Take all medications as prescribed Attend all scheduled provider appointments Call pharmacy for medication refills 3-7 days in advance of running out of medications Perform all self care activities independently  Call provider office for new concerns or questions  check blood pressure daily keep a blood pressure log take blood pressure log to all doctor appointments call doctor for signs and symptoms of high blood pressure take medications for blood pressure exactly as prescribed begin an exercise program report new symptoms to your doctor Try diffusing Lavender essential oil and or showering/bathing with lavender body wash prior to bedtime Try to drink 6-8 glasses of water each day   Follow Up Plan:  Telephone follow up appointment with care management team member  scheduled for:  12/15/21       Barb Merino, RN, BSN, CCM Care Management Coordinator [facility]  Direct Phone: [phone]

## 2021-11-28 NOTE — Therapy (Signed)
OUTPATIENT PHYSICAL THERAPY TREATMENT NOTE   Patient Name: Lawrence Bailey MRN: [id number] DOB:08-07-1940, 81 y.o., male Today's Date: 11/28/2021  PCP: Glendale Chard, MD REFERRING PROVIDER: Glendale Chard, MD  END OF SESSION:    Past Medical History:  Diagnosis Date   BPH (benign prostatic hyperplasia)    Coronary artery disease    GIST (gastrointestinal stromal tumor), malignant ([place]) dx'd 04/2007   gleevac comp 04/2008   Glaucoma    Hypertension    Prostate cancer [facility])    Renal insufficiency    Past Surgical History:  Procedure Laterality Date   ABDOMINAL SURGERY     EYE SURGERY     HERNIA REPAIR     KNEE SURGERY     Patient Active Problem List   Diagnosis Date Noted   Bilateral impacted cerumen 07/27/2021   Paresthesia of both feet 07/27/2021   Fall (on) (from) other stairs and steps, initial encounter 07/27/2021   Recurrent falls 07/27/2021   MGUS (monoclonal gammopathy of unknown significance) 07/27/2021   Chest pain 08/28/2020   Constipation 08/28/2020   Cough 08/28/2020   Dysphagia 08/28/2020   Hemorrhage of rectum and anus 08/28/2020   Atherosclerosis of aorta ([place]) 07/09/2020   Hypertensive nephropathy 05/02/2020   Malignant neoplasm of prostate ([place]) 07/11/2019   Glaucoma 02/28/2018   Syncope and collapse    Syncope 11/08/2016   Stage 3 chronic kidney disease ([place]) 11/08/2016   Essential hypertension 11/08/2016   Hyperglycemia 11/08/2016   Pulmonary contusion 02/04/2012   Thigh hematoma 02/04/2012   H/O malignant gastrointestinal stromal tumor (GIST) 07/20/2011    REFERRING DIAG:  R29.6 (ICD-10-CM) - Recurrent falls  R20.2 (ICD-10-CM) - Paresthesia of both feet    THERAPY DIAG:  No diagnosis found.  Rationale for Evaluation and Treatment Rehabilitation  PERTINENT HISTORY: Hx of stomach cx, prostate cx, hx of stroke about 20 years, renal insufficiency, HTN, CAD  PRECAUTIONS: Fall  SUBJECTIVE: ***  PAIN:  Are you having pain? Yes:  NPRS scale: ***/10 Pain location: knees, hips, shoulders Pain description: achy   OBJECTIVE: (objective measures completed at initial evaluation unless otherwise dated)   DIAGNOSTIC FINDINGS: None related to primary problem   PATIENT SURVEYS:  FOTO 53%, predicted 57% in 11 visits   COGNITION:           Overall cognitive status: Within functional limits for tasks assessed                          SENSATION: Not tested     MUSCLE LENGTH: Hamstrings: moderate tightness BIL Thomas test: moderate tightness BIL   POSTURE: rounded shoulders, decreased lumbar lordosis, increased thoracic kyphosis, and flexed trunk        LOWER EXTREMITY MMT:   MMT Right eval Left eval  Hip flexion 4/5 4/5  Hip extension      Hip abduction      Hip adduction      Knee flexion 4+/5 4+/5  Knee extension 4+/5 4+/5  Ankle dorsiflexion 4+/5 4+/5  Ankle plantarflexion 4+/5 4+/5   (Blank rows = not tested)   BERG BALANCE TEST Sitting to Standing: 3.      Stands independently using hands Standing Unsupported: 4.      Stands safely for 2 minutes Sitting Unsupported: 4.     Sits for 2 minutes independently Standing to Sitting: 3.     Controls descent with hands  Transfers: 3.     Transfers safely definite use of hands  Standing with eyes closed: 3.     Stands 10 seconds with supervision Standing with feet together: 3.     Stands for 1 minute with supervision Reaching forward with outstretched arm: 4.     Reaches forward 10 inches Retrieving object from the floor: 2.     Unable to pick up, but reaches within 1-2 inches independently Turning to look behind: 4.     Looks behind from both sides and weight shifts well Turning 360 degrees: 4.     Able to turn in </=4 seconds  Place alternate foot on stool: 4.     Completes 8 steps in 20 seconds     Standing with one foot in front: 0.     Loses balance while standing/stepping Standing on one foot: 1.     Holds <3 seconds   Total Score: 42/56      FUNCTIONAL TESTS:  5 times sit to stand: 20 seconds with regular posterior LOB Timed up and go (TUG): Assess next visit Squat: 25%, limited by posterior LO and knee pain   GAIT: Distance walked: 20 ft Assistive device utilized: None Level of assistance: Complete Independence Comments: Decreased gait speed and stride length       TODAY'S TREATMENT: [facility] Adult PT Treatment:                                                DATE: 12/02/2021 Therapeutic Exercise: Nustep level 5 x 5 mins Slant board gastroc stretch x2' Modified thomas stretch EOM x1' BIL Seated hamstring stretch 2x30" BIL In // bars: Heel/toe raises Standing marching Standing hip abduction Standing hip extension Squat to chair tap STS  Neuromuscular re-ed (performed in // bars): Romberg stance EO/EC Semi-tandem stance Tandem stance Tandem walking Romberg stance on airex?   11/20/2021: Demonstrated and issued HEP     PATIENT EDUCATION:  Education details: Pt educated on prognosis, POC, FOTO, and HEP Person educated: Patient Education method: Explanation, Demonstration, and Handouts Education comprehension: verbalized understanding and returned demonstration     HOME EXERCISE PROGRAM: Access Code: [email or web address] URL: [email or web address] Date: 11/20/2021 Prepared by: Vanessa Woodside   Exercises - Squat with Chair Touch  - 1 x daily - 7 x weekly - 2 sets - 10 reps - Standing Tandem Balance with Counter Support  - 1 x daily - 7 x weekly - 3 sets - 30-sec hold - Heel Raises with Counter Support  - 1 x daily - 7 x weekly - 3 sets - 10 reps - Sidelying Hip Abduction  - 1 x daily - 7 x weekly - 2 sets - 10 reps - 3-sec hold   ASSESSMENT:   CLINICAL IMPRESSION: ***  Patient is a 81 y.o. M who was seen today for physical therapy evaluation and treatment for chronic balance and endurance deficits.  Upon assessment, his primary impairments include decreased balance with tandem and single leg  stance, poor transfers, weak global BIL LE MMT, tight BIL hip flexors and hamstrings, and limited squatting. He will benefit from skilled PT to address his primary impairments and return to his prior level of function with less limitation.      OBJECTIVE IMPAIRMENTS Abnormal gait, decreased activity tolerance, decreased balance, decreased endurance, decreased mobility, difficulty walking, decreased strength, increased edema, impaired flexibility, impaired sensation, improper body mechanics, and pain.    ACTIVITY LIMITATIONS carrying,  lifting, bending, squatting, and locomotion level   PARTICIPATION LIMITATIONS: shopping, community activity, and yard work   PERSONAL FACTORS Age, Time since onset of injury/illness/exacerbation, and 3+ comorbidities: See medical hx  are also affecting patient's functional outcome.    REHAB POTENTIAL: Fair Due to time since onset of impairments, multiple comorbidities, and age   CLINICAL DECISION MAKING: Stable/uncomplicated   EVALUATION COMPLEXITY: Low     GOALS: Goals reviewed with patient? Yes   SHORT TERM GOALS: Target date: 12/18/2021  Pt will report understanding and adherence to initial HEP in order to promote independence in the management of primary impairments. Baseline: HEP provided at eval Goal status: INITIAL     LONG TERM GOALS: Target date: 01/14/2022    Pt will achieve a FOTO score of 57% in order to demonstrate improved functional ability as it relates to his balance impairments. Baseline: 53% Goal status: INITIAL   2.  Pt will achieve a BERG score of 48/56 or higher in order to demonstrate improved functional balance. Baseline: 42/56 Goal status: INITIAL   3.  Pt will achieve a 5xSTS in 14 seconds or less without LOB in order to demonstrate safe functional transfers. Baseline: 20 seconds with posterior LOB Goal status: INITIAL   4.  Pt will achieve BIL global LE strength of 5/5 in order to return to walking for exercise with less  limitation. Baseline: See MMT chart Goal status: INITIAL       PLAN: PT FREQUENCY: 2x/week   PT DURATION: 8 weeks   PLANNED INTERVENTIONS: Therapeutic exercises, Therapeutic activity, Neuromuscular re-education, Balance training, Gait training, Patient/Family education, Joint mobilization, Stair training, Aquatic Therapy, Dry Needling, Electrical stimulation, Cryotherapy, Taping, Vasopneumatic device, Biofeedback, Manual therapy, and Re-evaluation   PLAN FOR NEXT SESSION: Progress LE strengthening, dynamic and static balance training    Evelene Croon, PTA 11/28/2021, 3:30 PM

## 2021-12-02 ENCOUNTER — Ambulatory Visit: Payer: No Typology Code available for payment source | Attending: Internal Medicine

## 2021-12-02 DIAGNOSIS — M6281 Muscle weakness (generalized): Secondary | ICD-10-CM | POA: Insufficient documentation

## 2021-12-02 DIAGNOSIS — R2681 Unsteadiness on feet: Secondary | ICD-10-CM | POA: Insufficient documentation

## 2021-12-02 DIAGNOSIS — R262 Difficulty in walking, not elsewhere classified: Secondary | ICD-10-CM | POA: Insufficient documentation

## 2021-12-04 ENCOUNTER — Encounter: Payer: Self-pay | Admitting: Physical Therapy

## 2021-12-04 ENCOUNTER — Telehealth (INDEPENDENT_AMBULATORY_CARE_PROVIDER_SITE_OTHER): Payer: No Typology Code available for payment source | Admitting: Nurse Practitioner

## 2021-12-04 ENCOUNTER — Encounter: Payer: Self-pay | Admitting: Nurse Practitioner

## 2021-12-04 ENCOUNTER — Ambulatory Visit: Payer: No Typology Code available for payment source | Admitting: Physical Therapy

## 2021-12-04 VITALS — BP 139/85

## 2021-12-04 DIAGNOSIS — R2681 Unsteadiness on feet: Secondary | ICD-10-CM

## 2021-12-04 DIAGNOSIS — R262 Difficulty in walking, not elsewhere classified: Secondary | ICD-10-CM | POA: Diagnosis not present

## 2021-12-04 DIAGNOSIS — M25561 Pain in right knee: Secondary | ICD-10-CM

## 2021-12-04 DIAGNOSIS — G8929 Other chronic pain: Secondary | ICD-10-CM | POA: Diagnosis not present

## 2021-12-04 DIAGNOSIS — M25562 Pain in left knee: Secondary | ICD-10-CM

## 2021-12-04 DIAGNOSIS — M6281 Muscle weakness (generalized): Secondary | ICD-10-CM

## 2021-12-04 MED ORDER — TRAMADOL HCL 50 MG PO TABS: 50.0000 mg | ORAL_TABLET | Freq: Four times a day (QID) | ORAL | 0 refills | Status: DC | PRN

## 2021-12-04 NOTE — Patient Instructions (Signed)
Acute Knee Pain, Adult Many things can cause knee pain. Sometimes, knee pain is sudden (acute) and may be caused by damage, swelling, or irritation of the muscles and tissues that support your knee. The pain often goes away on its own with time and rest. If the pain does not go away, tests may be done to find out what is causing the pain. Follow these instructions at home: If you have a knee sleeve or brace:  Wear the knee sleeve or brace as told by your doctor. Take it off only as told by your doctor. Loosen it if your toes: Tingle. Become numb. Turn cold and blue. Keep it clean. If the knee sleeve or brace is not waterproof: Do not let it get wet. Cover it with a watertight covering when you take a bath or shower. Activity Rest your knee. Do not do things that cause pain or make pain worse. Avoid activities where both feet leave the ground at the same time (high-impact activities). Examples are running, jumping rope, and doing jumping jacks. Work with a physical therapist to make a safe exercise program, as told by your doctor. Managing pain, stiffness, and swelling  If told, put ice on the knee. To do this: If you have a removable knee sleeve or brace, take it off as told by your doctor. Put ice in a plastic bag. Place a towel between your skin and the bag. Leave the ice on for 20 minutes, 2-3 times a day. Take off the ice if your skin turns bright red. This is very important. If you cannot feel pain, heat, or cold, you have a greater risk of damage to the area. If told, use an elastic bandage to put pressure (compression) on your injured knee. Raise your knee above the level of your heart while you are sitting or lying down. Sleep with a pillow under your knee. General instructions Take over-the-counter and prescription medicines only as told by your doctor. Do not smoke or use any products that contain nicotine or tobacco. If you need help quitting, ask your doctor. If you are  overweight, work with your doctor and a food expert (dietitian) to set goals to lose weight. Being overweight can make your knee hurt more. Watch for any changes in your symptoms. Keep all follow-up visits. Contact a doctor if: The knee pain does not stop. The knee pain changes or gets worse. You have a fever along with knee pain. Your knee is red or feels warm when you touch it. Your knee gives out or locks up. Get help right away if: Your knee swells, and the swelling gets worse. You cannot move your knee. You have very bad knee pain that does not get better with pain medicine. Summary Many things can cause knee pain. The pain often goes away on its own with time and rest. Your doctor may do tests to find out the cause of the pain. Watch for any changes in your symptoms. Relieve your pain with rest, medicines, light activity, and use of ice. Get help right away if you cannot move your knee or your knee pain is very bad. This information is not intended to replace advice given to you by your health care provider. Make sure you discuss any questions you have with your health care provider. Document Revised: 10/25/2019 Document Reviewed: 10/25/2019 Elsevier Patient Education  2023 Else[employer] Inc.  

## 2021-12-04 NOTE — Therapy (Signed)
OUTPATIENT PHYSICAL THERAPY TREATMENT NOTE   Patient Name: Lawrence Bailey MRN: [id number] DOB:May 01, 1941, 81 y.o., male Today's Date: 12/04/2021  PCP: Glendale Chard, MD REFERRING PROVIDER: Glendale Chard, MD  END OF SESSION:   PT End of Session - 12/04/21 1044     Visit Number 3    Number of Visits 17    Date for PT Re-Evaluation 01/22/22    Authorization Type [employer]    PT Start Time 1045    PT Stop Time 1127    PT Time Calculation (min) 42 min    Activity Tolerance Patient tolerated treatment well    Behavior During Therapy [facility] for tasks assessed/performed             Past Medical History:  Diagnosis Date   BPH (benign prostatic hyperplasia)    Coronary artery disease    GIST (gastrointestinal stromal tumor), 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   gleevac comp 04/2008   Glaucoma    Hypertension    Prostate cancer ([employer])    Renal insufficiency    Past Surgical History:  Procedure Laterality Date   ABDOMINAL SURGERY     EYE SURGERY     HERNIA REPAIR     KNEE SURGERY     Patient Active Problem List   Diagnosis Date Noted   Bilateral impacted cerumen 07/27/2021   Paresthesia of both feet 07/27/2021   Fall (on) (from) other stairs and steps, initial encounter 07/27/2021   Recurrent falls 07/27/2021   MGUS (monoclonal gammopathy of unknown significance) 07/27/2021   Chest pain 08/28/2020   Constipation 08/28/2020   Cough 08/28/2020   Dysphagia 08/28/2020   Hemorrhage of rectum and anus 08/28/2020   Atherosclerosis of aorta ([place]) 07/09/2020   Hypertensive nephropathy 05/02/2020   Malignant neoplasm of prostate ([place]) 07/11/2019   Glaucoma 02/28/2018   Syncope and collapse    Syncope 11/08/2016   Stage 3 chronic kidney disease ([place]) 11/08/2016   Essential hypertension 11/08/2016   Hyperglycemia 11/08/2016   Pulmonary contusion 02/04/2012   Thigh hematoma 02/04/2012   H/O malignant gastrointestinal stromal tumor (GIST) 07/20/2011    REFERRING DIAG:   R29.6 (ICD-10-CM) - Recurrent falls  R20.2 (ICD-10-CM) - Paresthesia of both feet    THERAPY DIAG:  Difficulty in walking, not elsewhere classified  Muscle weakness (generalized)  Unsteadiness on feet  Rationale for Evaluation and Treatment Rehabilitation  PERTINENT HISTORY: Hx of stomach cx, prostate cx, hx of stroke about 20 years, renal insufficiency, HTN, CAD  PRECAUTIONS: Fall  SUBJECTIVE: Pt reports last session went well and he is feeling good today.  PAIN:  Are you having pain? Yes: NPRS scale: 4/10 Pain location: knees, hips, shoulders Pain description: achy   OBJECTIVE: (objective measures completed at initial evaluation unless otherwise dated)   DIAGNOSTIC FINDINGS: None related to primary problem   PATIENT SURVEYS:  FOTO 53%, predicted 57% in 11 visits   COGNITION:           Overall cognitive status: Within functional limits for tasks assessed                          SENSATION: Not tested     MUSCLE LENGTH: Hamstrings: moderate tightness BIL Thomas test: moderate tightness BIL   POSTURE: rounded shoulders, decreased lumbar lordosis, increased thoracic kyphosis, and flexed trunk        LOWER EXTREMITY MMT:   MMT Right eval Left eval  Hip flexion 4/5 4/5  Hip extension  Hip abduction      Hip adduction      Knee flexion 4+/5 4+/5  Knee extension 4+/5 4+/5  Ankle dorsiflexion 4+/5 4+/5  Ankle plantarflexion 4+/5 4+/5   (Blank rows = not tested)   BERG BALANCE TEST Sitting to Standing: 3.      Stands independently using hands Standing Unsupported: 4.      Stands safely for 2 minutes Sitting Unsupported: 4.     Sits for 2 minutes independently Standing to Sitting: 3.     Controls descent with hands  Transfers: 3.     Transfers safely definite use of hands Standing with eyes closed: 3.     Stands 10 seconds with supervision Standing with feet together: 3.     Stands for 1 minute with supervision Reaching forward with outstretched  arm: 4.     Reaches forward 10 inches Retrieving object from the floor: 2.     Unable to pick up, but reaches within 1-2 inches independently Turning to look behind: 4.     Looks behind from both sides and weight shifts well Turning 360 degrees: 4.     Able to turn in </=4 seconds  Place alternate foot on stool: 4.     Completes 8 steps in 20 seconds     Standing with one foot in front: 0.     Loses balance while standing/stepping Standing on one foot: 1.     Holds <3 seconds   Total Score: 42/56     FUNCTIONAL TESTS:  5 times sit to stand: 20 seconds with regular posterior LOB Timed up and go (TUG): Assess next visit Squat: 25%, limited by posterior LO and knee pain   GAIT: Distance walked: 20 ft Assistive device utilized: None Level of assistance: Complete Independence Comments: Decreased gait speed and stride length       TODAY'S TREATMENT:  [facility] Adult PT Treatment:                                                DATE: 12/04/2021 Therapeutic Exercise: Nustep level 6 x 5 mins (seat 9) Modified thomas stretch EOM x2' BIL (rec fem) Chair squat - 10x Supine bridge - 2x10  In // bars: Lateral walking with GTB at knees Hurdle walking - unilateral hand hold step to pattern  Neuromuscular re-ed (performed in // bars): Romberg stance EC x45"  Tandem stance x45" BIL Semi tandem (50%) on airex 45"  [facility] Adult PT Treatment:                                                DATE: 12/02/2021 Therapeutic Exercise: Nustep level 5 x 5 mins Slant board gastroc stretch x1' Modified thomas stretch EOM x1' BIL Seated hamstring stretch 2x30" BIL Squat to EOM with airex tap x10 (hands on thighs) STS arms crossed x10 Figure 4 piriformis stretch 2x30" BIL In // bars: Heel/toe raises 2x10 Standing marching 2x10 BIL Standing hip abduction 2x10 BIL Standing hip extension 2x10 BIL Neuromuscular re-ed (performed in // bars): Romberg stance EO/EC x30" each  Tandem stance x30" BIL Tandem  walking x2 laps intermittent UE support Romberg stance on airex 2x30"   11/20/2021: Demonstrated and issued HEP  HOME EXERCISE PROGRAM: Access Code: [email or web address] URL: [email or web address] Date: 11/20/2021 Prepared by: Vanessa [place]   Exercises - Squat with Chair Touch  - 1 x daily - 7 x weekly - 2 sets - 10 reps - Standing Tandem Balance with Counter Support  - 1 x daily - 7 x weekly - 3 sets - 30-sec hold - Heel Raises with Counter Support  - 1 x daily - 7 x weekly - 3 sets - 10 reps - Sidelying Hip Abduction  - 1 x daily - 7 x weekly - 2 sets - 10 reps - 3-sec hold   ASSESSMENT:   CLINICAL IMPRESSION: Bennett tolerated session well with no adverse events.  His illiopsoas does not have significant length deficits but he has significant tightness in RF bil.  Added in hurdle walking to good effect; he requires cues for pacing.     OBJECTIVE IMPAIRMENTS Abnormal gait, decreased activity tolerance, decreased balance, decreased endurance, decreased mobility, difficulty walking, decreased strength, increased edema, impaired flexibility, impaired sensation, improper body mechanics, and pain.    ACTIVITY LIMITATIONS carrying, lifting, bending, squatting, and locomotion level   PARTICIPATION LIMITATIONS: shopping, community activity, and yard work   PERSONAL FACTORS Age, Time since onset of injury/illness/exacerbation, and 3+ comorbidities: See medical hx  are also affecting patient's functional outcome.    REHAB POTENTIAL: Fair Due to time since onset of impairments, multiple comorbidities, and age   CLINICAL DECISION MAKING: Stable/uncomplicated   EVALUATION COMPLEXITY: Low     GOALS: Goals reviewed with patient? Yes   SHORT TERM GOALS: Target date: 12/18/2021  Pt will report understanding and adherence to initial HEP in order to promote independence in the management of primary impairments. Baseline: HEP provided at eval Goal status: INITIAL     LONG  TERM GOALS: Target date: 01/14/2022    Pt will achieve a FOTO score of 57% in order to demonstrate improved functional ability as it relates to his balance impairments. Baseline: 53% Goal status: INITIAL   2.  Pt will achieve a BERG score of 48/56 or higher in order to demonstrate improved functional balance. Baseline: 42/56 Goal status: INITIAL   3.  Pt will achieve a 5xSTS in 14 seconds or less without LOB in order to demonstrate safe functional transfers. Baseline: 20 seconds with posterior LOB Goal status: INITIAL   4.  Pt will achieve BIL global LE strength of 5/5 in order to return to walking for exercise with less limitation. Baseline: See MMT chart Goal status: INITIAL       PLAN: PT FREQUENCY: 2x/week   PT DURATION: 8 weeks   PLANNED INTERVENTIONS: Therapeutic exercises, Therapeutic activity, Neuromuscular re-education, Balance training, Gait training, Patient/Family education, Joint mobilization, Stair training, Aquatic Therapy, Dry Needling, Electrical stimulation, Cryotherapy, Taping, Vasopneumatic device, Biofeedback, Manual therapy, and Re-evaluation   PLAN FOR NEXT SESSION: Progress LE strengthening, dynamic and static balance training    Mathis Dad, PT 12/04/2021, 11:27 AM

## 2021-12-04 NOTE — Progress Notes (Signed)
Virtual Visit via Mychart   This visit type was conducted due to national recommendations for restrictions regarding the COVID-19 Pandemic (e.g. social distancing) in an effort to limit this patient's exposure and mitigate transmission in our community.  Due to his co-morbid illnesses, this patient is at least at moderate risk for complications without adequate follow up.  This format is felt to be most appropriate for this patient at this time.  All issues noted in this document were discussed and addressed.  A limited physical exam was performed with this format.    This visit type was conducted due to national recommendations for restrictions regarding the COVID-19 Pandemic (e.g. social distancing) in an effort to limit this patient's exposure and mitigate transmission in our community.  Patients identity confirmed using two different identifiers.  This format is felt to be most appropriate for this patient at this time.  All issues noted in this document were discussed and addressed.  No physical exam was performed (except for noted visual exam findings with Video Visits).    Date:  12/05/2021   ID:  Lawrence Bailey, DOB Mar 23, 1941, MRN [id number]  Patient Location:  Home - spoke with Lawrence Bailey and   Provider location:   Office    Chief Complaint:  Knee pain  History of Present Illness:    Lawrence Bailey is a 81 y.o. male who presents via video conferencing for a telehealth visit today.    The patient does not have symptoms concerning for COVID-19 infection (fever, chills, cough, or new shortness of breath).   Patient presents today for knee pain. Orthopedic told him he had arthritis, he was given a medication to help with the pain. He is currently in PT. He is not scheduled to see the orthopedic.    Knee Pain  The incident occurred more than 1 week ago. There was no injury mechanism. The quality of the pain is described as aching. He has tried acetaminophen for the symptoms.      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   aspirin 81 MG tablet Take 1 tablet (81 mg total) by mouth daily.   atorvastatin (LIPITOR) 10 MG tablet TAKE 1 TABLET BY MOUTH MONDAY - FRIDAY AT BEDTIME   brimonidine (ALPHAGAN) 0.2 % ophthalmic solution 1 drop 2 (two) times daily.   Cholecalciferol (VITAMIN D3) 125 MCG (5000 UT) CAPS Take 5,000 capsules by mouth daily.   docusate sodium (COLACE) 100 MG capsule Take 100 mg by mouth 2 (two) times daily.   dorzolamide-timolol (COSOPT) 22.3-6.8 MG/ML ophthalmic solution Place 1 drop into both eyes 2 (two) times daily.   famotidine (PEPCID) 20 MG tablet Take 1 tablet (20 mg total) by mouth daily.   fluticasone (FLONASE) 50 MCG/ACT nasal spray INHALE 2 SPRAYS INTO EACH NOSTRIL EVERY NIGHT   latanoprost (XALATAN) 0.005 % ophthalmic solution Place 1 drop into both eyes at bedtime.  pantoprazole (PROTONIX) 40 MG tablet TAKE 1 TABLET BY MOUTH EVERY DAY   sildenafil (REVATIO) 20 MG tablet Take 20 mg by mouth 3 (three) times daily.   tamsulosin (FLOMAX) 0.4 MG CAPS capsule Take 0.4 mg by mouth.   traMADol (ULTRAM) 50 MG tablet Take 1 tablet (50 mg total) by mouth every 6 (six) hours as needed.   valsartan-hydrochlorothiazide (DIOVAN-HCT) 320-25 MG tablet Take 1 tablet by mouth daily.     Allergies:   Other and Sucralfate   Social History   Tobacco Use   Smoking status: Never   Smokeless tobacco: Never  Vaping Use   Vaping Use: Never used  Substance Use Topics   Alcohol use: Not  Currently   Drug use: No     Family Hx: The patient's family history includes Cancer in his mother; Hypertension in his mother. There is no history of Breast cancer, Prostate cancer, Colon cancer, or Pancreatic cancer.  ROS:   Please see the history of present illness.    Review of Systems  Constitutional: Negative.   Respiratory: Negative.    Cardiovascular: Negative.   Gastrointestinal: Negative.   Musculoskeletal:  Positive for joint pain.  Neurological: Negative.   Psychiatric/Behavioral: Negative.      All other systems reviewed and are negative.   Labs/Other Tests and Data Reviewed:    Recent Labs: 07/24/2021: TSH 1.640 11/20/2021: ALT 13; BUN 19; Creatinine, Ser 1.19; Hemoglobin 11.1; Platelets 154; Potassium 4.0; Sodium 137   Recent Lipid Panel Lab Results  Component Value Date/Time   CHOL 154 05/02/2020 05:32 PM   TRIG 65 05/02/2020 05:32 PM   HDL 57 05/02/2020 05:32 PM   CHOLHDL 2.7 05/02/2020 05:32 PM   LDLCALC 84 05/02/2020 05:32 PM    Wt Readings from Last 3 Encounters:  11/20/21 188 lb (85.3 kg)  11/20/21 188 lb (85.3 kg)  07/24/21 182 lb 3.2 oz (82.6 kg)     Exam:    Vital Signs:  BP 139/85 (BP Location: Left Arm, Patient Position: Sitting, Cuff Size: Normal)     Physical Exam Vitals reviewed.  Constitutional:      General: He is not in acute distress.    Appearance: Normal appearance.  Pulmonary:     Effort: Pulmonary effort is normal. No respiratory distress.  Neurological:     General: No focal deficit present.     Mental Status: He is alert and oriented to person, place, and time. Mental status is at baseline.     Cranial Nerves: No cranial nerve deficit.  Psychiatric:        Mood and Affect: Mood and affect normal.        Behavior: Behavior normal.        Thought Content: Thought content normal.        Cognition and Memory: Memory normal.        Judgment: Judgment normal.     ASSESSMENT & PLAN:     1. Chronic pain of both  knees He has seen Orthopedics and continues to have knee pain. I will only provide a limited supply of Tramadol. Encouraged to take Tylenol at least once a day to help manage pain better.  PDMP reviewed during this encounter.     COVID-19 Education: The signs and symptoms of COVID-19 were discussed with the patient and how to seek care for testing (follow up with PCP or arrange E-visit).  The importance of social distancing was discussed today.  Patient Risk:   After full review of  this patients clinical status, I feel that they are at least moderate risk at this time.  Time:   Today, I have spent 7 minutes/ seconds with the patient with telehealth technology discussing above diagnoses.     Medication Adjustments/Labs and Tests Ordered: Current medicines are reviewed at length with the patient today.  Concerns regarding medicines are outlined above.   Tests Ordered: No orders of the defined types were placed in this encounter.   Medication Changes: Meds ordered this encounter  Medications   traMADol (ULTRAM) 50 MG tablet    Sig: Take 1 tablet (50 mg total) by mouth every 6 (six) hours as needed.    Dispense:  20 tablet    Refill:  0    Disposition:  Follow up prn  Signed, Minette Brine, FNP

## 2021-12-11 ENCOUNTER — Ambulatory Visit: Payer: No Typology Code available for payment source | Admitting: Physical Therapy

## 2021-12-11 ENCOUNTER — Encounter: Payer: Self-pay | Admitting: Physical Therapy

## 2021-12-11 DIAGNOSIS — R262 Difficulty in walking, not elsewhere classified: Secondary | ICD-10-CM

## 2021-12-11 DIAGNOSIS — M6281 Muscle weakness (generalized): Secondary | ICD-10-CM

## 2021-12-11 DIAGNOSIS — R2681 Unsteadiness on feet: Secondary | ICD-10-CM

## 2021-12-11 NOTE — Progress Notes (Signed)
Cardiology Office Note:    Date:  12/12/2021   ID:  NYCHOLAS RAYNER, DOB May 05, 1941, MRN [id number]  PCP:  Glendale Chard, MD  Cardiologist:  None   Referring MD: Glendale Chard, MD   Chief Complaint  Patient presents with   Loss of Consciousness   Hypertension    History of Present Illness:    Lawrence Bailey is a 81 y.o. male with a hx of aortic atherosclerosis,CAD,  CKD 3, primary hypertension, and orthostasis with dizziness.  "The following message and recommendations were the genesis for this office visit:  Spoke with patient who reports he "blacked out" after getting up and fell about 2 weeks ago, and about 5 days after this started having chest pain and palpitations. Patient denies SOB, N/V, or sweating. Patient reports pain present only when he "lays in certain positions."    Patient states he has not had any CP/palpitations for the last 3 days. He states he stopped his mucinex and valsartan-HCTZ about 2 days ago. He reports his dizziness is "a little better" since being off of valsartan-HCTZ. He reports his BP ranges 99/70 to 140/80 while being off of valsartan-HCTZ.   Discussed the above with Dr. Tamala Julian who recommends patient wear compression stockings, may stay off of valsartan-HCTZ, continue to monitor BP and call us if reading is 170/90 or greater. Follow-up OV scheduled for 12/12/21 at 1:40PM."  The episode noted above occurred when he arose in the middle of the night to urinate.  It happened after he completed urination.  He felt very dizzy.  He tried to hold on but lost consciousness.  He did not hit his head.  No recurrence since that time.  He has been shown to have orthostasis at times documented at home and in doctors offices.  As noted above the patient states his blood pressures can run between 188 mmHg systolic and 416 mmHg.  Today he took amlodipine and valsartan HCT this a.m. which is about 6 hours ago.  Past Medical History:  Diagnosis Date   BPH (benign  prostatic hyperplasia)    Coronary artery disease    GIST (gastrointestinal stromal tumor), malignant ([place]) dx'd 04/2007   gleevac comp 04/2008   Glaucoma    Hypertension    Prostate cancer ([place])    Renal insufficiency     Past Surgical History:  Procedure Laterality Date   ABDOMINAL SURGERY     EYE SURGERY     HERNIA REPAIR     KNEE SURGERY      Current Medications: Current Meds  Medication Sig   amLODipine (NORVASC) 10 MG tablet Take 10 mg by mouth daily.   aspirin 81 MG tablet Take 1 tablet (81 mg total) by mouth daily.   atorvastatin (LIPITOR) 10 MG tablet TAKE 1 TABLET BY MOUTH MONDAY - FRIDAY AT BEDTIME   brimonidine (ALPHAGAN) 0.2 % ophthalmic solution 1 drop 2 (two) times daily.   Cholecalciferol (VITAMIN D3) 125 MCG (5000 UT) CAPS Take 5,000 capsules by mouth daily.   docusate sodium (COLACE) 100 MG capsule Take 100 mg by mouth 2 (two) times daily.   dorzolamide-timolol (COSOPT) 22.3-6.8 MG/ML ophthalmic solution Place 1 drop into both eyes 2 (two) times daily.   famotidine (PEPCID) 20 MG tablet TAKE 1 TABLET BY MOUTH EVERY DAY   finasteride (PROSCAR) 5 MG tablet Take 5 mg by mouth daily.   fluticasone (FLONASE) 50 MCG/ACT nasal spray INHALE 2 SPRAYS INTO EACH NOSTRIL EVERY NIGHT   latanoprost (XALATAN) 0.005 %  ophthalmic solution Place 1 drop into both eyes at bedtime.   pantoprazole (PROTONIX) 40 MG tablet TAKE 1 TABLET BY MOUTH EVERY DAY   sildenafil (REVATIO) 20 MG tablet Take 20 mg by mouth 3 (three) times daily.   tamsulosin (FLOMAX) 0.4 MG CAPS capsule Take 0.4 mg by mouth.   traMADol (ULTRAM) 50 MG tablet Take 1 tablet (50 mg total) by mouth every 6 (six) hours as needed.   valsartan-hydrochlorothiazide (DIOVAN-HCT) 320-25 MG tablet Take 1 tablet by mouth daily.     Allergies:   Other and Sucralfate   Social History   Socioeconomic History   Marital status: Married    Spouse name: Not on file   Number of children: Not on file   Years of education: Not  on file   Highest education level: Not on file  Occupational History   Occupation: retired  Tobacco Use   Smoking status: Never   Smokeless tobacco: Never  Vaping Use   Vaping Use: Never used  Substance and Sexual Activity   Alcohol use: Not Currently   Drug use: No   Sexual activity: Yes  Other Topics Concern   Not on file  Social History Narrative   Not on file   Social Determinants of Health   Financial Resource Strain: Low Risk  (11/20/2021)   Overall Financial Resource Strain (CARDIA)    Difficulty of Paying Living Expenses: Not hard at all  Food Insecurity: No Food Insecurity (11/20/2021)   Hunger Vital Sign    Worried About Running Out of Food in the Last Year: Never true    [place] in the Last Year: Never true  Transportation Needs: No Transportation Needs (11/20/2021)   PRAPARE - Hydrologist (Medical): No    Lack of Transportation (Non-Medical): No  Physical Activity: Inactive (11/20/2021)   Exercise Vital Sign    Days of Exercise per Week: 0 days    Minutes of Exercise per Session: 0 min  Stress: No Stress Concern Present (11/20/2021)   [place]    Feeling of Stress : Not at all  Social Connections: Not on file     Family History: The patient's family history includes Cancer in his mother; Hypertension in his mother. There is no history of Breast cancer, Prostate cancer, Colon cancer, or Pancreatic cancer.  ROS:   Please see the history of present illness.    Intermittent orthostatic dizziness all other systems reviewed and are negative.  EKGs/Labs/Other Studies Reviewed:    The following studies were reviewed today: No new data  EKG:  EKG normal sinus rhythm, first-degree AV block, left ventricular hypertrophy with strain, and when compared with the most recent from 11/28/2020 with the exception that there is now mild first-degree AV block measuring 214  ms.  Recent Labs: 07/24/2021: TSH 1.640 11/20/2021: ALT 13; BUN 19; Creatinine, Ser 1.19; Hemoglobin 11.1; Platelets 154; Potassium 4.0; Sodium 137  Recent Lipid Panel    Component Value Date/Time   CHOL 154 05/02/2020 1732   TRIG 65 05/02/2020 1732   HDL 57 05/02/2020 1732   CHOLHDL 2.7 05/02/2020 1732   LDLCALC 84 05/02/2020 1732    Physical Exam:    VS:  BP (!) 142/84   Pulse (!) 59   Ht 5' 7.8" (1.722 m)   Wt 184 lb 3.2 oz (83.6 kg)   SpO2 97%   BMI 28.17 kg/m     Wt Readings  from Last 3 Encounters:  12/12/21 184 lb 3.2 oz (83.6 kg)  11/20/21 188 lb (85.3 kg)  11/20/21 188 lb (85.3 kg)    Blood pressure standing 140/82 mmHg.  Heart rate 68.  Blood pressure sitting 145/80 mmHg heart rate 59. GEN: Healthy appearing. No acute distress HEENT: Normal NECK: No JVD. LYMPHATICS: No lymphadenopathy CARDIAC: No murmur. RRR S4 gallop, or edema. VASCULAR:  Normal Pulses. No bruits. RESPIRATORY:  Clear to auscultation without rales, wheezing or rhonchi  ABDOMEN: Soft, non-tender, non-distended, No pulsatile mass, MUSCULOSKELETAL: No deformity  SKIN: Warm and dry NEUROLOGIC:  Alert and oriented x 3 PSYCHIATRIC:  Normal affect   ASSESSMENT:    1. Essential hypertension   2. Atherosclerosis of aorta (HCC)   3. Stage 3a chronic kidney disease ([place])   4. Orthostasis   5. Malignant neoplasm of prostate (HCC)    PLAN:    In order of problems listed above:  Syncope was likely micturition related in a patient who already has orthostasis related to antihypertensive therapy (Diovan HCT) and tamsulosin.  Recommended compression stockings.  Recommended that upon standing from supine or sitting position, take time before he walks off to make sure that he is not developing orthostasis.  Encouraged fluid intake.  Compression stockings are now being worn.  He should mention the episode of syncope to his urologist to see if perhaps discontinuation of tamsulosin is possible. Did not  discuss.  Continue statin therapy. Did not evaluate Not demonstrated today.  This in conjunction with micturition and his blood pressure medication regimen along with tamsulosin are responsible.   Clinical follow-up as needed.  He does have first-degree AV block on his EKG now.  If he has episodes of dizziness or lightheadedness not associated with postural changes, would consider long-term monitor to rule out high-grade AV block/bradycardia.   Medication Adjustments/Labs and Tests Ordered: Current medicines are reviewed at length with the patient today.  Concerns regarding medicines are outlined above.  Orders Placed This Encounter  Procedures   EKG 12-Lead   No orders of the defined types were placed in this encounter.   Patient Instructions  Medication Instructions:  Your physician recommends that you continue on your current medications as directed. Please refer to the Current Medication list given to you today.  *If you need a refill on your cardiac medications before your next appointment, please call your pharmacy*  Lab Work: NONE  Testing/Procedures: NONE  Follow-Up: At [facility], you and your health needs are our priority.  As part of our continuing mission to provide you with exceptional heart care, we have created designated Provider Care Teams.  These Care Teams include your primary Cardiologist (physician) and Advanced Practice Providers (APPs -  Physician Assistants and Nurse Practitioners) who all work together to provide you with the care you need, when you need it.  Your next appointment:   As needed  The format for your next appointment:   In Person  Provider:   Daneen Schick, MD   Important Information About Sugar         Signed, Lawrence Grooms, MD  12/12/2021 2:19 PM    [place]

## 2021-12-11 NOTE — Therapy (Signed)
OUTPATIENT PHYSICAL THERAPY TREATMENT NOTE   Patient Name: Lawrence Bailey MRN: [id number] DOB:[birth year], 81 y.o., male Today's Date: 12/11/2021  PCP: Glendale Chard, MD REFERRING PROVIDER: Glendale Chard, MD  END OF SESSION:   PT End of Session - 12/11/21 0916     Visit Number 4    Number of Visits 17    Date for PT Re-Evaluation 01/22/22    Authorization Type [employer]    PT Start Time 0915    PT Stop Time 0957    PT Time Calculation (min) 42 min    Activity Tolerance Patient tolerated treatment well    Behavior During Therapy [facility] for tasks assessed/performed             Past Medical History:  Diagnosis Date   BPH (benign prostatic hyperplasia)    Coronary artery disease    GIST (gastrointestinal stromal tumor), 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   gleevac comp 04/2008   Glaucoma    Hypertension    Prostate cancer ([place])    Renal insufficiency    Past Surgical History:  Procedure Laterality Date   ABDOMINAL SURGERY     EYE SURGERY     HERNIA REPAIR     KNEE SURGERY     Patient Active Problem List   Diagnosis Date Noted   Bilateral impacted cerumen 07/27/2021   Paresthesia of both feet 07/27/2021   Fall (on) (from) other stairs and steps, initial encounter 07/27/2021   Recurrent falls 07/27/2021   MGUS (monoclonal gammopathy of unknown significance) 07/27/2021   Chest pain 08/28/2020   Constipation 08/28/2020   Cough 08/28/2020   Dysphagia 08/28/2020   Hemorrhage of rectum and anus 08/28/2020   Atherosclerosis of aorta ([place]) 07/09/2020   Hypertensive nephropathy 05/02/2020   Malignant neoplasm of prostate ([place]) 07/11/2019   Glaucoma 02/28/2018   Syncope and collapse    Syncope 11/08/2016   Stage 3 chronic kidney disease ([place]) 11/08/2016   Essential hypertension 11/08/2016   Hyperglycemia 11/08/2016   Pulmonary contusion 02/04/2012   Thigh hematoma 02/04/2012   H/O malignant gastrointestinal stromal tumor (GIST) 07/20/2011    REFERRING DIAG:   R29.6 (ICD-10-CM) - Recurrent falls  R20.2 (ICD-10-CM) - Paresthesia of both feet    THERAPY DIAG:  Difficulty in walking, not elsewhere classified  Muscle weakness (generalized)  Unsteadiness on feet  Rationale for Evaluation and Treatment Rehabilitation  PERTINENT HISTORY: Hx of stomach cx, prostate cx, hx of stroke about 20 years, renal insufficiency, HTN, CAD  PRECAUTIONS: Fall  SUBJECTIVE: Pt reports that overall he feels good.  He feels that PT has been helpful.  PAIN:  Are you having pain? Yes: NPRS scale: 4/10 Pain location: knees, hips, shoulders Pain description: achy   OBJECTIVE: (objective measures completed at initial evaluation unless otherwise dated)   DIAGNOSTIC FINDINGS: None related to primary problem   PATIENT SURVEYS:  FOTO 53%, predicted 57% in 11 visits   COGNITION:           Overall cognitive status: Within functional limits for tasks assessed                          SENSATION: Not tested     MUSCLE LENGTH: Hamstrings: moderate tightness BIL Thomas test: moderate tightness BIL   POSTURE: rounded shoulders, decreased lumbar lordosis, increased thoracic kyphosis, and flexed trunk        LOWER EXTREMITY MMT:   MMT Right eval Left eval  Hip flexion 4/5 4/5  Hip  extension      Hip abduction      Hip adduction      Knee flexion 4+/5 4+/5  Knee extension 4+/5 4+/5  Ankle dorsiflexion 4+/5 4+/5  Ankle plantarflexion 4+/5 4+/5   (Blank rows = not tested)   BERG BALANCE TEST Sitting to Standing: 3.      Stands independently using hands Standing Unsupported: 4.      Stands safely for 2 minutes Sitting Unsupported: 4.     Sits for 2 minutes independently Standing to Sitting: 3.     Controls descent with hands  Transfers: 3.     Transfers safely definite use of hands Standing with eyes closed: 3.     Stands 10 seconds with supervision Standing with feet together: 3.     Stands for 1 minute with supervision Reaching forward with  outstretched arm: 4.     Reaches forward 10 inches Retrieving object from the floor: 2.     Unable to pick up, but reaches within 1-2 inches independently Turning to look behind: 4.     Looks behind from both sides and weight shifts well Turning 360 degrees: 4.     Able to turn in </=4 seconds  Place alternate foot on stool: 4.     Completes 8 steps in 20 seconds     Standing with one foot in front: 0.     Loses balance while standing/stepping Standing on one foot: 1.     Holds <3 seconds   Total Score: 42/56     FUNCTIONAL TESTS:  5 times sit to stand: 20 seconds with regular posterior LOB Timed up and go (TUG): Assess next visit Squat: 25%, limited by posterior LO and knee pain   GAIT: Distance walked: 20 ft Assistive device utilized: None Level of assistance: Complete Independence Comments: Decreased gait speed and stride length       TODAY'S TREATMENT:   [facility] Adult PT Treatment:                                                DATE: 12/11/2021 Therapeutic Exercise: Nustep level 6 x 5 mins (seat 9) Modified thomas stretch EOM x2' BIL (rec fem) Chair squat - 2x10 Supine bridge staggered - 15x ea Step up - 4'' 15x ea no UE support in // CGA  In // bars: Lateral walking with GTB at knees Hurdle walking no UE support  Neuromuscular re-ed (performed in // bars): Semi tandem (75%) on airex 45"  [facility] Adult PT Treatment:                                                DATE: 12/04/2021 Therapeutic Exercise: Nustep level 6 x 5 mins (seat 9) Modified thomas stretch EOM x2' BIL (rec fem) Chair squat - 10x Supine bridge - 2x10  In // bars: Lateral walking with GTB at knees Hurdle walking - unilateral hand hold step to pattern  Neuromuscular re-ed (performed in // bars): Romberg stance EC x45"  Tandem stance x45" BIL Semi tandem (50%) on airex 45"  [facility] Adult PT Treatment:  DATE: 12/02/2021 Therapeutic Exercise: Nustep level 5 x  5 mins Slant board gastroc stretch x1' Modified thomas stretch EOM x1' BIL Seated hamstring stretch 2x30" BIL Squat to EOM with airex tap x10 (hands on thighs) STS arms crossed x10 Figure 4 piriformis stretch 2x30" BIL In // bars: Heel/toe raises 2x10 Standing marching 2x10 BIL Standing hip abduction 2x10 BIL Standing hip extension 2x10 BIL Neuromuscular re-ed (performed in // bars): Romberg stance EO/EC x30" each  Tandem stance x30" BIL Tandem walking x2 laps intermittent UE support Romberg stance on airex 2x30"   11/20/2021: Demonstrated and issued HEP        HOME EXERCISE PROGRAM: Access Code: [email or web address] URL: [email or web address] Date: 11/20/2021 Prepared by: Vanessa [place]   Exercises - Squat with Chair Touch  - 1 x daily - 7 x weekly - 2 sets - 10 reps - Standing Tandem Balance with Counter Support  - 1 x daily - 7 x weekly - 3 sets - 30-sec hold - Heel Raises with Counter Support  - 1 x daily - 7 x weekly - 3 sets - 10 reps - Sidelying Hip Abduction  - 1 x daily - 7 x weekly - 2 sets - 10 reps - 3-sec hold   ASSESSMENT:   CLINICAL IMPRESSION: [place] tolerated session well with no adverse reaction.  We were able to progress to staggered bridge with no issue today.  Able to complete step up and hurdle walking with no UE support showing improvement in balance.  He does require cuing to reduce pacing and maintain form throughout.    OBJECTIVE IMPAIRMENTS Abnormal gait, decreased activity tolerance, decreased balance, decreased endurance, decreased mobility, difficulty walking, decreased strength, increased edema, impaired flexibility, impaired sensation, improper body mechanics, and pain.    ACTIVITY LIMITATIONS carrying, lifting, bending, squatting, and locomotion level   PARTICIPATION LIMITATIONS: shopping, community activity, and yard work   PERSONAL FACTORS Age, Time since onset of injury/illness/exacerbation, and 3+ comorbidities: See medical hx   are also affecting patient's functional outcome.    REHAB POTENTIAL: Fair Due to time since onset of impairments, multiple comorbidities, and age   CLINICAL DECISION MAKING: Stable/uncomplicated   EVALUATION COMPLEXITY: Low     GOALS: Goals reviewed with patient? Yes   SHORT TERM GOALS: Target date: 12/18/2021  Pt will report understanding and adherence to initial HEP in order to promote independence in the management of primary impairments. Baseline: HEP provided at eval Goal status: MET 7/20     LONG TERM GOALS: Target date: 01/14/2022    Pt will achieve a FOTO score of 57% in order to demonstrate improved functional ability as it relates to his balance impairments. Baseline: 53% Goal status: INITIAL   2.  Pt will achieve a BERG score of 48/56 or higher in order to demonstrate improved functional balance. Baseline: 42/56 Goal status: INITIAL   3.  Pt will achieve a 5xSTS in 14 seconds or less without LOB in order to demonstrate safe functional transfers. Baseline: 20 seconds with posterior LOB Goal status: INITIAL   4.  Pt will achieve BIL global LE strength of 5/5 in order to return to walking for exercise with less limitation. Baseline: See MMT chart Goal status: INITIAL       PLAN: PT FREQUENCY: 2x/week   PT DURATION: 8 weeks   PLANNED INTERVENTIONS: Therapeutic exercises, Therapeutic activity, Neuromuscular re-education, Balance training, Gait training, Patient/Family education, Joint mobilization, Stair training, Aquatic Therapy, Dry Needling, Electrical stimulation, Cryotherapy, Taping, Vasopneumatic device, Biofeedback, Manual therapy, and  Re-evaluation   PLAN FOR NEXT SESSION: Progress LE strengthening, dynamic and static balance training    Mathis Dad, PT 12/11/2021, 10:01 AM

## 2021-12-12 ENCOUNTER — Encounter: Payer: Self-pay | Admitting: Interventional Cardiology

## 2021-12-12 ENCOUNTER — Other Ambulatory Visit: Payer: Self-pay | Admitting: Internal Medicine

## 2021-12-12 ENCOUNTER — Ambulatory Visit: Payer: No Typology Code available for payment source | Admitting: Interventional Cardiology

## 2021-12-12 VITALS — BP 142/84 | HR 59 | Ht 67.8 in | Wt 184.2 lb

## 2021-12-12 DIAGNOSIS — N1831 Chronic kidney disease, stage 3a: Secondary | ICD-10-CM

## 2021-12-12 DIAGNOSIS — I10 Essential (primary) hypertension: Secondary | ICD-10-CM | POA: Diagnosis not present

## 2021-12-12 DIAGNOSIS — I700 Atherosclerosis of aorta: Secondary | ICD-10-CM | POA: Diagnosis not present

## 2021-12-12 DIAGNOSIS — C61 Malignant neoplasm of prostate: Secondary | ICD-10-CM

## 2021-12-12 DIAGNOSIS — R296 Repeated falls: Secondary | ICD-10-CM | POA: Insufficient documentation

## 2021-12-12 DIAGNOSIS — I951 Orthostatic hypotension: Secondary | ICD-10-CM

## 2021-12-12 NOTE — Patient Instructions (Signed)
Medication Instructions:  Your physician recommends that you continue on your current medications as directed. Please refer to the Current Medication list given to you today.  *If you need a refill on your cardiac medications before your next appointment, please call your pharmacy*  Lab Work: NONE  Testing/Procedures: NONE  Follow-Up: At [facility], you and your health needs are our priority.  As part of our continuing mission to provide you with exceptional heart care, we have created designated Provider Care Teams.  These Care Teams include your primary Cardiologist (physician) and Advanced Practice Providers (APPs -  Physician Assistants and Nurse Practitioners) who all work together to provide you with the care you need, when you need it.  Your next appointment:   As needed  The format for your next appointment:   In Person  Provider:   Daneen Schick, MD   Important Information About Sugar

## 2021-12-15 ENCOUNTER — Ambulatory Visit: Payer: Self-pay

## 2021-12-15 NOTE — Chronic Care Management (AMB) (Deleted)
Care Management    RN Visit Note  12/15/2021 Name: Lawrence Bailey MRN: [id number] DOB: [birth year]  Subjective: Lawrence Bailey is a 81 y.o. year old male who is a primary care patient of Glendale Chard, MD. The care management team was consulted for assistance with disease management and care coordination needs.    {CCMTELEPHONEFACETOFACE:21091510} for {CCMINITIALFOLLOWUPCHOICE:21091511} in response to provider referral for case management and/or care coordination services.   Consent to Services:   Mr. Kinzler was given information about Care Management services today including:  Care Management services includes personalized support from designated clinical staff supervised by his physician, including individualized plan of care and coordination with other care providers 24/7 contact phone numbers for assistance for urgent and routine care needs. The patient may stop case management services at any time by phone call to the office staff.  Patient agreed to services and consent obtained.   Assessment: Review of patient past medical history, allergies, medications, health status, including review of consultants reports, laboratory and other test data, was performed as part of comprehensive evaluation and provision of chronic care management services.   SDOH (Social Determinants of Health) assessments and interventions performed:    Care Plan  Allergies  Allergen Reactions   Other     Bee sting    Sucralfate Other (See Comments)    Outpatient Encounter Medications as of 12/15/2021  Medication Sig   amLODipine (NORVASC) 10 MG tablet Take 10 mg by mouth daily.   aspirin 81 MG tablet Take 1 tablet (81 mg total) by mouth daily.   atorvastatin (LIPITOR) 10 MG tablet TAKE 1 TABLET BY MOUTH MONDAY - FRIDAY AT BEDTIME   brimonidine (ALPHAGAN) 0.2 % ophthalmic solution 1 drop 2 (two) times daily.   Cholecalciferol (VITAMIN D3) 125 MCG (5000 UT) CAPS Take 5,000 capsules by mouth daily.    docusate sodium (COLACE) 100 MG capsule Take 100 mg by mouth 2 (two) times daily.   dorzolamide-timolol (COSOPT) 22.3-6.8 MG/ML ophthalmic solution Place 1 drop into both eyes 2 (two) times daily.   famotidine (PEPCID) 20 MG tablet TAKE 1 TABLET BY MOUTH EVERY DAY   finasteride (PROSCAR) 5 MG tablet Take 5 mg by mouth daily.   fluticasone (FLONASE) 50 MCG/ACT nasal spray INHALE 2 SPRAYS INTO EACH NOSTRIL EVERY NIGHT   latanoprost (XALATAN) 0.005 % ophthalmic solution Place 1 drop into both eyes at bedtime.   sildenafil (REVATIO) 20 MG tablet Take 20 mg by mouth 3 (three) times daily.   tamsulosin (FLOMAX) 0.4 MG CAPS capsule Take 0.4 mg by mouth.   traMADol (ULTRAM) 50 MG tablet Take 1 tablet (50 mg total) by mouth every 6 (six) hours as needed.   valsartan-hydrochlorothiazide (DIOVAN-HCT) 320-25 MG tablet Take 1 tablet by mouth daily.   pantoprazole (PROTONIX) 40 MG tablet TAKE 1 TABLET BY MOUTH EVERY DAY (Patient not taking: Reported on 12/15/2021)   No facility-administered encounter medications on file as of 12/15/2021.    Patient Active Problem List   Diagnosis Date Noted   Bilateral impacted cerumen 07/27/2021   Paresthesia of both feet 07/27/2021   Fall (on) (from) other stairs and steps, initial encounter 07/27/2021   Recurrent falls 07/27/2021   MGUS (monoclonal gammopathy of unknown significance) 07/27/2021   Chest pain 08/28/2020   Constipation 08/28/2020   Cough 08/28/2020   Dysphagia 08/28/2020   Hemorrhage of rectum and anus 08/28/2020   Atherosclerosis of aorta ([place]) 07/09/2020   Hypertensive nephropathy 05/02/2020   Malignant neoplasm of prostate ([place])  07/11/2019   Glaucoma 02/28/2018   Syncope and collapse    Syncope 11/08/2016   Stage 3 chronic kidney disease ([place]) 11/08/2016   Essential hypertension 11/08/2016   Hyperglycemia 11/08/2016   Pulmonary contusion 02/04/2012   Thigh hematoma 02/04/2012   H/O malignant gastrointestinal stromal tumor ([facility]) 07/20/2011     Conditions to be addressed/monitored: {CCM ASSESSMENT DZ OPTIONS:25047}  Care Plan : RN Care Manager Plan of Care  Updates made by Lynne Logan, RN since 12/15/2021 12:00 AM     Problem: No plan of care established for management of chronic disease states (Hypertensive nephropathy, Atherosclerosis of aorta,  Stage 3a chronic kidney disease, Monoclonal gammopathy of unknown significance)   Priority: High     Long-Range Goal: Development of plan of care for chronic disease management for (Hypertensive nephropathy, Atherosclerosis of aorta,  Stage 3a chronic kidney disease, Monoclonal gammopathy of unknown significance)   Start Date: 05/05/2021  Expected End Date: 05/05/2022  Recent Progress: On track  Priority: High  Note:   Current Barriers:  Knowledge Deficits related to plan of care for management of Hypertensive nephropathy, Atherosclerosis of aorta,  Stage 3a chronic kidney disease, Monoclonal gammopathy of unknown significance  Chronic Disease Management support and education needs related to Hypertensive nephropathy, Atherosclerosis of aorta,  Stage 3a chronic kidney disease, Monoclonal gammopathy of unknown significance   RNCM Clinical Goal(s):  Patient will verbalize basic understanding of  Hypertensive nephropathy, Atherosclerosis of aorta,  Stage 3a chronic kidney disease, Monoclonal gammopathy of unknown significance disease process and self health management plan as evidenced by patient will report having no disease exacerbations related to his chronic disease states listed above take all medications exactly as prescribed and will call provider for medication related questions as evidenced by patient will report having no missed doses of his prescribed medications  demonstrate Ongoing health management independence as evidenced by patient will report 100% adherence to his prescribed treatment plan  continue to work with RN Care Manager to address care management and care  coordination needs related to  Hypertensive nephropathy, Atherosclerosis of aorta,  Stage 3a chronic kidney disease, Monoclonal gammopathy of unknown significance as evidenced by adherence to CM Team Scheduled appointments demonstrate ongoing self health care management ability   as evidenced by    through collaboration with RN Care manager, provider, and care team.   Interventions: 1:1 collaboration with primary care provider regarding development and update of comprehensive plan of care as evidenced by provider attestation and co-signature Inter-disciplinary care team collaboration (see longitudinal plan of care) Evaluation of current treatment plan related to  self management and patient's adherence to plan as established by provider   Chronic Kidney Disease Interventions:  (Status:  Goal Met.) Long Term Goal Assessed the Patient understanding of chronic kidney disease    Evaluation of current treatment plan related to chronic kidney disease self management and patient's adherence to plan as established by provider      Reviewed prescribed diet continue to increase daily water intake unless otherwise directed by your doctor  Assessed social determinant of health barriers    Provided education on kidney disease progression    Last practice recorded BP readings:  BP Readings from Last 3 Encounters:  12/12/21 (!) 142/84  12/04/21 139/85  11/20/21 136/80  Most recent eGFR/CrCl:  Lab Results  Component Value Date   EGFR 61 11/20/2021    No components found for: "CRCL"  Monoclonal gammopathy of unknown significance (MGUS):  (Status:  Condition stable.  Not  addressed this visit.)  Long Term Goal Evaluation of current treatment plan related to  Monoclonal gammopathy of unknown significance (MGUS) , self-management and patient's adherence to plan as established by provider. Evaluation of current treatment plan related to MGUS (Monoclonal gammopathy of unknown significance), self-management and  patient's adherence to plan as established by provider. Collaboration with Glendale Chard, MD regarding development and update of comprehensive plan of care as evidenced by provider attestation       and co-signature Inter-disciplinary care team collaboration (see longitudinal plan of care) Review of patient status, including review of consultants reports, relevant laboratory and other test results, and medications completed. Determined patient completed follow up visit with Dr. 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 Pathology showed a 6 cm GI stromal tumor. No vascular or lymphatic invasion. All surgical margins negative. Lymph nodes not sampled. Tumor confined to the submucosa. Mitotic activity 25-50 mitoses per high-power field. He completed 1 year of Gleevec 400 mg daily. Restaging CT abdomen/pelvis 04/10/2014 with no evidence of recurrent/metastatic disease.  CT abdomen/pelvis 05/12/2018 without evidence of local recurrence or metastatic disease. History of right lower extremity DVT June 2018. Evaluation in the emergency department 03/08/2018 for right leg redness, swelling and tenderness.  Venous Doppler negative for DVT.  Incidental finding of a possible 3.3 cm right inguinal lymph node.  CT abdomen/pelvis 05/12/2018 showed stable small inguinal lymph nodes bilaterally. Chronic renal failure Monoclonal gammopathy of unknown significance 01/09/2019 SPEP with M spike 1.5  07/05/2019 SPEP with M spike 1.9 Hypertension Prostate cancer-on surveillance, followed by urology Disposition: Mr. Abdallah appears stable.  He is in clinical remission from the gastrointestinal stromal tumor.  There is no clinical or laboratory evidence for progression to multiple myeloma.  We will follow up on the myeloma panel from today.  He will return for an office and lab visit in 6  months. Determined patient verbalizes understanding of his prescribed treatment plan and recommendations for ongoing follow up Discussed plans with patient for ongoing care management follow up and provided patient with direct contact information for care management team  Hypertension Interventions:  (Status:  Goal Met.) Long Term Goal Last practice recorded BP readings:  BP Readings from Last 3 Encounters:  12/12/21 (!) 142/84  12/04/21 139/85  11/20/21 136/80  Most recent eGFR/CrCl:  Lab Results  Component Value Date   EGFR 61 11/20/2021    No components found for: "CRCL" Evaluation of current treatment plan related to hypertension self management and patient's adherence to plan as established by provider Reviewed medications with patient and discussed importance of compliance Counseled on the importance of exercise goals with target of 150 minutes per week Advised patient, providing education and rationale, to monitor blood pressure daily and record, calling PCP for findings outside established parameters Provided education on prescribed diet low Sodium  Discussed complications of poorly controlled blood pressure such as heart disease, stroke, circulatory complications, vision complications, kidney impairment, sexual dysfunction Educated patient regarding orthostatic hypotension precautions  Assessed for reoccurring syncope episodes since last RN CM encounter, patient denies   Fatigue Interventions:  (Status:  Goal Met.)  Long Term Goal Evaluation of current treatment plan related to  Fatigue , self-management and patient's adherence to plan as established by provider Determined patient is feeling more energetic and is working with outpatient PT with good results Discussed patient feels is getting stronger and has increased endurance Reinforced to  patient the importance of adherence to his prescribed HEP, encouraged patient to pace himself and balance his activity with rest Discussed  plans with patient for ongoing care management follow up and provided patient with direct contact information for care management team  Patient Goals/Self-Care Activities: Take all medications as prescribed Attend all scheduled provider appointments Call pharmacy for medication refills 3-7 days in advance of running out of medications Perform all self care activities independently  Call provider office for new concerns or questions  check blood pressure daily keep a blood pressure log take blood pressure log to all doctor appointments call doctor for signs and symptoms of high blood pressure take medications for blood pressure exactly as prescribed begin an exercise program report new symptoms to your doctor Try diffusing Lavender essential oil and or showering/bathing with lavender body wash prior to bedtime Try to drink 6-8 glasses of water each day   Follow Up Plan:  No further follow up required         Plan: {CM FOLLOW UP PLAN:25073}  SIG***

## 2021-12-15 NOTE — Patient Outreach (Signed)
  Care Coordination   Follow Up Visit Note   12/15/2021 Name: Lawrence Bailey MRN: [id number] DOB: 1940-11-27  Lawrence Bailey is a 81 y.o. year old male who sees Glendale Chard, MD for primary care. I spoke with  Lawrence Bailey by phone today  What matters to the patients health and wellness today?  To continue to get stronger while working with physical therapy.    SDOH assessments and interventions completed:   Yes  Care Coordination Interventions Activated:  Yes Care Coordination Interventions:  Yes, provided  Follow up plan: No further intervention required.  Encounter Outcome:  Pt. Visit Completed

## 2021-12-15 NOTE — Progress Notes (Signed)
This encounter was created in error - please disregard.

## 2021-12-15 NOTE — Patient Instructions (Signed)
Visit Information  Thank you for taking time to visit with me today. Please don't hesitate to contact me if I can be of assistance to you.   Following are the goals we discussed today:  Take all medications as prescribed Attend all scheduled provider appointments Call pharmacy for medication refills 3-7 days in advance of running out of medications Perform all self care activities independently  Call provider office for new concerns or questions  check blood pressure daily keep a blood pressure log take blood pressure log to all doctor appointments call doctor for signs and symptoms of high blood pressure take medications for blood pressure exactly as prescribed begin an exercise program report new symptoms to your doctor Try diffusing Lavender essential oil and or showering/bathing with lavender body wash prior to bedtime Try to drink 6-8 glasses of water each day   If you are experiencing a Mental Health or [place] or need someone to talk to, please call 1-800-273-TALK (toll free, 24 hour hotline)   Patient verbalizes understanding of instructions and care plan provided today and agrees to view in [facility]. Active MyChart status and patient understanding of how to access instructions and care plan via MyChart confirmed with patient.     No further follow up required  Barb Merino, RN, BSN, CCM Care Management Coordinator [place] [facility] Direct Phone: [phone]

## 2021-12-15 NOTE — Patient Outreach (Signed)
  Care Coordination   Follow Up Visit Note   12/15/2021 Name: Lawrence Bailey MRN: [id number] DOB: 1940/06/22  Lawrence Bailey is a 81 y.o. year old male who sees Glendale Chard, MD for primary care. I spoke with  Lawrence Bailey by phone today  What matters to the patients health and wellness today?  To continue to get stronger while working with physical therapy.    Goals Addressed     Patient Stated     To continue to get stronger while working with physical therapy (pt-stated)        Care Coordination Interventions: Provided written and verbal education re: potential causes of falls and Fall prevention strategies Reviewed medications and discussed potential side effects of medications such as dizziness and frequent urination Advised patient of importance of notifying provider of falls Assessed for signs and symptoms of orthostatic hypotension Assessed patients knowledge of fall risk prevention secondary to previously provided education Assessed social determinant of health barriers    SDOH assessments and interventions completed:   Yes   Care Coordination Interventions Activated:  Yes Care Coordination Interventions:  Yes, provided  Follow up plan: No further intervention required.  Encounter Outcome:  Pt. Visit Completed

## 2021-12-17 ENCOUNTER — Encounter: Payer: Self-pay | Admitting: Physical Therapy

## 2021-12-17 ENCOUNTER — Ambulatory Visit: Payer: No Typology Code available for payment source | Admitting: Physical Therapy

## 2021-12-17 DIAGNOSIS — R2681 Unsteadiness on feet: Secondary | ICD-10-CM

## 2021-12-17 DIAGNOSIS — R262 Difficulty in walking, not elsewhere classified: Secondary | ICD-10-CM

## 2021-12-17 DIAGNOSIS — M6281 Muscle weakness (generalized): Secondary | ICD-10-CM

## 2021-12-17 NOTE — Therapy (Addendum)
PHYSICAL THERAPY UNPLANNED DISCHARGE SUMMARY   Visits from Start of Care: 5  Current functional level related to goals / functional outcomes: Current status unknown   Remaining deficits: Current status unknown   Education / Equipment: Pt has not returned since visit listed below  Patient goals were not assessed. Patient is being discharged due to not returning since the last visit.  (the below note was addended to include the above D/C summary on 01/01/22)  OUTPATIENT PHYSICAL THERAPY TREATMENT NOTE   Patient Name: Lawrence Bailey MRN: [id number] DOB:1940-12-21, 81 y.o., male Today's Date: 12/17/2021  PCP: Glendale Chard, MD REFERRING PROVIDER: Glendale Chard, MD  END OF SESSION:   PT End of Session - 12/17/21 1535     Visit Number 5    Number of Visits 17    Date for PT Re-Evaluation 01/22/22    Authorization Type [employer]    PT Start Time 6301    PT Stop Time 6010    PT Time Calculation (min) 40 min    Activity Tolerance Patient tolerated treatment well    Behavior During Therapy [facility] for tasks assessed/performed             Past Medical History:  Diagnosis Date   BPH (benign prostatic hyperplasia)    Coronary artery disease    GIST (gastrointestinal stromal tumor), 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   gleevac comp 04/2008   Glaucoma    Hypertension    Prostate cancer ([place])    Renal insufficiency    Past Surgical History:  Procedure Laterality Date   ABDOMINAL SURGERY     EYE SURGERY     HERNIA REPAIR     KNEE SURGERY     Patient Active Problem List   Diagnosis Date Noted   Bilateral impacted cerumen 07/27/2021   Paresthesia of both feet 07/27/2021   Fall (on) (from) other stairs and steps, initial encounter 07/27/2021   Recurrent falls 07/27/2021   MGUS (monoclonal gammopathy of unknown significance) 07/27/2021   Chest pain 08/28/2020   Constipation 08/28/2020   Cough 08/28/2020   Dysphagia 08/28/2020   Hemorrhage of rectum and anus  08/28/2020   Atherosclerosis of aorta ([place]) 07/09/2020   Hypertensive nephropathy 05/02/2020   Malignant neoplasm of prostate ([place]) 07/11/2019   Glaucoma 02/28/2018   Syncope and collapse    Syncope 11/08/2016   Stage 3 chronic kidney disease ([place]) 11/08/2016   Essential hypertension 11/08/2016   Hyperglycemia 11/08/2016   Pulmonary contusion 02/04/2012   Thigh hematoma 02/04/2012   H/O malignant gastrointestinal stromal tumor (GIST) 07/20/2011    REFERRING DIAG:  R29.6 (ICD-10-CM) - Recurrent falls  R20.2 (ICD-10-CM) - Paresthesia of both feet    THERAPY DIAG:  Difficulty in walking, not elsewhere classified  Muscle weakness (generalized)  Unsteadiness on feet  Rationale for Evaluation and Treatment Rehabilitation  PERTINENT HISTORY: Hx of stomach cx, prostate cx, hx of stroke about 20 years, renal insufficiency, HTN, CAD  PRECAUTIONS: Fall  SUBJECTIVE: Pt reports that he is tired today after eating a big mean but feels that PT is helping him get stronger and feel more steady.  PAIN:  Are you having pain? Yes: NPRS scale: 4/10 Pain location: knees, hips, shoulders Pain description: achy   OBJECTIVE: (objective measures completed at initial evaluation unless otherwise dated)   DIAGNOSTIC FINDINGS: None related to primary problem   PATIENT SURVEYS:  FOTO 53%, predicted 57% in 11 visits   COGNITION:           Overall  cognitive status: Within functional limits for tasks assessed                          SENSATION: Not tested     MUSCLE LENGTH: Hamstrings: moderate tightness BIL Thomas test: moderate tightness BIL   POSTURE: rounded shoulders, decreased lumbar lordosis, increased thoracic kyphosis, and flexed trunk        LOWER EXTREMITY MMT:   MMT Right eval Left eval R/L 7/26   Hip flexion 4/5 4/5 4+/4+   Hip extension        Hip abduction        Hip adduction        Knee flexion 4+/5 4+/5    Knee extension 4+/5 4+/5    Ankle dorsiflexion 4+/5  4+/5    Ankle plantarflexion 4+/5 4+/5     (Blank rows = not tested)   BERG BALANCE TEST Sitting to Standing: 3.      Stands independently using hands Standing Unsupported: 4.      Stands safely for 2 minutes Sitting Unsupported: 4.     Sits for 2 minutes independently Standing to Sitting: 3.     Controls descent with hands  Transfers: 3.     Transfers safely definite use of hands Standing with eyes closed: 3.     Stands 10 seconds with supervision Standing with feet together: 3.     Stands for 1 minute with supervision Reaching forward with outstretched arm: 4.     Reaches forward 10 inches Retrieving object from the floor: 2.     Unable to pick up, but reaches within 1-2 inches independently Turning to look behind: 4.     Looks behind from both sides and weight shifts well Turning 360 degrees: 4.     Able to turn in </=4 seconds  Place alternate foot on stool: 4.     Completes 8 steps in 20 seconds     Standing with one foot in front: 0.     Loses balance while standing/stepping Standing on one foot: 1.     Holds <3 seconds   Total Score: 42/56     FUNCTIONAL TESTS:  5 times sit to stand: 20 seconds with regular posterior LOB Timed up and go (TUG): Assess next visit Squat: 25%, limited by posterior LO and knee pain   GAIT: Distance walked: 20 ft Assistive device utilized: None Level of assistance: Complete Independence Comments: Decreased gait speed and stride length       TODAY'S TREATMENT:  [facility] Adult PT Treatment:                                                DATE: 12/17/2021 Therapeutic Exercise: Nustep level 6 x 5 mins (seat 9) Modified thomas stretch EOM x2' BIL (rec fem) STS- 2x10 - with 5#  Supine bridge staggered - 2x10 ea Step up - 4'' 15x ea no UE support in // CGA  In // bars: Lateral walking with GTB at knees Hurdle walking no UE support  Neuromuscular re-ed (performed in // bars): Semi tandem (100%) on airex 45" Tandem walking   [facility] Adult PT  Treatment:  DATE: 12/11/2021 Therapeutic Exercise: Nustep level 6 x 5 mins (seat 9) Modified thomas stretch EOM x2' BIL (rec fem) Chair squat - 2x10 Supine bridge staggered - 15x ea Step up - 4'' 15x ea no UE support in // CGA  In // bars: Lateral walking with GTB at knees Hurdle walking no UE support  Neuromuscular re-ed (performed in // bars): Semi tandem (75%) on airex 45"  [facility] Adult PT Treatment:                                                DATE: 12/04/2021 Therapeutic Exercise: Nustep level 6 x 5 mins (seat 9) Modified thomas stretch EOM x2' BIL (rec fem) Chair squat - 10x Supine bridge - 2x10  In // bars: Lateral walking with GTB at knees Hurdle walking - unilateral hand hold step to pattern  Neuromuscular re-ed (performed in // bars): Romberg stance EC x45"  Tandem stance x45" BIL Semi tandem (50%) on airex 45"  [facility] Adult PT Treatment:                                                DATE: 12/02/2021 Therapeutic Exercise: Nustep level 5 x 5 mins Slant board gastroc stretch x1' Modified thomas stretch EOM x1' BIL Seated hamstring stretch 2x30" BIL Squat to EOM with airex tap x10 (hands on thighs) STS arms crossed x10 Figure 4 piriformis stretch 2x30" BIL In // bars: Heel/toe raises 2x10 Standing marching 2x10 BIL Standing hip abduction 2x10 BIL Standing hip extension 2x10 BIL Neuromuscular re-ed (performed in // bars): Romberg stance EO/EC x30" each  Tandem stance x30" BIL Tandem walking x2 laps intermittent UE support Romberg stance on airex 2x30"   11/20/2021: Demonstrated and issued HEP        HOME EXERCISE PROGRAM: Access Code: [email or web address] URL: [email or web address] Date: 11/20/2021 Prepared by: Vanessa    Exercises - Squat with Chair Touch  - 1 x daily - 7 x weekly - 2 sets - 10 reps - Standing Tandem Balance with Counter Support  - 1 x daily - 7 x weekly - 3 sets - 30-sec  hold - Heel Raises with Counter Support  - 1 x daily - 7 x weekly - 3 sets - 10 reps - Sidelying Hip Abduction  - 1 x daily - 7 x weekly - 2 sets - 10 reps - 3-sec hold   ASSESSMENT:   CLINICAL IMPRESSION: Mitchel tolerated session well.  He was having some increased L knee pain today which made some SLS activities more difficult.  He does show improvement in balance with progression to semi tandem on foam to (100%).  Hurdle walking is improved but he does still show instability in SLS L>R.    OBJECTIVE IMPAIRMENTS Abnormal gait, decreased activity tolerance, decreased balance, decreased endurance, decreased mobility, difficulty walking, decreased strength, increased edema, impaired flexibility, impaired sensation, improper body mechanics, and pain.    ACTIVITY LIMITATIONS carrying, lifting, bending, squatting, and locomotion level   PARTICIPATION LIMITATIONS: shopping, community activity, and yard work   PERSONAL FACTORS Age, Time since onset of injury/illness/exacerbation, and 3+ comorbidities: See medical hx  are also affecting patient's functional outcome.    REHAB POTENTIAL: Fair Due to time since onset of  impairments, multiple comorbidities, and age   CLINICAL DECISION MAKING: Stable/uncomplicated   EVALUATION COMPLEXITY: Low     GOALS: Goals reviewed with patient? Yes   SHORT TERM GOALS: Target date: 12/18/2021  Pt will report understanding and adherence to initial HEP in order to promote independence in the management of primary impairments. Baseline: HEP provided at eval Goal status: MET 7/20     LONG TERM GOALS: Target date: 01/14/2022    Pt will achieve a FOTO score of 57% in order to demonstrate improved functional ability as it relates to his balance impairments. Baseline: 53% Goal status: INITIAL   2.  Pt will achieve a BERG score of 48/56 or higher in order to demonstrate improved functional balance. Baseline: 42/56 Goal status: INITIAL   3.  Pt will achieve a  5xSTS in 14 seconds or less without LOB in order to demonstrate safe functional transfers. Baseline: 20 seconds with posterior LOB Goal status: INITIAL   4.  Pt will achieve BIL global LE strength of 5/5 in order to return to walking for exercise with less limitation. Baseline: See MMT chart Goal status: INITIAL       PLAN: PT FREQUENCY: 2x/week   PT DURATION: 8 weeks   PLANNED INTERVENTIONS: Therapeutic exercises, Therapeutic activity, Neuromuscular re-education, Balance training, Gait training, Patient/Family education, Joint mobilization, Stair training, Aquatic Therapy, Dry Needling, Electrical stimulation, Cryotherapy, Taping, Vasopneumatic device, Biofeedback, Manual therapy, and Re-evaluation   PLAN FOR NEXT SESSION: Progress LE strengthening, dynamic and static balance training    Mathis Dad, PT 12/17/2021, 5:00 PM

## 2021-12-19 ENCOUNTER — Ambulatory Visit: Payer: No Typology Code available for payment source | Admitting: Physical Therapy

## 2021-12-19 NOTE — Progress Notes (Signed)
This encounter was created in error - please disregard.

## 2021-12-22 DIAGNOSIS — N1831 Chronic kidney disease, stage 3a: Secondary | ICD-10-CM | POA: Diagnosis not present

## 2021-12-22 DIAGNOSIS — I129 Hypertensive chronic kidney disease with stage 1 through stage 4 chronic kidney disease, or unspecified chronic kidney disease: Secondary | ICD-10-CM | POA: Diagnosis not present

## 2021-12-24 DIAGNOSIS — M25561 Pain in right knee: Secondary | ICD-10-CM | POA: Diagnosis not present

## 2021-12-24 DIAGNOSIS — M25562 Pain in left knee: Secondary | ICD-10-CM | POA: Diagnosis not present

## 2021-12-24 DIAGNOSIS — M170 Bilateral primary osteoarthritis of knee: Secondary | ICD-10-CM | POA: Diagnosis not present

## 2021-12-24 DIAGNOSIS — M1711 Unilateral primary osteoarthritis, right knee: Secondary | ICD-10-CM | POA: Diagnosis not present

## 2021-12-24 DIAGNOSIS — M1712 Unilateral primary osteoarthritis, left knee: Secondary | ICD-10-CM | POA: Diagnosis not present

## 2022-01-09 ENCOUNTER — Other Ambulatory Visit: Payer: Self-pay | Admitting: Internal Medicine

## 2022-01-09 ENCOUNTER — Telehealth: Payer: Self-pay

## 2022-01-09 NOTE — Chronic Care Management (AMB) (Signed)
01/09/2022- APPOINTMENT REMINDER   Lawrence Bailey was reminded to have all medications, supplements and any blood glucose and blood pressure readings available for review with Orlando Penner, Pharm. D, at his telephone visit on 01/13/2022 at 11:00 am.   Questions: Have you had any recent office visit or specialist visit outside of [place]? NO  Are there any concerns you would like to discuss during your office visit? NO  Are you having any problems obtaining your medications? (Whether it pharmacy issues or cost) NO  If patient has any PAP medications ask if they are having any problems getting their PAP medication or refill? N/a  Care Gaps: Zoster Vaccines- Shingrix- Overdue - never done (Dose 1 of 2) Nov 17 2020 COVID-19 Vaccine (5 - Pfizer risk series)-Last completed: Sep 22, 2020 Covid-19:  09/22/2020, 05/24/2020, 07/24/2019, 07/01/2019  Last AWV- 11/20/2021, next scheduled 12/10/2022 Colonoscopy: not required Recommended yearly ophthalmology/optometry visit for glaucoma screening and checkup Recommended yearly dental visit for hygiene and checkup Influenza vaccine: due 12/23/2021 Pneumococcal vaccine: completed 07/24/2021 Tdap vaccine: completed 03/15/2019, due 03/14/2029  Star Rating Drug: Valsartan-hctz 320/25 mg last filled 11/14/2021 90 DS and 08/22/2021 90 DS [facility] Atorvastatin 10 mg last filled 10/27/2021 90 DS and 07/28/2021 90 DS [facility]  Any gaps in medications fill history? No    SIG:  Pattricia Boss, [place] Pharmacist Assistant [phone]

## 2022-01-10 ENCOUNTER — Other Ambulatory Visit: Payer: Self-pay | Admitting: Internal Medicine

## 2022-01-13 ENCOUNTER — Ambulatory Visit (INDEPENDENT_AMBULATORY_CARE_PROVIDER_SITE_OTHER): Payer: No Typology Code available for payment source

## 2022-01-13 ENCOUNTER — Other Ambulatory Visit: Payer: Self-pay | Admitting: Internal Medicine

## 2022-01-13 DIAGNOSIS — I700 Atherosclerosis of aorta: Secondary | ICD-10-CM

## 2022-01-13 DIAGNOSIS — I129 Hypertensive chronic kidney disease with stage 1 through stage 4 chronic kidney disease, or unspecified chronic kidney disease: Secondary | ICD-10-CM

## 2022-01-13 NOTE — Progress Notes (Unsigned)
Chronic Care Management Pharmacy Note  01/14/2022 Name:  Lawrence Bailey MRN:  [id number] DOB:  1941/02/09  Summary: Patient reports he received a new prescription for pantoprazole   Recommendations/Changes made from today's visit: Collaborate with PCP to determine medications patient should be taking for GERD  Recommend patient take Amlodipine 10 mg tablet in the evening.  Recommend patient receive shingrix vaccine.   Plan: Patient to start taking Amlodipine in the evening and check BP at least five days per week.  Pantoprazole to be discontinued, patient to continue to take Famotidine.  Patient to receive shingrix vaccine at PCP office appointment scheduled    Subjective: Lawrence Bailey is an 81 y.o. year old male who is a primary patient of Glendale Chard, MD.  The CCM team was consulted for assistance with disease management and care coordination needs.    Engaged with patient by telephone for follow up visit in response to provider referral for pharmacy case management and/or care coordination services. Patient reports that sometimes in the morning he gets a little woozy after taking his medications.   Consent to Services:  The patient was given information about Chronic Care Management services, agreed to services, and gave verbal consent prior to initiation of services.  Please see initial visit note for detailed documentation.   Patient Care Team: Glendale Chard, MD as PCP - General (Internal Medicine) Johnathan Hausen, MD as Consulting Physician (General Surgery) Carol Ada, MD as Consulting Physician (Gastroenterology) Charolette Forward, MD as Consulting Physician (Cardiology) Warden Fillers, MD as Consulting Physician (Ophthalmology) Mayford Knife, [facility] (Pharmacist) Glendale Chard, MD as Referring Physician (Internal Medicine)  Recent office visits: 11/20/2021 PCP OV  Recent consult visits: 12/17/2021 PT visit   Hospital visits: None in previous 6  months   Objective:  Lab Results  Component Value Date   CREATININE 1.19 11/20/2021   BUN 19 11/20/2021   EGFR 61 11/20/2021   GFRNONAA 60 (L) 01/30/2021   GFRAA 62 07/09/2020   NA 137 11/20/2021   K 4.0 11/20/2021   CALCIUM 9.4 11/20/2021   CO2 28 11/20/2021   GLUCOSE 98 11/20/2021    Lab Results  Component Value Date/Time   HGBA1C 5.6 07/09/2020 03:30 PM   HGBA1C 5.6 06/15/2018 03:03 PM   MICROALBUR 30 11/01/2019 05:21 PM    Last diabetic Eye exam: No results found for: "HMDIABEYEEXA"  Last diabetic Foot exam: No results found for: "HMDIABFOOTEX"   Lab Results  Component Value Date   CHOL 154 05/02/2020   HDL 57 05/02/2020   LDLCALC 84 05/02/2020   TRIG 65 05/02/2020   CHOLHDL 2.7 05/02/2020       Latest Ref Rng & Units 11/20/2021    3:55 PM 01/30/2021   10:12 AM 07/30/2020    9:21 AM  Hepatic Function  Total Protein 6.0 - 8.5 g/dL 7.8  8.3  8.3   Albumin 3.6 - 4.6 g/dL 3.8  3.9  3.8   AST 0 - 40 IU/L 16  16  16    ALT 0 - 44 IU/L 13  13  13    Alk Phosphatase 44 - 121 IU/L 48  44  50   Total Bilirubin 0.0 - 1.2 mg/dL 0.4  0.6  0.6     Lab Results  Component Value Date/Time   TSH 1.640 07/24/2021 09:41 AM   TSH 0.969 11/08/2016 10:42 AM       Latest Ref Rng & Units 11/20/2021    3:55 PM 01/30/2021   10:12  AM 07/30/2020    9:21 AM  CBC  WBC 3.4 - 10.8 x10E3/uL 5.8  6.1  4.3   Hemoglobin 13.0 - 17.7 g/dL 11.1  11.7  11.9   Hematocrit 37.5 - 51.0 % 34.2  38.0  38.5   Platelets 150 - 450 x10E3/uL 154  145  165     Lab Results  Component Value Date/Time   VD25OH 31.5 11/20/2021 03:55 PM    Clinical ASCVD: Yes  The ASCVD Risk score (Arnett DK, et al., 2019) failed to calculate for the following reasons:   The 2019 ASCVD risk score is only valid for ages 48 to 34       11/20/2021    4:04 PM 11/20/2021    3:12 PM 11/13/2020    2:17 PM  Depression screen PHQ 2/9  Decreased Interest 0 0 0  Down, Depressed, Hopeless 0 0 0  PHQ - 2 Score 0 0 0       Social History   Tobacco Use  Smoking Status Never  Smokeless Tobacco Never   BP Readings from Last 3 Encounters:  12/12/21 (!) 142/84  12/04/21 139/85  11/20/21 136/80   Pulse Readings from Last 3 Encounters:  12/12/21 (!) 59  11/20/21 69  11/20/21 69   Wt Readings from Last 3 Encounters:  12/12/21 184 lb 3.2 oz (83.6 kg)  11/20/21 188 lb (85.3 kg)  11/20/21 188 lb (85.3 kg)   BMI Readings from Last 3 Encounters:  12/12/21 28.17 kg/m  11/20/21 28.75 kg/m  11/20/21 28.75 kg/m    Assessment/Interventions: Review of patient past medical history, allergies, medications, health status, including review of consultants reports, laboratory and other test data, was performed as part of comprehensive evaluation and provision of chronic care management services.   SDOH:  (Social Determinants of Health) assessments and interventions performed: Yes  SDOH Screenings   Alcohol Screen: Not on file  Depression (PHQ2-9): Low Risk  (11/20/2021)   Depression (PHQ2-9)    PHQ-2 Score: 0  Financial Resource Strain: Low Risk  (11/20/2021)   Overall Financial Resource Strain (CARDIA)    Difficulty of Paying Living Expenses: Not hard at all  Food Insecurity: No Food Insecurity (11/20/2021)   Hunger Vital Sign    Worried About Running Out of Food in the Last Year: Never true    [place] in the Last Year: Never true  Housing: Low Risk  (07/25/2020)   Housing    Last Housing Risk Score: 0  Physical Activity: Inactive (11/20/2021)   Exercise Vital Sign    Days of Exercise per Week: 0 days    Minutes of Exercise per Session: 0 min  Social Connections: Not on file  Stress: No Stress Concern Present (11/20/2021)   [place]    Feeling of Stress : Not at all  Tobacco Use: Low Risk  (12/17/2021)   Patient History    Smoking Tobacco Use: Never    Smokeless Tobacco Use: Never    Passive Exposure: Not on file   Transportation Needs: No Transportation Needs (11/20/2021)   PRAPARE - Transportation    Lack of Transportation (Medical): No    Lack of Transportation (Non-Medical): No    CCM Care Plan  Allergies  Allergen Reactions   Other     Bee sting    Sucralfate Other (See Comments)    Medications Reviewed Today     Reviewed by Mathis Dad, PT (Physical Therapist) on 12/17/21  at [place] List Status: <None>   Medication Order Taking? Sig Documenting Provider Last Dose Status Informant  amLODipine (NORVASC) 10 MG tablet [id number] No Take 10 mg by mouth daily. [provider] Taking Active   aspirin 81 MG tablet [id number] No Take 1 tablet (81 mg total) by mouth daily. Saverio Danker, PA-C Taking Active Self  atorvastatin (LIPITOR) 10 MG tablet [id number] No TAKE 1 TABLET BY MOUTH MONDAY - FRIDAY AT BEDTIME Glendale Chard, MD Taking Active   brimonidine [facility]) 0.2 % ophthalmic solution [id number] No 1 drop 2 (two) times daily. [provider] Taking Active   Cholecalciferol (VITAMIN D3) 125 MCG (5000 UT) CAPS [id number] No Take 5,000 capsules by mouth daily. [provider] Taking Active   docusate sodium (COLACE) 100 MG capsule [id number] No Take 100 mg by mouth 2 (two) times daily. [provider] Taking Active   dorzolamide-timolol (COSOPT) 22.3-6.8 MG/ML ophthalmic solution [id number] No Place 1 drop into both eyes 2 (two) times daily. [provider] Taking Active Self  famotidine (PEPCID) 20 MG tablet [id number] No TAKE 1 TABLET BY MOUTH EVERY DAY Glendale Chard, MD Taking Active   finasteride (PROSCAR) 5 MG tablet [id number] No Take 5 mg by mouth daily. [provider] Taking Active   fluticasone (FLONASE) 50 MCG/ACT nasal spray [id number] No INHALE 2 SPRAYS INTO EACH NOSTRIL EVERY NIGHT Rozetta Nunnery, MD Taking Active   latanoprost (XALATAN) 0.005 % ophthalmic solution [id number] No Place 1 drop into both eyes at bedtime.  [provider] Taking Active Self  pantoprazole (PROTONIX) 40 MG tablet [id number] No TAKE 1 TABLET BY MOUTH EVERY DAY  Patient not taking: Reported on 12/15/2021   Glendale Chard, MD Not Taking Active   sildenafil (REVATIO) 20 MG tablet [id number] No Take 20 mg by mouth 3 (three) times daily. [provider] Taking Active            Med Note Amedeo Plenty, PAMELA   Wed Nov 27, 2020 11:39 AM)    tamsulosin (FLOMAX) 0.4 MG CAPS capsule [id number] No Take 0.4 mg by mouth. [provider] Taking Active   traMADol (ULTRAM) 50 MG tablet [id number] No Take 1 tablet (50 mg total) by mouth every 6 (six) hours as needed. Minette Brine, FNP Taking Active   valsartan-hydrochlorothiazide (DIOVAN-HCT) 320-25 MG tablet [id number] No Take 1 tablet by mouth daily. Glendale Chard, MD Taking Active             Patient Active Problem List   Diagnosis Date Noted   Bilateral impacted cerumen 07/27/2021   Paresthesia of both feet 07/27/2021   Fall (on) (from) other stairs and steps, initial encounter 07/27/2021   Recurrent falls 07/27/2021   MGUS (monoclonal gammopathy of unknown significance) 07/27/2021   Chest pain 08/28/2020   Constipation 08/28/2020   Cough 08/28/2020   Dysphagia 08/28/2020   Hemorrhage of rectum and anus 08/28/2020   Atherosclerosis of aorta ([place]) 07/09/2020   Hypertensive nephropathy 05/02/2020   Malignant neoplasm of prostate ([place]) 07/11/2019   Glaucoma 02/28/2018   Syncope and collapse    Syncope 11/08/2016   Stage 3 chronic kidney disease ([place]) 11/08/2016   Essential hypertension 11/08/2016   Hyperglycemia 11/08/2016   Pulmonary contusion 02/04/2012   Thigh hematoma 02/04/2012   H/O malignant gastrointestinal stromal tumor (GIST) 07/20/2011    Immunization History  Administered Date(s) Administered   Fluad Quad(high Dose 65+) 03/15/2019, 07/09/2020   PFIZER(Purple Top)SARS-COV-2 Vaccination 07/01/2019, 07/24/2019, 05/24/2020, 09/22/2020   PNEUMOCOCCAL  CONJUGATE-20 07/24/2021  Tdap 03/15/2019    Conditions to be addressed/monitored:  Hypertension and Hyperlipidemia  There are no care plans that you recently modified to display for this patient.     Medication Assistance: None required.  Patient affirms current coverage meets needs.  Compliance/Adherence/Medication fill history: Care Gaps: Shingrix Vaccine COVID-19 Vaccine Influenza Vaccine  Star-Rating Drugs: Atorvastatin 10 mg tablet  Valsartan-HCTZ 320-25 mg tablet   Patient's preferred pharmacy is:  [id number] [place], [place] - [address] [id number]: [phone]: [phone] Uses pill box? Yes Pt endorses 90% compliance  We discussed: Benefits of medication synchronization, packaging and delivery as well as enhanced pharmacist oversight with [employer]. Patient decided to: Continue current medication management strategy  Care Plan and Follow Up Patient Decision:  Patient agrees to Care Plan and Follow-up.  Plan: The patient has been provided with contact information for the care management team and has been advised to call with any health related questions or concerns.   VOrlando Penner CPP, PharmD Clinical Pharmacist Practitioner [facility] [phone]

## 2022-01-14 NOTE — Patient Instructions (Signed)
Visit Information It was great speaking with you today!  Please let me know if you have any questions about our visit.   Goals Addressed             This Visit's Progress    Manage My Medicine       Timeframe:  Long-Range Goal Priority:  High Start Date:     08/01/2020                       Expected End Date:                       In progress:   - call for medicine refill 2 or 3 days before it runs out - call if I am sick and can't take my medicine - learn to read medicine labels - use an alarm clock or phone to remind me to take my medicine    Why is this important?   These steps will help you keep on track with your medicines.         Patient Care Plan: CCM Pharmacy Care Plan     Problem Identified: HTN, Atherosclerosis of Aorta   Priority: High     Long-Range Goal: Disease Management   Start Date: 08/01/2020  Recent Progress: On track  Priority: High  Note:   Current Barriers:  Unable to independently monitor therapeutic efficacy  Pharmacist Clinical Goal(s):  Patient will achieve adherence to monitoring guidelines and medication adherence to achieve therapeutic efficacy through collaboration with PharmD and provider.   Interventions: 1:1 collaboration with Glendale Chard, MD regarding development and update of comprehensive plan of care as evidenced by provider attestation and co-signature Inter-disciplinary care team collaboration (see longitudinal plan of care) Comprehensive medication review performed; medication list updated in electronic medical record  Hypertension (BP goal <140/90) -Controlled -Current treatment: Valsartan-hydrochlorothiazide 320-25 mg tablet once per day. Appropriate, Effective, Safe, Accessible Amlodipine 10 mg table once per day Appropriate, Effective, Safe, Accessible Sildenafil 20 mg tablet take 20 mg tablet by mouth three times per daily Appropriate, Effective, Safe, Accessible -Current home readings: he is checking his BP at  home - 142/75, 130/81 -Current dietary habits: he is doing better, he reports that he is still not eating any salt  -Current exercise habits: patient report that they are doing well with exercise, he is going to restart physical therapy -Denies hypotensive/hypertensive symptoms -Educated on Importance of home blood pressure monitoring; Proper BP monitoring technique; Symptoms of hypotension and importance of maintaining adequate hydration; -Mr. Molder reports sometimes he feels woozy after taking his morning medications.  -In the morning he is taking Norvasc, and Valsartan-HCTZ in the morning  -Counseled to monitor BP at home at least 5 days per week, document, and provide log at future appointments -Collaborated with patient and he will start taking the Amlodipine 10 mg table at bedtime with his Atorvastatin  Atherosclerosis of Aorta: (LDL goal < 70) -Controlled -Current treatment: Atorvastatin 10 mg tablet Monday through Friday Appropriate, Effective, Safe, Accessible -Educated on Cholesterol goals;  Benefits of statin for ASCVD risk reduction; -Recommended to continue current medication  Patient Goals/Self-Care Activities Patient will:  - take medications as prescribed as evidenced by patient report and record review  Follow Up Plan: The patient has been provided with contact information for the care management team and has been advised to call with any health related questions or concerns.        Patient agreed to  services and verbal consent obtained.   The patient verbalized understanding of instructions, educational materials, and care plan provided today and agreed to receive a mailed copy of patient instructions, educational materials, and care plan.   Orlando Penner, PharmD Clinical Pharmacist [facility] [phone]

## 2022-01-15 DIAGNOSIS — I129 Hypertensive chronic kidney disease with stage 1 through stage 4 chronic kidney disease, or unspecified chronic kidney disease: Secondary | ICD-10-CM | POA: Diagnosis not present

## 2022-01-15 DIAGNOSIS — K5900 Constipation, unspecified: Secondary | ICD-10-CM | POA: Diagnosis not present

## 2022-01-15 DIAGNOSIS — N1831 Chronic kidney disease, stage 3a: Secondary | ICD-10-CM | POA: Diagnosis not present

## 2022-01-15 DIAGNOSIS — K219 Gastro-esophageal reflux disease without esophagitis: Secondary | ICD-10-CM | POA: Diagnosis not present

## 2022-01-15 DIAGNOSIS — C61 Malignant neoplasm of prostate: Secondary | ICD-10-CM | POA: Diagnosis not present

## 2022-01-15 DIAGNOSIS — D472 Monoclonal gammopathy: Secondary | ICD-10-CM | POA: Diagnosis not present

## 2022-01-15 DIAGNOSIS — D649 Anemia, unspecified: Secondary | ICD-10-CM | POA: Diagnosis not present

## 2022-01-22 DIAGNOSIS — I129 Hypertensive chronic kidney disease with stage 1 through stage 4 chronic kidney disease, or unspecified chronic kidney disease: Secondary | ICD-10-CM

## 2022-01-25 DIAGNOSIS — R0602 Shortness of breath: Secondary | ICD-10-CM | POA: Diagnosis not present

## 2022-01-25 DIAGNOSIS — I10 Essential (primary) hypertension: Secondary | ICD-10-CM | POA: Diagnosis not present

## 2022-01-25 DIAGNOSIS — E860 Dehydration: Secondary | ICD-10-CM | POA: Diagnosis not present

## 2022-01-25 DIAGNOSIS — R7989 Other specified abnormal findings of blood chemistry: Secondary | ICD-10-CM | POA: Diagnosis not present

## 2022-01-25 DIAGNOSIS — R55 Syncope and collapse: Secondary | ICD-10-CM | POA: Diagnosis not present

## 2022-01-25 DIAGNOSIS — Z743 Need for continuous supervision: Secondary | ICD-10-CM | POA: Diagnosis not present

## 2022-01-25 DIAGNOSIS — J986 Disorders of diaphragm: Secondary | ICD-10-CM | POA: Diagnosis not present

## 2022-01-25 DIAGNOSIS — I509 Heart failure, unspecified: Secondary | ICD-10-CM | POA: Diagnosis not present

## 2022-01-25 DIAGNOSIS — R0789 Other chest pain: Secondary | ICD-10-CM | POA: Diagnosis not present

## 2022-01-25 DIAGNOSIS — I252 Old myocardial infarction: Secondary | ICD-10-CM | POA: Diagnosis not present

## 2022-01-25 DIAGNOSIS — Z5329 Procedure and treatment not carried out because of patient's decision for other reasons: Secondary | ICD-10-CM | POA: Diagnosis not present

## 2022-01-25 DIAGNOSIS — R6889 Other general symptoms and signs: Secondary | ICD-10-CM | POA: Diagnosis not present

## 2022-01-30 ENCOUNTER — Encounter: Payer: Self-pay | Admitting: Nurse Practitioner

## 2022-01-30 ENCOUNTER — Other Ambulatory Visit: Payer: Medicare Other

## 2022-01-30 ENCOUNTER — Inpatient Hospital Stay (HOSPITAL_BASED_OUTPATIENT_CLINIC_OR_DEPARTMENT_OTHER): Payer: No Typology Code available for payment source | Admitting: Nurse Practitioner

## 2022-01-30 ENCOUNTER — Ambulatory Visit: Payer: Medicare Other | Admitting: Oncology

## 2022-01-30 ENCOUNTER — Inpatient Hospital Stay: Payer: No Typology Code available for payment source | Attending: Nurse Practitioner

## 2022-01-30 VITALS — BP 172/93 | HR 62 | Temp 97.9°F | Resp 16 | Wt 179.2 lb

## 2022-01-30 DIAGNOSIS — D472 Monoclonal gammopathy: Secondary | ICD-10-CM | POA: Diagnosis not present

## 2022-01-30 DIAGNOSIS — N189 Chronic kidney disease, unspecified: Secondary | ICD-10-CM | POA: Insufficient documentation

## 2022-01-30 DIAGNOSIS — Z8509 Personal history of malignant neoplasm of other digestive organs: Secondary | ICD-10-CM

## 2022-01-30 DIAGNOSIS — I129 Hypertensive chronic kidney disease with stage 1 through stage 4 chronic kidney disease, or unspecified chronic kidney disease: Secondary | ICD-10-CM | POA: Diagnosis not present

## 2022-01-30 LAB — CBC WITH DIFFERENTIAL (CANCER CENTER ONLY)
Abs Immature Granulocytes: 0.01 10*3/uL (ref 0.00–0.07)
Basophils Absolute: 0 10*3/uL (ref 0.0–0.1)
Basophils Relative: 0 %
Eosinophils Absolute: 0 10*3/uL (ref 0.0–0.5)
Eosinophils Relative: 0 %
HCT: 37.8 % — ABNORMAL LOW (ref 39.0–52.0)
Hemoglobin: 12.1 g/dL — ABNORMAL LOW (ref 13.0–17.0)
Immature Granulocytes: 0 %
Lymphocytes Relative: 27 %
Lymphs Abs: 1.6 10*3/uL (ref 0.7–4.0)
MCH: 27.4 pg (ref 26.0–34.0)
MCHC: 32 g/dL (ref 30.0–36.0)
MCV: 85.5 fL (ref 80.0–100.0)
Monocytes Absolute: 0.4 10*3/uL (ref 0.1–1.0)
Monocytes Relative: 7 %
Neutro Abs: 3.8 10*3/uL (ref 1.7–7.7)
Neutrophils Relative %: 66 %
Platelet Count: 170 10*3/uL (ref 150–400)
RBC: 4.42 MIL/uL (ref 4.22–5.81)
RDW: 14.3 % (ref 11.5–15.5)
WBC Count: 5.9 10*3/uL (ref 4.0–10.5)
nRBC: 0 % (ref 0.0–0.2)

## 2022-01-30 LAB — CMP (CANCER CENTER ONLY)
ALT: 14 U/L (ref 0–44)
AST: 19 U/L (ref 15–41)
Albumin: 4 g/dL (ref 3.5–5.0)
Alkaline Phosphatase: 41 U/L (ref 38–126)
Anion gap: 6 (ref 5–15)
BUN: 20 mg/dL (ref 8–23)
CO2: 31 mmol/L (ref 22–32)
Calcium: 9.7 mg/dL (ref 8.9–10.3)
Chloride: 100 mmol/L (ref 98–111)
Creatinine: 1.2 mg/dL (ref 0.61–1.24)
GFR, Estimated: >60 mL/min (ref >60)
Glucose, Bld: 121 mg/dL — ABNORMAL HIGH (ref 70–99)
Potassium: 4 mmol/L (ref 3.5–5.1)
Sodium: 137 mmol/L (ref 135–145)
Total Bilirubin: 0.8 mg/dL (ref 0.3–1.2)
Total Protein: 8.8 g/dL — ABNORMAL HIGH (ref 6.5–8.1)

## 2022-01-30 NOTE — Progress Notes (Signed)
  [place] OFFICE PROGRESS NOTE   Diagnosis: History of gastric GIST, monoclonal gammopathy  INTERVAL HISTORY:   Lawrence Bailey returns as scheduled.  Appetite varies.  He has lost about 5 pounds over the past year.  No recurrent infections.  Chronic bilateral knee pain.  He has occasional abdominal pain which is relieved with a bowel movement.  He reports having a syncopal episode when he was in [place] last week.  He was evaluated at a local hospital and instructed to follow-up with cardiology.  He reports having an appointment with cardiology next week.  Objective:  Vital signs in last 24 hours:  Blood pressure (!) 172/93, pulse 62, temperature 97.9 F (36.6 C), temperature source Oral, resp. rate 16, weight 179 lb 3.2 oz (81.3 kg), SpO2 100 %.    HEENT: Soft mobile mass left lower posterior neck. Lymphatics: No palpable cervical, supraclavicular or axillary lymph nodes.  Question 1 cm medial left inguinal node. Resp: Lungs clear bilaterally. Cardio: Regular rate and rhythm. GI: Abdomen soft and nontender.  No hepatosplenomegaly.  No mass. Vascular: No leg edema.    Lab Results:  Lab Results  Component Value Date   WBC 5.9 01/30/2022   HGB 12.1 (L) 01/30/2022   HCT 37.8 (L) 01/30/2022   MCV 85.5 01/30/2022   PLT 170 01/30/2022   NEUTROABS 3.8 01/30/2022    Imaging:  No results found.  Medications: I have reviewed the patient's current medications.  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 Pathology showed a 6 cm GI stromal tumor. No vascular or lymphatic invasion. All surgical margins negative. Lymph nodes not sampled. Tumor confined to the submucosa. Mitotic activity 25-50 mitoses per high-power field. He completed 1 year of Gleevec 400 mg daily. Restaging CT abdomen/pelvis 04/10/2014 with no evidence of recurrent/metastatic disease.  CT abdomen/pelvis 05/12/2018 without  evidence of local recurrence or metastatic disease. History of right lower extremity DVT June 2018. Evaluation in the emergency department 03/08/2018 for right leg redness, swelling and tenderness.  Venous Doppler negative for DVT.  Incidental finding of a possible 3.3 cm right inguinal lymph node.  CT abdomen/pelvis 05/12/2018 showed stable small inguinal lymph nodes bilaterally. Chronic renal failure Monoclonal gammopathy of unknown significance, IgG lambda 01/09/2019 SPEP with M spike 1.5  07/05/2019 SPEP with M spike 1.9 07/30/2020 SPEP with M spike 2.2 Hypertension Prostate cancer-on surveillance, followed by urology  Disposition: [name] appears stable.  We reviewed the CBC and chemistry panel from today.  Hemoglobin is stable to improved.  Creatinine and calcium in normal range.  We will follow-up on the myeloma panel from today.  He has follow-up with cardiology 02/03/2022 due to the recent syncopal episode.  He will return for lab and follow-up here in 1 year.  We will adjust accordingly pending final results from the myeloma panel.    Ned Card ANP/GNP-BC   01/30/2022  1:38 PM

## 2022-02-01 LAB — IGG: IgG (Immunoglobin G), Serum: 3598 mg/dL — ABNORMAL HIGH (ref 603–1613)

## 2022-02-02 DIAGNOSIS — M1711 Unilateral primary osteoarthritis, right knee: Secondary | ICD-10-CM | POA: Diagnosis not present

## 2022-02-02 DIAGNOSIS — M170 Bilateral primary osteoarthritis of knee: Secondary | ICD-10-CM | POA: Diagnosis not present

## 2022-02-02 DIAGNOSIS — M1712 Unilateral primary osteoarthritis, left knee: Secondary | ICD-10-CM | POA: Diagnosis not present

## 2022-02-02 LAB — KAPPA/LAMBDA LIGHT CHAINS
Kappa free light chain: 30.6 mg/L — ABNORMAL HIGH (ref 3.3–19.4)
Kappa, lambda light chain ratio: 1.55 (ref 0.26–1.65)
Lambda free light chains: 19.8 mg/L (ref 5.7–26.3)

## 2022-02-03 ENCOUNTER — Other Ambulatory Visit: Payer: Self-pay | Admitting: Internal Medicine

## 2022-02-03 ENCOUNTER — Encounter: Payer: Self-pay | Admitting: Physician Assistant

## 2022-02-03 ENCOUNTER — Ambulatory Visit: Payer: No Typology Code available for payment source | Attending: Physician Assistant | Admitting: Physician Assistant

## 2022-02-03 ENCOUNTER — Ambulatory Visit (INDEPENDENT_AMBULATORY_CARE_PROVIDER_SITE_OTHER): Payer: No Typology Code available for payment source

## 2022-02-03 VITALS — BP 140/84 | HR 54 | Ht 68.0 in | Wt 180.8 lb

## 2022-02-03 DIAGNOSIS — R55 Syncope and collapse: Secondary | ICD-10-CM

## 2022-02-03 DIAGNOSIS — I10 Essential (primary) hypertension: Secondary | ICD-10-CM | POA: Diagnosis not present

## 2022-02-03 DIAGNOSIS — N1831 Chronic kidney disease, stage 3a: Secondary | ICD-10-CM

## 2022-02-03 LAB — PROTEIN ELECTROPHORESIS, SERUM
A/G Ratio: 0.9 (ref 0.7–1.7)
Albumin ELP: 3.8 g/dL (ref 2.9–4.4)
Alpha-1-Globulin: 0.2 g/dL (ref 0.0–0.4)
Alpha-2-Globulin: 0.5 g/dL (ref 0.4–1.0)
Beta Globulin: 0.7 g/dL (ref 0.7–1.3)
Gamma Globulin: 3 g/dL — ABNORMAL HIGH (ref 0.4–1.8)
Globulin, Total: 4.4 g/dL — ABNORMAL HIGH (ref 2.2–3.9)
M-Spike, %: 2.4 g/dL — ABNORMAL HIGH
Total Protein ELP: 8.2 g/dL (ref 6.0–8.5)

## 2022-02-03 NOTE — Progress Notes (Unsigned)
Enrolled for [employer] to mail a ZIO XT long term holter monitor to the patients address on file.   Dr. Smith to read. 

## 2022-02-03 NOTE — Progress Notes (Signed)
Cardiology Office Note:    Date:  02/03/2022   ID:  Lawrence Bailey, DOB 03/31/1941, MRN [id number]  PCP:  Glendale Chard, MD  [facility] Cardiologist:  Sinclair Grooms, MD  [facility] Electrophysiologist:  None   Chief Complaint: syncope   History of Present Illness:    Lawrence Bailey is a 81 y.o. male with a hx of aortic atherosclerosis, CKD IIIa, HTN with orthostatics, stroke (intracranial hemorrhage from an aneurysm) in 2008 with residual R hand weakness and prior syncope seen for follow up.   Prior hx of syncope felt related to micturition in setting of orthostasis. Recommended to stop Tamsulosin after discussion with urologist.   Echo 09/2020 with LVEF of 55%. Mild AI and mild MR.   Last seen by Dr. Tamala Julian 11/2021.  Patient was in [facility] last week.  While attending church, he had a syncope episode.  He was standing and fainted.  There was lots of people around him and it was "hot".  He got support from surrounding person who seated him in a chair.  He had a loss of consciousness.  There were medical personnel as well.  Noted faint pulse.  They applied ped but regained consciousness before defibrillation.  No CPR.  He was seen in the ER.  Plan was to admit him and perform some sort of study but he declined and discharged.  No records available for review.  He was given fluids.  Since then, no issue.  He denies chest pain, shortness of breath, orthopnea, PND, palpitation, dizziness or melena.   Past Medical History:  Diagnosis Date   BPH (benign prostatic hyperplasia)    Coronary artery disease    GIST (gastrointestinal stromal tumor), malignant ([place]) dx'd 04/2007   gleevac comp 04/2008   Glaucoma    Hypertension    Prostate cancer ([place])    Renal insufficiency     Past Surgical History:  Procedure Laterality Date   ABDOMINAL SURGERY     EYE SURGERY     HERNIA REPAIR     KNEE SURGERY      Current Medications: Current Meds  Medication Sig   amLODipine  (NORVASC) 10 MG tablet Take 10 mg by mouth daily.   aspirin 81 MG tablet Take 1 tablet (81 mg total) by mouth daily.   atorvastatin (LIPITOR) 10 MG tablet TAKE 1 TABLET BY MOUTH MONDAY - FRIDAY AT BEDTIME   brimonidine (ALPHAGAN) 0.2 % ophthalmic solution 1 drop 2 (two) times daily.   Cholecalciferol (VITAMIN D3) 125 MCG (5000 UT) CAPS Take 5,000 capsules by mouth daily.   docusate sodium (COLACE) 100 MG capsule Take 100 mg by mouth 2 (two) times daily.   dorzolamide-timolol (COSOPT) 22.3-6.8 MG/ML ophthalmic solution Place 1 drop into both eyes 2 (two) times daily.   famotidine (PEPCID) 20 MG tablet TAKE 1 TABLET BY MOUTH EVERY DAY   Fexofenadine HCl (MUCINEX ALLERGY PO) Take 1 tablet by mouth daily.   finasteride (PROSCAR) 5 MG tablet Take 5 mg by mouth daily.   fluticasone (FLONASE) 50 MCG/ACT nasal spray INHALE 2 SPRAYS INTO EACH NOSTRIL EVERY NIGHT   latanoprost (XALATAN) 0.005 % ophthalmic solution Place 1 drop into both eyes at bedtime.   sildenafil (REVATIO) 20 MG tablet Take 20 mg by mouth 3 (three) times daily.   tamsulosin (FLOMAX) 0.4 MG CAPS capsule Take 0.4 mg by mouth.   traMADol (ULTRAM) 50 MG tablet Take 1 tablet (50 mg total) by mouth every 6 (six) hours  as needed.   valsartan-hydrochlorothiazide (DIOVAN-HCT) 320-25 MG tablet Take 1 tablet by mouth daily.     Allergies:   Other and Sucralfate   Social History   Socioeconomic History   Marital status: Married    Spouse name: Not on file   Number of children: Not on file   Years of education: Not on file   Highest education level: Not on file  Occupational History   Occupation: retired  Tobacco Use   Smoking status: Never   Smokeless tobacco: Never  Vaping Use   Vaping Use: Never used  Substance and Sexual Activity   Alcohol use: Not Currently   Drug use: No   Sexual activity: Yes  Other Topics Concern   Not on file  Social History Narrative   Not on file   Social Determinants of Health   Financial  Resource Strain: Low Risk  (11/20/2021)   Overall Financial Resource Strain (CARDIA)    Difficulty of Paying Living Expenses: Not hard at all  Food Insecurity: No Food Insecurity (11/20/2021)   Hunger Vital Sign    Worried About Running Out of Food in the Last Year: Never true    [place] in the Last Year: Never true  Transportation Needs: No Transportation Needs (11/20/2021)   PRAPARE - Hydrologist (Medical): No    Lack of Transportation (Non-Medical): No  Physical Activity: Inactive (11/20/2021)   Exercise Vital Sign    Days of Exercise per Week: 0 days    Minutes of Exercise per Session: 0 min  Stress: No Stress Concern Present (11/20/2021)   [place]    Feeling of Stress : Not at all  Social Connections: Not on file     Family History: The patient's family history includes Cancer in his mother; Hypertension in his mother. There is no history of Breast cancer, Prostate cancer, Colon cancer, or Pancreatic cancer.    ROS:   Please see the history of present illness.    All other systems reviewed and are negative.   EKGs/Labs/Other Studies Reviewed:    The following studies were reviewed today:  CT angio chest 2018: IMPRESSION: No acute pulmonary embolism.   Mild cardiomegaly. Mild bronchial wall thickening associated with bronchitis or reactive airway disease without pneumonia.   Myocardial perfusion imaging February 2015:  IMPRESSION:  1. No reversible ischemia or infarction.   2.  Normal wall motion.   3. Ejection fraction equals 61 % Aortic Atherosclerosis (ICD10-I70.0).     2D Doppler echocardiogram 03/2018: Study Conclusions   - Left ventricle: The cavity size was normal. There was mild    concentric hypertrophy. Systolic function was normal. The    estimated ejection fraction was in the range of 60% to 65%. Wall    motion was normal; there were no regional  wall motion    abnormalities. Features are consistent with a pseudonormal left    ventricular filling pattern, with concomitant abnormal relaxation    and increased filling pressure (grade 2 diastolic dysfunction).    Doppler parameters are consistent with high ventricular filling    pressure (E/e&' 25). L wave present on mitral inflow, and systolic    blunting in pulmonary vein flow.  - Aortic valve: Sclerosis without stenosis. Transvalvular velocity    was within the normal range. There was no stenosis. There was    trivial regurgitation.  - Mitral valve: Transvalvular velocity was within the normal  range.    There was no evidence for stenosis. There was trivial    regurgitation.  - Left atrium: The atrium was mildly dilated.  - Right ventricle: The cavity size was normal. Wall thickness was    normal. Systolic function was normal. RV systolic pressure (S,    est): 54 mm Hg.  - Right atrium: The atrium was normal in size. Central venous    pressure (est): 3 mm Hg.  - Tricuspid valve: There was mild regurgitation.  - Pulmonic valve: There was mild regurgitation.  - Pulmonary arteries: Systolic pressure was severely increased. PA    peak pressure: 54 mm Hg (S).  - Inferior vena cava: The vessel was normal in size. The    respirophasic diameter changes were in the normal range (= 50%),    consistent with normal central venous pressure.  - Pericardium, extracardiac: A trivial pericardial effusion was    identified posterior to the heart.   Impressions:   - Compared to the exam from 11/10/2016, the aortic regurgitation,    mitral regurgitation, tricuspid regurgitation, and pulmonary    regurgitation all appear to have decreased based on color flow.    RVSP has increased. Side by side comparison of images performed.    [facility]11[facility]2020 Left ventricular hypertrophy Normal systolic function with ejection fraction 55% Mild mitral  regurgitation Mild aortic regurgitation Mild tricuspid regurgitation Normal left atrial size  EKG:  EKG is  ordered today.  The ekg ordered today demonstrates sinus bradycardia  Recent Labs: 07/24/2021: TSH 1.640 01/30/2022: ALT 14; BUN 20; Creatinine 1.20; Hemoglobin 12.1; Platelet Count 170; Potassium 4.0; Sodium 137  Recent Lipid Panel    Component Value Date/Time   CHOL 154 05/02/2020 1732   TRIG 65 05/02/2020 1732   HDL 57 05/02/2020 1732   CHOLHDL 2.7 05/02/2020 1732   LDLCALC 84 05/02/2020 1732     Physical Exam:    VS:  BP (!) 142/78   Pulse (!) 54   Ht '5\' 8"'$  (1.727 m)   Wt 180 lb 12.8 oz (82 kg)   SpO2 99%   BMI 27.49 kg/m     Wt Readings from Last 3 Encounters:  02/03/22 180 lb 12.8 oz (82 kg)  01/30/22 179 lb 3.2 oz (81.3 kg)  12/12/21 184 lb 3.2 oz (83.6 kg)     GEN:  Well nourished, well developed in no acute distress HEENT: Normal NECK: No JVD; No carotid bruits LYMPHATICS: No lymphadenopathy CARDIAC: RRR, no murmurs, rubs, gallops RESPIRATORY:  Clear to auscultation without rales, wheezing or rhonchi  ABDOMEN: Soft, non-tender, non-distended MUSCULOSKELETAL:  No edema; No deformity  SKIN: Warm and dry NEUROLOGIC:  Alert and oriented x 3 PSYCHIATRIC:  Normal affect   ASSESSMENT AND PLAN:    Syncope Feels like heatstroke however has baseline bradycardia and prior history of syncope due to orthostatic.  He is orthostatic negative in clinic today.  No hospital records available for review.  After long discussion patient is agree only for monitor.  Echocardiogram was reassuring last year as above.  No chest pain or shortness of breath. Orthostatic VS for the past 24 hrs:  BP- Lying Pulse- Lying BP- Sitting Pulse- Sitting BP- Standing at 0 minutes Pulse- Standing at 0 minutes  02/03/22 1012 142/80 56 142/78 56 150/82 64    3 minutes standing: 140/84 HR 63  2.  Hypertension -Blood pressure minimally up.  We will continue current medication.  He will  keep log  of his blood pressure.  Medication Adjustments/Labs and Tests Ordered: Current medicines are reviewed at length with the patient today.  Concerns regarding medicines are outlined above.  Orders Placed This Encounter  Procedures   LONG TERM MONITOR (3-14 DAYS)   EKG 12-Lead   No orders of the defined types were placed in this encounter.   Patient Instructions  Medication Instructions:  Your physician recommends that you continue on your current medications as directed. Please refer to the Current Medication list given to you today. *If you need a refill on your cardiac medications before your next appointment, please call your pharmacy*   Lab Work: None ordered If you have labs (blood work) drawn today and your tests are completely normal, you will receive your results only by: [place] (if you have MyChart) OR A paper copy in the mail If you have any lab test that is abnormal or we need to change your treatment, we will call you to review the results.   Testing/Procedures: [place]- Long Term Monitor Instructions  Your physician has requested you wear a ZIO patch monitor for 14 days.  This is a single patch monitor. [employer] supplies one patch monitor per enrollment. Additional stickers are not available. Please do not apply patch if you will be having a Nuclear Stress Test,  Echocardiogram, Cardiac CT, MRI, or Chest Xray during the period you would be wearing the  monitor. The patch cannot be worn during these tests. You cannot remove and re-apply the  ZIO XT patch monitor.  Your ZIO patch monitor will be mailed 3 day USPS to your address on file. It may take 3-5 days  to receive your monitor after you have been enrolled.  Once you have received your monitor, please review the enclosed instructions. Your monitor  has already been registered assigning a specific monitor serial # to you.  Billing and Patient Assistance Program Information  We have supplied  [employer] with any of your insurance information on file for billing purposes. [employer] offers a sliding scale Patient Assistance Program for patients that do not have  insurance, or whose insurance does not completely cover the cost of the ZIO monitor.  You must apply for the Patient Assistance Program to qualify for this discounted rate.  To apply, please call [employer] at [phone], select option 4, select option 2, ask to apply for  Patient Assistance Program. Theodore Demark will ask your household income, and how many people  are in your household. They will quote your out-of-pocket cost based on that information.  [employer] will also be able to set up a 81-month interest-free payment plan if needed.  Applying the monitor   Shave hair from upper left chest.  Hold abrader disc by orange tab. Rub abrader in 40 strokes over the upper left chest as  indicated in your monitor instructions.  Clean area with 4 enclosed alcohol pads. Let dry.  Apply patch as indicated in monitor instructions. Patch will be placed under collarbone on left  side of chest with arrow pointing upward.  Rub patch adhesive wings for 2 minutes. Remove white label marked "1". Remove the white  label marked "2". Rub patch adhesive wings for 2 additional minutes.  While looking in a mirror, press and release button in center of patch. A small green light will  flash 3-4 times. This will be your only indicator that the monitor has been turned on.  Do not shower for the first 24 hours. You may shower after the  first 24 hours.  Press the button if you feel a symptom. You will hear a small click. Record Date, Time and  Symptom in the Patient Logbook.  When you are ready to remove the patch, follow instructions on the last 2 pages of Patient  Logbook. Stick patch monitor onto the last page of Patient Logbook.  Place Patient Logbook in the blue and white box. Use locking tab on box and tape box closed  securely. The blue and white box  has prepaid postage on it. Please place it in the mailbox as  soon as possible. Your physician should have your test results approximately 7 days after the  monitor has been mailed back to [facility].  Call [place] at [phone] if you have questions regarding  your ZIO XT patch monitor. Call them immediately if you see an orange light blinking on your  monitor.  If your monitor falls off in less than 4 days, contact our Monitor department at [phone].  If your monitor becomes loose or falls off after 4 days call [employer] at [phone] for  suggestions on securing your monitor    Follow-Up: At [facility], you and your health needs are our priority.  As part of our continuing mission to provide you with exceptional heart care, we have created designated Provider Care Teams.  These Care Teams include your primary Cardiologist (physician) and Advanced Practice Providers (APPs -  Physician Assistants and Nurse Practitioners) who all work together to provide you with the care you need, when you need it.  We recommend signing up for the patient portal called "MyChart".  Sign up information is provided on this After Visit Summary.  MyChart is used to connect with patients for Virtual Visits (Telemedicine).  Patients are able to view lab/test results, encounter notes, upcoming appointments, etc.  Non-urgent messages can be sent to your provider as well.   To learn more about what you can do with MyChart, go to [email or web address].    Your next appointment:   6 month(s)  The format for your next appointment:   In Person  Provider:   Sinclair Grooms, MD     Other Instructions   Important Information About [place]         Jarrett Soho, [place]  02/03/2022 10:45 AM    [place]

## 2022-02-03 NOTE — Patient Instructions (Signed)
Medication Instructions:  Your physician recommends that you continue on your current medications as directed. Please refer to the Current Medication list given to you today. *If you need a refill on your cardiac medications before your next appointment, please call your pharmacy*   Lab Work: None ordered If you have labs (blood work) drawn today and your tests are completely normal, you will receive your results only by: [place] (if you have MyChart) OR A paper copy in the mail If you have any lab test that is abnormal or we need to change your treatment, we will call you to review the results.   Testing/Procedures: [place]- Long Term Monitor Instructions  Your physician has requested you wear a ZIO patch monitor for 14 days.  This is a single patch monitor. [employer] supplies one patch monitor per enrollment. Additional stickers are not available. Please do not apply patch if you will be having a Nuclear Stress Test,  Echocardiogram, Cardiac CT, MRI, or Chest Xray during the period you would be wearing the  monitor. The patch cannot be worn during these tests. You cannot remove and re-apply the  ZIO XT patch monitor.  Your ZIO patch monitor will be mailed 3 day USPS to your address on file. It may take 3-5 days  to receive your monitor after you have been enrolled.  Once you have received your monitor, please review the enclosed instructions. Your monitor  has already been registered assigning a specific monitor serial # to you.  Billing and Patient Assistance Program Information  We have supplied [employer] with any of your insurance information on file for billing purposes. [employer] offers a sliding scale Patient Assistance Program for patients that do not have  insurance, or whose insurance does not completely cover the cost of the ZIO monitor.  You must apply for the Patient Assistance Program to qualify for this discounted rate.  To apply, please call [employer] at [phone],  select option 4, select option 2, ask to apply for  Patient Assistance Program. Theodore Demark will ask your household income, and how many people  are in your household. They will quote your out-of-pocket cost based on that information.  [employer] will also be able to set up a 106-month interest-free payment plan if needed.  Applying the monitor   Shave hair from upper left chest.  Hold abrader disc by orange tab. Rub abrader in 40 strokes over the upper left chest as  indicated in your monitor instructions.  Clean area with 4 enclosed alcohol pads. Let dry.  Apply patch as indicated in monitor instructions. Patch will be placed under collarbone on left  side of chest with arrow pointing upward.  Rub patch adhesive wings for 2 minutes. Remove white label marked "1". Remove the white  label marked "2". Rub patch adhesive wings for 2 additional minutes.  While looking in a mirror, press and release button in center of patch. A small green light will  flash 3-4 times. This will be your only indicator that the monitor has been turned on.  Do not shower for the first 24 hours. You may shower after the first 24 hours.  Press the button if you feel a symptom. You will hear a small click. Record Date, Time and  Symptom in the Patient Logbook.  When you are ready to remove the patch, follow instructions on the last 2 pages of Patient  Logbook. Stick patch monitor onto the last page of Patient Logbook.  Place Patient Logbook in  the blue and white box. Use locking tab on box and tape box closed  securely. The blue and white box has prepaid postage on it. Please place it in the mailbox as  soon as possible. Your physician should have your test results approximately 7 days after the  monitor has been mailed back to [facility].  Call [place] at [phone] if you have questions regarding  your ZIO XT patch monitor. Call them immediately if you see an orange light blinking on your   monitor.  If your monitor falls off in less than 4 days, contact our Monitor department at [phone].  If your monitor becomes loose or falls off after 4 days call [employer] at [phone] for  suggestions on securing your monitor    Follow-Up: At [facility], you and your health needs are our priority.  As part of our continuing mission to provide you with exceptional heart care, we have created designated Provider Care Teams.  These Care Teams include your primary Cardiologist (physician) and Advanced Practice Providers (APPs -  Physician Assistants and Nurse Practitioners) who all work together to provide you with the care you need, when you need it.  We recommend signing up for the patient portal called "MyChart".  Sign up information is provided on this After Visit Summary.  MyChart is used to connect with patients for Virtual Visits (Telemedicine).  Patients are able to view lab/test results, encounter notes, upcoming appointments, etc.  Non-urgent messages can be sent to your provider as well.   To learn more about what you can do with MyChart, go to [email or web address].    Your next appointment:   6 month(s)  The format for your next appointment:   In Person  Provider:   Sinclair Grooms, MD     Other Instructions   Important Information About Sugar

## 2022-02-04 ENCOUNTER — Telehealth: Payer: Self-pay

## 2022-02-04 ENCOUNTER — Other Ambulatory Visit: Payer: Self-pay | Admitting: Nurse Practitioner

## 2022-02-04 DIAGNOSIS — D472 Monoclonal gammopathy: Secondary | ICD-10-CM

## 2022-02-04 NOTE — Telephone Encounter (Signed)
Patient gave verbal understanding and had no further questions or concerns  

## 2022-02-04 NOTE — Telephone Encounter (Signed)
-----   Message from Owens Shark, NP sent at 02/04/2022  7:23 AM EDT ----- Please let him know M-spike, and IgG slightly higher over past few years, no lab evidene of progression to MM, repeat labs 6 months -- I will place lab orders

## 2022-02-05 ENCOUNTER — Telehealth: Payer: Self-pay

## 2022-02-05 NOTE — Telephone Encounter (Signed)
Mailed out information on [facility].

## 2022-02-08 DIAGNOSIS — R55 Syncope and collapse: Secondary | ICD-10-CM | POA: Diagnosis not present

## 2022-02-10 MED ORDER — ZOSTER VAC RECOMB ADJUVANTED 50 MCG/0.5ML IM SUSR: 0.5000 mL | Freq: Once | INTRAMUSCULAR | 1 refills | Status: AC

## 2022-02-11 DIAGNOSIS — M1711 Unilateral primary osteoarthritis, right knee: Secondary | ICD-10-CM | POA: Diagnosis not present

## 2022-02-11 DIAGNOSIS — M1712 Unilateral primary osteoarthritis, left knee: Secondary | ICD-10-CM | POA: Diagnosis not present

## 2022-02-11 DIAGNOSIS — M170 Bilateral primary osteoarthritis of knee: Secondary | ICD-10-CM | POA: Diagnosis not present

## 2022-03-02 DIAGNOSIS — M1712 Unilateral primary osteoarthritis, left knee: Secondary | ICD-10-CM | POA: Diagnosis not present

## 2022-03-02 DIAGNOSIS — M1711 Unilateral primary osteoarthritis, right knee: Secondary | ICD-10-CM | POA: Diagnosis not present

## 2022-03-02 DIAGNOSIS — M170 Bilateral primary osteoarthritis of knee: Secondary | ICD-10-CM | POA: Diagnosis not present

## 2022-03-03 DIAGNOSIS — R55 Syncope and collapse: Secondary | ICD-10-CM | POA: Diagnosis not present

## 2022-03-05 DIAGNOSIS — C61 Malignant neoplasm of prostate: Secondary | ICD-10-CM | POA: Diagnosis not present

## 2022-03-09 ENCOUNTER — Other Ambulatory Visit: Payer: Self-pay | Admitting: Internal Medicine

## 2022-03-10 DIAGNOSIS — H04123 Dry eye syndrome of bilateral lacrimal glands: Secondary | ICD-10-CM | POA: Diagnosis not present

## 2022-03-10 DIAGNOSIS — H2511 Age-related nuclear cataract, right eye: Secondary | ICD-10-CM | POA: Diagnosis not present

## 2022-03-10 DIAGNOSIS — H401122 Primary open-angle glaucoma, left eye, moderate stage: Secondary | ICD-10-CM | POA: Diagnosis not present

## 2022-03-10 DIAGNOSIS — H401113 Primary open-angle glaucoma, right eye, severe stage: Secondary | ICD-10-CM | POA: Diagnosis not present

## 2022-03-10 DIAGNOSIS — H26492 Other secondary cataract, left eye: Secondary | ICD-10-CM | POA: Diagnosis not present

## 2022-03-10 DIAGNOSIS — Z961 Presence of intraocular lens: Secondary | ICD-10-CM | POA: Diagnosis not present

## 2022-03-12 DIAGNOSIS — E785 Hyperlipidemia, unspecified: Secondary | ICD-10-CM | POA: Diagnosis not present

## 2022-03-12 DIAGNOSIS — Z8509 Personal history of malignant neoplasm of other digestive organs: Secondary | ICD-10-CM | POA: Diagnosis not present

## 2022-03-12 DIAGNOSIS — C61 Malignant neoplasm of prostate: Secondary | ICD-10-CM | POA: Diagnosis not present

## 2022-03-12 DIAGNOSIS — R972 Elevated prostate specific antigen [PSA]: Secondary | ICD-10-CM | POA: Diagnosis not present

## 2022-03-12 DIAGNOSIS — H409 Unspecified glaucoma: Secondary | ICD-10-CM | POA: Diagnosis not present

## 2022-03-12 DIAGNOSIS — I700 Atherosclerosis of aorta: Secondary | ICD-10-CM | POA: Diagnosis not present

## 2022-03-12 DIAGNOSIS — R2681 Unsteadiness on feet: Secondary | ICD-10-CM | POA: Diagnosis not present

## 2022-03-12 DIAGNOSIS — E663 Overweight: Secondary | ICD-10-CM | POA: Diagnosis not present

## 2022-03-12 DIAGNOSIS — N5201 Erectile dysfunction due to arterial insufficiency: Secondary | ICD-10-CM | POA: Diagnosis not present

## 2022-03-12 DIAGNOSIS — N182 Chronic kidney disease, stage 2 (mild): Secondary | ICD-10-CM | POA: Diagnosis not present

## 2022-03-12 DIAGNOSIS — M1990 Unspecified osteoarthritis, unspecified site: Secondary | ICD-10-CM | POA: Diagnosis not present

## 2022-03-12 DIAGNOSIS — Z6827 Body mass index (BMI) 27.0-27.9, adult: Secondary | ICD-10-CM | POA: Diagnosis not present

## 2022-03-12 DIAGNOSIS — I129 Hypertensive chronic kidney disease with stage 1 through stage 4 chronic kidney disease, or unspecified chronic kidney disease: Secondary | ICD-10-CM | POA: Diagnosis not present

## 2022-03-12 DIAGNOSIS — Z008 Encounter for other general examination: Secondary | ICD-10-CM | POA: Diagnosis not present

## 2022-03-12 DIAGNOSIS — Z79891 Long term (current) use of opiate analgesic: Secondary | ICD-10-CM | POA: Diagnosis not present

## 2022-03-12 DIAGNOSIS — N400 Enlarged prostate without lower urinary tract symptoms: Secondary | ICD-10-CM | POA: Diagnosis not present

## 2022-03-12 DIAGNOSIS — K219 Gastro-esophageal reflux disease without esophagitis: Secondary | ICD-10-CM | POA: Diagnosis not present

## 2022-03-12 DIAGNOSIS — Z8673 Personal history of transient ischemic attack (TIA), and cerebral infarction without residual deficits: Secondary | ICD-10-CM | POA: Diagnosis not present

## 2022-03-13 ENCOUNTER — Telehealth: Payer: Self-pay | Admitting: Interventional Cardiology

## 2022-03-13 DIAGNOSIS — R9431 Abnormal electrocardiogram [ECG] [EKG]: Secondary | ICD-10-CM

## 2022-03-13 DIAGNOSIS — R55 Syncope and collapse: Secondary | ICD-10-CM

## 2022-03-13 MED ORDER — APIXABAN 5 MG PO TABS: 5.0000 mg | ORAL_TABLET | Freq: Two times a day (BID) | ORAL | 11 refills | Status: DC

## 2022-03-13 NOTE — Telephone Encounter (Signed)
Per Dr. Tamala Julian, Let the patient know okay to anticoagulate and should have EP consult to help manage.   Results of monitor-   Basic rhythm is NSR with 1 degree AVB average 66 bpm   16 non sustained VT episodes , longest 7 beats at 152 bpm   Atrial fib ( burden 2%) with ave HR 67 bpm. Longest episode ~ 2 hours   PVC burden 3.7%   One PVC episode associated with triggered event.   Patch Wear Time:  13 days and 21 hours (2023-09-17T13:12:26-0400 to 2023-10-01T11:10:54-0400)   Patient had a min HR of 29 bpm, max HR of 152 bpm, and avg HR of 66 bpm. Predominant underlying rhythm was Sinus Rhythm. First Degree AV Block was present. QRS morphology changes were present throughout recording. 16 Ventricular Tachycardia runs  occurred, the run with the fastest interval lasting 6 beats with a max rate of 152 bpm, the longest lasting 7 beats with an avg rate of 102 bpm. Atrial Fibrillation occurred (2% burden), ranging from 44-144 bpm (avg of 67 bpm), the longest lasting 1 hour  58 mins with an avg rate of 65 bpm. Idioventricular Rhythm was present. Second Degree AV Block-Mobitz I (Wenckebach) was present. Isolated SVEs were rare (<1.0%), SVE Couplets were rare (<1.0%), and SVE Triplets were rare (<1.0%). Isolated VEs were  occasional (2.4%, 30704), VE Couplets were occasional (1.3%, 8531), and VE Triplets were rare (<1.0%, 946). Ventricular Bigeminy and Trigeminy were present.   Called patient with Dr. Thompson Caul recommendations. Informed patient that a referral to EP would be ordered. Informed patient that we would call him back about any medication updates. Will reach out to DOD, Dr. Johney Frame, to see what anticoagulation medication would patient need to be on, since Dr. Tamala Julian is out of the office.

## 2022-03-13 NOTE — Telephone Encounter (Signed)
Called patient to let him know that, DOD, Dr. Johney Frame recommended that he starts apixaban (eliquis) 5 mg by mouth twice daily. Will leave samples, 30 day free trial card, and patient assistance information at the front desk for patient to pick up. Patient verbalized understanding.

## 2022-03-13 NOTE — Telephone Encounter (Signed)
Pt would like a callback regarding Heart Monitor results. Please advise 

## 2022-03-13 NOTE — Telephone Encounter (Signed)
Would start apixaban '5mg'$  BID. Sounds like he has been referred to EP per Dr. Tamala Julian.

## 2022-03-13 NOTE — Addendum Note (Signed)
Addended by: Aris Georgia, Smiley Birr L on: 03/13/2022 11:45 AM   Modules accepted: Orders

## 2022-03-23 ENCOUNTER — Encounter: Payer: Self-pay | Admitting: Interventional Cardiology

## 2022-03-24 ENCOUNTER — Telehealth: Payer: Self-pay | Admitting: Physician Assistant

## 2022-03-24 NOTE — Telephone Encounter (Signed)
Pt c/o medication issue:  1. Name of Medication: Eliquis and Aspirin  2. How are you currently taking this medication (dosage and times per day)? Eliquis 2 times a day and Aspirin 1 time a day  3. Are you having a reaction (difficulty breathing--STAT)?   4. What is your medication issue? Wants to know if he still need to take Aspirin too?

## 2022-03-24 NOTE — Telephone Encounter (Signed)
Called patient to let him know that sometimes patient's are on both. Informed patient that a message will be sent to Dr. Tamala Julian to see if he can stop aspirin or not. Patient agreed to plan. Will forward to Dr. Tamala Julian for advisement.

## 2022-03-25 NOTE — Telephone Encounter (Signed)
Yes, please stop aspirin.

## 2022-03-26 NOTE — Telephone Encounter (Signed)
Called patient and told him to stop his aspirin. Patient verbalized understanding. Updated patient's medication list.

## 2022-03-27 NOTE — Telephone Encounter (Signed)
Patient notified to stop ASA on 03/26/22, see phone note from 03/24/22.

## 2022-04-13 ENCOUNTER — Telehealth: Payer: Self-pay | Admitting: *Deleted

## 2022-04-13 ENCOUNTER — Encounter: Payer: Self-pay | Admitting: Cardiovascular Disease

## 2022-04-13 ENCOUNTER — Ambulatory Visit
Payer: No Typology Code available for payment source | Attending: Cardiovascular Disease | Admitting: Cardiovascular Disease

## 2022-04-13 VITALS — BP 136/82 | HR 64 | Ht 68.0 in | Wt 185.6 lb

## 2022-04-13 DIAGNOSIS — R55 Syncope and collapse: Secondary | ICD-10-CM

## 2022-04-13 DIAGNOSIS — I480 Paroxysmal atrial fibrillation: Secondary | ICD-10-CM | POA: Diagnosis not present

## 2022-04-13 NOTE — Patient Instructions (Signed)
Medication Instructions:  Your physician recommends that you continue on your current medications as directed. Please refer to the Current Medication list given to you today.  *If you need a refill on your cardiac medications before your next appointment, please call your pharmacy*   Testing/Procedures: Your physician has recommended that you have a sleep study. This test records several body functions during sleep, including: brain activity, eye movement, oxygen and carbon dioxide blood levels, heart rate and rhythm, breathing rate and rhythm, the flow of air through your mouth and nose, snoring, body muscle movements, and chest and belly movement.  Follow-Up: At [facility], you and your health needs are our priority.  As part of our continuing mission to provide you with exceptional heart care, we have created designated Provider Care Teams.  These Care Teams include your primary Cardiologist (physician) and Advanced Practice Providers (APPs -  Physician Assistants and Nurse Practitioners) who all work together to provide you with the care you need, when you need it.  Your next appointment:   2 month(s)  The format for your next appointment:   In Person  Provider:   You may see Melida Quitter, MD or one of the following Advanced Practice Providers on your designated Care Team:   Tommye Standard, [place] Legrand Como "Jonni Sanger" Chalmers Cater, [place]    Important Information About Sugar

## 2022-04-13 NOTE — Telephone Encounter (Signed)
Pt agreeable to signed waiver and do not open the box until he has been called with the PIN# .

## 2022-04-13 NOTE — Progress Notes (Signed)
Electrophysiology Office Note:    Date:  04/13/2022   ID:  Lawrence Bailey, DOB [birth year], MRN [id number]  PCP:  Glendale Chard, MD   [place] [facility] Cardiologist:  Sinclair Grooms, MD Electrophysiologist:  Melida Quitter, MD     Referring MD: Belva Crome, MD   Chief complaint: passing out  History of Present Illness:    Lawrence Bailey is a 81 y.o. male with a hx of CAD, HTN, orthstasis.  Earlier this past summer, he had an episode of dizziness and loss of consciousness that occurred after he got up from bed and urinated. He has had multiple syncopal episodes, some while seated, some standing. They often occur in hot environments.  He notes occasional palpitation episodes. He denies chest pain, edema.      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   apixaban (ELIQUIS) 5 MG TABS tablet Take 1 tablet (5 mg total) by mouth 2 (two) times daily.   atorvastatin (LIPITOR) 10 MG tablet TAKE 1 TABLET BY MOUTH MONDAY - FRIDAY AT BEDTIME   brimonidine (ALPHAGAN) 0.2 % ophthalmic solution 1 drop 2 (two) times daily.   Cholecalciferol (VITAMIN D3) 125 MCG (5000 UT) CAPS Take 5,000 capsules by mouth daily.   docusate sodium (COLACE) 100 MG capsule Take 100 mg by mouth 2 (two) times daily.   dorzolamide-timolol (COSOPT) 22.3-6.8 MG/ML ophthalmic solution Place 1 drop into both eyes 2 (two) times daily.   famotidine (PEPCID) 20 MG tablet TAKE 1 TABLET BY MOUTH EVERY DAY   Fexofenadine  HCl (MUCINEX ALLERGY PO) Take 1 tablet by mouth daily.   finasteride (PROSCAR) 5 MG tablet Take 5 mg by mouth daily.   fluticasone (FLONASE) 50 MCG/ACT nasal spray INHALE 2 SPRAYS INTO EACH NOSTRIL EVERY NIGHT   latanoprost (XALATAN) 0.005 % ophthalmic solution Place 1 drop into both eyes at bedtime.   pantoprazole (PROTONIX) 40 MG tablet Take 40 mg by mouth daily.   sildenafil (REVATIO) 20 MG tablet Take 20 mg by mouth 3 (three) times daily.   tamsulosin (FLOMAX) 0.4 MG CAPS capsule Take 0.4 mg by mouth.   traMADol (ULTRAM) 50 MG tablet Take 1 tablet (50 mg total) by mouth every 6 (six) hours as needed.   valsartan-hydrochlorothiazide (DIOVAN-HCT) 320-25 MG tablet Take 1 tablet by mouth daily.     Allergies:   Other and Sucralfate   Social History   Socioeconomic History   Marital status: Married    Spouse name: Not on file   Number of children: Not on file   Years of education: Not on file   Highest education level: Not on file  Occupational History   Occupation: retired  Tobacco Use   Smoking status: Never   Smokeless tobacco: Never  Vaping Use   Vaping Use: Never used  Substance and Sexual Activity   Alcohol use: Not Currently  Drug use: No   Sexual activity: Yes  Other Topics Concern   Not on file  Social History Narrative   Not on file   Social Determinants of Health   Financial Resource Strain: Low Risk  (11/20/2021)   Overall Financial Resource Strain (CARDIA)    Difficulty of Paying Living Expenses: Not hard at all  Food Insecurity: No Food Insecurity (11/20/2021)   Hunger Vital Sign    Worried About Running Out of Food in the Last Year: Never true    Ran Out of Food in the Last Year: Never true  Transportation Needs: No Transportation Needs (11/20/2021)   PRAPARE - Hydrologist (Medical): No    Lack of Transportation (Non-Medical): No  Physical Activity: Inactive (11/20/2021)   Exercise Vital Sign    Days of Exercise per Week: 0  days    Minutes of Exercise per Session: 0 min  Stress: No Stress Concern Present (11/20/2021)   [name]    Feeling of Stress : Not at all  Social Connections: Not on file     Family History: The patient's family history includes Cancer in his mother; Hypertension in his mother. There is no history of Breast cancer, Prostate cancer, Colon cancer, or Pancreatic cancer.  ROS:   Please see the history of present illness.    All other systems reviewed and are negative.  EKGs/Labs/Other Studies Reviewed Today:     14 day monitor 2% AF 2.4% PVCs, episodes of NSVT  EKG:  Last EKG results: today - sinus rhythm, left axis deviation, lateral T wave abnormalities    Recent Labs: 07/24/2021: TSH 1.640 01/30/2022: ALT 14; BUN 20; Creatinine 1.20; Hemoglobin 12.1; Platelet Count 170; Potassium 4.0; Sodium 137     Physical Exam:    VS:  BP (!) 146/80   Pulse 64   Ht '5\' 8"'$  (1.727 m)   Wt 185 lb 9.6 oz (84.2 kg)   SpO2 97%   BMI 28.22 kg/m     Wt Readings from Last 3 Encounters:  04/13/22 185 lb 9.6 oz (84.2 kg)  02/03/22 180 lb 12.8 oz (82 kg)  01/30/22 179 lb 3.2 oz (81.3 kg)     GEN: Well nourished, well developed in no acute distress CARDIAC: RRR, no murmurs, rubs, gallops RESPIRATORY:  Normal work of breathing MUSCULOSKELETAL: no edema    ASSESSMENT & PLAN:    Recurrent syncope: likely vagal and orthostatic. I recommended an implantable loop recorder to definitively exclude arrhythmia as a cause since his monitor was quite abnormal. He is noncommittal about having this done. Atrial fibrillation: paroxysmal, rates poorly controlled. Episodes occur predominantly at night, and his wife notes that he snores, so I will order a sleep study today. 3. Secondary hypercoagulable state: continue eliquis       Medication Adjustments/Labs and Tests Ordered: Current medicines are reviewed at length with the patient  today.  Concerns regarding medicines are outlined above.  Orders Placed This Encounter  Procedures   EKG 12-Lead   Itamar Sleep Study   No orders of the defined types were placed in this encounter.    Signed, Melida Quitter, MD  04/13/2022 9:00 AM    [place]

## 2022-04-23 ENCOUNTER — Encounter: Payer: Self-pay | Admitting: Cardiovascular Disease

## 2022-04-23 ENCOUNTER — Telehealth: Payer: Self-pay | Admitting: *Deleted

## 2022-04-23 NOTE — Telephone Encounter (Signed)
Called dental office back 2 extractions.

## 2022-04-23 NOTE — Telephone Encounter (Signed)
   Pre-operative Risk Assessment    Patient Name: Lawrence Bailey  DOB: 09/04/1940 MRN: [id number]   Request for Surgical Clearance    Procedure:  Dental Extraction - Amount of Teeth to be Pulled:      Date of Surgery:  Clearance TBD                                 Surgeon:  Dr. Conrad Cromwell Group or Practice Name:  [facility] Phone number:  [phone] Fax number:  [phone] :1}  Type of Clearance Requested:   - Pharmacy:  Hold Apixaban (Eliquis)     Type of Anesthesia:  Local    Additional requests/questions:      Signed, Greer Ee   04/23/2022, 7:18 AM

## 2022-04-23 NOTE — Telephone Encounter (Signed)
   Patient Name: EMILY FORSE  DOB: 06/20/1940 MRN: [id number]  Primary Cardiologist: Sinclair Grooms, MD  Chart reviewed as part of pre-operative protocol coverage.   Dental extractions of 1-2 teeth are considered low risk procedures per guidelines and generally do not require any specific cardiac clearance. It is also generally accepted that for extractions of 1-2 teeth and dental cleanings, there is no need to interrupt blood thinner therapy.  SBE prophylaxis is not required for the patient from a cardiac standpoint.  I will route this recommendation to the requesting party via Epic fax function and remove from pre-op pool.  Please call with questions.  Mayra Reel, NP 04/23/2022, 11:51 AM

## 2022-04-24 NOTE — Telephone Encounter (Signed)
  Pt's wife calling back to f/u itamar sleep study.

## 2022-04-24 NOTE — Telephone Encounter (Signed)
I will send this call to [facility] Sleep Coordinator to check on if pt has approved for Itamar study. Pt was set up 11 days ago.

## 2022-04-27 NOTE — Telephone Encounter (Signed)
I s/w the pt and informed him that I am waiting to hear from our sleep coordinator if he has been approved for his Itamar sleep study. I informed the pt that I have sent a couple of messages to the sleep coordinator to please check on if pt has been approved. I apologized to the pt for the delay in our service to him and that I will call him just as soon as I find out if he been cleared. Pt thanked me for the call and the help.

## 2022-04-27 NOTE — Telephone Encounter (Signed)
I s/w the pt and his wife and they both are aware of pt ok to proceed with Itamar study and have been given PIN# [id number]. Called and made the patient aware that he may proceed with the [facility] Sleep Study. PIN # provided to the patient. Patient made aware that he will be contacted after the test has been read with the results and any recommendations. Patient verbalized understanding and thanked me for the call.    Pt's wife asked me also about the dental clearance. I reviewed the chart and assured the pt and his wife the pt has been cleared, he will not need SBE nor does he need to hold his blood thinner. Pt and his wife thanked me for the call and the help today.

## 2022-04-27 NOTE — Telephone Encounter (Signed)
Prior Authorization for [facility] sent to [employer] via Phone.  READY TO SCHED- NO PA REQ--REF#[id number],N-04/27/22 3:37.

## 2022-04-29 NOTE — Telephone Encounter (Signed)
Prior Authorization for [facility] sent to [place] via web portal. Tracking Number do not require Pre-Authorization by [employer].

## 2022-05-08 ENCOUNTER — Encounter: Payer: Self-pay | Admitting: Internal Medicine

## 2022-05-08 ENCOUNTER — Other Ambulatory Visit: Payer: Self-pay

## 2022-05-08 MED ORDER — FLUTICASONE PROPIONATE 50 MCG/ACT NA SUSP: NASAL | 5 refills | Status: DC

## 2022-06-10 NOTE — Progress Notes (Signed)
PCP:  Glendale Chard, MD Primary Cardiologist: Sinclair Grooms, MD Electrophysiologist: Melida Quitter, MD   Lawrence Bailey is a 82 y.o. male seen today for Melida Quitter, MD for routine electrophysiology followup. Since last being seen in our clinic the patient reports doing well overall. He has had rare, but not regular episodes of dizziness. He has not yet completed the sleep study. Since last visit, he lost both his 1st wife and eldest daughter on the same day. No further syncope.  Continues to consider loop.  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   gleevac comp 04/2008   Glaucoma    Hypertension    Prostate cancer ([place])    Renal insufficiency     Current Outpatient Medications  Medication Instructions   amLODipine (NORVASC) 10 mg, Oral, Daily   apixaban (ELIQUIS) 5 mg, Oral, 2 times daily   atorvastatin (LIPITOR) 10 MG tablet TAKE 1 TABLET BY MOUTH MONDAY - FRIDAY AT BEDTIME   brimonidine (ALPHAGAN) 0.2 % ophthalmic solution 1 drop, 2 times daily   Cholecalciferol (VITAMIN D3) 125 MCG (5000 UT) CAPS 5,000 capsules, Oral, Daily   docusate sodium (COLACE) 100 mg, Oral, 2 times daily   dorzolamide-timolol (COSOPT) 22.3-6.8 MG/ML ophthalmic solution 1 drop, Both Eyes, 2 times daily   famotidine (PEPCID) 20 mg, Oral, Daily   Fexofenadine HCl (MUCINEX ALLERGY PO) 1 tablet, Oral, Daily   finasteride (PROSCAR) 5 mg, Oral, Daily   fluticasone (FLONASE) 50 MCG/ACT nasal spray INHALE 2 SPRAYS INTO EACH NOSTRIL EVERY NIGHT   latanoprost (XALATAN) 0.005 % ophthalmic solution 1 drop, Daily at bedtime   pantoprazole (PROTONIX) 40 mg, Oral, Daily   sildenafil (REVATIO) 20 mg, Oral, 3 times daily   tamsulosin (FLOMAX) 0.4 mg, Oral   traMADol (ULTRAM) 50 mg, Oral, Every 6 hours PRN   valsartan-hydrochlorothiazide (DIOVAN-HCT) 320-25 MG tablet 1  tablet, Oral, Daily    Physical Exam: Vitals:   06/15/22 0908  BP: 138/86  Pulse: 65  SpO2: 100%  Weight: 189 lb (85.7 kg)  Height: '5\' 8"'$  (1.727 m)    GEN- NAD. A&O x 3. Normal affect. HEENT: normocephalic, atraumatic Lungs- CTAB, Normal effort Heart- Regular rate and rhythm, No M/G/R Extremities- No peripheral edema. no clubbing or cyanosis; Skin- warm and dry, no rash or lesion  EKG is ordered today. Personal review shows NSR at 65 bpm  Additional studies reviewed include: Previous EP notes.   LONG TERM MONITOR (8-14 DAYS) INTERPRETATION 03/03/2022   Basic rhythm is NSR with 1 degree AVB average 66 bpm   16 non sustained VT episodes , longest 7 beats at 152 bpm   Atrial fib ( burden 2%) with ave HR 67 bpm. Longest episode ~ 2 hours   PVC burden 3.7%   One PVC episode associated with triggered event.  Assessment and Plan:  1. Recurrent syncope Likely vagal vs orthostatic by history We have recommended loop recorder to exclude arrhyhtmia. He is more interested and we discussed in detail today, but still not ready to commit.  Echo with LVEF 55% 09/2020  2. Paroxysmal AF Continue eliquis for CHA2DS2/VASc of at least 7. Sleep study ordered but has not been done.    Follow up with Dr. Myles Gip in 3 months as he is considering proceeding with loop. If he continues to defer, would stretch out f/u timing from  there.   Shirley Friar, PA-C  06/15/22 9:22 AM

## 2022-06-15 ENCOUNTER — Ambulatory Visit: Payer: No Typology Code available for payment source | Attending: Student | Admitting: Student

## 2022-06-15 ENCOUNTER — Encounter: Payer: Self-pay | Admitting: Student

## 2022-06-15 VITALS — BP 138/86 | HR 65 | Ht 68.0 in | Wt 189.0 lb

## 2022-06-15 DIAGNOSIS — I480 Paroxysmal atrial fibrillation: Secondary | ICD-10-CM

## 2022-06-15 DIAGNOSIS — R55 Syncope and collapse: Secondary | ICD-10-CM

## 2022-06-15 NOTE — Patient Instructions (Signed)
Medication Instructions:  Your physician recommends that you continue on your current medications as directed. Please refer to the Current Medication list given to you today.  *If you need a refill on your cardiac medications before your next appointment, please call your pharmacy*   Lab Work: None  If you have labs (blood work) drawn today and your tests are completely normal, you will receive your results only by: [place] (if you have MyChart) OR A paper copy in the mail If you have any lab test that is abnormal or we need to change your treatment, we will call you to review the results.   Follow-Up: At [facility], you and your health needs are our priority.  As part of our continuing mission to provide you with exceptional heart care, we have created designated Provider Care Teams.  These Care Teams include your primary Cardiologist (physician) and Advanced Practice Providers (APPs -  Physician Assistants and Nurse Practitioners) who all work together to provide you with the care you need, when you need it.   Your next appointment:   3 month(s)  Provider:   Doralee Albino, MD

## 2022-06-29 ENCOUNTER — Other Ambulatory Visit: Payer: Self-pay | Admitting: Internal Medicine

## 2022-07-07 ENCOUNTER — Inpatient Hospital Stay: Payer: No Typology Code available for payment source | Attending: Oncology

## 2022-07-07 DIAGNOSIS — D472 Monoclonal gammopathy: Secondary | ICD-10-CM | POA: Diagnosis not present

## 2022-07-07 DIAGNOSIS — I480 Paroxysmal atrial fibrillation: Secondary | ICD-10-CM

## 2022-07-07 DIAGNOSIS — Z8509 Personal history of malignant neoplasm of other digestive organs: Secondary | ICD-10-CM | POA: Diagnosis not present

## 2022-07-07 DIAGNOSIS — R55 Syncope and collapse: Secondary | ICD-10-CM

## 2022-07-08 LAB — KAPPA/LAMBDA LIGHT CHAINS
Kappa free light chain: 33.6 mg/L — ABNORMAL HIGH (ref 3.3–19.4)
Kappa, lambda light chain ratio: 1.37 (ref 0.26–1.65)
Lambda free light chains: 24.5 mg/L (ref 5.7–26.3)

## 2022-07-08 LAB — IGG: IgG (Immunoglobin G), Serum: 3700 mg/dL — ABNORMAL HIGH (ref 603–1613)

## 2022-07-10 LAB — PROTEIN ELECTROPHORESIS, SERUM
A/G Ratio: 0.9 (ref 0.7–1.7)
Albumin ELP: 3.9 g/dL (ref 2.9–4.4)
Alpha-1-Globulin: 0.2 g/dL (ref 0.0–0.4)
Alpha-2-Globulin: 0.5 g/dL (ref 0.4–1.0)
Beta Globulin: 0.7 g/dL (ref 0.7–1.3)
Gamma Globulin: 2.9 g/dL — ABNORMAL HIGH (ref 0.4–1.8)
Globulin, Total: 4.3 g/dL — ABNORMAL HIGH (ref 2.2–3.9)
M-Spike, %: 2.4 g/dL — ABNORMAL HIGH
Total Protein ELP: 8.2 g/dL (ref 6.0–8.5)

## 2022-07-15 ENCOUNTER — Telehealth: Payer: Self-pay

## 2022-07-15 NOTE — Telephone Encounter (Signed)
-----   Message from Owens Shark, NP sent at 07/15/2022  8:38 AM EST ----- Please let him know labs are stable.  Follow-up as scheduled.

## 2022-07-15 NOTE — Telephone Encounter (Signed)
Patient gave verbal understanding and had no further questions or concerns  

## 2022-07-22 DIAGNOSIS — Z961 Presence of intraocular lens: Secondary | ICD-10-CM | POA: Diagnosis not present

## 2022-07-22 DIAGNOSIS — H2511 Age-related nuclear cataract, right eye: Secondary | ICD-10-CM | POA: Diagnosis not present

## 2022-07-22 DIAGNOSIS — H401122 Primary open-angle glaucoma, left eye, moderate stage: Secondary | ICD-10-CM | POA: Diagnosis not present

## 2022-07-22 DIAGNOSIS — H401113 Primary open-angle glaucoma, right eye, severe stage: Secondary | ICD-10-CM | POA: Diagnosis not present

## 2022-07-22 DIAGNOSIS — H04123 Dry eye syndrome of bilateral lacrimal glands: Secondary | ICD-10-CM | POA: Diagnosis not present

## 2022-07-27 DIAGNOSIS — M1711 Unilateral primary osteoarthritis, right knee: Secondary | ICD-10-CM | POA: Diagnosis not present

## 2022-07-27 DIAGNOSIS — M170 Bilateral primary osteoarthritis of knee: Secondary | ICD-10-CM | POA: Diagnosis not present

## 2022-07-27 DIAGNOSIS — M1712 Unilateral primary osteoarthritis, left knee: Secondary | ICD-10-CM | POA: Diagnosis not present

## 2022-08-17 ENCOUNTER — Other Ambulatory Visit: Payer: Self-pay | Admitting: Internal Medicine

## 2022-08-31 NOTE — Progress Notes (Unsigned)
Pt cancelled

## 2022-09-10 ENCOUNTER — Encounter: Payer: Self-pay | Admitting: Cardiovascular Disease

## 2022-09-10 ENCOUNTER — Ambulatory Visit
Payer: No Typology Code available for payment source | Attending: Cardiovascular Disease | Admitting: Cardiovascular Disease

## 2022-09-10 VITALS — BP 136/84 | HR 81 | Ht 68.0 in | Wt 186.2 lb

## 2022-09-10 DIAGNOSIS — R55 Syncope and collapse: Secondary | ICD-10-CM

## 2022-09-10 DIAGNOSIS — D6869 Other thrombophilia: Secondary | ICD-10-CM | POA: Diagnosis not present

## 2022-09-10 DIAGNOSIS — R972 Elevated prostate specific antigen [PSA]: Secondary | ICD-10-CM | POA: Diagnosis not present

## 2022-09-10 DIAGNOSIS — I480 Paroxysmal atrial fibrillation: Secondary | ICD-10-CM | POA: Insufficient documentation

## 2022-09-10 NOTE — Progress Notes (Signed)
Electrophysiology Office Note:    Date:  09/10/2022   ID:  Lawrence Bailey, DOB [birth year], MRN [id number]  PCP:  Dorothyann Peng, MD    [facility]Care Providers Cardiologist:  Lesleigh Noe, MD (Inactive) Electrophysiologist:  Maurice Small, MD     Referring MD: Dorothyann Peng, MD   Chief complaint: passing out  History of Present Illness:    Lawrence Bailey is a 82 y.o. male with a hx of CAD, HTN, orthstasis.  Earlier this past summer, he had an episode of dizziness and loss of consciousness that occurred after he got up from bed and urinated. He has had multiple syncopal episodes, some while seated, some standing. They often occur in hot environments.  09/10/2022 Since I last visit with the patient, he saw Otilio Saber.  At that time he continued to have rare episodes of dizziness and occasional palpitations.  He noted that he had recently lost his first wife and eldest daughter on the same day.  He notes occasional palpitation episodes. He denies chest pain, edema.      Past Medical History:  Diagnosis Date   BPH (benign prostatic hyperplasia)    Coronary artery disease    GIST (gastrointestinal stromal tumor), malignant dx'd 04/2007   gleevac comp 04/2008   Glaucoma    Hypertension    Prostate cancer    Renal insufficiency     Past Surgical History:  Procedure Laterality Date   ABDOMINAL SURGERY     EYE SURGERY     HERNIA REPAIR     KNEE SURGERY      Current Medications: Current Meds  Medication Sig   amLODipine (NORVASC) 10 MG tablet Take 10 mg by mouth daily.   apixaban (ELIQUIS) 5 MG TABS tablet Take 1 tablet (5 mg total) by mouth 2 (two) times daily.   atorvastatin (LIPITOR) 10 MG tablet TAKE 1 TABLET BY MOUTH MONDAY - FRIDAY AT BEDTIME   brimonidine (ALPHAGAN) 0.2 % ophthalmic solution 1 drop 2 (two) times daily.   Cholecalciferol (VITAMIN D3) 125 MCG (5000 UT) CAPS Take 5,000 capsules by mouth daily.   docusate sodium (COLACE) 100 MG  capsule Take 100 mg by mouth 2 (two) times daily.   dorzolamide-timolol (COSOPT) 22.3-6.8 MG/ML ophthalmic solution Place 1 drop into both eyes 2 (two) times daily.   famotidine (PEPCID) 20 MG tablet TAKE 1 TABLET BY MOUTH EVERY DAY   Fexofenadine HCl (MUCINEX ALLERGY PO) Take 1 tablet by mouth daily.   finasteride (PROSCAR) 5 MG tablet Take 5 mg by mouth daily.   fluticasone (FLONASE) 50 MCG/ACT nasal spray INHALE 2 SPRAYS INTO EACH NOSTRIL EVERY NIGHT   latanoprost (XALATAN) 0.005 % ophthalmic solution Place 1 drop into both eyes at bedtime.   pantoprazole (PROTONIX) 40 MG tablet TAKE 1 TABLET BY MOUTH EVERY DAY   sildenafil (REVATIO) 20 MG tablet Take 20 mg by mouth 3 (three) times daily.   tamsulosin (FLOMAX) 0.4 MG CAPS capsule Take 0.4 mg by mouth.   traMADol (ULTRAM) 50 MG tablet Take 1 tablet (50 mg total) by mouth every 6 (six) hours as needed.   valsartan-hydrochlorothiazide (DIOVAN-HCT) 320-25 MG tablet Take 1 tablet by mouth daily.     Allergies:   Other and Sucralfate   Social History   Socioeconomic History   Marital status: Married    Spouse name: Not on file   Number of children: Not on file   Years of education: Not on file   Highest education level:  Not on file  Occupational History   Occupation: retired  Tobacco Use   Smoking status: Never   Smokeless tobacco: Never  Vaping Use   Vaping Use: Never used  Substance and Sexual Activity   Alcohol use: Not Currently   Drug use: No   Sexual activity: Yes  Other Topics Concern   Not on file  Social History Narrative   Not on file   Social Determinants of Health   Financial Resource Strain: Low Risk  (11/20/2021)   Overall Financial Resource Strain (CARDIA)    Difficulty of Paying Living Expenses: Not hard at all  Food Insecurity: No Food Insecurity (11/20/2021)   Hunger Vital Sign    Worried About Running Out of Food in the Last Year: Never true    Ran Out of Food in the Last Year: Never true  Transportation  Needs: No Transportation Needs (11/20/2021)   PRAPARE - Administrator, Civil Service (Medical): No    Lack of Transportation (Non-Medical): No  Physical Activity: Inactive (11/20/2021)   Exercise Vital Sign    Days of Exercise per Week: 0 days    Minutes of Exercise per Session: 0 min  Stress: No Stress Concern Present (11/20/2021)   [employer] of Occupational Health - Occupational Stress Questionnaire    Feeling of Stress : Not at all  Social Connections: Not on file     Family History: The patient's family history includes Cancer in his mother; Hypertension in his mother. There is no history of Breast cancer, Prostate cancer, Colon cancer, or Pancreatic cancer.  ROS:   Please see the history of present illness.    All other systems reviewed and are negative.  EKGs/Labs/Other Studies Reviewed Today:     14 day monitor 2% AF 2.4% PVCs, episodes of NSVT  EKG:  Last EKG results: today - sinus rhythm, left axis deviation, lateral T wave abnormalities    Recent Labs: 01/30/2022: ALT 14; BUN 20; Creatinine 1.20; Hemoglobin 12.1; Platelet Count 170; Potassium 4.0; Sodium 137     Physical Exam:    VS:  BP 136/84   Pulse 81   Ht  (1.727 m)   Wt 186 lb 3.2 oz (84.5 kg)   SpO2 97%   BMI 28.31 kg/m     Wt Readings from Last 3 Encounters:  09/10/22 186 lb 3.2 oz (84.5 kg)  06/15/22 189 lb (85.7 kg)  04/13/22 185 lb 9.6 oz (84.2 kg)     GEN: Well nourished, well developed in no acute distress CARDIAC: RRR, no murmurs, rubs, gallops RESPIRATORY:  Normal work of breathing MUSCULOSKELETAL: no edema    ASSESSMENT & PLAN:    Recurrent syncope and collapse:  likely vagal and orthostatic.  I recommended an implantable loop recorder to definitively exclude arrhythmia as a cause since his monitor was quite abnormal, also to manage AF. At this point he is not interested.  Atrial fibrillation:  paroxysmal, rates poorly controlled.  Episodes occur  predominantly at night, and his wife notes that he snores -- sleep study has been ordered. Will order sleep study again  Secondary hypercoagulable state:  continue eliquis        Medication Adjustments/Labs and Tests Ordered: Current medicines are reviewed at length with the patient today.  Concerns regarding medicines are outlined above.  No orders of the defined types were placed in this encounter.  No orders of the defined types were placed in this encounter.    Signed, Roberts Gaudy Nedra Mcinnis,  MD  09/10/2022 9:49 AM    [facility]

## 2022-09-10 NOTE — Patient Instructions (Signed)
Medication Instructions:  Your physician recommends that you continue on your current medications as directed. Please refer to the Current Medication list given to you today. *If you need a refill on your cardiac medications before your next appointment, please call your pharmacy*   Follow-Up: At [facility], you and your health needs are our priority.  As part of our continuing mission to provide you with exceptional heart care, we have created designated Provider Care Teams.  These Care Teams include your primary Cardiologist (physician) and Advanced Practice Providers (APPs -  Physician Assistants and Nurse Practitioners) who all work together to provide you with the care you need, when you need it.  We recommend signing up for the patient portal called "MyChart".  Sign up information is provided on this After Visit Summary.  MyChart is used to connect with patients for Virtual Visits (Telemedicine).  Patients are able to view lab/test results, encounter notes, upcoming appointments, etc.  Non-urgent messages can be sent to your provider as well.   To learn more about what you can do with MyChart, go to [email or web address].    Your next appointment:   As Needed - Should get establish with general cardiology (was seeing Dr Mendel Ryder)  Provider:   York Pellant, MD

## 2022-09-15 DIAGNOSIS — H903 Sensorineural hearing loss, bilateral: Secondary | ICD-10-CM | POA: Diagnosis not present

## 2022-09-15 DIAGNOSIS — H6123 Impacted cerumen, bilateral: Secondary | ICD-10-CM | POA: Diagnosis not present

## 2022-09-29 NOTE — Telephone Encounter (Signed)
I left a very detailed message on the pt's # [phone]. I then called pt's wife and s/w her. I gave her the PIN# [id number] and asked if the pt could do the sleep study by this weekend at the latest. Pt's wife said yes and that he will probably do th study tonight or tomorrow night. Pt's wife thanked me for the call and the help.

## 2022-09-29 NOTE — Telephone Encounter (Signed)
Prior Authorization for [facility] sent to [employer] via web portal. Tracking Number .  READY TO SCHEDULE-NO PA REQ

## 2022-09-29 NOTE — Telephone Encounter (Signed)
Operator sent me a secure chat and said the pt was asking about PIN# for Itamar. I said I will call the pt back.  I called and s/w the pt and his wife about sleep study and PIN#. I stated that I did call on 04/13/22 with a detailed message that he is approved and I left the PIN#, as well as I had sent a my chart message. Pt's wife said the pt had decided back then to not do the sleep study or follow the directions that the pt had been given by the doctor, though he is ready to do sleep study now. I said I am not sure if authorization is still good and that I will have to have sleep coordinator Coralee North check if still authorized. I said I will call them back as soon as I know if still authorized. Pt's wife thanked me for the help.   I have sent a message to Haynes Hoehn. CMA to please review if the pt sleep study is still approved.

## 2022-09-30 DIAGNOSIS — M545 Low back pain, unspecified: Secondary | ICD-10-CM | POA: Diagnosis not present

## 2022-09-30 DIAGNOSIS — M25552 Pain in left hip: Secondary | ICD-10-CM | POA: Diagnosis not present

## 2022-10-07 ENCOUNTER — Telehealth: Payer: Self-pay | Admitting: *Deleted

## 2022-10-07 DIAGNOSIS — I10 Essential (primary) hypertension: Secondary | ICD-10-CM

## 2022-10-07 DIAGNOSIS — I480 Paroxysmal atrial fibrillation: Secondary | ICD-10-CM

## 2022-10-07 NOTE — Telephone Encounter (Addendum)
The patient has been notified of the result and verbalized understanding.  All questions (if any) were answered. [name], CMA 10/07/2022 5:43 PM   Will re-order itamar

## 2022-10-07 NOTE — Telephone Encounter (Signed)
-----   Message from Quintella Reichert, MD sent at 10/05/2022 10:38 AM EDT ----- PAT signal too low - Itamar needs to be repeated

## 2022-10-21 ENCOUNTER — Other Ambulatory Visit: Payer: Self-pay | Admitting: Internal Medicine

## 2022-10-21 MED ORDER — VALSARTAN-HYDROCHLOROTHIAZIDE 320-25 MG PO TABS: 1.0000 | ORAL_TABLET | Freq: Every day | ORAL | 1 refills | Status: DC

## 2022-10-22 NOTE — Telephone Encounter (Signed)
Prior Authorization for [facility] sent to [employer] via web portal. Tracking Number .  READY No PA REQ

## 2022-10-22 NOTE — Telephone Encounter (Signed)
I called the pt in regard to needing a repeat Itamar study. Pt will come by the office this week and pickup new device. Pt will proceed with doing sleep study one night this week or by the weekend. Pt aware same PIN# [id number]. Pt aware that I will place the new device at our front desk to pick up at his convenience. Pt and his wife thanked me for the help.

## 2022-10-28 ENCOUNTER — Encounter (INDEPENDENT_AMBULATORY_CARE_PROVIDER_SITE_OTHER): Payer: No Typology Code available for payment source | Admitting: Cardiology

## 2022-10-28 DIAGNOSIS — G4733 Obstructive sleep apnea (adult) (pediatric): Secondary | ICD-10-CM

## 2022-11-03 ENCOUNTER — Ambulatory Visit: Payer: No Typology Code available for payment source | Attending: Cardiovascular Disease

## 2022-11-03 DIAGNOSIS — I480 Paroxysmal atrial fibrillation: Secondary | ICD-10-CM

## 2022-11-03 DIAGNOSIS — I10 Essential (primary) hypertension: Secondary | ICD-10-CM

## 2022-11-03 NOTE — Procedures (Signed)
SLEEP STUDY REPORT Patient Information Study Date: 10/28/2022 Patient Name: Lawrence Bailey Patient ID: [id number] Birth Date: November 22, 1940 Age: 82 Gender: Male BMI: 28.1 (W=185 lb, H=5' 8'') Stopbang: 5 Referring Physician: York Pellant, MD  TEST DESCRIPTION: Home sleep apnea testing was completed using the WatchPat, a Type 1 device, utilizing peripheral arterial tonometry (PAT), chest movement, actigraphy, pulse oximetry, pulse rate, body position and snore. AHI was calculated with apnea and hypopnea using valid sleep time as the denominator. RDI includes apneas, hypopneas, and RERAs. The data acquired and the scoring of sleep and all associated events were performed in accordance with the recommended standards and specifications as outlined in the AASM Manual for the Scoring of Sleep and Associated Events 2.2.0 (2015).   FINDINGS:   1. Mild Obstructive Sleep Apnea with AHI 9.3/hr.   2. No Central Sleep Apnea with pAHIc 1.4/hr.   3. Oxygen desaturations as low as 87%.   4. Mild snoring was present. O2 sats were < 88% for 0.2 min.   5. Total sleep time was 4 hrs and 57 min.   6. 32.2% of total sleep time was spent in REM sleep.   7. Normal sleep onset latency at 21 min.   8. Shortened REM sleep onset latency at 26 min.   9. Total awakenings were 3.  10. Arrhythmia detection:  Suggestive of possible brief atrial fibrillation lasting 1 min and 28 seconds.  This is not diagnostic and further testing with outpatient telemetry monitoring is recommended.  DIAGNOSIS: Mild Obstructive Sleep Apnea (G47.33) Possible Atrial Fibrillation  RECOMMENDATIONS:   1.  Clinical correlation of these findings is necessary.  The decision to treat obstructive sleep apnea (OSA) is usually based on the presence of apnea symptoms or the presence of associated medical conditions such as Hypertension, Congestive Heart Failure, Atrial Fibrillation or Obesity.  The most common symptoms of OSA are snoring,  gasping for breath while sleeping, daytime sleepiness and fatigue.   2.  Initiating apnea therapy is recommended given the presence of symptoms and/or associated conditions. Recommend proceeding with one of the following:     a.  Auto-CPAP therapy with a pressure range of 5-20cm H2O.     b.  An oral appliance (OA) that can be obtained from certain dentists with expertise in sleep medicine.  These are primarily of use in non-obese patients with mild and moderate disease.     c.  An ENT consultation which may be useful to look for specific causes of obstruction and possible treatment options.     d.  If patient is intolerant to PAP therapy, consider referral to ENT for evaluation for hypoglossal nerve stimulator.   3.  Close follow-up is necessary to ensure success with CPAP or oral appliance therapy for maximum benefit.  4.  A follow-up oximetry study on CPAP is recommended to assess the adequacy of therapy and determine the need for supplemental oxygen or the potential need for Bi-level therapy.  An arterial blood gas to determine the adequacy of baseline ventilation and oxygenation should also be considered.  5.  Healthy sleep recommendations include:  adequate nightly sleep (normal 7-9 hrs/night), avoidance of caffeine after noon and alcohol near bedtime, and maintaining a sleep environment that is cool, dark and quiet.  6.  Weight loss for overweight patients is recommended.  Even modest amounts of weight loss can significantly improve the severity of sleep apnea.  7.  Snoring recommendations include:  weight loss where appropriate, side sleeping, and avoidance  of alcohol before bed.  8.  Operation of motor vehicle should be avoided when sleepy.  Signature: Armanda Magic, MD; [facility]; Diplomat, American Board of Sleep Medicine Electronically Signed: 11/03/2022 10:18:07 AM

## 2022-11-04 DIAGNOSIS — M25552 Pain in left hip: Secondary | ICD-10-CM | POA: Diagnosis not present

## 2022-11-05 ENCOUNTER — Telehealth: Payer: Self-pay | Admitting: *Deleted

## 2022-11-05 ENCOUNTER — Other Ambulatory Visit: Payer: Self-pay | Admitting: Internal Medicine

## 2022-11-05 DIAGNOSIS — G4733 Obstructive sleep apnea (adult) (pediatric): Secondary | ICD-10-CM

## 2022-11-05 DIAGNOSIS — I10 Essential (primary) hypertension: Secondary | ICD-10-CM

## 2022-11-05 NOTE — Telephone Encounter (Signed)
The patient has been notified of the result. Left detailed message on voicemail and informed patient to call back..Bjorn Hallas Green, CMA   

## 2022-11-05 NOTE — Telephone Encounter (Signed)
-----   Message from Quintella Reichert, MD sent at 11/03/2022 10:19 AM EDT ----- Please let patient know that they have sleep apnea and recommend treating with CPAP.  Please order an auto CPAP from 4-15cm H2O with heated humidity and mask of choice.  Order overnight pulse ox on CPAP.  Followup with me in 6 weeks.

## 2022-11-18 ENCOUNTER — Telehealth: Payer: Self-pay | Admitting: Cardiovascular Disease

## 2022-11-18 NOTE — Telephone Encounter (Signed)
Wife is calling to see what the next step after the sleepy study. Please advise

## 2022-11-18 NOTE — Telephone Encounter (Signed)
Spoke with the patient's wife and reviewed results of sleep study per Dr. Mayford Knife:  ----- Message from Quintella Reichert, MD sent at 11/03/2022 10:19 AM EDT ----- Please let patient know that they have sleep apnea and recommend treating with CPAP.  Please order an auto CPAP from 4-15cm H2O with heated humidity and mask of choice.  Order overnight pulse ox on CPAP.  Followup with me in 6 weeks.        Advised that our sleep team would be working on getting his CPAP ordered. Once he receives his machine he will follow up with Dr. Mayford Knife about 6 weeks later.

## 2022-11-24 DIAGNOSIS — H04123 Dry eye syndrome of bilateral lacrimal glands: Secondary | ICD-10-CM | POA: Diagnosis not present

## 2022-11-24 DIAGNOSIS — H2511 Age-related nuclear cataract, right eye: Secondary | ICD-10-CM | POA: Diagnosis not present

## 2022-11-24 DIAGNOSIS — H401113 Primary open-angle glaucoma, right eye, severe stage: Secondary | ICD-10-CM | POA: Diagnosis not present

## 2022-11-24 DIAGNOSIS — H401122 Primary open-angle glaucoma, left eye, moderate stage: Secondary | ICD-10-CM | POA: Diagnosis not present

## 2022-11-24 DIAGNOSIS — Z961 Presence of intraocular lens: Secondary | ICD-10-CM | POA: Diagnosis not present

## 2022-11-24 DIAGNOSIS — R519 Headache, unspecified: Secondary | ICD-10-CM | POA: Diagnosis not present

## 2022-12-02 DIAGNOSIS — M1711 Unilateral primary osteoarthritis, right knee: Secondary | ICD-10-CM | POA: Diagnosis not present

## 2022-12-02 DIAGNOSIS — M1712 Unilateral primary osteoarthritis, left knee: Secondary | ICD-10-CM | POA: Diagnosis not present

## 2022-12-02 DIAGNOSIS — M170 Bilateral primary osteoarthritis of knee: Secondary | ICD-10-CM | POA: Diagnosis not present

## 2022-12-02 NOTE — Addendum Note (Signed)
Addended by: Reesa Chew on: 12/02/2022 05:46 PM   Modules accepted: Orders

## 2022-12-02 NOTE — Telephone Encounter (Signed)
The patient has been notified of the result and verbalized understanding.  All questions (if any) were answered. [name], CMA 12/02/2022 5:28 PM    Upon patient request DME selection is [facility]. Patient understands he will be contacted by [facility] to set up his cpap. Patient understands to call if [facility] does not contact him with new setup in a timely manner. Patient understands they will be called once confirmation has been received from [facility]/ that they have received their new machine to schedule 10 week follow up appointment.   [facility] notified of new cpap order  Please add to airview Patient was grateful for the call and thanked me.

## 2022-12-10 ENCOUNTER — Encounter: Payer: Self-pay | Admitting: Internal Medicine

## 2022-12-10 ENCOUNTER — Ambulatory Visit: Payer: No Typology Code available for payment source | Admitting: Internal Medicine

## 2022-12-10 ENCOUNTER — Ambulatory Visit: Payer: No Typology Code available for payment source

## 2022-12-10 VITALS — BP 118/78 | HR 70 | Temp 98.2°F | Ht 68.0 in | Wt 177.0 lb

## 2022-12-10 VITALS — BP 118/78 | HR 70 | Temp 98.2°F | Ht 68.0 in | Wt 177.6 lb

## 2022-12-10 DIAGNOSIS — I700 Atherosclerosis of aorta: Secondary | ICD-10-CM | POA: Diagnosis not present

## 2022-12-10 DIAGNOSIS — D6869 Other thrombophilia: Secondary | ICD-10-CM

## 2022-12-10 DIAGNOSIS — R519 Headache, unspecified: Secondary | ICD-10-CM | POA: Diagnosis not present

## 2022-12-10 DIAGNOSIS — I480 Paroxysmal atrial fibrillation: Secondary | ICD-10-CM

## 2022-12-10 DIAGNOSIS — I129 Hypertensive chronic kidney disease with stage 1 through stage 4 chronic kidney disease, or unspecified chronic kidney disease: Secondary | ICD-10-CM | POA: Diagnosis not present

## 2022-12-10 DIAGNOSIS — Z0000 Encounter for general adult medical examination without abnormal findings: Secondary | ICD-10-CM | POA: Diagnosis not present

## 2022-12-10 DIAGNOSIS — Z6826 Body mass index (BMI) 26.0-26.9, adult: Secondary | ICD-10-CM

## 2022-12-10 DIAGNOSIS — Z20822 Contact with and (suspected) exposure to covid-19: Secondary | ICD-10-CM | POA: Diagnosis not present

## 2022-12-10 DIAGNOSIS — I1310 Hypertensive heart and chronic kidney disease without heart failure, with stage 1 through stage 4 chronic kidney disease, or unspecified chronic kidney disease: Secondary | ICD-10-CM | POA: Diagnosis not present

## 2022-12-10 DIAGNOSIS — N1831 Chronic kidney disease, stage 3a: Secondary | ICD-10-CM | POA: Diagnosis not present

## 2022-12-10 DIAGNOSIS — D472 Monoclonal gammopathy: Secondary | ICD-10-CM

## 2022-12-10 DIAGNOSIS — E559 Vitamin D deficiency, unspecified: Secondary | ICD-10-CM

## 2022-12-10 NOTE — Patient Instructions (Signed)
Hypertension, Adult Hypertension is another name for high blood pressure. High blood pressure forces your heart to work harder to pump blood. This can cause problems over time. There are two numbers in a blood pressure reading. There is a top number (systolic) over a bottom number (diastolic). It is best to have a blood pressure that is below 120/80. What are the causes? The cause of this condition is not known. Some other conditions can lead to high blood pressure. What increases the risk? Some lifestyle factors can make you more likely to develop high blood pressure: Smoking. Not getting enough exercise or physical activity. Being overweight. Having too much fat, sugar, calories, or salt (sodium) in your diet. Drinking too much alcohol. Other risk factors include: Having any of these conditions: Heart disease. Diabetes. High cholesterol. Kidney disease. Obstructive sleep apnea. Having a family history of high blood pressure and high cholesterol. Age. The risk increases with age. Stress. What are the signs or symptoms? High blood pressure may not cause symptoms. Very high blood pressure (hypertensive crisis) may cause: Headache. Fast or uneven heartbeats (palpitations). Shortness of breath. Nosebleed. Vomiting or feeling like you may vomit (nauseous). Changes in how you see. Very bad chest pain. Feeling dizzy. Seizures. How is this treated? This condition is treated by making healthy lifestyle changes, such as: Eating healthy foods. Exercising more. Drinking less alcohol. Your doctor may prescribe medicine if lifestyle changes do not help enough and if: Your top number is above 130. Your bottom number is above 80. Your personal target blood pressure may vary. Follow these instructions at home: Eating and drinking  If told, follow the DASH eating plan. To follow this plan: Fill one half of your plate at each meal with fruits and vegetables. Fill one fourth of your plate  at each meal with whole grains. Whole grains include whole-wheat pasta, brown rice, and whole-grain bread. Eat or drink low-fat dairy products, such as skim milk or low-fat yogurt. Fill one fourth of your plate at each meal with low-fat (lean) proteins. Low-fat proteins include fish, chicken without skin, eggs, beans, and tofu. Avoid fatty meat, cured and processed meat, or chicken with skin. Avoid pre-made or processed food. Limit the amount of salt in your diet to less than 1,500 mg each day. Do not drink alcohol if: Your doctor tells you not to drink. You are pregnant, may be pregnant, or are planning to become pregnant. If you drink alcohol: Limit how much you have to: 0-1 drink a day for women. 0-2 drinks a day for men. Know how much alcohol is in your drink. In the [place] one drink equals one 12 oz bottle of beer (355 mL), one 5 oz glass of wine (148 mL), or one 1 oz glass of hard liquor (44 mL). Lifestyle  Work with your doctor to stay at a healthy weight or to lose weight. Ask your doctor what the best weight is for you. Get at least 30 minutes of exercise that causes your heart to beat faster (aerobic exercise) most days of the week. This may include walking, swimming, or biking. Get at least 30 minutes of exercise that strengthens your muscles (resistance exercise) at least 3 days a week. This may include lifting weights or doing Pilates. Do not smoke or use any products that contain nicotine or tobacco. If you need help quitting, ask your doctor. Check your blood pressure at home as told by your doctor. Keep all follow-up visits. Medicines Take over-the-counter and prescription medicines   only as told by your doctor. Follow directions carefully. Do not skip doses of blood pressure medicine. The medicine does not work as well if you skip doses. Skipping doses also puts you at risk for problems. Ask your doctor about side effects or reactions to medicines that you should watch  for. Contact a doctor if: You think you are having a reaction to the medicine you are taking. You have headaches that keep coming back. You feel dizzy. You have swelling in your ankles. You have trouble with your vision. Get help right away if: You get a very bad headache. You start to feel mixed up (confused). You feel weak or numb. You feel faint. You have very bad pain in your: Chest. Belly (abdomen). You vomit more than once. You have trouble breathing. These symptoms may be an emergency. Get help right away. Call 911. Do not wait to see if the symptoms will go away. Do not drive yourself to the hospital. Summary Hypertension is another name for high blood pressure. High blood pressure forces your heart to work harder to pump blood. For most people, a normal blood pressure is less than 120/80. Making healthy choices can help lower blood pressure. If your blood pressure does not get lower with healthy choices, you may need to take medicine. This information is not intended to replace advice given to you by your health care provider. Make sure you discuss any questions you have with your health care provider. Document Revised: 02/27/2021 Document Reviewed: 02/27/2021 Elsevier Patient Education  2024 [employer] Inc.  

## 2022-12-10 NOTE — Progress Notes (Signed)
Subjective:   Lawrence Bailey is a 82 y.o. male who presents for Medicare Annual/Subsequent preventive examination.  Visit Complete: In person    Review of Systems     Cardiac Risk Factors include: advanced age (>34men, >50 women);hypertension;male gender     Objective:    Today's Vitals   12/10/22 1421  BP: 118/78  Pulse: 70  Temp: 98.2 F (36.8 C)  TempSrc: Oral  SpO2: 98%  Weight: 177 lb (80.3 kg)  Height: 5\' 8"  (1.727 m)  PainSc: 2    Body mass index is 26.91 kg/m.     12/10/2022    2:28 PM 01/30/2022    1:29 PM 11/20/2021    4:03 PM 11/20/2021    9:18 AM 11/13/2020    2:17 PM 07/25/2020   10:04 AM 11/01/2019   11:59 AM  Advanced Directives  Does Patient Have a Medical Advance Directive? Yes No No No No No No  Type of Estate agent of [place];Living will        Copy of Healthcare Power of Attorney in Chart? No - copy requested        Would patient like information on creating a medical advance directive?  Yes (MAU/Ambulatory/Procedural Areas - Information given) No - Patient declined Yes (MAU/Ambulatory/Procedural Areas - Information given) No - Patient declined Yes (MAU/Ambulatory/Procedural Areas - Information given) Yes (MAU/Ambulatory/Procedural Areas - Information given)    Current Medications (verified) Outpatient Encounter Medications as of 12/10/2022  Medication Sig   amLODipine (NORVASC) 10 MG tablet Take 10 mg by mouth daily.   apixaban (ELIQUIS) 5 MG TABS tablet Take 1 tablet (5 mg total) by mouth 2 (two) times daily.   atorvastatin (LIPITOR) 10 MG tablet TAKE 1 TABLET BY MOUTH MONDAY - FRIDAY AT BEDTIME   brimonidine (ALPHAGAN) 0.2 % ophthalmic solution 1 drop 2 (two) times daily.   Cholecalciferol (VITAMIN D3) 125 MCG (5000 UT) CAPS Take 5,000 capsules by mouth daily.   docusate sodium (COLACE) 100 MG capsule Take 100 mg by mouth 2 (two) times daily.   dorzolamide-timolol (COSOPT) 22.3-6.8 MG/ML ophthalmic solution Place 1 drop  into both eyes 2 (two) times daily.   famotidine (PEPCID) 20 MG tablet TAKE 1 TABLET BY MOUTH EVERY DAY   Fexofenadine HCl (MUCINEX ALLERGY PO) Take 1 tablet by mouth daily.   finasteride (PROSCAR) 5 MG tablet Take 5 mg by mouth daily.   fluticasone (FLONASE) 50 MCG/ACT nasal spray INHALE 2 SPRAYS INTO EACH NOSTRIL EVERY NIGHT   latanoprost (XALATAN) 0.005 % ophthalmic solution Place 1 drop into both eyes at bedtime.   pantoprazole (PROTONIX) 40 MG tablet TAKE 1 TABLET BY MOUTH EVERY DAY   sildenafil (REVATIO) 20 MG tablet Take 20 mg by mouth 3 (three) times daily.   tamsulosin (FLOMAX) 0.4 MG CAPS capsule Take 0.4 mg by mouth.   valsartan-hydrochlorothiazide (DIOVAN-HCT) 320-25 MG tablet Take 1 tablet by mouth daily.   No facility-administered encounter medications on file as of 12/10/2022.    Allergies (verified) Other and Sucralfate   History: Past Medical History:  Diagnosis Date   BPH (benign prostatic hyperplasia)    Coronary artery disease    GIST (gastrointestinal stromal tumor), malignant (HCC) dx'd 04/2007   gleevac comp 04/2008   Glaucoma    Hypertension    Prostate cancer (HCC)    Renal insufficiency    Past Surgical History:  Procedure Laterality Date   ABDOMINAL SURGERY     EYE SURGERY     HERNIA REPAIR  KNEE SURGERY     Family History  Problem Relation Age of Onset   Hypertension Mother    Cancer Mother    Breast cancer Neg Hx    Prostate cancer Neg Hx    Colon cancer Neg Hx    Pancreatic cancer Neg Hx    Social History   Socioeconomic History   Marital status: Married    Spouse name: Not on file   Number of children: Not on file   Years of education: Not on file   Highest education level: Not on file  Occupational History   Occupation: retired  Tobacco Use   Smoking status: Never   Smokeless tobacco: Never  Vaping Use   Vaping status: Never Used  Substance and Sexual Activity   Alcohol use: Not Currently   Drug use: No   Sexual  activity: Yes  Other Topics Concern   Not on file  Social History Narrative   Not on file   Social Determinants of Health   Financial Resource Strain: Low Risk  (12/10/2022)   Overall Financial Resource Strain (CARDIA)    Difficulty of Paying Living Expenses: Not hard at all  Food Insecurity: No Food Insecurity (12/10/2022)   Hunger Vital Sign    Worried About Running Out of Food in the Last Year: Never true    Ran Out of Food in the Last Year: Never true  Transportation Needs: No Transportation Needs (12/10/2022)   PRAPARE - Administrator, Civil Service (Medical): No    Lack of Transportation (Non-Medical): No  Physical Activity: Insufficiently Active (12/10/2022)   Exercise Vital Sign    Days of Exercise per Week: 1 day    Minutes of Exercise per Session: 10 min  Stress: No Stress Concern Present (12/10/2022)   [employer] of Occupational Health - Occupational Stress Questionnaire    Feeling of Stress : Not at all  Social Connections: Moderately Isolated (12/10/2022)   Social Connection and Isolation Panel [NHANES]    Frequency of Communication with Friends and Family: Once a week    Frequency of Social Gatherings with Friends and Family: Three times a week    Attends Religious Services: Never    Active Member of Clubs or Organizations: No    Attends Banker Meetings: Never    Marital Status: Married    Tobacco Counseling Counseling given: Not Answered   Clinical Intake:  Pre-visit preparation completed: Yes  Pain : 0-10 Pain Score: 2  Pain Type: Acute pain Pain Location: Throat (abdomen) Pain Descriptors / Indicators: Sore Pain Onset: In the past 7 days Pain Frequency: Intermittent     Nutritional Status: BMI 25 -29 Overweight Nutritional Risks: None Diabetes: No  How often do you need to have someone help you when you read instructions, pamphlets, or other written materials from your doctor or pharmacy?: 1 - Never  Interpreter  Needed?: No  Information entered by :: NAllen LPN   Activities of Daily Living    12/10/2022    2:23 PM  In your present state of health, do you have any difficulty performing the following activities:  Hearing? 1  Comment slight difficulty  Vision? 0  Difficulty concentrating or making decisions? 0  Walking or climbing stairs? 0  Dressing or bathing? 0  Doing errands, shopping? 0  Preparing Food and eating ? N  Using the Toilet? N  In the past six months, have you accidently leaked urine? Y  Do you have problems with loss  of bowel control? N  Managing your Medications? N  Managing your Finances? N  Housekeeping or managing your Housekeeping? N    Patient Care Team: Dorothyann Peng, MD as PCP - General (Internal Medicine) Lyn Records, MD (Inactive) as PCP - Cardiology (Cardiology) Mealor, Roberts Gaudy, MD as PCP - Electrophysiology (Cardiology) Luretha Murphy, MD as Consulting Physician (General Surgery) Jeani Hawking, MD as Consulting Physician (Gastroenterology) Rinaldo Cloud, MD as Consulting Physician (Cardiology) Sallye Lat, MD as Consulting Physician (Ophthalmology) Harlan Stains, [facility] (Inactive) (Pharmacist) Dorothyann Peng, MD as Referring Physician (Internal Medicine)  Indicate any recent Medical Services you may have received from other than [facility] providers in the past year (date may be approximate).     Assessment:   This is a routine wellness examination for Gleason.  Hearing/Vision screen Hearing Screening - Comments:: Slight hearing issues Vision Screening - Comments:: Regular eye exams, [facility]  Dietary issues and exercise activities discussed:     Goals Addressed             This Visit's Progress    Patient Stated       12/10/2022, to live peaceful       Depression Screen    12/10/2022    2:30 PM 12/10/2022    2:05 PM 11/20/2021    4:04 PM 11/20/2021    3:12 PM 11/13/2020    2:17 PM 11/01/2019   12:01 PM 11/01/2019   11:20  AM  PHQ 2/9 Scores  PHQ - 2 Score 0 0 0 0 0 0 0  PHQ- 9 Score 0 0    0     Fall Risk    12/10/2022    2:29 PM 12/10/2022    2:05 PM 11/20/2021    4:04 PM 11/20/2021    3:12 PM 11/13/2020    2:17 PM  Fall Risk   Falls in the past year? 0 0 0 0 0  Number falls in past yr: 0 0 0 0   Injury with Fall? 0 0 0 0   Risk for fall due to : Medication side effect No Fall Risks Medication side effect No Fall Risks Medication side effect  Follow up Falls prevention discussed;Falls evaluation completed Falls evaluation completed Falls evaluation completed;Education provided;Falls prevention discussed Falls evaluation completed Falls evaluation completed;Education provided;Falls prevention discussed    MEDICARE RISK AT HOME:  Medicare Risk at Home - 12/10/22 1429     Any stairs in or around the home? Yes    If so, are there any without handrails? No    Home free of loose throw rugs in walkways, pet beds, electrical cords, etc? Yes    Adequate lighting in your home to reduce risk of falls? Yes    Life alert? No    Use of a cane, walker or w/c? No    Grab bars in the bathroom? Yes    Shower chair or bench in shower? Yes    Elevated toilet seat or a handicapped toilet? Yes             TIMED UP AND GO:  Was the test performed?  Yes  Length of time to ambulate 10 feet: 5 sec Gait steady and fast without use of assistive device    Cognitive Function:        12/10/2022    2:30 PM 11/20/2021    4:05 PM 11/13/2020    2:19 PM 11/01/2019   12:03 PM 10/25/2018   10:30 AM  6CIT Screen  What Year? 0 points 0 points 0 points 0 points 0 points  What month? 0 points 3 points 0 points 0 points 0 points  What time? 0 points 0 points 0 points 0 points 0 points  Count back from 20 2 points 0 points 0 points 2 points 0 points  Months in reverse 4 points 4 points 4 points 0 points 2 points  Repeat phrase 8 points 4 points 4 points 0 points 0 points  Total Score 14 points 11 points 8 points 2 points 2  points    Immunizations Immunization History  Administered Date(s) Administered   Fluad Quad(high Dose 65+) 03/15/2019, 07/09/2020   PFIZER(Purple Top)SARS-COV-2 Vaccination 07/01/2019, 07/24/2019, 05/24/2020, 09/22/2020   PNEUMOCOCCAL CONJUGATE-20 07/24/2021   Tdap 03/15/2019    TDAP status: Up to date  Flu Vaccine status: Up to date  Pneumococcal vaccine status: Up to date  Covid-19 vaccine status: Information provided on how to obtain vaccines.   Qualifies for Shingles Vaccine? Yes   Zostavax completed No   Shingrix Completed?: No.    Education has been provided regarding the importance of this vaccine. Patient has been advised to call insurance company to determine out of pocket expense if they have not yet received this vaccine. Advised may also receive vaccine at local pharmacy or Health Dept. Verbalized acceptance and understanding.  Screening Tests Health Maintenance  Topic Date Due   COVID-19 Vaccine (5 - 2023-24 season) 01/23/2022   Zoster Vaccines- Shingrix (1 of 2) 03/12/2023 (Originally 03/30/1960)   INFLUENZA VACCINE  12/24/2022   Medicare Annual Wellness (AWV)  12/10/2023   DTaP/Tdap/Td (2 - Td or Tdap) 03/14/2029   Pneumonia Vaccine 87+ Years old  Completed   HPV VACCINES  Aged Out   Hepatitis C Screening  Discontinued    Health Maintenance  Health Maintenance Due  Topic Date Due   COVID-19 Vaccine (5 - 2023-24 season) 01/23/2022    Colorectal cancer screening: No longer required.   Lung Cancer Screening: (Low Dose CT Chest recommended if Age 78-80 years, 20 pack-year currently smoking OR have quit w/in 15years.) does not qualify.   Lung Cancer Screening Referral: no  Additional Screening:  Hepatitis C Screening: does not qualify;   Vision Screening: Recommended annual ophthalmology exams for early detection of glaucoma and other disorders of the eye. Is the patient up to date with their annual eye exam?  Yes  Who is the provider or what is the  name of the office in which the patient attends annual eye exams? [facility] If pt is not established with a provider, would they like to be referred to a provider to establish care? No .   Dental Screening: Recommended annual dental exams for proper oral hygiene  Diabetic Foot Exam: n/a  Community Resource Referral / Chronic Care Management: CRR required this visit?  No   CCM required this visit?  No     Plan:     I have personally reviewed and noted the following in the patient's chart:   Medical and social history Use of alcohol, tobacco or illicit drugs  Current medications and supplements including opioid prescriptions. Patient is not currently taking opioid prescriptions. Functional ability and status Nutritional status Physical activity Advanced directives List of other physicians Hospitalizations, surgeries, and ER visits in previous 12 months Vitals Screenings to include cognitive, depression, and falls Referrals and appointments  In addition, I have reviewed and discussed with patient certain preventive protocols, quality metrics, and best practice recommendations. A  written personalized care plan for preventive services as well as general preventive health recommendations were provided to patient.     Barb Merino, LPN   08/31/8117   After Visit Summary: in person  Nurse Notes: none

## 2022-12-10 NOTE — Progress Notes (Signed)
I,[name], CMA,acting as a Neurosurgeon for [name], MD.,have documented all relevant documentation on the behalf of [name], MD,as directed by  [name], MD while in the presence of [name], MD.  Subjective:  Patient ID: Lawrence Bailey , male    DOB: 1940/06/02 , 82 y.o.   MRN: [id number]  Chief Complaint  Patient presents with   Hypertension    HPI  He presents today for BP check.  He reports compliance with meds. He denies headaches, chest pain and shortness of breath. However, his wife recently tested positive for Covid. He states feeling okay.   AWV completed with [facility] Advisor: [place]  Hypertension This is a chronic problem. The current episode started more than 1 year ago. The problem has been gradually improving since onset. The problem is controlled. Associated symptoms include headaches. Pertinent negatives include no blurred vision or chest pain. Risk factors for coronary artery disease include male gender. The current treatment provides moderate improvement. Hypertensive end-organ damage includes kidney disease.     Past Medical History:  Diagnosis Date   BPH (benign prostatic hyperplasia)    Coronary artery disease    GIST (gastrointestinal stromal tumor), malignant (HCC) dx'd 04/2007   gleevac comp 04/2008   Glaucoma    Hypertension    Prostate cancer (HCC)    Renal insufficiency      Family History  Problem Relation Age of Onset   Hypertension Mother    Cancer Mother    Breast cancer Neg Hx    Prostate cancer Neg Hx    Colon cancer Neg Hx    Pancreatic cancer Neg Hx      Current Outpatient Medications:    amLODipine (NORVASC) 10 MG tablet, Take 10 mg by mouth daily., Disp: , Rfl: 3   apixaban (ELIQUIS) 5 MG TABS tablet, Take 1 tablet (5 mg total) by mouth 2 (two) times daily., Disp: 60 tablet, Rfl: 11   brimonidine (ALPHAGAN) 0.2 % ophthalmic solution, 1 drop 2 (two) times daily., Disp: , Rfl:    Cholecalciferol (VITAMIN  D3) 125 MCG (5000 UT) CAPS, Take 5,000 capsules by mouth daily., Disp: , Rfl:    docusate sodium (COLACE) 100 MG capsule, Take 100 mg by mouth 2 (two) times daily., Disp: , Rfl:    dorzolamide-timolol (COSOPT) 22.3-6.8 MG/ML ophthalmic solution, Place 1 drop into both eyes 2 (two) times daily., Disp: , Rfl: 0   famotidine (PEPCID) 20 MG tablet, TAKE 1 TABLET BY MOUTH EVERY DAY, Disp: 90 tablet, Rfl: 1   Fexofenadine HCl (MUCINEX ALLERGY PO), Take 1 tablet by mouth daily., Disp: , Rfl:    finasteride (PROSCAR) 5 MG tablet, Take 5 mg by mouth daily., Disp: , Rfl:    fluticasone (FLONASE) 50 MCG/ACT nasal spray, INHALE 2 SPRAYS INTO EACH NOSTRIL EVERY NIGHT, Disp: 48 mL, Rfl: 1   latanoprost (XALATAN) 0.005 % ophthalmic solution, Place 1 drop into both eyes at bedtime., Disp: , Rfl:    pantoprazole (PROTONIX) 40 MG tablet, TAKE 1 TABLET BY MOUTH EVERY DAY, Disp: 90 tablet, Rfl: 1   sildenafil (REVATIO) 20 MG tablet, Take 20 mg by mouth 3 (three) times daily., Disp: , Rfl:    tamsulosin (FLOMAX) 0.4 MG CAPS capsule, Take 0.4 mg by mouth., Disp: , Rfl:    valsartan-hydrochlorothiazide (DIOVAN-HCT) 320-25 MG tablet, Take 1 tablet by mouth daily., Disp: 90 tablet, Rfl: 1   atorvastatin (LIPITOR) 10 MG tablet, TAKE 1 TABLET BY MOUTH MONDAY -  FRIDAY AT BEDTIME, Disp: 90 tablet, Rfl: 1   Allergies  Allergen Reactions   Other     Bee sting    Sucralfate Other (See Comments)     Review of Systems  Constitutional:  Positive for appetite change.  HENT: Negative.    Eyes:  Negative for blurred vision.  Respiratory: Negative.    Cardiovascular: Negative.  Negative for chest pain.  Gastrointestinal: Negative.   Genitourinary: Negative.   Skin: Negative.   Allergic/Immunologic: Negative.   Neurological:  Positive for headaches.     Today's Vitals   12/10/22 1404  BP: 118/78  Pulse: 70  Temp: 98.2 F (36.8 C)  SpO2: 98%  Weight: 177 lb 9.6 oz (80.6 kg)  Height: 5\' 8"  (1.727 m)   Body mass  index is 27 kg/m.  Wt Readings from Last 3 Encounters:  12/10/22 177 lb 9.6 oz (80.6 kg)  12/10/22 177 lb (80.3 kg)  09/10/22 186 lb 3.2 oz (84.5 kg)     Objective:  Physical Exam Vitals and nursing note reviewed.  Constitutional:      Appearance: Normal appearance.  HENT:     Head: Normocephalic and atraumatic.  Eyes:     Extraocular Movements: Extraocular movements intact.  Cardiovascular:     Rate and Rhythm: Normal rate and regular rhythm.     Heart sounds: Normal heart sounds.  Pulmonary:     Effort: Pulmonary effort is normal.     Breath sounds: Normal breath sounds.  Musculoskeletal:     Cervical back: Normal range of motion.  Skin:    General: Skin is warm.  Neurological:     General: No focal deficit present.     Mental Status: He is alert.  Psychiatric:        Mood and Affect: Mood normal.         Assessment And Plan:  Hypertensive heart and renal disease with renal failure, stage 1 through stage 4 or unspecified chronic kidney disease, without heart failure Assessment & Plan: Chronic, well controlled.  He will continue with amlodipine 10mg  and valsartan/hct 320/25mg  daily. He is encouraged to follow a low sodium diet. He will f/u in four to six months for re-evaluation.   Orders: -     CMP14+EGFR -     Lipid panel -     CBC  Paroxysmal atrial fibrillation (HCC) Assessment & Plan: Chronic, he is properly anticoagulated and rate controlled. He is encouraged to avoid caffeinated products. He is also encouraged to stay well hydrated.   Orders: -     TSH  Atherosclerosis of aorta (HCC) Assessment & Plan: Chronic, LDL goal < 70.  He will continue with atorvastatin 10mg  M-F. He is encouraged to follow heart healthy lifestyle.   Orders: -     CMP14+EGFR -     Lipid panel  Stage 3a chronic kidney disease (HCC) Assessment & Plan: Chronic, he is encouraged to stay well hydrated, avoid NSAIDs and keep BP controlled to prevent progression of CKD.      Left temporal headache Assessment & Plan: I will check labs below to help r/o GCA. Resu;ts will be faxed to Dr. Dione Booze  Orders: -     C-reactive protein -     Sedimentation rate  Acquired thrombophilia (HCC) Assessment & Plan: Chronic, currently on Eliquis 5mg  twice daily due to underlying PAF.    Monoclonal gammopathy of unknown significance (MGUS) Assessment & Plan: Chronic, Hem/Onc input appreciated. Per Onc, no evidence of progression to MM.  He is followed yearly w/ SPEP and IgG levels.    Close exposure to COVID-19 virus Assessment & Plan: His wife was recently diagnosed with COVID. He is without symptoms. Home COVID test was negative. He is encouraged to let me know if develops symptoms at a later date.    BMI 26.0-26.9,adult Assessment & Plan: He has lost 9lbs since April 2024.  He admits to decreased appetite. He is encouraged to consider Boost/Ensure as meal supplement.       Return for 6 month bpc.  Patient was given opportunity to ask questions. Patient verbalized understanding of the plan and was able to repeat key elements of the plan. All questions were answered to their satisfaction.   I, [name], MD, have reviewed all documentation for this visit. The documentation on 12/22/22 for the exam, diagnosis, procedures, and orders are all accurate and complete.   IF YOU HAVE BEEN REFERRED TO A SPECIALIST, IT MAY TAKE 1-2 WEEKS TO SCHEDULE/PROCESS THE REFERRAL. IF YOU HAVE NOT HEARD FROM US/SPECIALIST IN TWO WEEKS, PLEASE GIVE [place] A CALL AT [phone] X [phone].   THE PATIENT IS ENCOURAGED TO PRACTICE SOCIAL DISTANCING DUE TO THE COVID-19 PANDEMIC.

## 2022-12-11 LAB — CMP14+EGFR
ALT: 12 IU/L (ref 0–44)
AST: 17 IU/L (ref 0–40)
Albumin: 3.9 g/dL (ref 3.7–4.7)
Alkaline Phosphatase: 57 IU/L (ref 44–121)
BUN/Creatinine Ratio: 16 (ref 10–24)
BUN: 24 mg/dL (ref 8–27)
Bilirubin Total: 0.4 mg/dL (ref 0.0–1.2)
CO2: 26 mmol/L (ref 20–29)
Calcium: 9.5 mg/dL (ref 8.6–10.2)
Chloride: 97 mmol/L (ref 96–106)
Creatinine, Ser: 1.46 mg/dL — ABNORMAL HIGH (ref 0.76–1.27)
Globulin, Total: 4.1 g/dL (ref 1.5–4.5)
Glucose: 142 mg/dL — ABNORMAL HIGH (ref 70–99)
Potassium: 4 mmol/L (ref 3.5–5.2)
Sodium: 134 mmol/L (ref 134–144)
Total Protein: 8 g/dL (ref 6.0–8.5)
eGFR: 48 mL/min/{1.73_m2} — ABNORMAL LOW (ref >59)

## 2022-12-11 LAB — CBC
Hematocrit: 33 % — ABNORMAL LOW (ref 37.5–51.0)
Hemoglobin: 10.7 g/dL — ABNORMAL LOW (ref 13.0–17.7)
MCH: 26.9 pg (ref 26.6–33.0)
MCHC: 32.4 g/dL (ref 31.5–35.7)
MCV: 83 fL (ref 79–97)
Platelets: 167 10*3/uL (ref 150–450)
RBC: 3.98 x10E6/uL — ABNORMAL LOW (ref 4.14–5.80)
RDW: 13.7 % (ref 11.6–15.4)
WBC: 5.2 10*3/uL (ref 3.4–10.8)

## 2022-12-11 LAB — TSH: TSH: 1.33 u[IU]/mL (ref 0.450–4.500)

## 2022-12-11 LAB — C-REACTIVE PROTEIN: CRP: <1 mg/L (ref 0–10)

## 2022-12-11 LAB — SEDIMENTATION RATE: Sed Rate: 11 mm/hr (ref 0–30)

## 2022-12-11 LAB — LIPID PANEL
Chol/HDL Ratio: 2.2 ratio (ref 0.0–5.0)
Cholesterol, Total: 117 mg/dL (ref 100–199)
HDL: 54 mg/dL (ref >39)
LDL Chol Calc (NIH): 50 mg/dL (ref 0–99)
Triglycerides: 57 mg/dL (ref 0–149)
VLDL Cholesterol Cal: 13 mg/dL (ref 5–40)

## 2022-12-13 ENCOUNTER — Other Ambulatory Visit: Payer: Self-pay | Admitting: Internal Medicine

## 2022-12-16 DIAGNOSIS — M1711 Unilateral primary osteoarthritis, right knee: Secondary | ICD-10-CM | POA: Diagnosis not present

## 2022-12-16 DIAGNOSIS — M1712 Unilateral primary osteoarthritis, left knee: Secondary | ICD-10-CM | POA: Diagnosis not present

## 2022-12-16 DIAGNOSIS — M170 Bilateral primary osteoarthritis of knee: Secondary | ICD-10-CM | POA: Diagnosis not present

## 2022-12-21 DIAGNOSIS — G4733 Obstructive sleep apnea (adult) (pediatric): Secondary | ICD-10-CM | POA: Diagnosis not present

## 2022-12-22 DIAGNOSIS — R519 Headache, unspecified: Secondary | ICD-10-CM | POA: Insufficient documentation

## 2022-12-22 DIAGNOSIS — Z20822 Contact with and (suspected) exposure to covid-19: Secondary | ICD-10-CM | POA: Insufficient documentation

## 2022-12-22 DIAGNOSIS — Z6826 Body mass index (BMI) 26.0-26.9, adult: Secondary | ICD-10-CM | POA: Insufficient documentation

## 2022-12-22 NOTE — Assessment & Plan Note (Signed)
His wife was recently diagnosed with COVID. He is without symptoms. Home COVID test was negative. He is encouraged to let me know if develops symptoms at a later date.

## 2022-12-22 NOTE — Assessment & Plan Note (Signed)
Chronic, currently on Eliquis 5mg  twice daily due to underlying PAF.

## 2022-12-22 NOTE — Assessment & Plan Note (Signed)
Chronic, LDL goal < 70.  He will continue with atorvastatin 10mg  M-F. He is encouraged to follow heart healthy lifestyle.

## 2022-12-22 NOTE — Assessment & Plan Note (Signed)
Chronic, he is properly anticoagulated and rate controlled. He is encouraged to avoid caffeinated products. He is also encouraged to stay well hydrated.

## 2022-12-22 NOTE — Assessment & Plan Note (Signed)
He has lost 9lbs since April 2024.  He admits to decreased appetite. He is encouraged to consider Boost/Ensure as meal supplement.

## 2022-12-22 NOTE — Assessment & Plan Note (Signed)
Chronic, Hem/Onc input appreciated. Per Onc, no evidence of progression to MM. He is followed yearly w/ SPEP and IgG levels.

## 2022-12-22 NOTE — Assessment & Plan Note (Addendum)
Chronic, well controlled.  He will continue with amlodipine 10mg  and valsartan/hct 320/25mg  daily. He is encouraged to follow a low sodium diet. He will f/u in four to six months for re-evaluation.

## 2022-12-22 NOTE — Assessment & Plan Note (Signed)
Chronic, he is encouraged to stay well hydrated, avoid NSAIDs and keep BP controlled to prevent progression of CKD.

## 2022-12-22 NOTE — Assessment & Plan Note (Signed)
I will check labs below to help r/o GCA. Resu;ts will be faxed to Dr. Dione Booze

## 2022-12-23 DIAGNOSIS — M1711 Unilateral primary osteoarthritis, right knee: Secondary | ICD-10-CM | POA: Diagnosis not present

## 2022-12-23 DIAGNOSIS — M170 Bilateral primary osteoarthritis of knee: Secondary | ICD-10-CM | POA: Diagnosis not present

## 2022-12-23 DIAGNOSIS — M1712 Unilateral primary osteoarthritis, left knee: Secondary | ICD-10-CM | POA: Diagnosis not present

## 2022-12-28 ENCOUNTER — Ambulatory Visit: Payer: No Typology Code available for payment source | Admitting: Internal Medicine

## 2022-12-28 VITALS — BP 110/78 | HR 77 | Temp 98.1°F | Ht 68.0 in | Wt 176.4 lb

## 2022-12-28 DIAGNOSIS — N3001 Acute cystitis with hematuria: Secondary | ICD-10-CM

## 2022-12-28 DIAGNOSIS — R6881 Early satiety: Secondary | ICD-10-CM | POA: Diagnosis not present

## 2022-12-28 DIAGNOSIS — D649 Anemia, unspecified: Secondary | ICD-10-CM

## 2022-12-28 DIAGNOSIS — R310 Gross hematuria: Secondary | ICD-10-CM

## 2022-12-28 DIAGNOSIS — R1084 Generalized abdominal pain: Secondary | ICD-10-CM | POA: Diagnosis not present

## 2022-12-28 LAB — POCT URINALYSIS DIPSTICK
Glucose, UA: NEGATIVE
Ketones, UA: NEGATIVE
Nitrite, UA: NEGATIVE
Protein, UA: POSITIVE — AB
Spec Grav, UA: >=1.03 — AB (ref 1.010–1.025)
Urobilinogen, UA: 1 E.U./dL
pH, UA: 6 (ref 5.0–8.0)

## 2022-12-28 LAB — POC HEMOCCULT BLD/STL (OFFICE/1-CARD/DIAGNOSTIC): Fecal Occult Blood, POC: NEGATIVE

## 2022-12-28 MED ORDER — DOXYCYCLINE HYCLATE 100 MG PO TABS
100.0000 mg | ORAL_TABLET | Freq: Two times a day (BID) | ORAL | 0 refills | Status: DC
Start: 2022-12-28 — End: 2023-02-02

## 2022-12-28 MED ORDER — CEFTRIAXONE SODIUM 500 MG IJ SOLR
500.0000 mg | Freq: Once | INTRAMUSCULAR | Status: AC
Start: 2022-12-28 — End: 2022-12-28
  Administered 2022-12-28: 500 mg via INTRAMUSCULAR

## 2022-12-28 NOTE — Progress Notes (Unsigned)
I,Lawrence Bailey, CMA,acting as a Neurosurgeon for Lawrence Aliment, MD.,have documented all relevant documentation on the behalf of Lawrence Aliment, MD,as directed by  Lawrence Aliment, MD while in the presence of Lawrence Aliment, MD.  Subjective:  Patient ID: Lawrence Bailey , male    DOB: 12/09/1940 , 82 y.o.   MRN: [id number]  Chief Complaint  Patient presents with   GI Problem    HPI  Patient presents today for stomach pain. He reports experiencing pain in my stomach for several months along with pain. He admits not eating full meals.   He adds, also having pain while urinating, he reports this comes out as a " bright red". He reports noticing this yesterday night.      Past Medical History:  Diagnosis Date   BPH (benign prostatic hyperplasia)    Coronary artery disease    GIST (gastrointestinal stromal tumor), malignant (HCC) dx'd 04/2007   gleevac comp 04/2008   Glaucoma    Hypertension    Prostate cancer (HCC)    Renal insufficiency      Family History  Problem Relation Age of Onset   Hypertension Mother    Cancer Mother    Breast cancer Neg Hx    Prostate cancer Neg Hx    Colon cancer Neg Hx    Pancreatic cancer Neg Hx      Current Outpatient Medications:    amLODipine (NORVASC) 10 MG tablet, Take 10 mg by mouth daily., Disp: , Rfl: 3   apixaban (ELIQUIS) 5 MG TABS tablet, Take 1 tablet (5 mg total) by mouth 2 (two) times daily., Disp: 60 tablet, Rfl: 11   atorvastatin (LIPITOR) 10 MG tablet, TAKE 1 TABLET BY MOUTH MONDAY - FRIDAY AT BEDTIME, Disp: 90 tablet, Rfl: 1   brimonidine (ALPHAGAN) 0.2 % ophthalmic solution, 1 drop 2 (two) times daily., Disp: , Rfl:    Cholecalciferol (VITAMIN D3) 125 MCG (5000 UT) CAPS, Take 5,000 capsules by mouth daily., Disp: , Rfl:    docusate sodium (COLACE) 100 MG capsule, Take 100 mg by mouth 2 (two) times daily., Disp: , Rfl:    dorzolamide-timolol (COSOPT) 22.3-6.8 MG/ML ophthalmic solution, Place 1 drop into both eyes 2 (two)  times daily., Disp: , Rfl: 0   famotidine (PEPCID) 20 MG tablet, TAKE 1 TABLET BY MOUTH EVERY DAY, Disp: 90 tablet, Rfl: 1   Fexofenadine HCl (MUCINEX ALLERGY PO), Take 1 tablet by mouth daily., Disp: , Rfl:    finasteride (PROSCAR) 5 MG tablet, Take 5 mg by mouth daily., Disp: , Rfl:    fluticasone (FLONASE) 50 MCG/ACT nasal spray, INHALE 2 SPRAYS INTO EACH NOSTRIL EVERY NIGHT, Disp: 48 mL, Rfl: 1   latanoprost (XALATAN) 0.005 % ophthalmic solution, Place 1 drop into both eyes at bedtime., Disp: , Rfl:    sildenafil (REVATIO) 20 MG tablet, Take 20 mg by mouth 3 (three) times daily., Disp: , Rfl:    tamsulosin (FLOMAX) 0.4 MG CAPS capsule, Take 0.4 mg by mouth., Disp: , Rfl:    valsartan-hydrochlorothiazide (DIOVAN-HCT) 320-25 MG tablet, Take 1 tablet by mouth daily., Disp: 90 tablet, Rfl: 1   pantoprazole (PROTONIX) 40 MG tablet, TAKE 1 TABLET BY MOUTH EVERY DAY, Disp: 90 tablet, Rfl: 1   Allergies  Allergen Reactions   Other     Bee sting    Sucralfate Other (See Comments)     Review of Systems  Constitutional: Negative.   HENT: Negative.    Respiratory: Negative.  Cardiovascular: Negative.   Gastrointestinal:  Positive for abdominal distention, constipation and diarrhea. Negative for blood in stool.  Endocrine: Negative.   Genitourinary:  Positive for dysuria and hematuria.       States he initially saw blood in his urine yesterday.   Skin: Negative.   Allergic/Immunologic: Negative.   Neurological: Negative.   Hematological: Negative.      Today's Vitals   12/28/22 1541  BP: 110/78  Pulse: 77  Temp: 98.1 F (36.7 C)  SpO2: 98%  Weight: 176 lb 6.4 oz (80 kg)  Height: 5\' 8"  (1.727 m)  PainSc: 5    Body mass index is 26.82 kg/m.  Wt Readings from Last 3 Encounters:  12/28/22 176 lb 6.4 oz (80 kg)  12/10/22 177 lb 9.6 oz (80.6 kg)  12/10/22 177 lb (80.3 kg)     Objective:  Physical Exam Vitals and nursing note reviewed. Exam conducted with a chaperone present.   Constitutional:      Appearance: Normal appearance.  HENT:     Head: Normocephalic and atraumatic.  Cardiovascular:     Rate and Rhythm: Normal rate and regular rhythm.     Heart sounds: Normal heart sounds.  Pulmonary:     Effort: Pulmonary effort is normal.     Breath sounds: Normal breath sounds.  Genitourinary:    Rectum: Normal. Guaiac result negative.  Skin:    General: Skin is warm.  Neurological:     General: No focal deficit present.     Mental Status: He is alert.  Psychiatric:        Mood and Affect: Mood normal.         Assessment And Plan:  Generalized abdominal pain  Early satiety -     Ambulatory referral to Gastroenterology  Gross hematuria -     POCT urinalysis dipstick  Anemia, unspecified type -     Iron, TIBC and Ferritin Panel  Acute cystitis with hematuria -     Urine Culture     Return if symptoms worsen or fail to improve.  Patient was given opportunity to ask questions. Patient verbalized understanding of the plan and was able to repeat key elements of the plan. All questions were answered to their satisfaction.    I, Lawrence Aliment, MD, have reviewed all documentation for this visit. The documentation on 12/28/22 for the exam, diagnosis, procedures, and orders are all accurate and complete.   IF YOU HAVE BEEN REFERRED TO A SPECIALIST, IT MAY TAKE 1-2 WEEKS TO SCHEDULE/PROCESS THE REFERRAL. IF YOU HAVE NOT HEARD FROM US/SPECIALIST IN TWO WEEKS, PLEASE GIVE [place] A CALL AT [phone].   THE PATIENT IS ENCOURAGED TO PRACTICE SOCIAL DISTANCING DUE TO THE COVID-19 PANDEMIC.

## 2022-12-28 NOTE — Patient Instructions (Signed)

## 2022-12-30 DIAGNOSIS — R310 Gross hematuria: Secondary | ICD-10-CM | POA: Insufficient documentation

## 2022-12-30 DIAGNOSIS — N3001 Acute cystitis with hematuria: Secondary | ICD-10-CM | POA: Insufficient documentation

## 2022-12-30 DIAGNOSIS — D649 Anemia, unspecified: Secondary | ICD-10-CM | POA: Insufficient documentation

## 2022-12-30 DIAGNOSIS — R6881 Early satiety: Secondary | ICD-10-CM | POA: Insufficient documentation

## 2022-12-30 DIAGNOSIS — R1084 Generalized abdominal pain: Secondary | ICD-10-CM | POA: Insufficient documentation

## 2022-12-30 NOTE — Assessment & Plan Note (Signed)
Previous labs reviewed, will check iron panel today. DRE performed, stool is heme negative.

## 2022-12-30 NOTE — Assessment & Plan Note (Signed)
He agrees to with GI referral, has h/o GIST.

## 2022-12-30 NOTE — Assessment & Plan Note (Addendum)
Urinalysis pos for UTI. He was given Rocephin 500mg  IM x 1 and rx doxycycline to take twice daily. He is encouraged to take full course of abx. He is on  Eliquis, dose likely needs to be reduced due to age/renal function.

## 2022-12-30 NOTE — Assessment & Plan Note (Addendum)
Advised to continue with famotidine daily; however, if sx persist, he will need to switch to PPI therapy. He has been on pantoprazole in the past.  I will refer him to GI for further evaluation. He is aware EGD will likely be a part of his workup.

## 2023-01-02 ENCOUNTER — Encounter: Payer: Self-pay | Admitting: Internal Medicine

## 2023-01-04 ENCOUNTER — Other Ambulatory Visit: Payer: Self-pay

## 2023-01-04 DIAGNOSIS — R6881 Early satiety: Secondary | ICD-10-CM

## 2023-01-18 ENCOUNTER — Other Ambulatory Visit: Payer: Self-pay | Admitting: Internal Medicine

## 2023-01-21 DIAGNOSIS — G4733 Obstructive sleep apnea (adult) (pediatric): Secondary | ICD-10-CM | POA: Diagnosis not present

## 2023-01-29 ENCOUNTER — Other Ambulatory Visit: Payer: Self-pay | Admitting: *Deleted

## 2023-01-29 DIAGNOSIS — D472 Monoclonal gammopathy: Secondary | ICD-10-CM

## 2023-01-31 ENCOUNTER — Other Ambulatory Visit: Payer: Self-pay

## 2023-01-31 ENCOUNTER — Inpatient Hospital Stay (HOSPITAL_COMMUNITY)
Admission: EM | Admit: 2023-01-31 | Discharge: 2023-02-02 | DRG: 690 | Disposition: A | Discharge status: Home / Self Care | Payer: No Typology Code available for payment source | Attending: Internal Medicine | Admitting: Internal Medicine

## 2023-01-31 ENCOUNTER — Encounter (HOSPITAL_COMMUNITY): Payer: Self-pay

## 2023-01-31 ENCOUNTER — Emergency Department (HOSPITAL_COMMUNITY): Payer: No Typology Code available for payment source

## 2023-01-31 DIAGNOSIS — I5020 Unspecified systolic (congestive) heart failure: Secondary | ICD-10-CM

## 2023-01-31 DIAGNOSIS — N200 Calculus of kidney: Secondary | ICD-10-CM | POA: Diagnosis present

## 2023-01-31 DIAGNOSIS — R0602 Shortness of breath: Secondary | ICD-10-CM | POA: Diagnosis not present

## 2023-01-31 DIAGNOSIS — N179 Acute kidney failure, unspecified: Secondary | ICD-10-CM | POA: Diagnosis not present

## 2023-01-31 DIAGNOSIS — D649 Anemia, unspecified: Secondary | ICD-10-CM | POA: Diagnosis not present

## 2023-01-31 DIAGNOSIS — E785 Hyperlipidemia, unspecified: Secondary | ICD-10-CM | POA: Diagnosis present

## 2023-01-31 DIAGNOSIS — Z8249 Family history of ischemic heart disease and other diseases of the circulatory system: Secondary | ICD-10-CM

## 2023-01-31 DIAGNOSIS — D696 Thrombocytopenia, unspecified: Secondary | ICD-10-CM | POA: Diagnosis not present

## 2023-01-31 DIAGNOSIS — H409 Unspecified glaucoma: Secondary | ICD-10-CM | POA: Diagnosis not present

## 2023-01-31 DIAGNOSIS — I10 Essential (primary) hypertension: Secondary | ICD-10-CM | POA: Diagnosis present

## 2023-01-31 DIAGNOSIS — N12 Tubulo-interstitial nephritis, not specified as acute or chronic: Secondary | ICD-10-CM | POA: Diagnosis not present

## 2023-01-31 DIAGNOSIS — I4891 Unspecified atrial fibrillation: Secondary | ICD-10-CM | POA: Diagnosis not present

## 2023-01-31 DIAGNOSIS — N390 Urinary tract infection, site not specified: Secondary | ICD-10-CM | POA: Diagnosis not present

## 2023-01-31 DIAGNOSIS — B9620 Unspecified Escherichia coli [E. coli] as the cause of diseases classified elsewhere: Secondary | ICD-10-CM | POA: Diagnosis not present

## 2023-01-31 DIAGNOSIS — I1310 Hypertensive heart and chronic kidney disease without heart failure, with stage 1 through stage 4 chronic kidney disease, or unspecified chronic kidney disease: Secondary | ICD-10-CM | POA: Diagnosis present

## 2023-01-31 DIAGNOSIS — Z8546 Personal history of malignant neoplasm of prostate: Secondary | ICD-10-CM

## 2023-01-31 DIAGNOSIS — I700 Atherosclerosis of aorta: Secondary | ICD-10-CM | POA: Diagnosis present

## 2023-01-31 DIAGNOSIS — N281 Cyst of kidney, acquired: Secondary | ICD-10-CM | POA: Diagnosis not present

## 2023-01-31 DIAGNOSIS — Z85831 Personal history of malignant neoplasm of soft tissue: Secondary | ICD-10-CM

## 2023-01-31 DIAGNOSIS — Z79899 Other long term (current) drug therapy: Secondary | ICD-10-CM

## 2023-01-31 DIAGNOSIS — Z7901 Long term (current) use of anticoagulants: Secondary | ICD-10-CM

## 2023-01-31 DIAGNOSIS — I480 Paroxysmal atrial fibrillation: Secondary | ICD-10-CM | POA: Diagnosis present

## 2023-01-31 DIAGNOSIS — N183 Chronic kidney disease, stage 3 unspecified: Secondary | ICD-10-CM | POA: Diagnosis present

## 2023-01-31 DIAGNOSIS — I5030 Unspecified diastolic (congestive) heart failure: Secondary | ICD-10-CM | POA: Diagnosis not present

## 2023-01-31 DIAGNOSIS — N400 Enlarged prostate without lower urinary tract symptoms: Secondary | ICD-10-CM | POA: Diagnosis present

## 2023-01-31 DIAGNOSIS — N1831 Chronic kidney disease, stage 3a: Secondary | ICD-10-CM | POA: Diagnosis present

## 2023-01-31 DIAGNOSIS — R739 Hyperglycemia, unspecified: Secondary | ICD-10-CM | POA: Diagnosis present

## 2023-01-31 DIAGNOSIS — E440 Moderate protein-calorie malnutrition: Secondary | ICD-10-CM | POA: Diagnosis present

## 2023-01-31 DIAGNOSIS — Z6826 Body mass index (BMI) 26.0-26.9, adult: Secondary | ICD-10-CM | POA: Diagnosis not present

## 2023-01-31 DIAGNOSIS — E663 Overweight: Secondary | ICD-10-CM | POA: Diagnosis not present

## 2023-01-31 DIAGNOSIS — Z85028 Personal history of other malignant neoplasm of stomach: Secondary | ICD-10-CM

## 2023-01-31 DIAGNOSIS — E222 Syndrome of inappropriate secretion of antidiuretic hormone: Secondary | ICD-10-CM | POA: Diagnosis present

## 2023-01-31 DIAGNOSIS — R531 Weakness: Secondary | ICD-10-CM | POA: Diagnosis not present

## 2023-01-31 DIAGNOSIS — E869 Volume depletion, unspecified: Secondary | ICD-10-CM | POA: Diagnosis present

## 2023-01-31 DIAGNOSIS — I2510 Atherosclerotic heart disease of native coronary artery without angina pectoris: Secondary | ICD-10-CM | POA: Diagnosis present

## 2023-01-31 DIAGNOSIS — E871 Hypo-osmolality and hyponatremia: Secondary | ICD-10-CM | POA: Diagnosis present

## 2023-01-31 DIAGNOSIS — I5189 Other ill-defined heart diseases: Secondary | ICD-10-CM

## 2023-01-31 DIAGNOSIS — E876 Hypokalemia: Secondary | ICD-10-CM | POA: Diagnosis not present

## 2023-01-31 DIAGNOSIS — R109 Unspecified abdominal pain: Secondary | ICD-10-CM | POA: Diagnosis not present

## 2023-01-31 LAB — CBC WITH DIFFERENTIAL/PLATELET
Abs Immature Granulocytes: 0.09 10*3/uL — ABNORMAL HIGH (ref 0.00–0.07)
Basophils Absolute: 0 10*3/uL (ref 0.0–0.1)
Basophils Relative: 0 %
Eosinophils Absolute: 0 10*3/uL (ref 0.0–0.5)
Eosinophils Relative: 0 %
HCT: 32.9 % — ABNORMAL LOW (ref 39.0–52.0)
Hemoglobin: 10.7 g/dL — ABNORMAL LOW (ref 13.0–17.0)
Immature Granulocytes: 1 %
Lymphocytes Relative: 3 %
Lymphs Abs: 0.4 10*3/uL — ABNORMAL LOW (ref 0.7–4.0)
MCH: 27.8 pg (ref 26.0–34.0)
MCHC: 32.5 g/dL (ref 30.0–36.0)
MCV: 85.5 fL (ref 80.0–100.0)
Monocytes Absolute: 0.7 10*3/uL (ref 0.1–1.0)
Monocytes Relative: 7 %
Neutro Abs: 9.7 10*3/uL — ABNORMAL HIGH (ref 1.7–7.7)
Neutrophils Relative %: 89 %
Platelets: 135 10*3/uL — ABNORMAL LOW (ref 150–400)
RBC: 3.85 MIL/uL — ABNORMAL LOW (ref 4.22–5.81)
RDW: 13.9 % (ref 11.5–15.5)
WBC: 10.8 10*3/uL — ABNORMAL HIGH (ref 4.0–10.5)
nRBC: 0 % (ref 0.0–0.2)

## 2023-01-31 LAB — COMPREHENSIVE METABOLIC PANEL
ALT: 29 U/L (ref 0–44)
AST: 27 U/L (ref 15–41)
Albumin: 2.8 g/dL — ABNORMAL LOW (ref 3.5–5.0)
Alkaline Phosphatase: 66 U/L (ref 38–126)
Anion gap: 10 (ref 5–15)
BUN: 33 mg/dL — ABNORMAL HIGH (ref 8–23)
CO2: 26 mmol/L (ref 22–32)
Calcium: 8.8 mg/dL — ABNORMAL LOW (ref 8.9–10.3)
Chloride: 95 mmol/L — ABNORMAL LOW (ref 98–111)
Creatinine, Ser: 1.59 mg/dL — ABNORMAL HIGH (ref 0.61–1.24)
GFR, Estimated: 43 mL/min — ABNORMAL LOW (ref >60)
Glucose, Bld: 152 mg/dL — ABNORMAL HIGH (ref 70–99)
Potassium: 3.2 mmol/L — ABNORMAL LOW (ref 3.5–5.1)
Sodium: 131 mmol/L — ABNORMAL LOW (ref 135–145)
Total Bilirubin: 1 mg/dL (ref 0.3–1.2)
Total Protein: 8.2 g/dL — ABNORMAL HIGH (ref 6.5–8.1)

## 2023-01-31 LAB — URINALYSIS, ROUTINE W REFLEX MICROSCOPIC
Bilirubin Urine: NEGATIVE
Glucose, UA: NEGATIVE mg/dL
Ketones, ur: NEGATIVE mg/dL
Nitrite: NEGATIVE
Protein, ur: 100 mg/dL — AB
RBC / HPF: >50 RBC/hpf (ref 0–5)
Specific Gravity, Urine: 1.014 (ref 1.005–1.030)
WBC, UA: >50 WBC/hpf (ref 0–5)
pH: 5 (ref 5.0–8.0)

## 2023-01-31 MED ORDER — SILDENAFIL CITRATE 20 MG PO TABS
20.0000 mg | ORAL_TABLET | Freq: Every day | ORAL | Status: DC
  Administered 2023-02-01 – 2023-02-02 (×2): 20 mg via ORAL
  Filled 2023-01-31 (×3): qty 1

## 2023-01-31 MED ORDER — MORPHINE SULFATE (PF) 2 MG/ML IV SOLN
2.0000 mg | INTRAVENOUS | Status: DC | PRN
  Administered 2023-02-01: 2 mg via INTRAVENOUS
  Filled 2023-01-31: qty 1

## 2023-01-31 MED ORDER — IOHEXOL 300 MG/ML  SOLN
100.0000 mL | Freq: Once | INTRAMUSCULAR | Status: AC | PRN
  Administered 2023-01-31: 100 mL via INTRAVENOUS

## 2023-01-31 MED ORDER — ONDANSETRON HCL 4 MG PO TABS: 4.0000 mg | ORAL_TABLET | Freq: Four times a day (QID) | ORAL | Status: DC | PRN

## 2023-01-31 MED ORDER — MAGNESIUM SULFATE 2 GM/50ML IV SOLN
2.0000 g | Freq: Once | INTRAVENOUS | Status: AC
  Administered 2023-01-31: 2 g via INTRAVENOUS
  Filled 2023-01-31: qty 50

## 2023-01-31 MED ORDER — ONDANSETRON HCL 4 MG/2ML IJ SOLN: 4.0000 mg | Freq: Four times a day (QID) | INTRAMUSCULAR | Status: DC | PRN

## 2023-01-31 MED ORDER — SODIUM CHLORIDE 0.9 % IV SOLN
1.0000 g | INTRAVENOUS | Status: DC
  Administered 2023-02-01 – 2023-02-02 (×2): 1 g via INTRAVENOUS
  Filled 2023-01-31 (×2): qty 10

## 2023-01-31 MED ORDER — TAMSULOSIN HCL 0.4 MG PO CAPS
0.4000 mg | ORAL_CAPSULE | Freq: Every day | ORAL | Status: DC
  Administered 2023-01-31 – 2023-02-02 (×3): 0.4 mg via ORAL
  Filled 2023-01-31 (×3): qty 1

## 2023-01-31 MED ORDER — ACETAMINOPHEN 325 MG PO TABS
650.0000 mg | ORAL_TABLET | Freq: Four times a day (QID) | ORAL | Status: DC | PRN
  Administered 2023-01-31: 650 mg via ORAL
  Filled 2023-01-31: qty 2

## 2023-01-31 MED ORDER — POTASSIUM CHLORIDE CRYS ER 20 MEQ PO TBCR
40.0000 meq | EXTENDED_RELEASE_TABLET | Freq: Once | ORAL | Status: AC
  Administered 2023-01-31: 40 meq via ORAL
  Filled 2023-01-31: qty 2

## 2023-01-31 MED ORDER — LATANOPROST 0.005 % OP SOLN
1.0000 [drp] | Freq: Every day | OPHTHALMIC | Status: DC
  Administered 2023-01-31 – 2023-02-01 (×2): 1 [drp] via OPHTHALMIC
  Filled 2023-01-31: qty 2.5

## 2023-01-31 MED ORDER — DOCUSATE SODIUM 100 MG PO CAPS: 100.0000 mg | ORAL_CAPSULE | Freq: Every day | ORAL | Status: DC | PRN

## 2023-01-31 MED ORDER — BRIMONIDINE TARTRATE 0.2 % OP SOLN
1.0000 [drp] | Freq: Two times a day (BID) | OPHTHALMIC | Status: DC
  Administered 2023-02-01 – 2023-02-02 (×3): 1 [drp] via OPHTHALMIC
  Filled 2023-01-31: qty 5

## 2023-01-31 MED ORDER — AMLODIPINE BESYLATE 5 MG PO TABS
10.0000 mg | ORAL_TABLET | Freq: Every day | ORAL | Status: DC
  Administered 2023-02-01 – 2023-02-02 (×2): 10 mg via ORAL
  Filled 2023-01-31 (×2): qty 2

## 2023-01-31 MED ORDER — ATORVASTATIN CALCIUM 10 MG PO TABS
10.0000 mg | ORAL_TABLET | ORAL | Status: DC
  Administered 2023-02-01: 10 mg via ORAL
  Filled 2023-01-31: qty 1

## 2023-01-31 MED ORDER — APIXABAN 5 MG PO TABS
5.0000 mg | ORAL_TABLET | Freq: Two times a day (BID) | ORAL | Status: DC
  Administered 2023-01-31 – 2023-02-02 (×4): 5 mg via ORAL
  Filled 2023-01-31 (×4): qty 1

## 2023-01-31 MED ORDER — FINASTERIDE 5 MG PO TABS
5.0000 mg | ORAL_TABLET | Freq: Every day | ORAL | Status: DC
  Administered 2023-02-01 – 2023-02-02 (×2): 5 mg via ORAL
  Filled 2023-01-31 (×3): qty 1

## 2023-01-31 MED ORDER — SODIUM CHLORIDE 0.9 % IV BOLUS
500.0000 mL | Freq: Once | INTRAVENOUS | Status: AC
  Administered 2023-01-31: 500 mL via INTRAVENOUS

## 2023-01-31 MED ORDER — POTASSIUM CHLORIDE IN NACL 20-0.9 MEQ/L-% IV SOLN
INTRAVENOUS | Status: AC
  Administered 2023-01-31: 1000 mL via INTRAVENOUS
  Filled 2023-01-31 (×2): qty 1000

## 2023-01-31 MED ORDER — ACETAMINOPHEN 650 MG RE SUPP: 650.0000 mg | Freq: Four times a day (QID) | RECTAL | Status: DC | PRN

## 2023-01-31 MED ORDER — SODIUM CHLORIDE 0.9 % IV SOLN
1.0000 g | Freq: Once | INTRAVENOUS | Status: AC
  Administered 2023-01-31: 1 g via INTRAVENOUS
  Filled 2023-01-31: qty 10

## 2023-01-31 MED ORDER — MORPHINE SULFATE (PF) 2 MG/ML IV SOLN
2.0000 mg | Freq: Once | INTRAVENOUS | Status: AC
  Administered 2023-01-31: 2 mg via INTRAVENOUS
  Filled 2023-01-31: qty 1

## 2023-01-31 NOTE — H&P (Signed)
History and Physical    Patient: Lawrence Bailey:[id number] DOB: [birth year] DOA: 01/31/2023 DOS: the patient was seen and examined on 01/31/2023 PCP: Dorothyann Peng, MD  Patient coming from: Home  Chief Complaint:  Chief Complaint  Patient presents with   Abdominal Pain   Fever   HPI: Lawrence Bailey is a 82 y.o. male with medical history significant of BPH, CAD, malignant GIST, glaucoma, hypertension, prostate cancer, hypertensive nephropathy, CKD, normocytic anemia, syncope, pulmonary contusion, MGUS, left temporal headache, paroxysmal atrial fibrillation on apixaban who presented to the emergency department with complaints of abdominal pain, fever, chills, malaise and decreased appetite for the past 3 days.  He was treated for an UTI about 3 weeks ago that grew E. coli.  Review of system and also revealed left flank pain, mild dyspnea, dysuria, night sweats and difficulty sleeping.  No hematuria.  No rhinorrhea, sore throat, wheezing or hemoptysis.  No chest pain, palpitations, diaphoresis, PND, orthopnea or recent pitting edema of the lower extremities.  No diarrhea, constipation, melena or hematochezia.  No polyuria, polydipsia, polyphagia or blurred vision.   Lab work: He is urine analysis was turbid with large hemoglobin, proteinuria 100 mg deciliter, large leukocyte esterase, greater than 50 RBC, greater than 50 WBC, many bacteria and positive WBC clumps.  CBC with a white count of 10.8 with 89% neutrophils, hemoglobin 10.7 g/dL and platelets 811.  CMP showed a sodium 131, potassium 3.2, chloride 95 and CO2 26 mmol/L.  Glucose 152, BUN 33, creatinine 1.59 and calcium 8.8 mg/dL.  Total protein 8.2 and albumin 2.8 g/dL.  Imaging: Portable 1 view chest radiograph with no active disease.  CT abdomen/pelvis with contrast with findings concerning for left-sided pyelonephritis and mild bladder wall thickening.  Stable bilateral renal cysts.  Small nonobstructive left renal calculus.  Stable  moderate prostatic enlargement.  Small amount of free fluid seen in the pelvis.  Aortic atherosclerosis.  ED course: Initial vital signs were temperature 100 F, pulse 71, respiration 18, BP 146/87 mmHg O2 sat 98% on room air.  The patient received ceftriaxone 1 g IVPB, morphine 2 mg IVP and normal saline 500 mL bolus.   Review of Systems: As mentioned in the history of present illness. All other systems reviewed and are negative. Past Medical History:  Diagnosis Date   BPH (benign prostatic hyperplasia)    Coronary artery disease    GIST (gastrointestinal stromal tumor), malignant (HCC) dx'd 04/2007   gleevac comp 04/2008   Glaucoma    Hypertension    Prostate cancer (HCC)    Renal insufficiency    Past Surgical History:  Procedure Laterality Date   ABDOMINAL SURGERY     EYE SURGERY     HERNIA REPAIR     KNEE SURGERY     Social History:  reports that he has never smoked. He has never used smokeless tobacco. He reports that he does not currently use alcohol. He reports that he does not use drugs.  Allergies  Allergen Reactions   Other     Bee sting    Sucralfate Other (See Comments)   Family History  Problem Relation Age of Onset   Hypertension Mother    Cancer Mother    Breast cancer Neg Hx    Prostate cancer Neg Hx    Colon cancer Neg Hx    Pancreatic cancer Neg Hx    Prior to Admission medications   Medication Sig Start Date End Date Taking? Authorizing Provider  amLODipine (NORVASC) 10  MG tablet Take 10 mg by mouth daily. 04/13/17  Yes [provider]  apixaban (ELIQUIS) 5 MG TABS tablet Take 1 tablet (5 mg total) by mouth 2 (two) times daily. 03/13/22  Yes Meriam Sprague, MD  atorvastatin (LIPITOR) 10 MG tablet TAKE 1 TABLET BY MOUTH MONDAY - FRIDAY AT BEDTIME Patient taking differently: Take 10 mg by mouth See admin instructions. Take 1 tablet by mouth Monday - Friday at bedtime 12/14/22  Yes Dorothyann Peng, MD  brimonidine [facility]) 0.2 %  ophthalmic solution 1 drop 2 (two) times daily. 04/25/19  Yes [provider]  docusate sodium (COLACE) 100 MG capsule Take 100 mg by mouth daily as needed for mild constipation.   Yes [provider]  finasteride (PROSCAR) 5 MG tablet Take 5 mg by mouth daily. 11/14/21  Yes [provider]  fluticasone (FLONASE) 50 MCG/ACT nasal spray INHALE 2 SPRAYS INTO EACH NOSTRIL EVERY NIGHT Patient taking differently: Place 2 sprays into both nostrils See admin instructions. INHALE 2 SPRAYS INTO EACH NOSTRIL EVERY NIGHT 11/09/22  Yes Dorothyann Peng, MD  latanoprost (XALATAN) 0.005 % ophthalmic solution Place 1 drop into both eyes at bedtime.   Yes [provider]  OVER THE COUNTER MEDICATION Take 1 capsule by mouth daily at 2 am. Sea moss   Yes [provider]  sildenafil (REVATIO) 20 MG tablet Take 20 mg by mouth 3 (three) times daily.   Yes [provider]  tamsulosin (FLOMAX) 0.4 MG CAPS capsule Take 0.4 mg by mouth daily.   Yes [provider]  valsartan-hydrochlorothiazide (DIOVAN-HCT) 320-25 MG tablet TAKE 1 TABLET BY MOUTH EVERY DAY 01/18/23  Yes Dorothyann Peng, MD  doxycycline (VIBRA-TABS) 100 MG tablet Take 1 tablet (100 mg total) by mouth 2 (two) times daily. Patient not taking: Reported on 01/31/2023 12/28/22   Dorothyann Peng, MD  famotidine (PEPCID) 20 MG tablet TAKE 1 TABLET BY MOUTH EVERY DAY Patient not taking: Reported on 01/31/2023 08/17/22   Dorothyann Peng, MD    Physical Exam: Vitals:   01/31/23 0954 01/31/23 1100 01/31/23 1144 01/31/23 1442  BP: (!) 146/87 135/81  124/75  Pulse: (!) 101 95  91  Resp: 18 (!) 24  (!) 22  Temp: 100 F (37.8 C)  100.2 F (37.9 C) 98.9 F (37.2 C)  TempSrc: Oral  Oral Oral  SpO2: 98% 97%  99%  Weight: 79.4 kg     Height: 5\' 8"  (1.727 m)      Physical Exam Vitals and nursing note reviewed.  Constitutional:      General: He is awake. He is not in acute distress.    Appearance: He is  well-developed. He is ill-appearing. He is not toxic-appearing.  HENT:     Head: Normocephalic.     Nose: No rhinorrhea.     Mouth/Throat:     Mouth: Mucous membranes are moist.  Eyes:     General: No scleral icterus.    Pupils: Pupils are equal, round, and reactive to light.  Neck:     Vascular: No JVD.  Cardiovascular:     Rate and Rhythm: Normal rate and regular rhythm.     Heart sounds: S1 normal and S2 normal.  Pulmonary:     Effort: Pulmonary effort is normal.     Breath sounds: Normal breath sounds.  Abdominal:     General: There is no distension.     Palpations: Abdomen is soft.     Tenderness: There is abdominal tenderness in the  suprapubic area. There is no right CVA tenderness or left CVA tenderness.  Musculoskeletal:     Cervical back: Neck supple.     Right lower leg: No edema.     Left lower leg: No edema.  Skin:    General: Skin is warm and dry.  Neurological:     General: No focal deficit present.     Mental Status: He is alert and oriented to person, place, and time.  Psychiatric:        Mood and Affect: Mood normal.        Behavior: Behavior normal. Behavior is cooperative.    Data Reviewed:  Results are pending, will review when available.  04/01/2018 echocardiogram Study Conclusions:  - Left ventricle: The cavity size was normal. There was mild    concentric hypertrophy. Systolic function was normal. The    estimated ejection fraction was in the range of 60% to 65%. Wall    motion was normal; there were no regional wall motion    abnormalities. Features are consistent with a pseudonormal left    ventricular filling pattern, with concomitant abnormal relaxation    and increased filling pressure (grade 2 diastolic dysfunction).    Doppler parameters are consistent with high ventricular filling    pressure (E/e&' 25). L wave present on mitral inflow, and systolic    blunting in pulmonary vein flow.  - Aortic valve: Sclerosis without stenosis.  Transvalvular velocity    was within the normal range. There was no stenosis. There was    trivial regurgitation.  - Mitral valve: Transvalvular velocity was within the normal range.    There was no evidence for stenosis. There was trivial    regurgitation.  - Left atrium: The atrium was mildly dilated.  - Right ventricle: The cavity size was normal. Wall thickness was    normal. Systolic function was normal. RV systolic pressure (S,    est): 54 mm Hg.  - Right atrium: The atrium was normal in size. Central venous    pressure (est): 3 mm Hg.  - Tricuspid valve: There was mild regurgitation.  - Pulmonic valve: There was mild regurgitation.  - Pulmonary arteries: Systolic pressure was severely increased. PA    peak pressure: 54 mm Hg (S).  - Inferior vena cava: The vessel was normal in size. The    respirophasic diameter changes were in the normal range (= 50%),    consistent with normal central venous pressure.  - Pericardium, extracardiac: A trivial pericardial effusion was    identified posterior to the heart.   Impressions:   - Compared to the exam from 11/10/2016, the aortic regurgitation,    mitral regurgitation, tricuspid regurgitation, and pulmonary    regurgitation all appear to have decreased based on color flow.    RVSP has increased. Side by side comparison of images performed.    Assessment and Plan: Principal Problem:   Acute lower UTI (urinary tract infection) Admit to telemetry/inpatient. Continue IV fluids. Continue ceftriaxone 1 g IVPB daily. Follow-up urine culture and sensitivity. Follow-up blood culture and sensitivity. Follow-up CBC and chemistry in the morning.  Active Problems:   Stage 3 chronic kidney disease (HCC) Creatinine slightly elevated over baseline. Continue IV fluids. Follow-up level in AM.    Essential hypertension Hold valsartan/HCTZ for now. Resume when renal function allows.    Hyperglycemia Check fasting glucose. Check  hemoglobin A1c if elevated in AM.    Glaucoma Continue Alphagan and Xalatan drops. Follow-up with ophthalmology as an  outpatient.    Atherosclerosis of aorta (HCC)   Hyperlipidemia  Continue atorvastatin 10 mg p.o. daily.    Paroxysmal atrial fibrillation (HCC) CHA?DS?-VASc Score of 6. Rate is controlled. Keep electrolytes optimized. Continue apixaban 5 mg p.o. twice daily.    Grade II diastolic dysfunction  Looks volume depleted. Check echocardiogram.    Hyponatremia Secondary to GI losses/HCTZ use. Continue IV fluids and follow-up sodium level in AM.    Hypokalemia 2ry GI losses and diuretic use. Replacing. Supplemented magnesium. Follow-up potassium level in AM.    Thrombocytopenia (HCC) Monitor platelet count.    Normocytic anemia Monitor hematocrit and hemoglobin. Transfuse as needed.    Moderate protein malnutrition (HCC) Protein supplementation. Consider nutritional services evaluation.    BMI 26.0-26.9,adult Overweight. Current BMI 26.61 kg/m. Follow-up with PCP.   Advance Care Planning:   Code Status: Full Code   Consults:   Family Communication: His wife Lawrence Bailey was at bedside.  Severity of Illness: The appropriate patient status for this patient is INPATIENT. Inpatient status is judged to be reasonable and necessary in order to provide the required intensity of service to ensure the patient's safety. The patient's presenting symptoms, physical exam findings, and initial radiographic and laboratory data in the context of their chronic comorbidities is felt to place them at high risk for further clinical deterioration. Furthermore, it is not anticipated that the patient will be medically stable for discharge from the hospital within 2 midnights of admission.   * I certify that at the point of admission it is my clinical judgment that the patient will require inpatient hospital care spanning beyond 2 midnights from the point of admission due to  high intensity of service, high risk for further deterioration and high frequency of surveillance required.*  Author: Bobette Mo, MD 01/31/2023 2:47 PM  For on call review [email or web address].   This document was prepared using Dragon voice recognition software and may contain some unintended transcription errors.

## 2023-01-31 NOTE — Progress Notes (Signed)
   01/31/23 2133  BiPAP/CPAP/SIPAP  $ Non-Invasive Home Ventilator  Initial (Pts home machine prefer self placement, wife brought distiled water from home)  BiPAP/CPAP/SIPAP Pt Type Adult  BiPAP/CPAP/SIPAP Resmed  FiO2 (%) 21 %  Patient Home Equipment Yes  Safety Check Completed by RT for Home Unit Yes, no issues noted  BiPAP/CPAP /SiPAP Vitals  Resp 17  MEWS Score/Color  MEWS Score 0  MEWS Score Color Green

## 2023-01-31 NOTE — Progress Notes (Signed)
   01/31/23 2133  BiPAP/CPAP/SIPAP  $ Non-Invasive Home Ventilator  Initial (Pts home machine prefer self placement)  BiPAP/CPAP/SIPAP Pt Type Adult  BiPAP/CPAP/SIPAP Resmed  FiO2 (%) 21 %  Patient Home Equipment Yes  Safety Check Completed by RT for Home Unit Yes, no issues noted  BiPAP/CPAP /SiPAP Vitals  Resp 17  MEWS Score/Color  MEWS Score 0  MEWS Score Color Green

## 2023-01-31 NOTE — ED Triage Notes (Signed)
Pt arrive from home reports abdominal pain, generalized. Also reports decease in appetite, chills and fever for 2/3 days. Denies any other symptoms.

## 2023-01-31 NOTE — ED Notes (Signed)
ED TO INPATIENT HANDOFF REPORT  ED Nurse Name and Phone #: Jenice Leiner  S Name/Age/Gender Lawrence Bailey 82 y.o. male Room/Bed: WA03/WA03  Code Status   Code Status: Prior  Home/SNF/Other Home Patient oriented to: self, place, time, and situation Is this baseline? Yes   Triage Complete: Triage complete  Chief Complaint Acute lower UTI (urinary tract infection) [N39.0]  Triage Note Pt arrive from home reports abdominal pain, generalized. Also reports decease in appetite, chills and fever for 2/3 days. Denies any other symptoms.    Allergies Allergies  Allergen Reactions   Other     Bee sting    Sucralfate Other (See Comments)    Level of Care/Admitting Diagnosis ED Disposition     ED Disposition  Admit   Condition  --   Comment  Hospital Area: [facility] [[id number]]  Level of Care: Telemetry [5]  Admit to tele based on following criteria: Complex arrhythmia (Bradycardia/Tachycardia)  May admit patient to [facility] or [facility] if equivalent level of care is available:: No  Covid Evaluation: Asymptomatic - no recent exposure (last 10 days) testing not required  Diagnosis: Acute lower UTI (urinary tract infection) [284132]  Admitting Physician: Bobette Mo [[id number]]  Attending Physician: Bobette Mo [[id number]]  Certification:: I certify this patient will need inpatient services for at least 2 midnights  Expected Medical Readiness: 02/02/2023          B Medical/Surgery History Past Medical History:  Diagnosis Date   BPH (benign prostatic hyperplasia)    Coronary artery disease    GIST (gastrointestinal stromal tumor), malignant (HCC) dx'd 04/2007   gleevac comp 04/2008   Glaucoma    Hypertension    Prostate cancer [facility])    Renal insufficiency    Past Surgical History:  Procedure Laterality Date   ABDOMINAL SURGERY     EYE SURGERY     HERNIA REPAIR     KNEE SURGERY       A IV Location/Drains/Wounds Patient  Lines/Drains/Airways Status     Active Line/Drains/Airways     Name Placement date Placement time Site Days   Peripheral IV 01/31/23 20 G 1" Anterior;Distal;Right;Upper Arm 01/31/23  1045  Arm  less than 1   Wound 01/24/12 Abrasion(s) Arm Right;Upper 01/24/12  2330  Arm  4025   Wound 01/24/12 Abrasion(s) Arm Lower;Right 01/24/12  2330  Arm  4025   Wound 01/24/12 Other (Comment) Thigh Right;Lateral Hematoma 01/24/12  2330  Thigh  4025            Intake/Output Last 24 hours No intake or output data in the 24 hours ending 01/31/23 1458  Labs/Imaging Results for orders placed or performed during the hospital encounter of 01/31/23 (from the past 48 hour(s))  CBC with Differential     Status: Abnormal   Collection Time: 01/31/23 10:45 AM  Result Value Ref Range   WBC 10.8 (H) 4.0 - 10.5 K/uL   RBC 3.85 (L) 4.22 - 5.81 MIL/uL   Hemoglobin 10.7 (L) 13.0 - 17.0 g/dL   HCT 03.4 (L) 74.2 - 59.5 %   MCV 85.5 80.0 - 100.0 fL   MCH 27.8 26.0 - 34.0 pg   MCHC 32.5 30.0 - 36.0 g/dL   RDW 63.8 75.6 - 43.3 %   Platelets 135 (L) 150 - 400 K/uL   nRBC 0.0 0.0 - 0.2 %   Neutrophils Relative % 89 %   Neutro Abs 9.7 (H) 1.7 - 7.7 K/uL   Lymphocytes  Relative 3 %   Lymphs Abs 0.4 (L) 0.7 - 4.0 K/uL   Monocytes Relative 7 %   Monocytes Absolute 0.7 0.1 - 1.0 K/uL   Eosinophils Relative 0 %   Eosinophils Absolute 0.0 0.0 - 0.5 K/uL   Basophils Relative 0 %   Basophils Absolute 0.0 0.0 - 0.1 K/uL   Immature Granulocytes 1 %   Abs Immature Granulocytes 0.09 (H) 0.00 - 0.07 K/uL    Comment: Performed at [facility], [address], [place], [place] [address]  Comprehensive metabolic panel     Status: Abnormal   Collection Time: 01/31/23 10:45 AM  Result Value Ref Range   Sodium 131 (L) 135 - 145 mmol/L   Potassium 3.2 (L) 3.5 - 5.1 mmol/L   Chloride 95 (L) 98 - 111 mmol/L   CO2 26 22 - 32 mmol/L   Glucose, Bld 152 (H) 70 - 99 mg/dL    Comment: Glucose reference range  applies only to samples taken after fasting for at least 8 hours.   BUN 33 (H) 8 - 23 mg/dL   Creatinine, Ser 6.04 (H) 0.61 - 1.24 mg/dL   Calcium 8.8 (L) 8.9 - 10.3 mg/dL   Total Protein 8.2 (H) 6.5 - 8.1 g/dL   Albumin 2.8 (L) 3.5 - 5.0 g/dL   AST 27 15 - 41 U/L   ALT 29 0 - 44 U/L   Alkaline Phosphatase 66 38 - 126 U/L   Total Bilirubin 1.0 0.3 - 1.2 mg/dL   GFR, Estimated 43 (L) >60 mL/min    Comment: (NOTE) Calculated using the CKD-EPI Creatinine Equation (2021)    Anion gap 10 5 - 15    Comment: Performed at [facility], [address] [place], [place] [address]  Urinalysis, Routine w reflex microscopic -Urine, Clean Catch     Status: Abnormal   Collection Time: 01/31/23 12:50 PM  Result Value Ref Range   Color, Urine AMBER (A) YELLOW    Comment: BIOCHEMICALS MAY BE AFFECTED BY COLOR   APPearance TURBID (A) CLEAR   Specific Gravity, Urine 1.014 1.005 - 1.030   pH 5.0 5.0 - 8.0   Glucose, UA NEGATIVE NEGATIVE mg/dL   Hgb urine dipstick LARGE (A) NEGATIVE   Bilirubin Urine NEGATIVE NEGATIVE   Ketones, ur NEGATIVE NEGATIVE mg/dL   Protein, ur 119 (A) NEGATIVE mg/dL   Nitrite NEGATIVE NEGATIVE   Leukocytes,Ua LARGE (A) NEGATIVE   RBC / HPF >50 0 - 5 RBC/hpf   WBC, UA >50 0 - 5 WBC/hpf   Bacteria, UA MANY (A) NONE SEEN   Squamous Epithelial / HPF 0-5 0 - 5 /HPF   WBC Clumps PRESENT     Comment: Performed at [facility], [address] [place], [place] [address]   CT ABDOMEN PELVIS W CONTRAST  Result Date: 01/31/2023 CLINICAL DATA:  Acute generalized abdominal pain. EXAM: CT ABDOMEN AND PELVIS WITH CONTRAST TECHNIQUE: Multidetector CT imaging of the abdomen and pelvis was performed using the standard protocol following bolus administration of intravenous contrast. RADIATION DOSE REDUCTION: This exam was performed according to the departmental dose-optimization program which includes automated exposure control, adjustment of the mA  and/or kV according to patient size and/or use of iterative reconstruction technique. CONTRAST:  OMNIPAQUE IOHEXOL 300 MG/ML  SOLN COMPARISON:  May 12, 2018. FINDINGS: Lower chest: No acute abnormality. Hepatobiliary: No focal liver abnormality is seen. No gallstones, gallbladder wall thickening, or biliary dilatation. Pancreas: Unremarkable. No pancreatic  ductal dilatation or surrounding inflammatory changes. Spleen: Normal in size without focal abnormality. Adrenals/Urinary Tract: Adrenal glands appear normal. Stable large bilateral renal cysts are noted for which no further follow-up is required. Small nonobstructive left renal calculus is noted. No hydronephrosis or renal obstruction is noted. Urinary bladder is decompressed. Possible bladder wall thickening is noted suggesting cystitis. Ill-defined areas of decreased enhancement are seen involving the left renal cortex concerning for pyelonephritis. Stomach/Bowel: Stomach is within normal limits. Appendix appears normal. No evidence of bowel wall thickening, distention, or inflammatory changes. Vascular/Lymphatic: Aortic atherosclerosis. No enlarged abdominal or pelvic lymph nodes. Reproductive: Stable moderate prostatic enlargement is noted. Other: No hernia is noted. Small amount of free fluid is noted posteriorly in the pelvis of indeterminate etiology. Musculoskeletal: No acute or significant osseous findings. IMPRESSION: Ill-defined areas of decreased enhancement are seen involving the left renal cortex concerning for left-sided pyelonephritis. Urinary bladder is nondistended, but mild bladder wall thickening is noted. This may be due to lack of distension, but cystitis cannot be excluded. Stable bilateral renal cysts are noted for which no further follow-up is required. Small nonobstructive left renal calculus. Stable moderate prostatic enlargement. Small amount of free fluid is noted posteriorly in the pelvis of indeterminate etiology, but  potentially may be related to previously described inflammation. Aortic Atherosclerosis (ICD10-I70.0). Electronically Signed   By: Lupita Raider M.D.   On: 01/31/2023 12:35   DG Chest Port 1 View  Result Date: 01/31/2023 CLINICAL DATA:  Shortness of breath. EXAM: PORTABLE CHEST 1 VIEW COMPARISON:  February 28, 2018. FINDINGS: Stable cardiomediastinal silhouette. Both lungs are clear. The visualized skeletal structures are unremarkable. IMPRESSION: No active disease. Electronically Signed   By: Lupita Raider M.D.   On: 01/31/2023 10:57    Pending Labs Unresulted Labs (From admission, onward)     Start     Ordered   01/31/23 1408  Urine Culture  Once,   URGENT       Question Answer Comment  Indication Dysuria   Patient immune status Immunocompromised   Release to patient Immediate      01/31/23 1407            Vitals/Pain Today's Vitals   01/31/23 0955 01/31/23 1100 01/31/23 1144 01/31/23 1442  BP:  135/81  124/75  Pulse:  95  91  Resp:  (!) 24  (!) 22  Temp:   100.2 F (37.9 C) 98.9 F (37.2 C)  TempSrc:   Oral Oral  SpO2:  97%  99%  Weight:      Height:      PainSc: 2        Isolation Precautions No active isolations  Medications Medications  cefTRIAXone (ROCEPHIN) 1 g in sodium chloride 0.9 % 100 mL IVPB (1 g Intravenous New Bag/Given 01/31/23 1433)  iohexol (OMNIPAQUE) 300 MG/ML solution 100 mL (100 mLs Intravenous Contrast Given 01/31/23 1211)  sodium chloride 0.9 % bolus 500 mL (500 mLs Intravenous New Bag/Given 01/31/23 1432)  morphine (PF) 2 MG/ML injection 2 mg (2 mg Intravenous Given 01/31/23 1426)    Mobility walks     Focused Assessments    R Recommendations: See Admitting Provider Note  Report given to:   Additional Notes:

## 2023-01-31 NOTE — ED Provider Notes (Signed)
[place] EMERGENCY DEPARTMENT AT [facility] Provider Note   CSN: [id number] Arrival date & time: 01/31/23  4098     History  Chief Complaint  Patient presents with   Abdominal Pain   Fever    Lawrence Bailey is a 82 y.o. male.  Pt complains of abdominal pain for the past 4 days.  Patient's family reports patient has been short of breath with walking.  Patient reports that he has been having diarrhea.  Family reports patient has had a very poor appetite.  Patient has a past medical history of stomach cancer.  He has had surgery for stomach cancer he also has had surgery for a hernia repair.  He has a history of hypertension.  He has a history of prostate problems and a history of urinary tract infections.  Patient reports he recently returned from [place] and began having chills while he was on the plane.  The history is provided by the patient. No language interpreter was used.  Abscess   The history is provided by the patient and a relative.  Abdominal Pain Pain location:  Generalized Associated symptoms: diarrhea, dysuria and fever   Fever Associated symptoms: diarrhea and dysuria        Home Medications Prior to Admission medications   Medication Sig Start Date End Date Taking? Authorizing Provider  amLODipine (NORVASC) 10 MG tablet Take 10 mg by mouth daily. 04/13/17   [provider]  apixaban (ELIQUIS) 5 MG TABS tablet Take 1 tablet (5 mg total) by mouth 2 (two) times daily. 03/13/22   Meriam Sprague, MD  atorvastatin (LIPITOR) 10 MG tablet TAKE 1 TABLET BY MOUTH MONDAY - FRIDAY AT BEDTIME 12/14/22   Dorothyann Peng, MD  brimonidine [facility]) 0.2 % ophthalmic solution 1 drop 2 (two) times daily. 04/25/19   [provider]  Cholecalciferol (VITAMIN D3) 125 MCG (5000 UT) CAPS Take 5,000 capsules by mouth daily.    [provider]  docusate sodium (COLACE) 100 MG capsule Take 100 mg by mouth 2 (two) times daily.    [provider]  dorzolamide-timolol (COSOPT) 22.3-6.8 MG/ML ophthalmic solution Place 1 drop into both eyes 2 (two) times daily. 10/23/16   [provider]  doxycycline (VIBRA-TABS) 100 MG tablet Take 1 tablet (100 mg total) by mouth 2 (two) times daily. 12/28/22   Dorothyann Peng, MD  famotidine (PEPCID) 20 MG tablet TAKE 1 TABLET BY MOUTH EVERY DAY 08/17/22   Dorothyann Peng, MD  Fexofenadine HCl [facility] ALLERGY PO) Take 1 tablet by mouth daily.    [provider]  finasteride (PROSCAR) 5 MG tablet Take 5 mg by mouth daily. 11/14/21   [provider]  fluticasone (FLONASE) 50 MCG/ACT nasal spray INHALE 2 SPRAYS INTO EACH NOSTRIL EVERY NIGHT 11/09/22   Dorothyann Peng, MD  latanoprost (XALATAN) 0.005 % ophthalmic solution Place 1 drop into both eyes at bedtime.    [provider]  sildenafil (REVATIO) 20 MG tablet Take 20 mg by mouth 3 (three) times daily.    [provider]  tamsulosin (FLOMAX) 0.4 MG CAPS capsule Take 0.4 mg by mouth.    [provider]  valsartan-hydrochlorothiazide (DIOVAN-HCT) 320-25 MG tablet TAKE 1 TABLET BY MOUTH EVERY DAY 01/18/23   Dorothyann Peng, MD      Allergies    Other and Sucralfate    Review of Systems   Review of Systems  Constitutional:  Positive for fever.  Gastrointestinal:  Positive for abdominal pain  and diarrhea.  Genitourinary:  Positive for dysuria.  Musculoskeletal:  Negative for back pain and gait problem.  All other systems reviewed and are negative.   Physical Exam Updated Vital Signs BP (!) 146/87 (BP Location: Right Arm)   Pulse (!) 101   Temp 100 F (37.8 C) (Oral)   Resp 18   Ht 5\' 8"  (1.727 m)   Wt 79.4 kg   SpO2 98%   BMI 26.61 kg/m  Physical Exam Vitals and nursing note reviewed.  Constitutional:      Appearance: He is well-developed.  HENT:     Head: Normocephalic.  Eyes:     Extraocular Movements: Extraocular movements intact.  Cardiovascular:     Rate and Rhythm: Normal  rate.  Pulmonary:     Effort: Pulmonary effort is normal.  Abdominal:     General: Abdomen is flat. There is no distension.     Palpations: Abdomen is soft.     Tenderness: There is generalized abdominal tenderness.     Hernia: No hernia is present.  Musculoskeletal:        General: Normal range of motion.     Cervical back: Normal range of motion.  Skin:    General: Skin is warm.  Neurological:     General: No focal deficit present.     Mental Status: He is alert and oriented to person, place, and time.  Psychiatric:        Mood and Affect: Mood normal.     ED Results / Procedures / Treatments   Labs (all labs ordered are listed, but only abnormal results are displayed) Labs Reviewed  CBC WITH DIFFERENTIAL/PLATELET - Abnormal; Notable for the following components:      Result Value   WBC 10.8 (*)    RBC 3.85 (*)    Hemoglobin 10.7 (*)    HCT 32.9 (*)    Platelets 135 (*)    Neutro Abs 9.7 (*)    Lymphs Abs 0.4 (*)    Abs Immature Granulocytes 0.09 (*)    All other components within normal limits  COMPREHENSIVE METABOLIC PANEL - Abnormal; Notable for the following components:   Sodium 131 (*)    Potassium 3.2 (*)    Chloride 95 (*)    Glucose, Bld 152 (*)    BUN 33 (*)    Creatinine, Ser 1.59 (*)    Calcium 8.8 (*)    Total Protein 8.2 (*)    Albumin 2.8 (*)    GFR, Estimated 43 (*)    All other components within normal limits  URINALYSIS, ROUTINE W REFLEX MICROSCOPIC - Abnormal; Notable for the following components:   Color, Urine AMBER (*)    APPearance TURBID (*)    Hgb urine dipstick LARGE (*)    Protein, ur 100 (*)    Leukocytes,Ua LARGE (*)    Bacteria, UA MANY (*)    All other components within normal limits  URINE CULTURE    EKG None  Radiology No results found.  Procedures Procedures    Medications Ordered in ED Medications - No data to display  ED Course/ Medical Decision Making/ A&P                                 Medical Decision  Making Patient complains of abdominal pain.  Patient reports symptoms began 3 days ago.  Patient has a history of stomach cancer.  Amount and/or Complexity  of Data Reviewed Independent Historian: spouse    Details: Patient is here with his wife who is supportive External Data Reviewed: notes.    Details: Primary care notes show urine culture from last month that grew E. coli sensitive to Rocephin. Labs: ordered. Decision-making details documented in ED Course.    Details: Labs ordered reviewed and interpreted patient has a slightly elevated white bated white blood cell count of 10.8. UA shows large leukocytes and many bacteria Sodium is 131 potassium is 3.2 patient's BUN is elevated at 33 creatinine is elevated at 1.59 Radiology: ordered and independent interpretation performed. Decision-making details documented in ED Course.    Details: CT scan shows possible left-sided pyelonephritis, patient has large renal cyst Discussion of management or test interpretation with external provider(s): Spoke with hospitalist Dr. Robb Matar who will see and admit  Risk Prescription drug management. Decision regarding hospitalization.           Final Clinical Impression(s) / ED Diagnoses Final diagnoses:  Pyelonephritis  Acute kidney injury [facility])  Weakness    Rx / DC Orders ED Discharge Orders     None         Osie Cheeks 01/31/23 1602    Wynetta Fines, MD 01/31/23 [phone]

## 2023-02-01 ENCOUNTER — Inpatient Hospital Stay (HOSPITAL_COMMUNITY): Payer: No Typology Code available for payment source

## 2023-02-01 ENCOUNTER — Ambulatory Visit: Payer: No Typology Code available for payment source | Admitting: Cardiology

## 2023-02-01 ENCOUNTER — Inpatient Hospital Stay: Payer: No Typology Code available for payment source | Admitting: Oncology

## 2023-02-01 ENCOUNTER — Inpatient Hospital Stay: Payer: No Typology Code available for payment source

## 2023-02-01 DIAGNOSIS — N390 Urinary tract infection, site not specified: Secondary | ICD-10-CM | POA: Diagnosis not present

## 2023-02-01 DIAGNOSIS — I4891 Unspecified atrial fibrillation: Secondary | ICD-10-CM | POA: Diagnosis not present

## 2023-02-01 DIAGNOSIS — I5030 Unspecified diastolic (congestive) heart failure: Secondary | ICD-10-CM | POA: Diagnosis not present

## 2023-02-01 LAB — COMPREHENSIVE METABOLIC PANEL
ALT: 23 U/L (ref 0–44)
AST: 20 U/L (ref 15–41)
Albumin: 2.3 g/dL — ABNORMAL LOW (ref 3.5–5.0)
Alkaline Phosphatase: 55 U/L (ref 38–126)
Anion gap: 9 (ref 5–15)
BUN: 31 mg/dL — ABNORMAL HIGH (ref 8–23)
CO2: 25 mmol/L (ref 22–32)
Calcium: 8.4 mg/dL — ABNORMAL LOW (ref 8.9–10.3)
Chloride: 98 mmol/L (ref 98–111)
Creatinine, Ser: 1.27 mg/dL — ABNORMAL HIGH (ref 0.61–1.24)
GFR, Estimated: 56 mL/min — ABNORMAL LOW (ref >60)
Glucose, Bld: 117 mg/dL — ABNORMAL HIGH (ref 70–99)
Potassium: 3.5 mmol/L (ref 3.5–5.1)
Sodium: 132 mmol/L — ABNORMAL LOW (ref 135–145)
Total Bilirubin: 0.6 mg/dL (ref 0.3–1.2)
Total Protein: 6.9 g/dL (ref 6.5–8.1)

## 2023-02-01 LAB — CBC
HCT: 29 % — ABNORMAL LOW (ref 39.0–52.0)
Hemoglobin: 9.2 g/dL — ABNORMAL LOW (ref 13.0–17.0)
MCH: 27.3 pg (ref 26.0–34.0)
MCHC: 31.7 g/dL (ref 30.0–36.0)
MCV: 86.1 fL (ref 80.0–100.0)
Platelets: 132 10*3/uL — ABNORMAL LOW (ref 150–400)
RBC: 3.37 MIL/uL — ABNORMAL LOW (ref 4.22–5.81)
RDW: 14 % (ref 11.5–15.5)
WBC: 8.5 10*3/uL (ref 4.0–10.5)
nRBC: 0 % (ref 0.0–0.2)

## 2023-02-01 LAB — ECHOCARDIOGRAM COMPLETE
AR max vel: 2.07 cm2
AV Area VTI: 2.12 cm2
AV Area mean vel: 2.04 cm2
AV Mean grad: 8 mmHg
AV Peak grad: 14 mmHg
Ao pk vel: 1.87 m/s
Area-P 1/2: 3.83 cm2
Height: 68 in
P 1/2 time: 395 ms
S' Lateral: 3.3 cm
Weight: 2800 [oz_av]

## 2023-02-01 MED ORDER — SODIUM CHLORIDE 0.9 % IV SOLN
INTRAVENOUS | Status: AC
  Administered 2023-02-01: 1000 mL via INTRAVENOUS

## 2023-02-01 MED ORDER — POLYETHYLENE GLYCOL 3350 17 G PO PACK
17.0000 g | PACK | Freq: Every day | ORAL | Status: DC
  Administered 2023-02-01 – 2023-02-02 (×2): 17 g via ORAL
  Filled 2023-02-01 (×2): qty 1

## 2023-02-01 MED ORDER — DOCUSATE SODIUM 100 MG PO CAPS
100.0000 mg | ORAL_CAPSULE | Freq: Two times a day (BID) | ORAL | Status: DC
  Administered 2023-02-01 – 2023-02-02 (×2): 100 mg via ORAL
  Filled 2023-02-01 (×2): qty 1

## 2023-02-01 NOTE — Progress Notes (Signed)
Mobility Specialist - Progress Note   02/01/23 1033  Therapy Vitals  BP 125/81  Mobility  Activity Ambulated with assistance in hallway  Level of Assistance Modified independent, requires aide device or extra time  Assistive Device Front wheel walker (doesnt need it)  Distance Ambulated (ft) 750 ft  Range of Motion/Exercises Active  Activity Response Tolerated well  Mobility Referral Yes  $Mobility charge 1 Mobility  Mobility Specialist Start Time (ACUTE ONLY) 1015  Mobility Specialist Stop Time (ACUTE ONLY) 1027  Mobility Specialist Time Calculation (min) (ACUTE ONLY) 12 min   Pt received in bed and agreed to mobility. Had no issues throughout session, returned to chair with all needs met.  Marilynne Halsted Mobility Specialist

## 2023-02-01 NOTE — Plan of Care (Signed)
  Problem: Education: Goal: Knowledge of General Education information will improve Description: Including pain rating scale, medication(s)/side effects and non-pharmacologic comfort measures Outcome: Progressing   Problem: Health Behavior/Discharge Planning: Goal: Ability to manage health-related needs will improve Outcome: Progressing   Problem: Clinical Measurements: Goal: Ability to maintain clinical measurements within normal limits will improve Outcome: Progressing Goal: Will remain free from infection Outcome: Progressing Goal: Diagnostic test results will improve Outcome: Progressing   Problem: Activity: Goal: Risk for activity intolerance will decrease Outcome: Progressing   Problem: Elimination: Goal: Will not experience complications related to bowel motility Outcome: Progressing Goal: Will not experience complications related to urinary retention Outcome: Progressing   Problem: Pain Managment: Goal: General experience of comfort will improve Outcome: Progressing   Problem: Safety: Goal: Ability to remain free from injury will improve Outcome: Progressing   Problem: Skin Integrity: Goal: Risk for impaired skin integrity will decrease Outcome: Progressing

## 2023-02-01 NOTE — Progress Notes (Signed)
PROGRESS NOTE    Lawrence Bailey  QVZ:[id number] DOB: 1941-04-02 DOA: 01/31/2023 PCP: Dorothyann Peng, MD  Outpatient Specialists:     Brief Narrative:  Patient is an 82 year old male with past medical history significant for BPH, CAD, malignant GIST, glaucoma, hypertension, prostate cancer, hypertensive nephropathy, CKD, normocytic anemia, syncope, pulmonary contusion, MGUS, left temporal headache, paroxysmal atrial fibrillation on apixaban.  Patient presented to the emergency department with complaints of abdominal pain, fever, chills, malaise and decreased appetite for the past 3 days.  There was also associated left flank pain, mild dyspnea, dysuria, night sweats and difficulty sleeping.  Patient was treated for UTI about 3 weeks prior to presentation that grew E. coli.    02/01/2023: Seen alongside patient's wife.  Vitals are stable.  Renal panel done earlier today revealed sodium of 132, potassium of 3.5, chloride of 90, CO2 of 25, BUN of 31, serum creatinine of 1.27, blood sugar 117 and albumin of 2.3.  Serum creatinine has improved from 1.59-1.27.  Serum creatinine is back to baseline.  Patient has baseline chronic kidney disease stage IIIa.  Suspect AKI is likely prerenal.  Improvement in leukocytosis is noted (10.8-8.5).  Hemoglobin of 9.2 g/dL.  Urine cultures growing E. coli.  Overall, patient feels better today.   Assessment & Plan:   Principal Problem:   Acute lower UTI (urinary tract infection) Active Problems:   Stage 3 chronic kidney disease (HCC)   Essential hypertension   Hyperglycemia   Glaucoma   Atherosclerosis of aorta (HCC)   Paroxysmal atrial fibrillation (HCC)   BMI 26.0-26.9,adult   Normocytic anemia   Moderate protein malnutrition (HCC)   Hyponatremia   Hypokalemia   Thrombocytopenia (HCC)   Hyperlipidemia   Grade II diastolic dysfunction    Acute lower UTI (urinary tract infection) -Urine culture is growing E. coli. -Follow-up final culture  results. -Continue IV Rocephin. -Follow-up blood culture results.   Acute kidney injury on chronic kidney disease stage IIIa:  -Suspect AKI is prerenal. -AKI has resolved. -Renal function is back to baseline. -Continue to monitor renal function and electrolytes.       Essential hypertension Low threshold to restart valsartan. -May continue to hold HCTZ. -Monitor blood pressure closely. -Goal blood pressure should be less than 130/80 mmHg.       Hyperglycemia -Monitor closely and optimize. -Check A1c in the morning.     Glaucoma Continue Alphagan and Xalatan drops. Follow-up with ophthalmology as an outpatient.     Atherosclerosis of aorta (HCC)   Hyperlipidemia  Continue atorvastatin 10 mg p.o. daily.     Paroxysmal atrial fibrillation (HCC) CHA?DS?-VASc Score of 6. Rate is controlled. Keep electrolytes optimized. Continue apixaban 5 mg p.o. twice daily.     Grade II diastolic dysfunction  -No CHF symptoms.     Hyponatremia -Slowly improving. -Likely multifactorial. -Cautious hydration.       Hypokalemia -Improving. -Potassium is 3.5 today. -Continue to monitor and replete.     Thrombocytopenia (HCC) Monitor platelet count.     Normocytic anemia Monitor hematocrit and hemoglobin. Transfuse as needed.     Moderate protein malnutrition (HCC) Protein supplementation. Consider nutritional services evaluation.     BMI 26.0-26.9,adult Overweight. Current BMI 26.61 kg/m. Follow-up with PCP.     DVT prophylaxis: Apixaban 5 Mg p.o. twice daily Code Status: Full code Family Communication: Wife. Disposition Plan: Eventually.   Consultants:  None.  Procedures:  None.  Antimicrobials:  IV Rocephin.   Subjective: -No new complaints. -No fever or chills. -  No shortness of breath. -Dysuria is improving. -No nausea or vomiting. -No CVA pain    Objective: Vitals:   02/01/23 0125 02/01/23 0538 02/01/23 1033 02/01/23 1421  BP: 120/73 124/82  125/81 108/67  Pulse: 62 81  72  Resp: 14 14  12   Temp: 98.1 F (36.7 C) 98.3 F (36.8 C)  98.6 F (37 C)  TempSrc: Oral Oral  Oral  SpO2: 100% 99%  97%  Weight:      Height:        Intake/Output Summary (Last 24 hours) at 02/01/2023 1613 Last data filed at 02/01/2023 0500 Gross per 24 hour  Intake 400 ml  Output 525 ml  Net -125 ml   Filed Weights   01/31/23 0954 01/31/23 1716  Weight: 79.4 kg 78.9 kg    Examination:  General exam: Appears calm and comfortable  Respiratory system: Clear to auscultation.  Cardiovascular system: S1 & S2 heard Gastrointestinal system: Abdomen is soft and nontender.  Central nervous system: Awake and alert.  Patient moves all extremities.   Extremities: No leg edema.  Data Reviewed: I have personally reviewed following labs and imaging studies  CBC: Recent Labs  Lab 01/31/23 1045 02/01/23 0527  WBC 10.8* 8.5  NEUTROABS 9.7*  --   HGB 10.7* 9.2*  HCT 32.9* 29.0*  MCV 85.5 86.1  PLT 135* 132*   Basic Metabolic Panel: Recent Labs  Lab 01/31/23 1045 02/01/23 0527  NA 131* 132*  K 3.2* 3.5  CL 95* 98  CO2 26 25  GLUCOSE 152* 117*  BUN 33* 31*  CREATININE 1.59* 1.27*  CALCIUM 8.8* 8.4*   GFR: Estimated Creatinine Clearance: 43.4 mL/min (A) (by C-G formula based on SCr of 1.27 mg/dL (H)). Liver Function Tests: Recent Labs  Lab 01/31/23 1045 02/01/23 0527  AST 27 20  ALT 29 23  ALKPHOS 66 55  BILITOT 1.0 0.6  PROT 8.2* 6.9  ALBUMIN 2.8* 2.3*   No results for input(s): "LIPASE", "AMYLASE" in the last 168 hours. No results for input(s): "AMMONIA" in the last 168 hours. Coagulation Profile: No results for input(s): "INR", "PROTIME" in the last 168 hours. Cardiac Enzymes: No results for input(s): "CKTOTAL", "CKMB", "CKMBINDEX", "TROPONINI" in the last 168 hours. BNP (last 3 results) No results for input(s): "PROBNP" in the last 8760 hours. HbA1C: No results for input(s): "HGBA1C" in the last 72 hours. CBG: No  results for input(s): "GLUCAP" in the last 168 hours. Lipid Profile: No results for input(s): "CHOL", "HDL", "LDLCALC", "TRIG", "CHOLHDL", "LDLDIRECT" in the last 72 hours. Thyroid Function Tests: No results for input(s): "TSH", "T4TOTAL", "FREET4", "T3FREE", "THYROIDAB" in the last 72 hours. Anemia Panel: No results for input(s): "VITAMINB12", "FOLATE", "FERRITIN", "TIBC", "IRON", "RETICCTPCT" in the last 72 hours. Urine analysis:    Component Value Date/Time   COLORURINE AMBER (A) 01/31/2023 1250   APPEARANCEUR TURBID (A) 01/31/2023 1250   LABSPEC 1.014 01/31/2023 1250   PHURINE 5.0 01/31/2023 1250   GLUCOSEU NEGATIVE 01/31/2023 1250   HGBUR LARGE (A) 01/31/2023 1250   BILIRUBINUR NEGATIVE 01/31/2023 1250   BILIRUBINUR Small 12/28/2022 1625   KETONESUR NEGATIVE 01/31/2023 1250   PROTEINUR 100 (A) 01/31/2023 1250   UROBILINOGEN 1.0 12/28/2022 1625   UROBILINOGEN 0.2 02/28/2018 1819   NITRITE NEGATIVE 01/31/2023 1250   LEUKOCYTESUR LARGE (A) 01/31/2023 1250   Sepsis Labs: @LABRCNTIP (procalcitonin:4,lacticidven:4)  ) Recent Results (from the past 240 hour(s))  Urine Culture     Status: Abnormal (Preliminary result)   Collection Time: 01/31/23  2:16  PM   Specimen: Urine, Clean Catch  Result Value Ref Range Status   Specimen Description   Final    URINE, CLEAN CATCH Performed at [facility], [address], [place], [place] [address]    Special Requests   Final    Immunocompromised Performed at [facility], [address], [place], [place] [address]    Culture (A)  Final    >=100,000 COLONIES/mL ESCHERICHIA COLI SUSCEPTIBILITIES TO FOLLOW Performed at [facility], [address], [place], [place] [address]    Report Status PENDING  Incomplete         Radiology Studies: ECHOCARDIOGRAM COMPLETE  Result Date: 02/01/2023    ECHOCARDIOGRAM REPORT   Patient Name:   KAVELL SAUNDER Date of Exam: 02/01/2023 Medical Rec #:   [id number]       Height:       68.0 in Accession #:    [id number]      Weight:       175.0 lb Date of Birth:  Dec 12, 1940       BSA:          1.931 m Patient Age:    81 years        BP:           124/82 mmHg Patient Gender: M               HR:           79 bpm. Exam Location:  Inpatient Procedure: 2D Echo, Color Doppler and Cardiac Doppler Indications:    CHF, Afib  History:        Patient has prior history of Echocardiogram examinations, most                 recent 10/11/2020. CHF, CKD, Arrythmias:Atrial Fibrillation; Risk                 Factors:Dyslipidemia and Hypertension.  Sonographer:    Milbert Coulter Referring Phys: [id number] DAVID MANUEL ORTIZ IMPRESSIONS  1. Left ventricular ejection fraction, by estimation, is 55 to 60%. The left ventricle has normal function. The left ventricle has no regional wall motion abnormalities. There is mild left ventricular hypertrophy. Left ventricular diastolic parameters are consistent with Grade I diastolic dysfunction (impaired relaxation).  2. Right ventricular systolic function is normal. The right ventricular size is normal. Tricuspid regurgitation signal is inadequate for assessing PA pressure.  3. A small pericardial effusion is present.  4. The mitral valve is normal in structure. No evidence of mitral valve regurgitation.  5. Aortic valve regurgitation is mild to moderate. Aortic valve sclerosis/calcification is present, without any evidence of aortic stenosis.  6. The inferior vena cava is normal in size with greater than 50% respiratory variability, suggesting right atrial pressure of 3 mmHg. FINDINGS  Left Ventricle: Left ventricular ejection fraction, by estimation, is 55 to 60%. The left ventricle has normal function. The left ventricle has no regional wall motion abnormalities. The left ventricular internal cavity size was normal in size. There is  mild left ventricular hypertrophy. Left ventricular diastolic parameters are consistent with Grade I diastolic  dysfunction (impaired relaxation). Right Ventricle: The right ventricular size is normal. Right ventricular systolic function is normal. Tricuspid regurgitation signal is inadequate for assessing PA pressure. Left Atrium: Left atrial size was normal in size. Right Atrium: Right atrial size was normal in size. Pericardium: A small pericardial effusion is present. Mitral Valve: The mitral valve is normal in structure.  No evidence of mitral valve regurgitation. Tricuspid Valve: The tricuspid valve is normal in structure. Tricuspid valve regurgitation is not demonstrated. Aortic Valve: Aortic valve regurgitation is mild to moderate. Aortic regurgitation PHT measures 395 msec. Aortic valve sclerosis/calcification is present, without any evidence of aortic stenosis. Aortic valve mean gradient measures 8.0 mmHg. Aortic valve  peak gradient measures 14.0 mmHg. Aortic valve area, by VTI measures 2.12 cm. Pulmonic Valve: Pulmonic valve regurgitation is mild. Aorta: The aortic root and ascending aorta are structurally normal, with no evidence of dilitation. Venous: The inferior vena cava is normal in size with greater than 50% respiratory variability, suggesting right atrial pressure of 3 mmHg. IAS/Shunts: No atrial level shunt detected by color flow Doppler.  LEFT VENTRICLE PLAX 2D LVIDd:         4.70 cm   Diastology LVIDs:         3.30 cm   LV e' medial:    4.90 cm/s LV PW:         1.30 cm   LV E/e' medial:  13.7 LV IVS:        1.20 cm   LV e' lateral:   7.51 cm/s LVOT diam:     2.10 cm   LV E/e' lateral: 9.0 LV SV:         68 LV SV Index:   35 LVOT Area:     3.46 cm  RIGHT VENTRICLE RV Basal diam:  3.40 cm RV Mid diam:    2.10 cm RV S prime:     15.80 cm/s TAPSE (M-mode): 2.1 cm LEFT ATRIUM             Index        RIGHT ATRIUM           Index LA diam:        4.40 cm 2.28 cm/m   RA Area:     17.90 cm LA Vol (A2C):   61.0 ml 31.59 ml/m  RA Volume:   43.00 ml  22.27 ml/m LA Vol (A4C):   42.8 ml 22.16 ml/m LA Biplane  Vol: 54.7 ml 28.33 ml/m  AORTIC VALVE AV Area (Vmax):    2.07 cm AV Area (Vmean):   2.04 cm AV Area (VTI):     2.12 cm AV Vmax:           187.00 cm/s AV Vmean:          128.000 cm/s AV VTI:            0.318 m AV Peak Grad:      14.0 mmHg AV Mean Grad:      8.0 mmHg LVOT Vmax:         112.00 cm/s LVOT Vmean:        75.400 cm/s LVOT VTI:          0.195 m LVOT/AV VTI ratio: 0.61 AI PHT:            395 msec  AORTA Ao Root diam: 3.40 cm MITRAL VALVE MV Area (PHT): 3.83 cm    SHUNTS MV Decel Time: 198 msec    Systemic VTI:  0.20 m MV E velocity: 67.30 cm/s  Systemic Diam: 2.10 cm MV A velocity: 90.00 cm/s MV E/A ratio:  0.75 Carolan Clines Electronically signed by Carolan Clines Signature Date/Time: 02/01/2023/11:46:34 AM    Final    CT ABDOMEN PELVIS W CONTRAST  Result Date: 01/31/2023 CLINICAL DATA:  Acute generalized abdominal pain. EXAM: CT ABDOMEN AND PELVIS  WITH CONTRAST TECHNIQUE: Multidetector CT imaging of the abdomen and pelvis was performed using the standard protocol following bolus administration of intravenous contrast. RADIATION DOSE REDUCTION: This exam was performed according to the departmental dose-optimization program which includes automated exposure control, adjustment of the mA and/or kV according to patient size and/or use of iterative reconstruction technique. CONTRAST:  OMNIPAQUE IOHEXOL 300 MG/ML  SOLN COMPARISON:  May 12, 2018. FINDINGS: Lower chest: No acute abnormality. Hepatobiliary: No focal liver abnormality is seen. No gallstones, gallbladder wall thickening, or biliary dilatation. Pancreas: Unremarkable. No pancreatic ductal dilatation or surrounding inflammatory changes. Spleen: Normal in size without focal abnormality. Adrenals/Urinary Tract: Adrenal glands appear normal. Stable large bilateral renal cysts are noted for which no further follow-up is required. Small nonobstructive left renal calculus is noted. No hydronephrosis or renal obstruction is noted. Urinary bladder  is decompressed. Possible bladder wall thickening is noted suggesting cystitis. Ill-defined areas of decreased enhancement are seen involving the left renal cortex concerning for pyelonephritis. Stomach/Bowel: Stomach is within normal limits. Appendix appears normal. No evidence of bowel wall thickening, distention, or inflammatory changes. Vascular/Lymphatic: Aortic atherosclerosis. No enlarged abdominal or pelvic lymph nodes. Reproductive: Stable moderate prostatic enlargement is noted. Other: No hernia is noted. Small amount of free fluid is noted posteriorly in the pelvis of indeterminate etiology. Musculoskeletal: No acute or significant osseous findings. IMPRESSION: Ill-defined areas of decreased enhancement are seen involving the left renal cortex concerning for left-sided pyelonephritis. Urinary bladder is nondistended, but mild bladder wall thickening is noted. This may be due to lack of distension, but cystitis cannot be excluded. Stable bilateral renal cysts are noted for which no further follow-up is required. Small nonobstructive left renal calculus. Stable moderate prostatic enlargement. Small amount of free fluid is noted posteriorly in the pelvis of indeterminate etiology, but potentially may be related to previously described inflammation. Aortic Atherosclerosis (ICD10-I70.0). Electronically Signed   By: Lupita Raider M.D.   On: 01/31/2023 12:35   DG Chest Port 1 View  Result Date: 01/31/2023 CLINICAL DATA:  Shortness of breath. EXAM: PORTABLE CHEST 1 VIEW COMPARISON:  February 28, 2018. FINDINGS: Stable cardiomediastinal silhouette. Both lungs are clear. The visualized skeletal structures are unremarkable. IMPRESSION: No active disease. Electronically Signed   By: Lupita Raider M.D.   On: 01/31/2023 10:57        Scheduled Meds:  amLODipine  10 mg Oral Daily   apixaban  5 mg Oral BID   atorvastatin  10 mg Oral Once per day on Monday Tuesday Wednesday Thursday Friday   brimonidine  1  drop Both Eyes BID   finasteride  5 mg Oral Daily   latanoprost  1 drop Both Eyes QHS   sildenafil  20 mg Oral Daily   tamsulosin  0.4 mg Oral Daily   Continuous Infusions:  cefTRIAXone (ROCEPHIN)  IV 1 g (02/01/23 1041)     LOS: 1 day    Time spent: 55 minutes.    Berton Mount, MD  [facility] Pager #: [phone] 7PM-7AM contact night coverage as above

## 2023-02-02 ENCOUNTER — Telehealth: Payer: Self-pay

## 2023-02-02 DIAGNOSIS — N390 Urinary tract infection, site not specified: Secondary | ICD-10-CM | POA: Diagnosis not present

## 2023-02-02 LAB — CBC WITH DIFFERENTIAL/PLATELET
Abs Immature Granulocytes: 0.05 10*3/uL (ref 0.00–0.07)
Basophils Absolute: 0 10*3/uL (ref 0.0–0.1)
Basophils Relative: 0 %
Eosinophils Absolute: 0 10*3/uL (ref 0.0–0.5)
Eosinophils Relative: 1 %
HCT: 31.4 % — ABNORMAL LOW (ref 39.0–52.0)
Hemoglobin: 10 g/dL — ABNORMAL LOW (ref 13.0–17.0)
Immature Granulocytes: 1 %
Lymphocytes Relative: 12 %
Lymphs Abs: 0.9 10*3/uL (ref 0.7–4.0)
MCH: 27.4 pg (ref 26.0–34.0)
MCHC: 31.8 g/dL (ref 30.0–36.0)
MCV: 86 fL (ref 80.0–100.0)
Monocytes Absolute: 0.8 10*3/uL (ref 0.1–1.0)
Monocytes Relative: 10 %
Neutro Abs: 5.8 10*3/uL (ref 1.7–7.7)
Neutrophils Relative %: 76 %
Platelets: 156 10*3/uL (ref 150–400)
RBC: 3.65 MIL/uL — ABNORMAL LOW (ref 4.22–5.81)
RDW: 14 % (ref 11.5–15.5)
WBC: 7.6 10*3/uL (ref 4.0–10.5)
nRBC: 0 % (ref 0.0–0.2)

## 2023-02-02 LAB — URINE CULTURE: Culture: >=100000 — AB

## 2023-02-02 LAB — RENAL FUNCTION PANEL
Albumin: 2.3 g/dL — ABNORMAL LOW (ref 3.5–5.0)
Anion gap: 7 (ref 5–15)
BUN: 31 mg/dL — ABNORMAL HIGH (ref 8–23)
CO2: 26 mmol/L (ref 22–32)
Calcium: 8.4 mg/dL — ABNORMAL LOW (ref 8.9–10.3)
Chloride: 98 mmol/L (ref 98–111)
Creatinine, Ser: 1.52 mg/dL — ABNORMAL HIGH (ref 0.61–1.24)
GFR, Estimated: 45 mL/min — ABNORMAL LOW (ref >60)
Glucose, Bld: 122 mg/dL — ABNORMAL HIGH (ref 70–99)
Phosphorus: 2.2 mg/dL — ABNORMAL LOW (ref 2.5–4.6)
Potassium: 3.4 mmol/L — ABNORMAL LOW (ref 3.5–5.1)
Sodium: 131 mmol/L — ABNORMAL LOW (ref 135–145)

## 2023-02-02 LAB — MAGNESIUM: Magnesium: 2.2 mg/dL (ref 1.7–2.4)

## 2023-02-02 MED ORDER — POTASSIUM CHLORIDE 20 MEQ PO PACK
20.0000 meq | PACK | Freq: Once | ORAL | Status: AC
  Administered 2023-02-02: 20 meq via ORAL
  Filled 2023-02-02: qty 1

## 2023-02-02 MED ORDER — POLYETHYLENE GLYCOL 3350 17 G PO PACK: 17.0000 g | PACK | Freq: Every day | ORAL | 0 refills | Status: AC

## 2023-02-02 MED ORDER — CIPROFLOXACIN HCL 250 MG PO TABS: 250.0000 mg | ORAL_TABLET | Freq: Two times a day (BID) | ORAL | 0 refills | Status: AC

## 2023-02-02 MED ORDER — SILDENAFIL CITRATE 20 MG PO TABS: 20.0000 mg | ORAL_TABLET | Freq: Every day | ORAL | 0 refills | Status: AC

## 2023-02-02 MED ORDER — VALSARTAN 40 MG PO TABS: 40.0000 mg | ORAL_TABLET | Freq: Every day | ORAL | 1 refills | Status: DC

## 2023-02-02 MED ORDER — K PHOS MONO-SOD PHOS DI & MONO 155-852-130 MG PO TABS: 250.0000 mg | ORAL_TABLET | Freq: Two times a day (BID) | ORAL | 0 refills | Status: AC

## 2023-02-02 NOTE — Progress Notes (Signed)
   02/01/23 2200  BiPAP/CPAP/SIPAP  BiPAP/CPAP/SIPAP Pt Type Adult  BiPAP/CPAP/SIPAP Resmed (Home unit)  Mask Type Full face mask  Respiratory Rate 18 breaths/min  FiO2 (%) 21 %  Patient Home Equipment Yes (Unit/Mask/Hose)  Auto Titrate Yes  CPAP/SIPAP surface wiped down Yes  Safety Check Completed by RT for Home Unit Yes, no issues noted   Pt. able to place on Independently, currently on R/A, made aware to notify if needed.

## 2023-02-02 NOTE — Discharge Summary (Signed)
Physician Discharge Summary  Patient ID: KALEY MANJARRES MRN: [id number] DOB/AGE: September 21, 1940 82 y.o.  Admit date: 01/31/2023 Discharge date: 02/02/2023  Admission Diagnoses:  Discharge Diagnoses:  Principal Problem:   Acute lower UTI (urinary tract infection) Active Problems:   Stage 3 chronic kidney disease (HCC)   Essential hypertension   Hyperglycemia   Glaucoma   Atherosclerosis of aorta (HCC)   Paroxysmal atrial fibrillation (HCC)   BMI 26.0-26.9,adult   Normocytic anemia   Moderate protein malnutrition (HCC)   Hyponatremia   Hypokalemia   Thrombocytopenia (HCC)   Hyperlipidemia   Grade II diastolic dysfunction   Nephrolithiasis   BPH   Recurrent UTI   Pyelonephritis   Hypophosphatemia.   Discharged Condition: stable  Hospital Course:  Patient is an 82 year old male with past medical history significant for BPH, CAD, malignant GIST, glaucoma, hypertension, prostate cancer, hypertensive nephropathy, CKD, normocytic anemia, syncope, pulmonary contusion, MGUS, left temporal headache and paroxysmal atrial fibrillation on apixaban.  Patient presented to the emergency department with complaints of abdominal pain, fever, chills, malaise, left flank pain, dysuria, mild dyspnea, night sweats, difficulty sleeping and decreased appetite.  Patient was admitted and managed for UTI/pyelonephritis secondary to E. coli.  Apparently, patient had UTI secondary to E. coli about a month ago.  CT scan of the abdomen and pelvis revealed nonobstructing nephrolithiasis.  Patient follows up with urology.  Liberal oral fluid.  Follow-up with PCP and urology team on discharge.  Patient was managed with IV Rocephin.  Patient will also be discharged on oral ciprofloxacin for the next 4 days.    Acute kidney injury on chronic kidney disease stage IIIa:  -Suspect AKI is prerenal. -AKI has resolved. -Renal function is back to baseline. -Continue to monitor renal function and electrolytes.        Essential hypertension -Start valsartan at a lower dose, 40 Mg p.o. once daily.   -May continue to hold HCTZ. -Monitor blood pressure closely. -Goal blood pressure should be less than 130/80 mmHg.       Hyperglycemia -Monitor closely and optimize. -Please check A1c.       Glaucoma Continue Alphagan and Xalatan drops. Follow-up with ophthalmology as an outpatient.     Atherosclerosis of aorta (HCC)   Hyperlipidemia  Continue atorvastatin 10 mg p.o. daily.     Paroxysmal atrial fibrillation (HCC) CHA?DS?-VASc Score of 6. Rate is controlled. Keep electrolytes optimized. Continue apixaban 5 mg p.o. twice daily.     Grade II diastolic dysfunction  -No CHF symptoms.     Hyponatremia -Slowly improving. -Likely multifactorial (prerenal and SIADH). -Cautious hydration.       Hypokalemia -Improving. -Potassium is 3.5 today. -Continue to monitor and replete.  Hypophosphatemia: -Mild. -Phosphorus of 2.2.  To discharge.     Thrombocytopenia (HCC) Monitor platelet count.     Normocytic anemia Monitor hematocrit and hemoglobin. Transfuse as needed.     Moderate protein malnutrition (HCC) Protein supplementation. Consider nutritional services evaluation.     BMI 26.0-26.9,adult Overweight. Current BMI 26.61 kg/m. Follow-up with PCP.    Consults: None  Significant Diagnostic Studies:  Urine culture grew: -E. coli.   CT ABDOMEN AND PELVIS WITH CONTRAST revealed:   TECHNIQUE: Multidetector CT imaging of the abdomen and pelvis was performed using the standard protocol following bolus administration of intravenous contrast.   RADIATION DOSE REDUCTION: This exam was performed according to the departmental dose-optimization program which includes automated exposure control, adjustment of the mA and/or kV according to patient size and/or use of  iterative reconstruction technique.   CONTRAST:  OMNIPAQUE IOHEXOL 300 MG/ML  SOLN   COMPARISON:  May 12, 2018.   FINDINGS: Lower chest: No acute abnormality.   Hepatobiliary: No focal liver abnormality is seen. No gallstones, gallbladder wall thickening, or biliary dilatation.   Pancreas: Unremarkable. No pancreatic ductal dilatation or surrounding inflammatory changes.   Spleen: Normal in size without focal abnormality.   Adrenals/Urinary Tract: Adrenal glands appear normal. Stable large bilateral renal cysts are noted for which no further follow-up is required. Small nonobstructive left renal calculus is noted. No hydronephrosis or renal obstruction is noted. Urinary bladder is decompressed. Possible bladder wall thickening is noted suggesting cystitis. Ill-defined areas of decreased enhancement are seen involving the left renal cortex concerning for pyelonephritis.   Stomach/Bowel: Stomach is within normal limits. Appendix appears normal. No evidence of bowel wall thickening, distention, or inflammatory changes.   Vascular/Lymphatic: Aortic atherosclerosis. No enlarged abdominal or pelvic lymph nodes.   Reproductive: Stable moderate prostatic enlargement is noted.   Other: No hernia is noted. Small amount of free fluid is noted posteriorly in the pelvis of indeterminate etiology.   Musculoskeletal: No acute or significant osseous findings.   IMPRESSION: Ill-defined areas of decreased enhancement are seen involving the left renal cortex concerning for left-sided pyelonephritis.   Urinary bladder is nondistended, but mild bladder wall thickening is noted. This may be due to lack of distension, but cystitis cannot be excluded.   Stable bilateral renal cysts are noted for which no further follow-up is required.   Small nonobstructive left renal calculus.   Stable moderate prostatic enlargement.   Small amount of free fluid is noted posteriorly in the pelvis of indeterminate etiology, but potentially may be related to previously described inflammation.   Aortic  Atherosclerosis (ICD10-I70.0).     Electronically Signed   By: Lupita Raider M.D.   On: 01/31/2023 12:35  Chest x-ray revealed: -No acute cardiopulmonary process.  Treatments: antibiotics: Rocephin during the hospital stay.  Discharge Exam: Blood pressure 126/83, pulse 72, temperature 98 F (36.7 C), temperature source Oral, resp. rate 18, height 5\' 8"  (1.727 m), weight 78.9 kg, SpO2 100%.   Disposition: Discharge disposition: 01-Home or Self Care       Discharge Instructions     Diet - low sodium heart healthy   Complete by: As directed    Increase activity slowly   Complete by: As directed       Allergies as of 02/02/2023       Reactions   Other    Bee sting   Sucralfate Other (See Comments)        Medication List     STOP taking these medications    doxycycline 100 MG tablet Commonly known as: VIBRA-TABS   famotidine 20 MG tablet Commonly known as: PEPCID   OVER THE COUNTER MEDICATION   valsartan-hydrochlorothiazide 320-25 MG tablet Commonly known as: DIOVAN-HCT       TAKE these medications    amLODipine 10 MG tablet Commonly known as: NORVASC Take 10 mg by mouth daily.   apixaban 5 MG Tabs tablet Commonly known as: ELIQUIS Take 1 tablet (5 mg total) by mouth 2 (two) times daily.   atorvastatin 10 MG tablet Commonly known as: LIPITOR TAKE 1 TABLET BY MOUTH MONDAY - FRIDAY AT BEDTIME What changed: See the new instructions.   brimonidine 0.2 % ophthalmic solution Commonly known as: ALPHAGAN 1 drop 2 (two) times daily.   ciprofloxacin 250 MG tablet Commonly  known as: Cipro Take 1 tablet (250 mg total) by mouth 2 (two) times daily for 4 days.   docusate sodium 100 MG capsule Commonly known as: COLACE Take 100 mg by mouth daily as needed for mild constipation.   finasteride 5 MG tablet Commonly known as: PROSCAR Take 5 mg by mouth daily.   fluticasone 50 MCG/ACT nasal spray Commonly known as: FLONASE INHALE 2 SPRAYS INTO  EACH NOSTRIL EVERY NIGHT What changed: See the new instructions.   latanoprost 0.005 % ophthalmic solution Commonly known as: XALATAN Place 1 drop into both eyes at bedtime.   phosphorus 155-852-130 MG tablet Commonly known as: K PHOS NEUTRAL Take 1 tablet (250 mg total) by mouth 2 (two) times daily for 2 days.   polyethylene glycol 17 g packet Commonly known as: MIRALAX / GLYCOLAX Take 17 g by mouth daily. Start taking on: February 03, 2023   sildenafil 20 MG tablet Commonly known as: REVATIO Take 1 tablet (20 mg total) by mouth daily. What changed: when to take this   tamsulosin 0.4 MG Caps capsule Commonly known as: FLOMAX Take 0.4 mg by mouth daily.   valsartan 40 MG tablet Commonly known as: Diovan Take 1 tablet (40 mg total) by mouth daily.        Time spent: 35 minutes.   SignedBarnetta Chapel 02/02/2023, 1:48 PM

## 2023-02-02 NOTE — Telephone Encounter (Signed)
error 

## 2023-02-02 NOTE — TOC Initial Note (Signed)
Transition of Care [facility]) - Initial/Assessment Note    Patient Details  Name: Lawrence Bailey MRN: [id number] Date of Birth: 1940-11-09  Transition of Care [facility]) CM/SW Contact:    Beckie Busing, RN Phone Number:[phone]  02/02/2023, 2:44 PM  Clinical Narrative:                 TOC following patient with high risk for readmission. Patient is from with with his wife where he functions independently. Patient does have PCP(Sanders, Melina Schools, MD ) and follows on a regular basis. Patient has no DME & no home health services. Plan is for patient to return home with wife. There are currently no TOC needs. TOC will sign off.   Expected Discharge Plan: Home/Self Care Barriers to Discharge: No Barriers Identified   Patient Goals and CMS Choice Patient states their goals for this hospitalization and ongoing recovery are:: Wants to feel better and be able to go home CMS Medicare.gov Compare Post Acute Care list provided to::  (n/a) Choice offered to / list presented to : NA [place] ownership interest in [facility].provided to::  (n/a)    Expected Discharge Plan and Services In-house Referral: NA Discharge Planning Services: CM Consult Post Acute Care Choice: NA Living arrangements for the past 2 months: Single Family Home Expected Discharge Date: 02/02/23               DME Arranged: N/A DME Agency: NA       HH Arranged: NA HH Agency: NA        Prior Living Arrangements/Services Living arrangements for the past 2 months: Single Family Home Lives with:: Spouse Patient language and need for interpreter reviewed:: Yes Do you feel safe going back to the place where you live?: Yes      Need for Family Participation in Patient Care: No (Comment) Care giver support system in place?: Yes (comment) Current home services:  (n/a) Criminal Activity/Legal Involvement Pertinent to Current Situation/Hospitalization: No - Comment as needed  Activities of Daily Living Home  Assistive Devices/Equipment: Eyeglasses, Cane (specify quad or straight), Walker (specify type) ADL Screening (condition at time of admission) Patient's cognitive ability adequate to safely complete daily activities?: Yes Is the patient deaf or have difficulty hearing?: No Does the patient have difficulty seeing, even when wearing glasses/contacts?: No Does the patient have difficulty concentrating, remembering, or making decisions?: No Patient able to express need for assistance with ADLs?: Yes Does the patient have difficulty dressing or bathing?: No Independently performs ADLs?: Yes (appropriate for developmental age) Does the patient have difficulty walking or climbing stairs?: No Weakness of Legs: None Weakness of Arms/Hands: None  Permission Sought/Granted Permission sought to share information with : Family Supports Permission granted to share information with : Yes, Verbal Permission Granted  Share Information with NAME: Lawrence Bailey     Permission granted to share info w Relationship: spouse  Permission granted to share info w Contact Information: [phone]  Emotional Assessment Appearance:: Appears stated age Attitude/Demeanor/Rapport: Gracious Affect (typically observed): Pleasant Orientation: : Oriented to Self, Oriented to Place, Oriented to  Time, Oriented to Situation Alcohol / Substance Use: Not Applicable Psych Involvement: No (comment)  Admission diagnosis:  Weakness [R53.1] Pyelonephritis [N12] Acute kidney injury (HCC) [N17.9] Acute lower UTI (urinary tract infection) [N39.0] Patient Active Problem List   Diagnosis Date Noted   Normocytic anemia 01/31/2023   Acute lower UTI (urinary tract infection) 01/31/2023   Moderate protein malnutrition (HCC) 01/31/2023   Hyponatremia 01/31/2023  Hypokalemia 01/31/2023   Thrombocytopenia (HCC) 01/31/2023   Hyperlipidemia 01/31/2023   Grade II diastolic dysfunction 01/31/2023   Generalized abdominal pain  12/30/2022   Early satiety 12/30/2022   Gross hematuria 12/30/2022   Acute cystitis with hematuria 12/30/2022   Anemia 12/30/2022   Left temporal headache 12/22/2022   Close exposure to COVID-19 virus 12/22/2022   BMI 26.0-26.9,adult 12/22/2022   Paroxysmal atrial fibrillation (HCC) 09/10/2022   Acquired thrombophilia (HCC) 09/10/2022   Falling episodes 12/12/2021   Bilateral impacted cerumen 07/27/2021   Paresthesia of both feet 07/27/2021   Fall (on) (from) other stairs and steps, initial encounter 07/27/2021   Recurrent falls 07/27/2021   Monoclonal gammopathy of unknown significance (MGUS) 07/27/2021   Chest pain 08/28/2020   Constipation 08/28/2020   Cough 08/28/2020   Dysphagia 08/28/2020   Hemorrhage of rectum and anus 08/28/2020   Atherosclerosis of aorta (HCC) 07/09/2020   Hypertensive nephropathy 05/02/2020   Malignant neoplasm of prostate (HCC) 07/11/2019   Glaucoma 02/28/2018   Syncope and collapse    Syncope 11/08/2016   Stage 3 chronic kidney disease (HCC) 11/08/2016   Essential hypertension 11/08/2016   Hyperglycemia 11/08/2016   Pulmonary contusion 02/04/2012   Thigh hematoma 02/04/2012   H/O malignant gastrointestinal stromal tumor (GIST) 07/20/2011   PCP:  Dorothyann Peng, MD Pharmacy:   [phone] - [place], [place] - [address] AT [facility] [address] [place] [address] Phone: [phone] Fax: [phone]     Social Determinants of Health (SDOH) Social History: SDOH Screenings   Food Insecurity: No Food Insecurity (01/31/2023)  Housing: Low Risk  (01/31/2023)  Transportation Needs: No Transportation Needs (01/31/2023)  Utilities: Not At Risk (01/31/2023)  Alcohol Screen: Low Risk  (12/28/2022)  Depression (PHQ2-9): Low Risk  (12/28/2022)  Financial Resource Strain: Low Risk  (12/28/2022)  Physical Activity: Inactive (12/28/2022)  Social Connections: Unknown (12/28/2022)  Recent Concern: Social  Connections - Moderately Isolated (12/10/2022)  Stress: No Stress Concern Present (12/28/2022)  Tobacco Use: Low Risk  (01/31/2023)  Health Literacy: Adequate Health Literacy (12/10/2022)   SDOH Interventions:     Readmission Risk Interventions    02/02/2023    2:01 PM  Readmission Risk Prevention Plan  Transportation Screening Complete  PCP or Specialist Appt within 3-5 Days Complete  HRI or Home Care Consult Complete  Social Work Consult for Recovery Care Planning/Counseling Complete  Medication Review Oceanographer) Complete

## 2023-02-02 NOTE — Progress Notes (Signed)
Mobility Specialist - Progress Note   02/02/23 1030  Mobility  Activity Ambulated independently in hallway  Level of Assistance Independent  Assistive Device None  Distance Ambulated (ft) 550 ft  Activity Response Tolerated well  Mobility Referral Yes  $Mobility charge 1 Mobility  Mobility Specialist Start Time (ACUTE ONLY) 1020  Mobility Specialist Stop Time (ACUTE ONLY) 1029  Mobility Specialist Time Calculation (min) (ACUTE ONLY) 9 min   Pt received in bed and agreeable to mobility. No complaints during session. Pt to bed after session with all needs met.     [facility]

## 2023-02-02 NOTE — TOC Transition Note (Signed)
Transition of Care [facility]) - CM/SW Discharge Note   Patient Details  Name: Lawrence Bailey MRN: [id number] Date of Birth: 27-Feb-1941  [facility]) CM/SW Contact:  Beckie Busing, RN Phone Number:[phone]  02/02/2023, 3:02 PM   Clinical Narrative:    Patient with discharge order. CM  spoke with patient at beside, No home health needs. TOC will sign off.    Final next level of care: Home/Self Care Barriers to Discharge: No Barriers Identified   Patient Goals and CMS Choice CMS Medicare.gov Compare Post Acute Care list provided to::  (n/a) Choice offered to / list presented to : NA  Discharge Placement                         Discharge Plan and Services Additional resources added to the After Visit Summary for   In-house Referral: NA Discharge Planning Services: CM Consult Post Acute Care Choice: NA          DME Arranged: N/A DME Agency: NA       HH Arranged: NA HH Agency: NA        Social Determinants of Health (SDOH) Interventions SDOH Screenings   Food Insecurity: No Food Insecurity (01/31/2023)  Housing: Low Risk  (01/31/2023)  Transportation Needs: No Transportation Needs (01/31/2023)  Utilities: Not At Risk (01/31/2023)  Alcohol Screen: Low Risk  (12/28/2022)  Depression (PHQ2-9): Low Risk  (12/28/2022)  Financial Resource Strain: Low Risk  (12/28/2022)  Physical Activity: Inactive (12/28/2022)  Social Connections: Unknown (12/28/2022)  Recent Concern: Social Connections - Moderately Isolated (12/10/2022)  Stress: No Stress Concern Present (12/28/2022)  Tobacco Use: Low Risk  (01/31/2023)  Health Literacy: Adequate Health Literacy (12/10/2022)     Readmission Risk Interventions    02/02/2023    2:01 PM  Readmission Risk Prevention Plan  Transportation Screening Complete  PCP or Specialist Appt within 3-5 Days Complete  HRI or Home Care Consult Complete  Social Work Consult for Recovery Care Planning/Counseling Complete  Medication Review Furniture conservator/restorer) Complete

## 2023-02-03 ENCOUNTER — Other Ambulatory Visit: Payer: Self-pay

## 2023-02-03 NOTE — Transitions of Care (Post Inpatient/ED Visit) (Signed)
02/03/2023  Name: Lawrence Bailey MRN: [id number] DOB: 24-Mar-1941  Today's TOC FU Call Status:   Patient's Name and Date of Birth confirmed.  Transition Care Management Follow-up Telephone Call Date of Discharge: 02/02/23 Discharge Facility: [facility]) Type of Discharge: Inpatient Admission Primary Inpatient Discharge Diagnosis:: Acute Lower UTI How have you been since you were released from the hospital?: Better Any questions or concerns?: No  Items Reviewed: Did you receive and understand the discharge instructions provided?: Yes Any new allergies since your discharge?: No Dietary orders reviewed?: NA Do you have support at home?: Yes People in Home: spouse Name of Support/Comfort Primary Source: Darla Lesches  Medications Reviewed Today: Medications Reviewed Today     Reviewed by Coolidge Breeze, CMA (Certified Medical Assistant) on 02/03/23 at 1647  Med List Status: <None>   Medication Order Taking? Sig Documenting Provider Last Dose Status Informant  amLODipine (NORVASC) 10 MG tablet [id number] Yes Take 10 mg by mouth daily. [provider] Taking Active Multiple Informants  apixaban (ELIQUIS) 5 MG TABS tablet [id number] Yes Take 1 tablet (5 mg total) by mouth 2 (two) times daily. Meriam Sprague, MD Taking Active Multiple Informants  atorvastatin (LIPITOR) 10 MG tablet [id number] Yes TAKE 1 TABLET BY MOUTH MONDAY - FRIDAY AT BEDTIME  Patient taking differently: Take 10 mg by mouth See admin instructions. Take 1 tablet by mouth Monday - Friday at bedtime   Dorothyann Peng, MD Taking Active Multiple Informants  brimonidine [facility]) 0.2 % ophthalmic solution [id number] Yes 1 drop 2 (two) times daily. [provider] Taking Active Multiple Informants  ciprofloxacin (CIPRO) 250 MG tablet [id number] Yes Take 1 tablet (250 mg total) by mouth 2 (two) times daily for 4 days. Barnetta Chapel, MD Taking Active   docusate sodium (COLACE) 100 MG  capsule [id number] Yes Take 100 mg by mouth daily as needed for mild constipation. [provider] Taking Active Multiple Informants  finasteride (PROSCAR) 5 MG tablet [id number] Yes Take 5 mg by mouth daily. [provider] Taking Active Multiple Informants  fluticasone (FLONASE) 50 MCG/ACT nasal spray [id number] Yes INHALE 2 SPRAYS INTO EACH NOSTRIL EVERY NIGHT  Patient taking differently: Place 2 sprays into both nostrils See admin instructions. INHALE 2 SPRAYS INTO EACH NOSTRIL EVERY NIGHT   Dorothyann Peng, MD Taking Active Multiple Informants  latanoprost (XALATAN) 0.005 % ophthalmic solution [id number] Yes Place 1 drop into both eyes at bedtime. [provider] Taking Active Multiple Informants  phosphorus (K PHOS NEUTRAL) 630-160-109 MG tablet [id number] Yes Take 1 tablet (250 mg total) by mouth 2 (two) times daily for 2 days. Barnetta Chapel, MD Taking Active   polyethylene glycol (MIRALAX / GLYCOLAX) 17 g packet [id number] Yes Take 17 g by mouth daily. Barnetta Chapel, MD Taking Active   sildenafil (REVATIO) 20 MG tablet [id number] Yes Take 1 tablet (20 mg total) by mouth daily. Barnetta Chapel, MD Taking Active   tamsulosin [facility]) 0.4 MG CAPS capsule [id number] Yes Take 0.4 mg by mouth daily. [provider] Taking Active Multiple Informants  valsartan (DIOVAN) 40 MG tablet [id number] Yes Take 1 tablet (40 mg total) by mouth daily. Barnetta Chapel, MD Taking Active             Home Care and Equipment/Supplies: Were Home Health Services Ordered?: No Any new equipment or medical supplies ordered?: No  Functional Questionnaire: Do you need assistance with bathing/showering or dressing?: No Do you need assistance with meal preparation?: No  Do you need assistance with eating?: No Do you have difficulty maintaining continence: No Do you need assistance with getting out of bed/getting out of a chair/moving?: No Do you have difficulty managing  or taking your medications?: No  Follow up appointments reviewed: PCP Follow-up appointment confirmed?: Yes Date of PCP follow-up appointment?: 02/09/23 Follow-up Provider: Dorothyann Peng Specialist [facility] Follow-up appointment confirmed?: NA Do you need transportation to your follow-up appointment?: No Do you understand care options if your condition(s) worsen?: Yes-patient verbalized understanding    SIGNATURE Randa Lynn, CMA

## 2023-02-04 ENCOUNTER — Other Ambulatory Visit: Payer: Self-pay | Admitting: Internal Medicine

## 2023-02-04 ENCOUNTER — Telehealth: Payer: Self-pay

## 2023-02-04 DIAGNOSIS — G8929 Other chronic pain: Secondary | ICD-10-CM

## 2023-02-04 MED ORDER — TRAMADOL HCL 50 MG PO TABS: 50.0000 mg | ORAL_TABLET | Freq: Two times a day (BID) | ORAL | 0 refills | Status: DC | PRN

## 2023-02-04 NOTE — Transitions of Care (Post Inpatient/ED Visit) (Signed)
02/04/2023  Name: Lawrence Bailey MRN: [id number] DOB: [birth year]  Today's TOC FU Call Status: Today's TOC FU Call Status:: Successful TOC FU Call Completed TOC FU Call Complete Date: 02/04/23 Patient's Name and Date of Birth confirmed.  Transition Care Management Follow-up Telephone Call Date of Discharge: 02/02/23 Discharge Facility: [facility]) Type of Discharge: Inpatient Admission Primary Inpatient Discharge Diagnosis:: "pyelonephritis" How have you been since you were released from the hospital?: Same (wife states pt doing about  the same-still having lower abd pain-possibly related to kidney stone. He is eating more and strength starting to come back some.) Any questions or concerns?: Yes Patient Questions/Concerns:: Wife voices she called PCP office yesterday to discuss pain mgmt as pt was getting IV Morphine in the hospital but sent home with no pain meds-and was supposed to get a call back from office but no one has called her back yet.Wife states pt has Tramdol in the home-from previous knee injury-has taken some since d/c-has two tabs left-wants to know if ok to take Patient Questions/Concerns Addressed: Other:, Notified Provider of Patient Questions/Concerns (RNCM contacted PCP office-left VM message for clinical staff regarding wife's concerns for pt needing something for pain and wanting PCP recommendations,advised staff that pt was taking old prescription of Tramadol)  Items Reviewed: Did you receive and understand the discharge instructions provided?: Yes Medications obtained,verified, and reconciled?: Yes (Medications Reviewed) (pt has Tramadol in the home-from previous knee injury-wants PCP approval to take med) Any new allergies since your discharge?: No Dietary orders reviewed?: Yes Type of Diet Ordered:: low salt/heart healthy Do you have support at home?: Yes People in Home: spouse Name of Support/Comfort Primary Source: [place]  Medications Reviewed  Today: Medications Reviewed Today     Reviewed by Charlyn Minerva, RN (Registered Nurse) on 02/04/23 at 1117  Med List Status: <None>   Medication Order Taking? Sig Documenting Provider Last Dose Status Informant  amLODipine (NORVASC) 10 MG tablet [id number] Yes Take 10 mg by mouth daily. [provider] Taking Active Multiple Informants  apixaban (ELIQUIS) 5 MG TABS tablet [id number] Yes Take 1 tablet (5 mg total) by mouth 2 (two) times daily. Meriam Sprague, MD Taking Active Multiple Informants  atorvastatin (LIPITOR) 10 MG tablet [id number] Yes TAKE 1 TABLET BY MOUTH MONDAY - FRIDAY AT BEDTIME  Patient taking differently: Take 10 mg by mouth See admin instructions. Take 1 tablet by mouth Monday - Friday at bedtime   Dorothyann Peng, MD Taking Active Multiple Informants  brimonidine [facility]) 0.2 % ophthalmic solution [id number] Yes 1 drop 2 (two) times daily. [provider] Taking Active Multiple Informants  ciprofloxacin (CIPRO) 250 MG tablet [id number] Yes Take 1 tablet (250 mg total) by mouth 2 (two) times daily for 4 days. Barnetta Chapel, MD Taking Active   docusate sodium (COLACE) 100 MG capsule [id number] Yes Take 100 mg by mouth daily as needed for mild constipation. [provider] Taking Active Multiple Informants  finasteride (PROSCAR) 5 MG tablet [id number] Yes Take 5 mg by mouth daily. [provider] Taking Active Multiple Informants  fluticasone (FLONASE) 50 MCG/ACT nasal spray [id number] Yes INHALE 2 SPRAYS INTO EACH NOSTRIL EVERY NIGHT  Patient taking differently: Place 2 sprays into both nostrils See admin instructions. INHALE 2 SPRAYS INTO EACH NOSTRIL EVERY NIGHT   Dorothyann Peng, MD Taking Active Multiple Informants  latanoprost (XALATAN) 0.005 % ophthalmic solution [id number] Yes Place 1 drop into both eyes at bedtime. [provider] Taking Active Multiple Informants  phosphorus (K PHOS NEUTRAL) 155-852-130 MG  tablet [id number] Yes Take 1 tablet (250 mg total) by mouth 2 (two) times daily for 2 days. Barnetta Chapel, MD Taking Active   polyethylene glycol (MIRALAX / GLYCOLAX) 17 g packet [id number] Yes Take 17 g by mouth daily. Barnetta Chapel, MD Taking Active   sildenafil (REVATIO) 20 MG tablet [id number] Yes Take 1 tablet (20 mg total) by mouth daily. Barnetta Chapel, MD Taking Active   tamsulosin [facility]) 0.4 MG CAPS capsule [id number] Yes Take 0.4 mg by mouth daily. [provider] Taking Active Multiple Informants  valsartan (DIOVAN) 40 MG tablet [id number] Yes Take 1 tablet (40 mg total) by mouth daily. Barnetta Chapel, MD Taking Active             Home Care and Equipment/Supplies: Were Home Health Services Ordered?: NA Any new equipment or medical supplies ordered?: NA  Functional Questionnaire: Do you need assistance with bathing/showering or dressing?: No Do you need assistance with meal preparation?: No Do you need assistance with eating?: No Do you have difficulty maintaining continence: No Do you need assistance with getting out of bed/getting out of a chair/moving?: No Do you have difficulty managing or taking your medications?: No  Follow up appointments reviewed: PCP Follow-up appointment confirmed?: Yes Date of PCP follow-up appointment?: 02/09/23 Follow-up Provider: Dr. Allyne Gee Specialist [facility] Follow-up appointment confirmed?: No (Pt was instructed to follow up with urologist-wife voices pt was being seen by Dr. Liliane Shi but does not want to see again-wants referral to new urologist and plans to talk with PCP regarding the matter at upcoming appt) Do you need transportation to your follow-up appointment?: No Do you understand care options if your condition(s) worsen?: Yes-patient verbalized understanding  SDOH Interventions Today    Flowsheet Row Most Recent Value  SDOH Interventions   Food Insecurity Interventions Intervention Not Indicated   Transportation Interventions Intervention Not Indicated      TOC Interventions Today    Flowsheet Row Most Recent Value  TOC Interventions   TOC Interventions Discussed/Reviewed TOC Interventions Discussed, Contacted provider for patient needs      Interventions Today    Flowsheet Row Most Recent Value  Chronic Disease   Chronic disease during today's visit Other  [prostate CA]  General Interventions   General Interventions Discussed/Reviewed General Interventions Discussed, Doctor Visits  Doctor Visits Discussed/Reviewed Doctor Visits Discussed, PCP, Specialist  PCP/Specialist Visits Compliance with follow-up visit  Education Interventions   Education Provided Provided Education  Provided Verbal Education On Nutrition, When to see the doctor, Medication, Other  [sx/pain mgmt]  Nutrition Interventions   Nutrition Discussed/Reviewed Nutrition Discussed, Fluid intake, Adding fruits and vegetables, Decreasing salt, Decreasing fats, Increasing proteins  Pharmacy Interventions   Pharmacy Dicussed/Reviewed Pharmacy Topics Discussed, Medications and their functions  Safety Interventions   Safety Discussed/Reviewed Safety Discussed       Alessandra Grout [facility] Care Management Care Management Community Coordinator Direct Phone: [phone] Toll Free: [phone] Fax: [phone]

## 2023-02-05 ENCOUNTER — Encounter: Payer: Self-pay | Admitting: *Deleted

## 2023-02-05 ENCOUNTER — Encounter: Payer: Self-pay | Admitting: Gastroenterology

## 2023-02-05 NOTE — Progress Notes (Signed)
Hospital d/c on 02/02/23. Scheduling message sent for lab/OV in early October.

## 2023-02-05 NOTE — Patient Instructions (Signed)
Urinary Tract Infection, Adult  A urinary tract infection (UTI) is an infection of any part of the urinary tract. The urinary tract includes the kidneys, ureters, bladder, and urethra. These organs make, store, and get rid of urine in the body. An upper UTI affects the ureters and kidneys. A lower UTI affects the bladder and urethra. What are the causes? Most urinary tract infections are caused by bacteria in your genital area around your urethra, where urine leaves your body. These bacteria grow and cause inflammation of your urinary tract. What increases the risk? You are more likely to develop this condition if: You have a urinary catheter that stays in place. You are not able to control when you urinate or have a bowel movement (incontinence). You are male and you: Use a spermicide or diaphragm for birth control. Have low estrogen levels. Are pregnant. You have certain genes that increase your risk. You are sexually active. You take antibiotic medicines. You have a condition that causes your flow of urine to slow down, such as: An enlarged prostate, if you are male. Blockage in your urethra. A kidney stone. A nerve condition that affects your bladder control (neurogenic bladder). Not getting enough to drink, or not urinating often. You have certain medical conditions, such as: Diabetes. A weak disease-fighting system (immunesystem). Sickle cell disease. Gout. Spinal cord injury. What are the signs or symptoms? Symptoms of this condition include: Needing to urinate right away (urgency). Frequent urination. This may include small amounts of urine each time you urinate. Pain or burning with urination. Blood in the urine. Urine that smells bad or unusual. Trouble urinating. Cloudy urine. Vaginal discharge, if you are male. Pain in the abdomen or the lower back. You may also have: Vomiting or a decreased appetite. Confusion. Irritability or tiredness. A fever or  chills. Diarrhea. The first symptom in older adults may be confusion. In some cases, they may not have any symptoms until the infection has worsened. How is this diagnosed? This condition is diagnosed based on your medical history and a physical exam. You may also have other tests, including: Urine tests. Blood tests. Tests for STIs (sexually transmitted infections). If you have had more than one UTI, a cystoscopy or imaging studies may be done to determine the cause of the infections. How is this treated? Treatment for this condition includes: Antibiotic medicine. Over-the-counter medicines to treat discomfort. Drinking enough water to stay hydrated. If you have frequent infections or have other conditions such as a kidney stone, you may need to see a health care provider who specializes in the urinary tract (urologist). In rare cases, urinary tract infections can cause sepsis. Sepsis is a life-threatening condition that occurs when the body responds to an infection. Sepsis is treated in the hospital with IV antibiotics, fluids, and other medicines. Follow these instructions at home:  Medicines Take over-the-counter and prescription medicines only as told by your health care provider. If you were prescribed an antibiotic medicine, take it as told by your health care provider. Do not stop using the antibiotic even if you start to feel better. General instructions Make sure you: Empty your bladder often and completely. Do not hold urine for long periods of time. Empty your bladder after sex. Wipe from front to back after urinating or having a bowel movement if you are male. Use each tissue only one time when you wipe. Drink enough fluid to keep your urine pale yellow. Keep all follow-up visits. This is important. Contact a health  care provider if: Your symptoms do not get better after 1-2 days. Your symptoms go away and then return. Get help right away if: You have severe pain in  your back or your lower abdomen. You have a fever or chills. You have nausea or vomiting. Summary A urinary tract infection (UTI) is an infection of any part of the urinary tract, which includes the kidneys, ureters, bladder, and urethra. Most urinary tract infections are caused by bacteria in your genital area. Treatment for this condition often includes antibiotic medicines. If you were prescribed an antibiotic medicine, take it as told by your health care provider. Do not stop using the antibiotic even if you start to feel better. Keep all follow-up visits. This is important. This information is not intended to replace advice given to you by your health care provider. Make sure you discuss any questions you have with your health care provider. Document Revised: 12/17/2019 Document Reviewed: 12/22/2019 Elsevier Patient Education  2024 [employer].

## 2023-02-08 ENCOUNTER — Other Ambulatory Visit: Payer: Self-pay | Admitting: Internal Medicine

## 2023-02-09 ENCOUNTER — Ambulatory Visit: Payer: No Typology Code available for payment source | Admitting: Internal Medicine

## 2023-02-09 ENCOUNTER — Encounter: Payer: Self-pay | Admitting: Internal Medicine

## 2023-02-09 VITALS — BP 122/82 | HR 68 | Temp 98.1°F | Ht 68.0 in | Wt 182.8 lb

## 2023-02-09 DIAGNOSIS — N200 Calculus of kidney: Secondary | ICD-10-CM

## 2023-02-09 DIAGNOSIS — Z87448 Personal history of other diseases of urinary system: Secondary | ICD-10-CM | POA: Diagnosis not present

## 2023-02-09 DIAGNOSIS — N390 Urinary tract infection, site not specified: Secondary | ICD-10-CM

## 2023-02-09 DIAGNOSIS — R739 Hyperglycemia, unspecified: Secondary | ICD-10-CM | POA: Diagnosis not present

## 2023-02-09 DIAGNOSIS — E876 Hypokalemia: Secondary | ICD-10-CM

## 2023-02-09 DIAGNOSIS — N1831 Chronic kidney disease, stage 3a: Secondary | ICD-10-CM

## 2023-02-09 DIAGNOSIS — I700 Atherosclerosis of aorta: Secondary | ICD-10-CM

## 2023-02-09 DIAGNOSIS — N3001 Acute cystitis with hematuria: Secondary | ICD-10-CM

## 2023-02-09 DIAGNOSIS — I480 Paroxysmal atrial fibrillation: Secondary | ICD-10-CM

## 2023-02-09 DIAGNOSIS — E871 Hypo-osmolality and hyponatremia: Secondary | ICD-10-CM | POA: Diagnosis not present

## 2023-02-09 DIAGNOSIS — I1310 Hypertensive heart and chronic kidney disease without heart failure, with stage 1 through stage 4 chronic kidney disease, or unspecified chronic kidney disease: Secondary | ICD-10-CM

## 2023-02-09 NOTE — Progress Notes (Signed)
I,[name], CMA,acting as a Neurosurgeon for [name], MD.,have documented all relevant documentation on the behalf of [name], MD,as directed by  [name], MD while in the presence of [name], MD.  Subjective:  Patient ID: Lawrence Bailey , male    DOB: 1940-09-16 , 82 y.o.   MRN: [id number]  Chief Complaint  Patient presents with   Hopsital Follow up    HPI  Patient presents today for hospital follow up. He was admitted to [facility] on 01/31/23 and was discharged on 02/02/23.  He presented to the emergency department with complaints of abdominal pain, fever, chills, and malaise. Patient was admitted and managed for UTI/pyelonephritis secondary to E. coli.  CT scan of the abdomen and pelvis revealed nonobstructing nephrolithiasis.  Patient agrees to f/u with Urology.  Liberal oral fluid.  Follow-up with PCP and urology team on discharge.  Patient was managed with IV Rocephin.  Patient was also discharged on oral ciprofloxacin.    He reports he is now feeling pretty good.  He states he has completed the full course of abx.   He wants a medication that will help " dissolve" his kidney stones.       Past Medical History:  Diagnosis Date   BPH (benign prostatic hyperplasia)    Coronary artery disease    GIST (gastrointestinal stromal tumor), malignant (HCC) dx'd 04/2007   gleevac comp 04/2008   Glaucoma    Hypertension    Prostate cancer (HCC)    Renal insufficiency      Family History  Problem Relation Age of Onset   Hypertension Mother    Cancer Mother    Breast cancer Neg Hx    Prostate cancer Neg Hx    Colon cancer Neg Hx    Pancreatic cancer Neg Hx      Current Outpatient Medications:    amLODipine (NORVASC) 10 MG tablet, Take 10 mg by mouth daily., Disp: , Rfl: 3   apixaban (ELIQUIS) 5 MG TABS tablet, Take 1 tablet (5 mg total) by mouth 2 (two) times daily., Disp: 60 tablet, Rfl: 11   atorvastatin (LIPITOR) 10 MG tablet, TAKE 1  TABLET BY MOUTH MONDAY - FRIDAY AT BEDTIME (Patient taking differently: Take 10 mg by mouth See admin instructions. Take 1 tablet by mouth Monday - Friday at bedtime), Disp: 90 tablet, Rfl: 1   brimonidine (ALPHAGAN) 0.2 % ophthalmic solution, 1 drop 2 (two) times daily., Disp: , Rfl:    docusate sodium (COLACE) 100 MG capsule, Take 100 mg by mouth daily as needed for mild constipation., Disp: , Rfl:    finasteride (PROSCAR) 5 MG tablet, Take 5 mg by mouth daily., Disp: , Rfl:    fluticasone (FLONASE) 50 MCG/ACT nasal spray, INHALE 2 SPRAYS INTO EACH NOSTRIL EVERY NIGHT (Patient taking differently: Place 2 sprays into both nostrils See admin instructions. INHALE 2 SPRAYS INTO EACH NOSTRIL EVERY NIGHT), Disp: 48 mL, Rfl: 1   latanoprost (XALATAN) 0.005 % ophthalmic solution, Place 1 drop into both eyes at bedtime., Disp: , Rfl:    polyethylene glycol (MIRALAX / GLYCOLAX) 17 g packet, Take 17 g by mouth daily., Disp: 14 each, Rfl: 0   sildenafil (REVATIO) 20 MG tablet, Take 1 tablet (20 mg total) by mouth daily., Disp: 30 tablet, Rfl: 0   tamsulosin (FLOMAX) 0.4 MG CAPS capsule, Take 0.4 mg by mouth daily., Disp: , Rfl:    traMADol (ULTRAM) 50 MG tablet, Take 1 tablet (  50 mg total) by mouth every 12 (twelve) hours as needed., Disp: 20 tablet, Rfl: 0   valsartan (DIOVAN) 40 MG tablet, Take 1 tablet (40 mg total) by mouth daily., Disp: 30 tablet, Rfl: 1   apixaban (ELIQUIS) 5 MG TABS tablet, Take 1 tablet (5 mg total) by mouth 2 (two) times daily., Disp: , Rfl:    Allergies  Allergen Reactions   Other     Bee sting    Sucralfate Other (See Comments)     Review of Systems  Constitutional: Negative.   HENT: Negative.    Respiratory: Negative.    Cardiovascular: Negative.   Gastrointestinal: Negative.   Skin: Negative.   Allergic/Immunologic: Negative.   Neurological: Negative.   Hematological: Negative.      Today's Vitals   02/09/23 1543  BP: 122/82  Pulse: 68  Temp: 98.1 F (36.7 C)   SpO2: 98%  Weight: 182 lb 12.8 oz (82.9 kg)  Height: 5\' 8"  (1.727 m)   Body mass index is 27.79 kg/m.  Wt Readings from Last 3 Encounters:  02/12/23 180 lb (81.6 kg)  02/09/23 182 lb 12.8 oz (82.9 kg)  01/31/23 173 lb 15.1 oz (78.9 kg)     Objective:  Physical Exam Vitals and nursing note reviewed.  Constitutional:      Appearance: Normal appearance.  HENT:     Head: Normocephalic and atraumatic.  Eyes:     Extraocular Movements: Extraocular movements intact.  Cardiovascular:     Rate and Rhythm: Normal rate and regular rhythm.     Heart sounds: Normal heart sounds.  Pulmonary:     Effort: Pulmonary effort is normal.     Breath sounds: Normal breath sounds.  Musculoskeletal:     Cervical back: Normal range of motion.  Skin:    General: Skin is warm.  Neurological:     General: No focal deficit present.     Mental Status: He is alert.  Psychiatric:        Mood and Affect: Mood normal.         Assessment And Plan:  Nephrolithiasis Assessment & Plan: TCM PERFORMED. A MEMBER OF THE CLINICAL TEAM SPOKE WITH THE PATIENT UPON DISCHARGE. DISCHARGE SUMMARY WAS REVIEWED IN FULL DETAIL DURING THE VISIT. MEDS RECONCILED AND COMPARED TO DISCHARGE MEDS. MEDICATION LIST WAS UPDATED AND REVIEWED WITH THE PATIENT. GREATER THAN 50% FACE TO FACE TIME WAS SPENT IN COUNSELING AND COORDINATION OF CARE. ALL QUESTIONS WERE ANSWERED TO THE SATISFACTION OF THE PATIENT. He is encouraged to keep upcoming f/u appt with Urology. He is advised that they will determine his treatment plan. He is encouraged to stay well hydrated.     Hypertensive heart and renal disease with renal failure, stage 1 through stage 4 or unspecified chronic kidney disease, without heart failure Assessment & Plan: Chronic, well controlled.  He will continue with amlodipine 10mg  and valsartan/hct 320/25mg  daily. He is encouraged to follow a low sodium diet. He will f/u in four to six months for re-evaluation.     Atherosclerosis of aorta (HCC) Assessment & Plan: Chronic, LDL goal < 70.  He will continue with atorvastatin 10mg  M-F. He is encouraged to follow heart healthy lifestyle.    Stage 3a chronic kidney disease (HCC) Assessment & Plan: Chronic, he is encouraged to stay well hydrated, avoid NSAIDs and keep BP controlled to prevent progression of CKD.    Orders: -     BMP8+EGFR  Hyperglycemia Assessment & Plan: Previous labs reviewed, his glucose was  elevated during his hospitalization. I will check an A1c today. Reminded to avoid refined sugars including sugary drinks/foods and processed meats including bacon, sausages and deli meats.    Orders: -     Hemoglobin A1c  Hyponatremia Assessment & Plan: Seen during his hospitalization. I will check BMP today.   Orders: -     BMP8+EGFR  Hypokalemia Assessment & Plan: Seen on labs during his hospitalization. I will recheck a potassium level today.   Orders: -     BMP8+EGFR  History of pyelonephritis     Return if symptoms worsen or fail to improve.  Patient was given opportunity to ask questions. Patient verbalized understanding of the plan and was able to repeat key elements of the plan. All questions were answered to their satisfaction.    I, [name], MD, have reviewed all documentation for this visit. The documentation on 02/09/23 for the exam, diagnosis, procedures, and orders are all accurate and complete.   IF YOU HAVE BEEN REFERRED TO A SPECIALIST, IT MAY TAKE 1-2 WEEKS TO SCHEDULE/PROCESS THE REFERRAL. IF YOU HAVE NOT HEARD FROM US/SPECIALIST IN TWO WEEKS, PLEASE GIVE [place] A CALL AT [phone].   THE PATIENT IS ENCOURAGED TO PRACTICE SOCIAL DISTANCING DUE TO THE COVID-19 PANDEMIC.

## 2023-02-11 DIAGNOSIS — R69 Illness, unspecified: Secondary | ICD-10-CM | POA: Diagnosis not present

## 2023-02-12 ENCOUNTER — Other Ambulatory Visit (INDEPENDENT_AMBULATORY_CARE_PROVIDER_SITE_OTHER): Payer: No Typology Code available for payment source

## 2023-02-12 ENCOUNTER — Ambulatory Visit (INDEPENDENT_AMBULATORY_CARE_PROVIDER_SITE_OTHER): Payer: No Typology Code available for payment source | Admitting: Gastroenterology

## 2023-02-12 ENCOUNTER — Encounter: Payer: Self-pay | Admitting: Gastroenterology

## 2023-02-12 VITALS — BP 130/70 | HR 6 | Ht 68.0 in | Wt 180.0 lb

## 2023-02-12 DIAGNOSIS — D509 Iron deficiency anemia, unspecified: Secondary | ICD-10-CM | POA: Diagnosis not present

## 2023-02-12 LAB — CBC WITH DIFFERENTIAL/PLATELET
Basophils Absolute: 0 10*3/uL (ref 0.0–0.1)
Basophils Relative: 0.5 % (ref 0.0–3.0)
Eosinophils Absolute: 0.1 10*3/uL (ref 0.0–0.7)
Eosinophils Relative: 0.9 % (ref 0.0–5.0)
HCT: 32.9 % — ABNORMAL LOW (ref 39.0–52.0)
Hemoglobin: 10.4 g/dL — ABNORMAL LOW (ref 13.0–17.0)
Lymphocytes Relative: 23.6 % (ref 12.0–46.0)
Lymphs Abs: 1.5 10*3/uL (ref 0.7–4.0)
MCHC: 31.6 g/dL (ref 30.0–36.0)
MCV: 84.9 fl (ref 78.0–100.0)
Monocytes Absolute: 0.5 10*3/uL (ref 0.1–1.0)
Monocytes Relative: 7.5 % (ref 3.0–12.0)
Neutro Abs: 4.1 10*3/uL (ref 1.4–7.7)
Neutrophils Relative %: 67.5 % (ref 43.0–77.0)
Platelets: 345 10*3/uL (ref 150.0–400.0)
RBC: 3.87 Mil/uL — ABNORMAL LOW (ref 4.22–5.81)
RDW: 15 % (ref 11.5–15.5)
WBC: 6.1 10*3/uL (ref 4.0–10.5)

## 2023-02-12 LAB — COMPREHENSIVE METABOLIC PANEL
ALT: 18 U/L (ref 0–53)
AST: 17 U/L (ref 0–37)
Albumin: 3.6 g/dL (ref 3.5–5.2)
Alkaline Phosphatase: 59 U/L (ref 39–117)
BUN: 21 mg/dL (ref 6–23)
CO2: 29 mEq/L (ref 19–32)
Calcium: 9.7 mg/dL (ref 8.4–10.5)
Chloride: 100 mEq/L (ref 96–112)
Creatinine, Ser: 1.33 mg/dL (ref 0.40–1.50)
GFR: 49.65 mL/min — ABNORMAL LOW (ref >60.00)
Glucose, Bld: 86 mg/dL (ref 70–99)
Potassium: 4.5 mEq/L (ref 3.5–5.1)
Sodium: 134 mEq/L — ABNORMAL LOW (ref 135–145)
Total Bilirubin: 0.4 mg/dL (ref 0.2–1.2)
Total Protein: 9.2 g/dL — ABNORMAL HIGH (ref 6.0–8.3)

## 2023-02-12 LAB — IBC + FERRITIN
Ferritin: 124.8 ng/mL (ref 22.0–322.0)
Iron: 77 ug/dL (ref 42–165)
Saturation Ratios: 26.1 % (ref 20.0–50.0)
TIBC: 295.4 ug/dL (ref 250.0–450.0)
Transferrin: 211 mg/dL — ABNORMAL LOW (ref 212.0–360.0)

## 2023-02-12 NOTE — Patient Instructions (Signed)
Your provider has requested that you go to the basement level for lab work before leaving today. Press "B" on the elevator. The lab is located at the first door on the left as you exit the elevator.  Due to recent changes in healthcare laws, you may see the results of your imaging and laboratory studies on MyChart before your provider has had a chance to review them.  We understand that in some cases there may be results that are confusing or concerning to you. Not all laboratory results come back in the same time frame and the provider may be waiting for multiple results in order to interpret others.  Please give [place] 48 hours in order for your provider to thoroughly review all the results before contacting the office for clarification of your results.   It was a pleasure to see you today!  Thank you for trusting me with your gastrointestinal care!

## 2023-02-12 NOTE — Progress Notes (Signed)
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  Patient recently hospitalized for pyelonephritis.  During hospitalization they noted his hemoglobin dropped to 9.2.  He has had chronic iron deficiency anemia "since he can remember" and appears his baseline is around 10-11.  Patient denies overt bleeding.  Denies melena/hematochezia.  Patient states his whole life he has been a slow eater.  States he will eat 1 meal and the time a regular person can eat 3 meals.  He is typically able to finish the majority of his meals without difficulty.  He states he had a colonoscopy 5 to 6 years ago with Dr. Elnoria Howard.  States he may have had polyps.  We will obtain these records.  Patient sometimes has some constipation with LLQ pain associated.  He is very active and mows his lawn and walks daily.  Denies nausea/vomiting.  Denies upper GI symptoms.  Recent lab workup 02/02/2023 BUN 31, creatinine 1.52, GFR 45 Hgb 10.0, HCT 31.4, MCV 86, RDW 14 Negative Hemoccult  Patient had lab studies early August which showed iron 22, ferritin 303, saturation 11%, patient is not on oral iron therapy and has never been put on it.    CT abdomen pelvis done in hospital showed no concern for bowel wall thickening or bowel abnormality.  She had pyelonephritis.  Past Medical History:  Diagnosis Date   BPH (benign prostatic hyperplasia)    Coronary artery disease    GIST (gastrointestinal stromal tumor), malignant (HCC) dx'd 04/2007   gleevac comp 04/2008   Glaucoma    Hypertension    Prostate cancer (HCC)    Renal insufficiency     Past Surgical History:  Procedure Laterality Date   ABDOMINAL SURGERY     EYE SURGERY     HERNIA REPAIR     KNEE SURGERY      Current Outpatient Medications  Medication Sig Dispense Refill   amLODipine (NORVASC) 10 MG tablet Take 10  mg by mouth daily.  3   apixaban (ELIQUIS) 5 MG TABS tablet Take 1 tablet (5 mg total) by mouth 2 (two) times daily. 60 tablet 11   atorvastatin (LIPITOR) 10 MG tablet TAKE 1 TABLET BY MOUTH MONDAY - FRIDAY AT BEDTIME (Patient taking differently: Take 10 mg by mouth See admin instructions. Take 1 tablet by mouth Monday - Friday at bedtime) 90 tablet 1   brimonidine (ALPHAGAN) 0.2 % ophthalmic solution 1 drop 2 (two) times daily.     docusate sodium (COLACE) 100 MG capsule Take 100 mg by mouth daily as needed for mild constipation.     finasteride (PROSCAR) 5 MG tablet Take 5 mg by mouth daily.     fluticasone (FLONASE) 50 MCG/ACT nasal spray INHALE 2 SPRAYS INTO EACH NOSTRIL EVERY NIGHT (Patient taking differently: Place 2 sprays into both nostrils See admin instructions. INHALE 2 SPRAYS INTO EACH NOSTRIL EVERY NIGHT) 48 mL 1   latanoprost (XALATAN) 0.005 % ophthalmic solution Place 1 drop into both eyes at bedtime.     polyethylene glycol (MIRALAX / GLYCOLAX) 17 g packet Take 17 g by mouth daily. 14 each 0   sildenafil (REVATIO) 20 MG tablet Take 1 tablet (20 mg total) by mouth daily. 30 tablet 0   tamsulosin (FLOMAX) 0.4 MG CAPS capsule Take 0.4 mg by mouth daily.     traMADol (ULTRAM) 50 MG tablet Take 1 tablet (50  mg total) by mouth every 12 (twelve) hours as needed. 20 tablet 0   valsartan (DIOVAN) 40 MG tablet Take 1 tablet (40 mg total) by mouth daily. 30 tablet 1   No current facility-administered medications for this visit.    Allergies as of 02/12/2023 - Review Complete 02/09/2023  Allergen Reaction Noted   Other  03/22/2012   Sucralfate Other (See Comments) 04/14/2017    Family History  Problem Relation Age of Onset   Hypertension Mother    Cancer Mother    Breast cancer Neg Hx    Prostate cancer Neg Hx    Colon cancer Neg Hx    Pancreatic cancer Neg Hx     Social History   Socioeconomic History   Marital status: Married    Spouse name: Not on file   Number of  children: Not on file   Years of education: Not on file   Highest education level: 11th grade  Occupational History   Occupation: retired  Tobacco Use   Smoking status: Never   Smokeless tobacco: Never  Vaping Use   Vaping status: Never Used  Substance and Sexual Activity   Alcohol use: Not Currently   Drug use: No   Sexual activity: Yes  Other Topics Concern   Not on file  Social History Narrative   Not on file   Social Determinants of Health   Financial Resource Strain: Low Risk  (12/28/2022)   Overall Financial Resource Strain (CARDIA)    Difficulty of Paying Living Expenses: Not hard at all  Food Insecurity: No Food Insecurity (02/04/2023)   Hunger Vital Sign    Worried About Running Out of Food in the Last Year: Never true    Ran Out of Food in the Last Year: Never true  Transportation Needs: No Transportation Needs (02/04/2023)   PRAPARE - Administrator, Civil Service (Medical): No    Lack of Transportation (Non-Medical): No  Physical Activity: Inactive (12/28/2022)   Exercise Vital Sign    Days of Exercise per Week: 0 days    Minutes of Exercise per Session: 10 min  Stress: No Stress Concern Present (12/28/2022)   [employer] of Occupational Health - Occupational Stress Questionnaire    Feeling of Stress : Not at all  Social Connections: Unknown (12/28/2022)   Social Connection and Isolation Panel [NHANES]    Frequency of Communication with Friends and Family: More than three times a week    Frequency of Social Gatherings with Friends and Family: Patient declined    Attends Religious Services: Patient declined    Database administrator or Organizations: No    Attends Banker Meetings: Never    Marital Status: Married  Recent Concern: Social Connections - Moderately Isolated (12/10/2022)   Social Connection and Isolation Panel [NHANES]    Frequency of Communication with Friends and Family: Once a week    Frequency of Social Gatherings with  Friends and Family: Three times a week    Attends Religious Services: Never    Active Member of Clubs or Organizations: No    Attends Banker Meetings: Never    Marital Status: Married  Catering manager Violence: Not At Risk (01/31/2023)   Humiliation, Afraid, Rape, and Kick questionnaire    Fear of Current or Ex-Partner: No    Emotionally Abused: No    Physically Abused: No    Sexually Abused: No    Review of Systems:    Constitutional: No weight loss,  fever, chills, weakness or fatigue HEENT: Eyes: No change in vision               Ears, Nose, Throat:  No change in hearing or congestion Skin: No rash or itching Cardiovascular: No chest pain, chest pressure or palpitations   Respiratory: No SOB or cough Gastrointestinal: See HPI and otherwise negative Genitourinary: No dysuria or change in urinary frequency Neurological: No headache, dizziness or syncope Musculoskeletal: No new muscle or joint pain Hematologic: No bleeding or bruising Psychiatric: No history of depression or anxiety    Physical Exam:  Vital signs: There were no vitals taken for this visit.  Constitutional: NAD, Well developed, Well nourished, alert and cooperative.  Appears younger than stated age Head:  Normocephalic and atraumatic. Eyes:   PEERL, EOMI. No icterus. Conjunctiva pink. Respiratory: Respirations even and unlabored. Lungs clear to auscultation bilaterally.   No wheezes, crackles, or rhonchi.  Cardiovascular:  Regular rate and rhythm. No peripheral edema, cyanosis or pallor.  Gastrointestinal:  Soft, nondistended, nontender. No rebound or guarding. Normal bowel sounds. No appreciable masses or hepatomegaly. Rectal:  Not performed.  Msk:  Symmetrical without gross deformities. Without edema, no deformity or joint abnormality.  Neurologic:  Alert and  oriented x4;  grossly normal neurologically.  Skin:   Dry and intact without significant lesions or rashes. Psychiatric: Oriented to  person, place and time. Demonstrates good judgement and reason without abnormal affect or behaviors.   RELEVANT LABS AND IMAGING: CBC    Component Value Date/Time   WBC 7.6 02/02/2023 0536   RBC 3.65 (L) 02/02/2023 0536   HGB 10.0 (L) 02/02/2023 0536   HGB 10.7 (L) 12/10/2022 1509   HGB 13.0 04/10/2014 0946   HCT 31.4 (L) 02/02/2023 0536   HCT 33.0 (L) 12/10/2022 1509   HCT 41.2 04/10/2014 0946   PLT 156 02/02/2023 0536   PLT 167 12/10/2022 1509   MCV 86.0 02/02/2023 0536   MCV 83 12/10/2022 1509   MCV 80.9 04/10/2014 0946   MCH 27.4 02/02/2023 0536   MCHC 31.8 02/02/2023 0536   RDW 14.0 02/02/2023 0536   RDW 13.7 12/10/2022 1509   RDW 14.7 (H) 04/10/2014 0946   LYMPHSABS 0.9 02/02/2023 0536   LYMPHSABS 1.6 07/09/2020 1530   LYMPHSABS 1.3 04/10/2014 0946   MONOABS 0.8 02/02/2023 0536   MONOABS 0.4 04/10/2014 0946   EOSABS 0.0 02/02/2023 0536   EOSABS 0.0 07/09/2020 1530   BASOSABS 0.0 02/02/2023 0536   BASOSABS 0.0 07/09/2020 1530   BASOSABS 0.1 04/10/2014 0946    CMP     Component Value Date/Time   NA 136 02/09/2023 1633   NA 141 04/10/2014 0946   K 4.6 02/09/2023 1633   K 4.0 04/10/2014 0946   CL 101 02/09/2023 1633   CL 105 03/28/2012 0804   CO2 24 02/09/2023 1633   CO2 28 04/10/2014 0946   GLUCOSE 126 (H) 02/09/2023 1633   GLUCOSE 122 (H) 02/02/2023 0536   GLUCOSE 106 04/10/2014 0946   GLUCOSE 100 (H) 03/28/2012 0804   BUN 20 02/09/2023 1633   BUN 20.6 04/10/2014 0946   CREATININE 1.25 02/09/2023 1633   CREATININE 1.20 01/30/2022 1306   CREATININE 1.5 (H) 04/10/2014 0946   CALCIUM 9.2 02/09/2023 1633   CALCIUM 9.8 04/10/2014 0946   PROT 6.9 02/01/2023 0527   PROT 8.0 12/10/2022 1509   PROT 7.6 04/10/2014 0946   ALBUMIN 2.3 (L) 02/02/2023 0536   ALBUMIN 3.9 12/10/2022 1509   ALBUMIN 4.0 04/10/2014  0946   AST 20 02/01/2023 0527   AST 19 01/30/2022 1306   AST 23 04/10/2014 0946   ALT 23 02/01/2023 0527   ALT 14 01/30/2022 1306   ALT 19 04/10/2014  0946   ALKPHOS 55 02/01/2023 0527   ALKPHOS 49 04/10/2014 0946   BILITOT 0.6 02/01/2023 0527   BILITOT 0.4 12/10/2022 1509   BILITOT 0.8 01/30/2022 1306   BILITOT 0.62 04/10/2014 0946   GFRNONAA 45 (L) 02/02/2023 0536   GFRNONAA >60 01/30/2022 1306   GFRAA 62 07/09/2020 1530   GFRAA >60 02/01/2020 0924     Assessment/Plan:   Iron deficiency anemia Patient has chronic IDA most of his life.  No overt bleeding and negative Hemoccult in early August.  Suspect acute drop in hemoglobin to 9.2 during hospitalization may have been hemodilution with IVF.  Appears to be in adequate health and I believe he would be a good candidate for EGD/colonoscopy if necessary.  When I brought this up patient's wife became tearful as her mother passed away after having EGD/colonoscopy so it makes her very nervous for her husband to undergo the same.  Extensive discussion with patient and husband about next steps and joint decision making led to conservative management with rechecking labs today, putting patient on oral iron supplement, and close follow-up.  We will also obtain records from Dr. Elnoria Howard  -CBC, CMP, IBC and ferritin - Pending labs may put him on an oral supplement once daily - Recommend MiraLAX 1 capful daily for history of constipation and adjust based on dose.  Advised of side effects of iron tablet constipation and dark stools - Obtain records from Dr. Haywood Pao office - Close follow-up 2 to 3 months  Tymesha Ditmore Jolee Ewing [facility] 02/12/2023, 1:23 PM  Cc: Dorothyann Peng, MD

## 2023-02-15 ENCOUNTER — Other Ambulatory Visit: Payer: Self-pay | Admitting: *Deleted

## 2023-02-15 DIAGNOSIS — D649 Anemia, unspecified: Secondary | ICD-10-CM

## 2023-02-16 ENCOUNTER — Encounter: Payer: Self-pay | Admitting: Internal Medicine

## 2023-02-16 ENCOUNTER — Other Ambulatory Visit: Payer: Self-pay

## 2023-02-16 ENCOUNTER — Telehealth: Payer: Self-pay

## 2023-02-16 DIAGNOSIS — N5319 Other ejaculatory dysfunction: Secondary | ICD-10-CM | POA: Diagnosis not present

## 2023-02-16 DIAGNOSIS — N390 Urinary tract infection, site not specified: Secondary | ICD-10-CM | POA: Diagnosis not present

## 2023-02-16 MED ORDER — APIXABAN 5 MG PO TABS: 5.0000 mg | ORAL_TABLET | Freq: Two times a day (BID) | ORAL | Status: DC

## 2023-02-16 NOTE — Progress Notes (Signed)
Agree with the assessment and plan as outlined by Cira Servant, PA-C.  Estelene Carmack, DO, [facility]

## 2023-02-18 DIAGNOSIS — R69 Illness, unspecified: Secondary | ICD-10-CM | POA: Diagnosis not present

## 2023-02-21 DIAGNOSIS — G4733 Obstructive sleep apnea (adult) (pediatric): Secondary | ICD-10-CM | POA: Diagnosis not present

## 2023-02-22 DIAGNOSIS — N1831 Chronic kidney disease, stage 3a: Secondary | ICD-10-CM | POA: Diagnosis not present

## 2023-02-22 DIAGNOSIS — K219 Gastro-esophageal reflux disease without esophagitis: Secondary | ICD-10-CM | POA: Diagnosis not present

## 2023-02-22 DIAGNOSIS — I129 Hypertensive chronic kidney disease with stage 1 through stage 4 chronic kidney disease, or unspecified chronic kidney disease: Secondary | ICD-10-CM | POA: Diagnosis not present

## 2023-02-22 DIAGNOSIS — R300 Dysuria: Secondary | ICD-10-CM | POA: Diagnosis not present

## 2023-02-22 DIAGNOSIS — K5900 Constipation, unspecified: Secondary | ICD-10-CM | POA: Diagnosis not present

## 2023-02-22 DIAGNOSIS — D649 Anemia, unspecified: Secondary | ICD-10-CM | POA: Diagnosis not present

## 2023-02-22 DIAGNOSIS — D472 Monoclonal gammopathy: Secondary | ICD-10-CM | POA: Diagnosis not present

## 2023-02-22 DIAGNOSIS — N200 Calculus of kidney: Secondary | ICD-10-CM | POA: Insufficient documentation

## 2023-02-22 NOTE — Assessment & Plan Note (Signed)
TCM PERFORMED. A MEMBER OF THE CLINICAL TEAM SPOKE WITH THE PATIENT UPON DISCHARGE. DISCHARGE SUMMARY WAS REVIEWED IN FULL DETAIL DURING THE VISIT. MEDS RECONCILED AND COMPARED TO DISCHARGE MEDS. MEDICATION LIST WAS UPDATED AND REVIEWED WITH THE PATIENT. GREATER THAN 50% FACE TO FACE TIME WAS SPENT IN COUNSELING AND COORDINATION OF CARE. ALL QUESTIONS WERE ANSWERED TO THE SATISFACTION OF THE PATIENT. He is encouraged to keep upcoming f/u appt with Urology. He is advised that they will determine his treatment plan. He is encouraged to stay well hydrated.

## 2023-02-22 NOTE — Assessment & Plan Note (Signed)
Chronic, well controlled.  He will continue with amlodipine 10mg  and valsartan/hct 320/25mg  daily. He is encouraged to follow a low sodium diet. He will f/u in four to six months for re-evaluation.

## 2023-02-22 NOTE — Assessment & Plan Note (Signed)
Previous labs reviewed, his glucose was elevated during his hospitalization. I will check an A1c today. Reminded to avoid refined sugars including sugary drinks/foods and processed meats including bacon, sausages and deli meats.

## 2023-02-22 NOTE — Assessment & Plan Note (Signed)
 Chronic, he is encouraged to stay well hydrated, avoid NSAIDs and keep BP controlled to prevent progression of CKD.

## 2023-02-22 NOTE — Assessment & Plan Note (Signed)
Chronic, LDL goal < 70.  He will continue with atorvastatin 10mg  M-F. He is encouraged to follow heart healthy lifestyle.

## 2023-02-22 NOTE — Assessment & Plan Note (Signed)
Seen on labs during his hospitalization. I will recheck a potassium level today.

## 2023-02-22 NOTE — Assessment & Plan Note (Signed)
Seen during his hospitalization. I will check BMP today.

## 2023-02-24 ENCOUNTER — Inpatient Hospital Stay: Payer: No Typology Code available for payment source

## 2023-02-24 ENCOUNTER — Inpatient Hospital Stay: Payer: No Typology Code available for payment source | Admitting: Oncology

## 2023-02-24 ENCOUNTER — Inpatient Hospital Stay: Payer: No Typology Code available for payment source | Attending: Oncology

## 2023-02-24 VITALS — BP 126/64 | HR 78 | Temp 97.9°F | Resp 18 | Ht 68.0 in | Wt 177.7 lb

## 2023-02-24 DIAGNOSIS — Z8509 Personal history of malignant neoplasm of other digestive organs: Secondary | ICD-10-CM | POA: Diagnosis not present

## 2023-02-24 DIAGNOSIS — Z23 Encounter for immunization: Secondary | ICD-10-CM | POA: Insufficient documentation

## 2023-02-24 DIAGNOSIS — D472 Monoclonal gammopathy: Secondary | ICD-10-CM | POA: Diagnosis not present

## 2023-02-24 DIAGNOSIS — D649 Anemia, unspecified: Secondary | ICD-10-CM | POA: Insufficient documentation

## 2023-02-24 LAB — CMP (CANCER CENTER ONLY)
ALT: 11 U/L (ref 0–44)
AST: 15 U/L (ref 15–41)
Albumin: 3.5 g/dL (ref 3.5–5.0)
Alkaline Phosphatase: 44 U/L (ref 38–126)
Anion gap: 3 — ABNORMAL LOW (ref 5–15)
BUN: 24 mg/dL — ABNORMAL HIGH (ref 8–23)
CO2: 29 mmol/L (ref 22–32)
Calcium: 9.5 mg/dL (ref 8.9–10.3)
Chloride: 103 mmol/L (ref 98–111)
Creatinine: 1.26 mg/dL — ABNORMAL HIGH (ref 0.61–1.24)
GFR, Estimated: 57 mL/min — ABNORMAL LOW (ref >60)
Glucose, Bld: 101 mg/dL — ABNORMAL HIGH (ref 70–99)
Potassium: 4.2 mmol/L (ref 3.5–5.1)
Sodium: 135 mmol/L (ref 135–145)
Total Bilirubin: 0.6 mg/dL (ref 0.3–1.2)
Total Protein: 8.8 g/dL — ABNORMAL HIGH (ref 6.5–8.1)

## 2023-02-24 LAB — CBC WITH DIFFERENTIAL (CANCER CENTER ONLY)
Abs Immature Granulocytes: 0.01 10*3/uL (ref 0.00–0.07)
Basophils Absolute: 0 10*3/uL (ref 0.0–0.1)
Basophils Relative: 0 %
Eosinophils Absolute: 0 10*3/uL (ref 0.0–0.5)
Eosinophils Relative: 1 %
HCT: 32.7 % — ABNORMAL LOW (ref 39.0–52.0)
Hemoglobin: 10.2 g/dL — ABNORMAL LOW (ref 13.0–17.0)
Immature Granulocytes: 0 %
Lymphocytes Relative: 31 %
Lymphs Abs: 1.2 10*3/uL (ref 0.7–4.0)
MCH: 27.3 pg (ref 26.0–34.0)
MCHC: 31.2 g/dL (ref 30.0–36.0)
MCV: 87.7 fL (ref 80.0–100.0)
Monocytes Absolute: 0.3 10*3/uL (ref 0.1–1.0)
Monocytes Relative: 8 %
Neutro Abs: 2.4 10*3/uL (ref 1.7–7.7)
Neutrophils Relative %: 60 %
Platelet Count: 145 10*3/uL — ABNORMAL LOW (ref 150–400)
RBC: 3.73 MIL/uL — ABNORMAL LOW (ref 4.22–5.81)
RDW: 15 % (ref 11.5–15.5)
WBC Count: 4 10*3/uL (ref 4.0–10.5)
nRBC: 0 % (ref 0.0–0.2)

## 2023-02-24 MED ORDER — INFLUENZA VAC A&B SURF ANT ADJ 0.5 ML IM SUSY
0.5000 mL | PREFILLED_SYRINGE | Freq: Once | INTRAMUSCULAR | Status: AC
  Administered 2023-02-24: 0.5 mL via INTRAMUSCULAR
  Filled 2023-02-24: qty 0.5

## 2023-02-24 NOTE — Progress Notes (Signed)
Call to lab to add on IgG and SPEP. Not able to add on due to not having correct tube.

## 2023-02-24 NOTE — Progress Notes (Signed)
[facility] OFFICE PROGRESS NOTE   Diagnosis: Gastrointestinal stromal tumor, serum monoclonal protein  INTERVAL HISTORY:   Lawrence Bailey returns as scheduled.  He feels well.  He was admitted on 01/31/2023 with a urinary tract infection.  CT Abdo/pelvis was concerning for left-sided pyelonephritis.  The stomach appeared normal.  No enlarged lymph nodes. He was discharged to home on 02/02/2023.  He denies bleeding.   Objective:  Vital signs in last 24 hours:  Blood pressure 126/64, pulse 78, temperature 97.9 F (36.6 C), temperature source Temporal, resp. rate 18, height 5\' 8"  (1.727 m), weight 177 lb 11.2 oz (80.6 kg), SpO2 100%.   Lymphatics: No cervical, supraclavicular, axillary, or inguinal nodes Resp: End inspiratory rhonchi at the left posterior base, no respiratory distress Cardio: Regular rhythm with premature beats GI: No hepatosplenomegaly, no mass, nontender Vascular: No leg edema   Lab Results:  Lab Results  Component Value Date   WBC 4.0 02/24/2023   HGB 10.2 (L) 02/24/2023   HCT 32.7 (L) 02/24/2023   MCV 87.7 02/24/2023   PLT 145 (L) 02/24/2023   NEUTROABS 2.4 02/24/2023    CMP  Lab Results  Component Value Date   NA 135 02/24/2023   K 4.2 02/24/2023   CL 103 02/24/2023   CO2 29 02/24/2023   GLUCOSE 101 (H) 02/24/2023   BUN 24 (H) 02/24/2023   CREATININE 1.26 (H) 02/24/2023   CALCIUM 9.5 02/24/2023   PROT 8.8 (H) 02/24/2023   ALBUMIN 3.5 02/24/2023   AST 15 02/24/2023   ALT 11 02/24/2023   ALKPHOS 44 02/24/2023   BILITOT 0.6 02/24/2023   GFRNONAA 57 (L) 02/24/2023   GFRAA 62 07/09/2020    No results found for: "CEA1", "CEA", "RUE454", "CA125"  Medications: I have reviewed the patient's current medications.   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 Pathology showed a 6 cm GI stromal tumor. No vascular or lymphatic invasion. All surgical margins negative.  Lymph nodes not sampled. Tumor confined to the submucosa. Mitotic activity 25-50 mitoses per high-power field. He completed 1 year of Gleevec 400 mg daily. Restaging CT abdomen/pelvis 04/10/2014 with no evidence of recurrent/metastatic disease.  CT abdomen/pelvis 05/12/2018 without evidence of local recurrence or metastatic disease. History of right lower extremity DVT June 2018. Evaluation in the emergency department 03/08/2018 for right leg redness, swelling and tenderness.  Venous Doppler negative for DVT.  Incidental finding of a possible 3.3 cm right inguinal lymph node.  CT abdomen/pelvis 05/12/2018 showed stable small inguinal lymph nodes bilaterally. Chronic renal failure Monoclonal gammopathy of unknown significance, IgG lambda 01/09/2019 SPEP with M spike 1.5  07/05/2019 SPEP with M spike 1.9 07/30/2020 SPEP with M spike 2.2 01/30/2022 SPEP with M spike 2.4 07/07/2022 SPEP with M spike 2.4 Hypertension Prostate cancer-on surveillance, followed by urology    Disposition: Lawrence Bailey remains in clinical remission from the gastrointestinal stromal tumor.  He has a monoclonal gammopathy of unknown significance.  There is no clinical evidence for progression to multiple myeloma.  We will follow-up on the serum M spike today.  Lawrence Bailey has chronic anemia.  The hemoglobin is lower compared to when he was here last year.  Lawrence Bailey is likely in part related to the recent hospital admission with a urinary tract infection, insufficiency, potentially bleeding while on anticoagulation therapy.  Lawrence Bailey will return for an office and lab visit in 4 months.  He received an influenza vaccine today.  Lawrence Bailey  Truett Perna, MD  02/24/2023  11:04 AM

## 2023-03-01 ENCOUNTER — Other Ambulatory Visit: Payer: Self-pay

## 2023-03-01 ENCOUNTER — Telehealth: Payer: Self-pay | Admitting: Cardiology

## 2023-03-01 MED ORDER — APIXABAN 5 MG PO TABS: 5.0000 mg | ORAL_TABLET | Freq: Two times a day (BID) | ORAL | 0 refills | Status: DC

## 2023-03-01 NOTE — Telephone Encounter (Signed)
Returned pts call. Pt stated he had paperwork and was bringing it in to drop off today. Wanted to know if he could have samples as he will run out of eliquis tomorrow. Samples gathered and put up front for pt when he drops off paperwork for PA.

## 2023-03-01 NOTE — Telephone Encounter (Signed)
Patient dropped off forms for patient assistants. I left on your desk around 2;49pm

## 2023-03-01 NOTE — Telephone Encounter (Signed)
Pt c/o medication issue:  1. Name of Medication:   apixaban (ELIQUIS) 5 MG TABS tablet   2. How are you currently taking this medication (dosage and times per day)?   As prescribed  3. Are you having a reaction (difficulty breathing--STAT)?   No  4. What is your medication issue?   Wife Cala Bradford) stated patient is in the donut hole and cannot afford this medication.  Wife stated they are completing the [facility] [employer] application and will be dropping the paperwork off today.  Wife wants to know if the patient can get samples or next steps.

## 2023-03-02 NOTE — Telephone Encounter (Signed)
Pt's application for assistance for Eliquis was scanned to [employer]. FYI

## 2023-03-03 ENCOUNTER — Telehealth: Payer: Self-pay

## 2023-03-03 NOTE — Telephone Encounter (Signed)
Received email with income documentation for Eliquis PAP application, compiling together now

## 2023-03-03 NOTE — Telephone Encounter (Signed)
Thanks Lynden Ang, working on this

## 2023-03-04 ENCOUNTER — Other Ambulatory Visit (HOSPITAL_COMMUNITY): Payer: Self-pay

## 2023-03-04 NOTE — Telephone Encounter (Signed)
Missing 3% for both household members, patient aware. Wife will email to me once she has it.

## 2023-03-10 ENCOUNTER — Telehealth: Payer: Self-pay

## 2023-03-10 ENCOUNTER — Other Ambulatory Visit: Payer: Self-pay | Admitting: Internal Medicine

## 2023-03-10 DIAGNOSIS — I700 Atherosclerosis of aorta: Secondary | ICD-10-CM

## 2023-03-10 DIAGNOSIS — I1310 Hypertensive heart and chronic kidney disease without heart failure, with stage 1 through stage 4 chronic kidney disease, or unspecified chronic kidney disease: Secondary | ICD-10-CM

## 2023-03-10 NOTE — Telephone Encounter (Signed)
Called and spoke with him and his wife, he expresses interest in PREP as well and would like to attend next class at [name] on 10/28, every M/W 2:30-3:45; referral request sent to Dr. Allyne Gee; assessment visit scheduled for them both on 10/23 at 2pm

## 2023-03-15 ENCOUNTER — Encounter: Payer: Self-pay | Admitting: Cardiology

## 2023-03-15 ENCOUNTER — Telehealth: Payer: Self-pay

## 2023-03-15 MED ORDER — APIXABAN 5 MG PO TABS: 5.0000 mg | ORAL_TABLET | Freq: Two times a day (BID) | ORAL | Status: DC

## 2023-03-15 MED ORDER — APIXABAN 5 MG PO TABS: 5.0000 mg | ORAL_TABLET | Freq: Two times a day (BID) | ORAL | 0 refills | Status: DC

## 2023-03-15 NOTE — Addendum Note (Signed)
Addended by: Luellen Pucker on: 03/15/2023 04:08 PM   Modules accepted: Orders

## 2023-03-15 NOTE — Telephone Encounter (Signed)
Wife is calling back for update. Please advise  

## 2023-03-15 NOTE — Telephone Encounter (Signed)
2 weeks of samples waiting at front desk for patient as his eliquis patient assistance application is still pending.

## 2023-03-15 NOTE — Telephone Encounter (Signed)
Yes it's still pending if we can provide samples in the meantime please!

## 2023-03-15 NOTE — Telephone Encounter (Signed)
Spoke with wife. 3% printout has not been received for both household members, she is aware and is working on Network engineer to my email.

## 2023-03-18 DIAGNOSIS — E663 Overweight: Secondary | ICD-10-CM | POA: Diagnosis not present

## 2023-03-18 DIAGNOSIS — Z6827 Body mass index (BMI) 27.0-27.9, adult: Secondary | ICD-10-CM | POA: Diagnosis not present

## 2023-03-18 DIAGNOSIS — Z008 Encounter for other general examination: Secondary | ICD-10-CM | POA: Diagnosis not present

## 2023-03-18 DIAGNOSIS — I69351 Hemiplegia and hemiparesis following cerebral infarction affecting right dominant side: Secondary | ICD-10-CM | POA: Diagnosis not present

## 2023-03-18 DIAGNOSIS — Z85028 Personal history of other malignant neoplasm of stomach: Secondary | ICD-10-CM | POA: Diagnosis not present

## 2023-03-18 DIAGNOSIS — Z8546 Personal history of malignant neoplasm of prostate: Secondary | ICD-10-CM | POA: Diagnosis not present

## 2023-03-19 ENCOUNTER — Ambulatory Visit (INDEPENDENT_AMBULATORY_CARE_PROVIDER_SITE_OTHER): Payer: No Typology Code available for payment source

## 2023-03-19 ENCOUNTER — Other Ambulatory Visit (HOSPITAL_COMMUNITY): Payer: Self-pay

## 2023-03-19 NOTE — Telephone Encounter (Signed)
Pt's pharmacy report was scanned to [facility], [facility] email. FYI

## 2023-03-19 NOTE — Telephone Encounter (Signed)
PAP: Application for ELIQUIS has been submitted to PAP Companies: [employer], via fax If status updated is requested please refer to [employer] at [phone]

## 2023-03-19 NOTE — Telephone Encounter (Signed)
Patient came by to drop off 2 [facility] Prescription Records for his patient assistance to provide to [facility] at 12:40 on 03/19/2023. Left at receptionist area for [facility] . Thank you

## 2023-03-22 NOTE — Progress Notes (Signed)
[facility] PREP Evaluation  Patient Details  Name: Lawrence Bailey MRN: [id number] Date of Birth: [birth year] Age: 82 y.o. PCP: Dorothyann Peng, MD  Vitals:   03/22/23 1612  BP: (!) 154/60  Pulse: 72  SpO2: 99%  Weight: 182 lb (82.6 kg)     [facility] Eval - 03/22/23 1600       [facility] "PREP" Location   [facility] "PREP" Location [name]      Referral    Referring Provider Sanders    Reason for referral Hypertension;Inactivity    Program Start Date 03/22/23      Measurement   Waist Circumference 38 inches    Hip Circumference 40.5 inches    Body fat 29.2 percent      Information for Trainer   Goals --   Establish exercise and strength training program/routine   Current Exercise walking    Orthopedic Concerns --   limited ROM w/shoulders; some residual R side weakness from CVA; OA  both knees   Pertinent Medical History --   HTN, CVA, Aifb, CKD III, prostate CA     Mobility and Daily Activities   I find it easy to walk up or down two or more flights of stairs. 4    I have no trouble taking out the trash. 4    I do housework such as vacuuming and dusting on my own without difficulty. 4    I can easily lift a gallon of milk (8lbs). 4    I can easily walk a mile. 4    I have no trouble reaching into high cupboards or reaching down to pick up something from the floor. 2    I do not have trouble doing out-door work such as Loss adjuster, chartered, raking leaves, or gardening. 4      Mobility and Daily Activities   I feel younger than my age. 2    I feel independent. 4    I feel energetic. 2    I live an active life.  3    I feel strong. 2    I feel healthy. 2    I feel active as other people my age. 2      How fit and strong are you.   Fit and Strong Total Score 43            Past Medical History:  Diagnosis Date   BPH (benign prostatic hyperplasia)    Coronary artery disease    GIST (gastrointestinal stromal tumor), malignant (HCC) dx'd 04/2007   gleevac comp 04/2008    Glaucoma    Hypertension    Prostate cancer (HCC)    Renal insufficiency    Past Surgical History:  Procedure Laterality Date   ABDOMINAL SURGERY     EYE SURGERY     HERNIA REPAIR     KNEE SURGERY     Social History   Tobacco Use  Smoking Status Never  Smokeless Tobacco Never  To start PREP class at [name] today, every M/W 2:30-3:45  [name] 03/22/2023, 4:17 PM

## 2023-03-22 NOTE — Progress Notes (Signed)
Goal setting and welcome to the program; introductions, review of notebook, tour of facility

## 2023-03-23 DIAGNOSIS — G4733 Obstructive sleep apnea (adult) (pediatric): Secondary | ICD-10-CM | POA: Diagnosis not present

## 2023-03-26 NOTE — Telephone Encounter (Signed)
PAP: Patient assistance application for Eliquis has been approved by PAP Companies: [name] from 03/26/23 to 05/25/23. Medication should be delivered to PAP Delivery: Home For further shipping updates, please contact [name] ([employer]) at [phone] Pt ID is: [id number]   Copy of fax determination was emailed to [name] 03/31/23

## 2023-03-29 ENCOUNTER — Encounter: Payer: Self-pay | Admitting: Internal Medicine

## 2023-03-29 ENCOUNTER — Telehealth: Payer: Self-pay

## 2023-03-29 NOTE — Telephone Encounter (Signed)
Patient sent message about headache, and wheezing. Patient was offered an appt today and tomorrow. Patient declined both and reports he will see how he does the rest of the day and let us know.

## 2023-03-29 NOTE — Telephone Encounter (Signed)
Error - not already put in

## 2023-03-30 ENCOUNTER — Ambulatory Visit: Payer: Self-pay | Admitting: Internal Medicine

## 2023-03-30 ENCOUNTER — Encounter (HOSPITAL_COMMUNITY): Payer: Self-pay

## 2023-03-30 ENCOUNTER — Ambulatory Visit (HOSPITAL_COMMUNITY)
Admission: RE | Admit: 2023-03-30 | Discharge: 2023-03-30 | Disposition: A | Discharge status: Home / Self Care | Payer: No Typology Code available for payment source | Source: Ambulatory Visit | Attending: Family Medicine | Admitting: Family Medicine

## 2023-03-30 VITALS — BP 127/58 | HR 76 | Temp 97.7°F | Resp 16

## 2023-03-30 DIAGNOSIS — Z1152 Encounter for screening for COVID-19: Secondary | ICD-10-CM | POA: Insufficient documentation

## 2023-03-30 DIAGNOSIS — J069 Acute upper respiratory infection, unspecified: Secondary | ICD-10-CM | POA: Diagnosis not present

## 2023-03-30 DIAGNOSIS — B9789 Other viral agents as the cause of diseases classified elsewhere: Secondary | ICD-10-CM | POA: Diagnosis not present

## 2023-03-30 LAB — POC COVID19/FLU A&B COMBO
Covid Antigen, POC: NEGATIVE
Influenza A Antigen, POC: NEGATIVE
Influenza B Antigen, POC: NEGATIVE

## 2023-03-30 NOTE — Discharge Instructions (Signed)
The rapid test for COVID and flu were negative.   You have been swabbed for COVID, and the test will result in the next 24 hours. Our staff will call you if positive. If the COVID test is positive, you should quarantine until you are fever free for 24 hours and you are starting to feel better, and then take added precautions for the next 5 days, such as physical distancing/wearing a mask and good hand hygiene/washing.  Continue Tylenol as needed for pain or fever.  You can take Robitussin or Mucinex as needed for congestion.

## 2023-03-30 NOTE — ED Provider Notes (Signed)
[facility]    CSN: [id number] Arrival date & time: 03/30/23  1207      History   Chief Complaint Chief Complaint  Patient presents with   Headache    Pain in my body, shortness of breath - Entered by patient    HPI Lawrence Bailey is a 82 y.o. male.    Headache Here for rhinorrhea and nasal congestion and myalgia that began yesterday morning.  He has not had any fever or chills.  No nausea vomiting or diarrhea.  He has not been coughing much. No sore throat and no ear pain  He has had a little bit of shortness of breath, mainly when he is moving around.  He has been taking Tylenol which does help some.  Past medical history significant for atrial fibrillation and he does take Eliquis.   Past Medical History:  Diagnosis Date   BPH (benign prostatic hyperplasia)    Coronary artery disease    GIST (gastrointestinal stromal tumor), malignant (HCC) dx'd 04/2007   gleevac comp 04/2008   Glaucoma    Hypertension    Prostate cancer [facility])    Renal insufficiency     Patient Active Problem List   Diagnosis Date Noted   Nephrolithiasis 02/22/2023   Normocytic anemia 01/31/2023   Acute lower UTI (urinary tract infection) 01/31/2023   Moderate protein malnutrition (HCC) 01/31/2023   Hyponatremia 01/31/2023   Hypokalemia 01/31/2023   Thrombocytopenia (HCC) 01/31/2023   Hyperlipidemia 01/31/2023   Grade II diastolic dysfunction 01/31/2023   Generalized abdominal pain 12/30/2022   Early satiety 12/30/2022   Gross hematuria 12/30/2022   Acute cystitis with hematuria 12/30/2022   Anemia 12/30/2022   Left temporal headache 12/22/2022   Close exposure to COVID-19 virus 12/22/2022   BMI 26.0-26.9,adult 12/22/2022   Paroxysmal atrial fibrillation (HCC) 09/10/2022   Acquired thrombophilia (HCC) 09/10/2022   Falling episodes 12/12/2021   Bilateral impacted cerumen 07/27/2021   Paresthesia of both feet 07/27/2021   Fall (on) (from) other stairs and steps, initial  encounter 07/27/2021   Recurrent falls 07/27/2021   Monoclonal gammopathy of unknown significance (MGUS) 07/27/2021   Chest pain 08/28/2020   Constipation 08/28/2020   Cough 08/28/2020   Dysphagia 08/28/2020   Hemorrhage of rectum and anus 08/28/2020   Atherosclerosis of aorta (HCC) 07/09/2020   Hypertensive heart and renal disease 05/02/2020   Malignant neoplasm of prostate (HCC) 07/11/2019   Glaucoma 02/28/2018   Syncope and collapse    Syncope 11/08/2016   Stage 3 chronic kidney disease (HCC) 11/08/2016   Essential hypertension 11/08/2016   Hyperglycemia 11/08/2016   Pulmonary contusion 02/04/2012   Thigh hematoma 02/04/2012   H/O malignant gastrointestinal stromal tumor (GIST) 07/20/2011    Past Surgical History:  Procedure Laterality Date   ABDOMINAL SURGERY     EYE SURGERY     HERNIA REPAIR     KNEE SURGERY         Home Medications    Prior to Admission medications   Medication Sig Start Date End Date Taking? Authorizing Provider  amLODipine (NORVASC) 10 MG tablet Take 10 mg by mouth daily. 04/13/17   [provider]  apixaban (ELIQUIS) 5 MG TABS tablet Take 1 tablet (5 mg total) by mouth 2 (two) times daily. 02/16/23   Quintella Reichert, MD  apixaban (ELIQUIS) 5 MG TABS tablet Take 1 tablet (5 mg total) by mouth 2 (two) times daily. 03/01/23   Quintella Reichert, MD  apixaban (ELIQUIS) 5 MG TABS  tablet Take 1 tablet (5 mg total) by mouth 2 (two) times daily. 03/15/23   Quintella Reichert, MD  atorvastatin (LIPITOR) 10 MG tablet TAKE 1 TABLET BY MOUTH MONDAY - FRIDAY AT BEDTIME Patient taking differently: Take 10 mg by mouth See admin instructions. Take 1 tablet by mouth Monday - Friday at bedtime 12/14/22   Dorothyann Peng, MD  brimonidine [facility]) 0.2 % ophthalmic solution 1 drop 2 (two) times daily. 04/25/19   [provider]  docusate sodium (COLACE) 100 MG capsule Take 100 mg by mouth daily as needed for mild constipation.    [provider]   finasteride (PROSCAR) 5 MG tablet Take 5 mg by mouth daily. 11/14/21   [provider]  fluticasone (FLONASE) 50 MCG/ACT nasal spray INHALE 2 SPRAYS INTO EACH NOSTRIL EVERY NIGHT Patient taking differently: Place 2 sprays into both nostrils See admin instructions. INHALE 2 SPRAYS INTO EACH NOSTRIL EVERY NIGHT 11/09/22   Dorothyann Peng, MD  latanoprost (XALATAN) 0.005 % ophthalmic solution Place 1 drop into both eyes at bedtime.    [provider]  polyethylene glycol (MIRALAX / GLYCOLAX) 17 g packet Take 17 g by mouth daily. 02/03/23   Berton Mount I, MD  tamsulosin (FLOMAX) 0.4 MG CAPS capsule Take 0.4 mg by mouth daily.    [provider]  traMADol (ULTRAM) 50 MG tablet Take 1 tablet (50 mg total) by mouth every 12 (twelve) hours as needed. 02/04/23 02/04/24  Dorothyann Peng, MD  valsartan (DIOVAN) 40 MG tablet Take 1 tablet (40 mg total) by mouth daily. 02/02/23 04/03/23  Barnetta Chapel, MD    Family History Family History  Problem Relation Age of Onset   Hypertension Mother    Cancer Mother    Breast cancer Neg Hx    Prostate cancer Neg Hx    Colon cancer Neg Hx    Pancreatic cancer Neg Hx     Social History Social History   Tobacco Use   Smoking status: Never   Smokeless tobacco: Never  Vaping Use   Vaping status: Never Used  Substance Use Topics   Alcohol use: Not Currently   Drug use: No     Allergies   Other and Sucralfate   Review of Systems Review of Systems  Neurological:  Positive for headaches.     Physical Exam Triage Vital Signs ED Triage Vitals  Encounter Vitals Group     BP 03/30/23 1225 (!) 127/58     Systolic BP Percentile --      Diastolic BP Percentile --      Pulse Rate 03/30/23 1225 76     Resp 03/30/23 1225 16     Temp 03/30/23 1225 97.7 F (36.5 C)     Temp Source 03/30/23 1225 Oral     SpO2 03/30/23 1225 98 %     Weight --      Height --      Head Circumference --      Peak Flow --      Pain Score  03/30/23 1227 0     Pain Loc --      Pain Education --      Exclude from Growth Chart --    No data found.  Updated Vital Signs BP (!) 127/58 (BP Location: Left Arm)   Pulse 76   Temp 97.7 F (36.5 C) (Oral)   Resp 16   SpO2 98%   Visual Acuity Right Eye Distance:   Left Eye Distance:  Bilateral Distance:    Right Eye Near:   Left Eye Near:    Bilateral Near:     Physical Exam Vitals reviewed.  Constitutional:      General: He is not in acute distress.    Appearance: He is not ill-appearing, toxic-appearing or diaphoretic.  HENT:     Ears:     Comments: Bilaterally tympanic membranes are obscured by light brown cerumen.    Nose: Congestion present.     Mouth/Throat:     Mouth: Mucous membranes are moist.     Pharynx: No oropharyngeal exudate or posterior oropharyngeal erythema.  Eyes:     Extraocular Movements: Extraocular movements intact.     Conjunctiva/sclera: Conjunctivae normal.     Pupils: Pupils are equal, round, and reactive to light.  Cardiovascular:     Rate and Rhythm: Normal rate. Rhythm irregular.     Heart sounds: No murmur heard. Pulmonary:     Effort: Pulmonary effort is normal. No respiratory distress.     Breath sounds: No stridor. No wheezing, rhonchi or rales.  Musculoskeletal:     Cervical back: Neck supple.  Lymphadenopathy:     Cervical: No cervical adenopathy.  Skin:    Capillary Refill: Capillary refill takes less than 2 seconds.     Coloration: Skin is not jaundiced or pale.  Neurological:     General: No focal deficit present.     Mental Status: He is alert and oriented to person, place, and time.  Psychiatric:        Behavior: Behavior normal.      UC Treatments / Results  Labs (all labs ordered are listed, but only abnormal results are displayed) Labs Reviewed  SARS CORONAVIRUS 2 (TAT 6-24 HRS)  POC COVID19/FLU A&B COMBO    EKG   Radiology No results found.  Procedures Procedures (including critical care  time)  Medications Ordered in UC Medications - No data to display  Initial Impression / Assessment and Plan / UC Course  I have reviewed the triage vital signs and the nursing notes.  Pertinent labs & imaging results that were available during my care of the patient were reviewed by me and considered in my medical decision making (see chart for details).     Rapid flu and COVID tests are negative here.  I have discussed further with the patient and a PCR COVID swab is done here to confirm.  If it is positive he will need a prescription for molnupiravir to prevent severe COVID disease  He cannot take Paxlovid due to his being on Eliquis for his atrial fibrillation.  He will continue Tylenol as needed for the myalgia and headache and Robitussin or Mucinex as needed for the congestion Final Clinical Impressions(s) / UC Diagnoses   Final diagnoses:  Viral upper respiratory tract infection     Discharge Instructions      The rapid test for COVID and flu were negative.   You have been swabbed for COVID, and the test will result in the next 24 hours. Our staff will call you if positive. If the COVID test is positive, you should quarantine until you are fever free for 24 hours and you are starting to feel better, and then take added precautions for the next 5 days, such as physical distancing/wearing a mask and good hand hygiene/washing.  Continue Tylenol as needed for pain or fever.  You can take Robitussin or Mucinex as needed for congestion.     ED Prescriptions  None    PDMP not reviewed this encounter.   Zenia Resides, MD 03/30/23 [phone]

## 2023-03-30 NOTE — ED Triage Notes (Addendum)
Pt states head pressure and congestion for the past 2 days. Also states he feels SOB at times. States he has been taking Tylenol at home.

## 2023-03-31 ENCOUNTER — Encounter: Payer: Self-pay | Admitting: Cardiology

## 2023-03-31 ENCOUNTER — Ambulatory Visit: Payer: No Typology Code available for payment source | Attending: Cardiology | Admitting: Cardiology

## 2023-03-31 VITALS — BP 126/80 | HR 76 | Ht 68.0 in | Wt 172.0 lb

## 2023-03-31 DIAGNOSIS — I10 Essential (primary) hypertension: Secondary | ICD-10-CM | POA: Diagnosis not present

## 2023-03-31 DIAGNOSIS — G4733 Obstructive sleep apnea (adult) (pediatric): Secondary | ICD-10-CM

## 2023-03-31 LAB — SARS CORONAVIRUS 2 (TAT 6-24 HRS): SARS Coronavirus 2: NEGATIVE

## 2023-03-31 NOTE — Telephone Encounter (Signed)
Please see encounter from 03/03/23

## 2023-03-31 NOTE — Patient Instructions (Signed)
Medication Instructions:  Your physician recommends that you continue on your current medications as directed. Please refer to the Current Medication list given to you today.  *If you need a refill on your cardiac medications before your next appointment, please call your pharmacy*   Lab Work: None.  If you have labs (blood work) drawn today and your tests are completely normal, you will receive your results only by: MyChart Message (if you have MyChart) OR A paper copy in the mail If you have any lab test that is abnormal or we need to change your treatment, we will call you to review the results.   Testing/Procedures: None.   Follow-Up:  Your next appointment:   2 month(s)  Provider:   Dr. Armanda Magic, MD   Other Instructions Dr. Mayford Knife has ordered a new DME mask fitting to have you try a nasal pillow mask with a chin strap. Someone from your DME company may call you, or you may call them to set up an appointment.

## 2023-03-31 NOTE — Progress Notes (Signed)
Sleep Medicine CONSULT Note    Date:  03/31/2023   ID:  Charlett Lango, DOB 07-12-1940, MRN [id number]  PCP:  Dorothyann Peng, MD  Cardiologist: Lesleigh Noe, MD (Inactive)   Chief Complaint  Patient presents with   New Patient (Initial Visit)    Obstructive sleep apnea    History of Present Illness:  Lawrence Bailey is a 82 y.o. male who is being seen today for the evaluation of obstructive sleep apnea at the request of Tamera Stands, MD.  This is an 82 year old male with a history of CAD, PAF, recurrent syncope, hypertension and prostate cancer who is followed by EP for PAF and recurrent syncope.  Due to paroxysmal atrial fibrillation a sleep study was ordered because his episodes occur predominantly at night and his wife was complaining that he was snoring.  He underwent home sleep study showing mild obstructive sleep apnea with an AHI of 9.3/h overall and moderate sleep apnea during REM sleep with REM AHI 17/h.  He was started on auto CPAP from 4 to 15 cm H2O.  He is now referred for sleep medicine consultation to establish sleep care and treatment of his obstructive sleep apnea.  He is doing well with his PAP device and thinks that he has gotten used to it.  He tolerates the full face mask but it has been leaking likely from his facial hair.  He has not really noticed a difference in how he feels when he uses the device.He denies any significant mouth or nasal dryness or nasal congestion.   Past Medical History:  Diagnosis Date   BPH (benign prostatic hyperplasia)    Coronary artery disease    GIST (gastrointestinal stromal tumor), malignant (HCC) dx'd 04/2007   gleevac comp 04/2008   Glaucoma    Hypertension    Prostate cancer [facility])    Renal insufficiency     Past Surgical History:  Procedure Laterality Date   ABDOMINAL SURGERY     EYE SURGERY     HERNIA REPAIR     KNEE SURGERY      Current Medications: No outpatient medications have been marked as taking  for the 03/31/23 encounter (Office Visit) with Quintella Reichert, MD.    Allergies:   Other and Sucralfate   Social History   Socioeconomic History   Marital status: Married    Spouse name: Not on file   Number of children: Not on file   Years of education: Not on file   Highest education level: 11th grade  Occupational History   Occupation: retired  Tobacco Use   Smoking status: Never   Smokeless tobacco: Never  Vaping Use   Vaping status: Never Used  Substance and Sexual Activity   Alcohol use: Not Currently   Drug use: No   Sexual activity: Yes  Other Topics Concern   Not on file  Social History Narrative   Not on file   Social Determinants of Health   Financial Resource Strain: Low Risk  (12/28/2022)   Overall Financial Resource Strain (CARDIA)    Difficulty of Paying Living Expenses: Not hard at all  Food Insecurity: No Food Insecurity (02/04/2023)   Hunger Vital Sign    Worried About Running Out of Food in the Last Year: Never true    Ran Out of Food in the Last Year: Never true  Transportation Needs: No Transportation Needs (02/04/2023)   PRAPARE - Administrator, Civil Service (Medical): No  Lack of Transportation (Non-Medical): No  Physical Activity: Inactive (12/28/2022)   Exercise Vital Sign    Days of Exercise per Week: 0 days    Minutes of Exercise per Session: 10 min  Stress: No Stress Concern Present (12/28/2022)   [employer] of Occupational Health - Occupational Stress Questionnaire    Feeling of Stress : Not at all  Social Connections: Unknown (12/28/2022)   Social Connection and Isolation Panel [NHANES]    Frequency of Communication with Friends and Family: More than three times a week    Frequency of Social Gatherings with Friends and Family: Patient declined    Attends Religious Services: Patient declined    Database administrator or Organizations: No    Attends Banker Meetings: Never    Marital Status: Married  Recent  Concern: Social Connections - Moderately Isolated (12/10/2022)   Social Connection and Isolation Panel [NHANES]    Frequency of Communication with Friends and Family: Once a week    Frequency of Social Gatherings with Friends and Family: Three times a week    Attends Religious Services: Never    Active Member of Clubs or Organizations: No    Attends Banker Meetings: Never    Marital Status: Married     Family History:  The patient's family history includes Cancer in his mother; Hypertension in his mother.   ROS:   Please see the history of present illness.    ROS All other systems reviewed and are negative.      No data to display             PHYSICAL EXAM:   VS:  There were no vitals taken for this visit.   GEN: Well nourished, well developed, in no acute distress  HEENT: normal  Neck: no JVD, carotid bruits, or masses Cardiac: RRR; no murmurs, rubs, or gallops,no edema.  Intact distal pulses bilaterally.  Respiratory:  clear to auscultation bilaterally, normal work of breathing GI: soft, nontender, nondistended, + BS MS: no deformity or atrophy  Skin: warm and dry, no rash Neuro:  Alert and Oriented x 3, Strength and sensation are intact Psych: euthymic mood, full affect  Wt Readings from Last 3 Encounters:  03/22/23 182 lb (82.6 kg)  02/24/23 177 lb 11.2 oz (80.6 kg)  02/12/23 180 lb (81.6 kg)      Studies/Labs Reviewed:   Home sleep study and PAP compliance download  Recent Labs: 12/10/2022: TSH 1.330 02/02/2023: Magnesium 2.2 02/24/2023: ALT 11; BUN 24; Creatinine 1.26; Hemoglobin 10.2; Platelet Count 145; Potassium 4.2; Sodium 135    ASSESSMENT:    1. OSA (obstructive sleep apnea)   2. Essential hypertension      PLAN:  In order of problems listed above:  #OSA - The patient is tolerating PAP therapy well without any problems. The PAP download performed by his DME was personally reviewed and interpreted by me today and showed an AHI of  6.4 /hr on auto CPAP of 4-15 cm H2O with 90% compliance in using more than 4 hours nightly.  The patient has been using and benefiting from PAP use and will continue to benefit from therapy -AHI is mildly elevated but likely related to severe mask leak..  -I am going to get him back in with his DME and get fitted for a nasal pillow mask with chin strap -Repeat download in 6 weeks  #Hypertension -BP controlled on exam today -Continue prescription drug managed with amlodipine 10 mg daily and  valsartan 40 mg daily with as needed refills   Time Spent: 20 minutes total time of encounter, including 15 minutes spent in face-to-face patient care on the date of this encounter. This time includes coordination of care and counseling regarding above mentioned problem list. Remainder of non-face-to-face time involved reviewing chart documents/testing relevant to the patient encounter and documentation in the medical record. I have independently reviewed documentation from referring provider  Medication Adjustments/Labs and Tests Ordered: Current medicines are reviewed at length with the patient today.  Concerns regarding medicines are outlined above.  Medication changes, Labs and Tests ordered today are listed in the Patient Instructions below.  There are no Patient Instructions on file for this visit.   Signed, Armanda Magic, MD  03/31/2023 10:32 AM    [facility] [address] [place], [place], [place]  [address] Phone: [phone]; Fax: [phone]

## 2023-04-01 ENCOUNTER — Other Ambulatory Visit: Payer: Self-pay | Admitting: Internal Medicine

## 2023-04-01 ENCOUNTER — Encounter: Payer: Self-pay | Admitting: Internal Medicine

## 2023-04-01 MED ORDER — VALSARTAN 40 MG PO TABS: 40.0000 mg | ORAL_TABLET | Freq: Every day | ORAL | 0 refills | Status: DC

## 2023-04-06 ENCOUNTER — Ambulatory Visit: Payer: No Typology Code available for payment source | Attending: Internal Medicine | Admitting: Internal Medicine

## 2023-04-06 ENCOUNTER — Encounter: Payer: Self-pay | Admitting: Internal Medicine

## 2023-04-06 VITALS — BP 130/70 | HR 71 | Ht 68.0 in | Wt 181.0 lb

## 2023-04-06 DIAGNOSIS — N1831 Chronic kidney disease, stage 3a: Secondary | ICD-10-CM | POA: Diagnosis not present

## 2023-04-06 DIAGNOSIS — I10 Essential (primary) hypertension: Secondary | ICD-10-CM

## 2023-04-06 DIAGNOSIS — E782 Mixed hyperlipidemia: Secondary | ICD-10-CM

## 2023-04-06 DIAGNOSIS — I700 Atherosclerosis of aorta: Secondary | ICD-10-CM | POA: Diagnosis not present

## 2023-04-06 DIAGNOSIS — D6869 Other thrombophilia: Secondary | ICD-10-CM

## 2023-04-06 DIAGNOSIS — I480 Paroxysmal atrial fibrillation: Secondary | ICD-10-CM | POA: Diagnosis not present

## 2023-04-06 NOTE — Patient Instructions (Signed)

## 2023-04-06 NOTE — Progress Notes (Signed)
Cardiology Office Note:    Date:  04/06/2023   ID:  Lawrence Bailey, DOB April 06, 1941, MRN [id number]  PCP:  Dorothyann Peng, MD  Cardiologist:  Lesleigh Noe, MD (Inactive)   Referring MD: Dorothyann Peng, MD   CC:  Transition to new cardiologist  History of Present Illness:    Lawrence Bailey is a 82 y.o. male with a hx of aortic atherosclerosis, CAD, AF with prior stroke, ane CKD who presents to transition his care.  Discussed the use of AI scribe software for clinical note transcription with the patient, who gave verbal consent to proceed.  Lawrence Bailey, an 82 year old individual with a history of aortic atherosclerosis, coronary artery disease, CKD stage IIIA, , atrial fibrillation, and obstructive sleep apnea, presents for a cardiology evaluation. The patient has been recently managed by a sleep medicine specialist and an electrophysiologist for the aforementioned conditions. The patient was recently started on blood pressure medication and therapy for obstructive sleep apnea was augmented.  The patient has a long-standing history of atrial fibrillation since childhood, which is currently managed with Eliquis. The patient's blood pressure is well-controlled on amlodipine and valsartan, and cholesterol is managed with atorvastatin. The patient reports feeling generally well, with no significant complaints. However, the patient has been experiencing some difficulty with the management of obstructive sleep apnea, not noticing the expected benefits from the treatment.  The patient leads an active lifestyle, engaging in household chores and yard work, although at a reduced pace due to the heat and a recent viral illness. The patient has recently started an exercise program. The patient reports no bleeding issues, but has experienced some breathlessness after exertion, particularly when climbing stairs, which was more pronounced during the recent illness. The patient's blood counts have been  slightly low, indicating mild anemia.   Past Medical History:  Diagnosis Date   BPH (benign prostatic hyperplasia)    Coronary artery disease    GIST (gastrointestinal stromal tumor), malignant (HCC) dx'd 04/2007   gleevac comp 04/2008   Glaucoma    Hypertension    Prostate cancer (HCC)    Renal insufficiency     Past Surgical History:  Procedure Laterality Date   ABDOMINAL SURGERY     EYE SURGERY     HERNIA REPAIR     KNEE SURGERY      Current Medications: Current Meds  Medication Sig   amLODipine (NORVASC) 10 MG tablet Take 10 mg by mouth daily.   apixaban (ELIQUIS) 5 MG TABS tablet Take 1 tablet (5 mg total) by mouth 2 (two) times daily.   atorvastatin (LIPITOR) 10 MG tablet TAKE 1 TABLET BY MOUTH MONDAY - FRIDAY AT BEDTIME (Patient taking differently: Take 10 mg by mouth See admin instructions. Take 1 tablet by mouth Monday - Friday at bedtime)   brimonidine (ALPHAGAN) 0.2 % ophthalmic solution 1 drop 2 (two) times daily.   COSOPT PF 2-0.5 % SOLN Apply 1 drop to eye 2 (two) times daily.   docusate sodium (COLACE) 100 MG capsule Take 100 mg by mouth daily as needed for mild constipation.   finasteride (PROSCAR) 5 MG tablet Take 5 mg by mouth daily.   fluticasone (FLONASE) 50 MCG/ACT nasal spray INHALE 2 SPRAYS INTO EACH NOSTRIL EVERY NIGHT (Patient taking differently: Place 2 sprays into both nostrils See admin instructions. INHALE 2 SPRAYS INTO EACH NOSTRIL EVERY NIGHT)   latanoprost (XALATAN) 0.005 % ophthalmic solution Place 1 drop into both eyes at bedtime.   polyethylene glycol (MIRALAX /  GLYCOLAX) 17 g packet Take 17 g by mouth daily. (Patient taking differently: Take 17 g by mouth as needed for moderate constipation.)   sildenafil (REVATIO) 20 MG tablet    tamsulosin (FLOMAX) 0.4 MG CAPS capsule Take 0.4 mg by mouth daily.   traMADol (ULTRAM) 50 MG tablet Take 1 tablet (50 mg total) by mouth every 12 (twelve) hours as needed.   valsartan (DIOVAN) 40 MG tablet Take 1  tablet (40 mg total) by mouth daily.     Allergies:   Other and Sucralfate   Social History   Socioeconomic History   Marital status: Married    Spouse name: Not on file   Number of children: Not on file   Years of education: Not on file   Highest education level: 11th grade  Occupational History   Occupation: retired  Tobacco Use   Smoking status: Never   Smokeless tobacco: Never  Vaping Use   Vaping status: Never Used  Substance and Sexual Activity   Alcohol use: Not Currently   Drug use: No   Sexual activity: Yes  Other Topics Concern   Not on file  Social History Narrative   Not on file   Social Determinants of Health   Financial Resource Strain: Low Risk  (12/28/2022)   Overall Financial Resource Strain (CARDIA)    Difficulty of Paying Living Expenses: Not hard at all  Food Insecurity: No Food Insecurity (02/04/2023)   Hunger Vital Sign    Worried About Running Out of Food in the Last Year: Never true    Ran Out of Food in the Last Year: Never true  Transportation Needs: No Transportation Needs (02/04/2023)   PRAPARE - Administrator, Civil Service (Medical): No    Lack of Transportation (Non-Medical): No  Physical Activity: Inactive (12/28/2022)   Exercise Vital Sign    Days of Exercise per Week: 0 days    Minutes of Exercise per Session: 10 min  Stress: No Stress Concern Present (12/28/2022)   [facility] [employer] - Occupational Stress Questionnaire    Feeling of Stress : Not at all  Social Connections: Unknown (12/28/2022)   Social Connection and Isolation Panel [NHANES]    Frequency of Communication with Friends and Family: More than three times a week    Frequency of Social Gatherings with Friends and Family: Patient declined    Attends Religious Services: Patient declined    Database administrator or Organizations: No    Attends Banker Meetings: Never    Marital Status: Married  Recent Concern: Social Connections -  Moderately Isolated (12/10/2022)   Social Connection and Isolation Panel [NHANES]    Frequency of Communication with Friends and Family: Once a week    Frequency of Social Gatherings with Friends and Family: Three times a week    Attends Religious Services: Never    Active Member of Clubs or Organizations: No    Attends Banker Meetings: Never    Marital Status: Married     Family History: The patient's family history includes Cancer in his mother; Hypertension in his mother. There is no history of Breast cancer, Prostate cancer, Colon cancer, or Pancreatic cancer.  ROS:   Please see the history of present illness.    Intermittent orthostatic dizziness all other systems reviewed and are negative.  EKGs/Labs/Other Studies Reviewed:    The following studies were reviewed today:  Recent Labs: 12/10/2022: TSH 1.330 02/02/2023: Magnesium 2.2 02/24/2023: ALT 11;  BUN 24; Creatinine 1.26; Hemoglobin 10.2; Platelet Count 145; Potassium 4.2; Sodium 135  Recent Lipid Panel    Component Value Date/Time   CHOL 117 12/10/2022 1509   TRIG 57 12/10/2022 1509   HDL 54 12/10/2022 1509   CHOLHDL 2.2 12/10/2022 1509   LDLCALC 50 12/10/2022 1509    Physical Exam:    VS:  BP 130/70   Pulse 71   Ht 5\' 8"  (1.727 m)   Wt 181 lb (82.1 kg)   SpO2 97%   BMI 27.52 kg/m     Wt Readings from Last 3 Encounters:  04/06/23 181 lb (82.1 kg)  03/31/23 172 lb (78 kg)  03/22/23 182 lb (82.6 kg)    GEN: Healthy appearing. No acute distress HEENT: Normal NECK: No JVD. CARDIAC: No murmur. RRR, or edema. VASCULAR:  Normal Pulses.  RESPIRATORY:  Clear to auscultation without rales, wheezing or rhonchi  ABDOMEN: Soft, non-tender, non-distended MUSCULOSKELETAL: No deformity  SKIN: Warm and dry NEUROLOGIC:  Alert and oriented x 3 PSYCHIATRIC:  Normal affect   ASSESSMENT:    1. Mixed hyperlipidemia   2. Stage 3a chronic kidney disease (HCC)   3. Paroxysmal atrial fibrillation (HCC)   4.  Atherosclerosis of aorta (HCC)   5. Essential hypertension   6. Acquired thrombophilia (HCC)     PLAN:    Paroxsymal Atrial Fibrillation - CHADSVASC 6 with prior stroke managed with Eliquis.  No recent palpitations or chest pain. Blood pressure well controlled with amlodipine and valsartan. Emphasized adherence to anticoagulation to prevent stroke. - Continue Eliquis - Follow up with Dr. Nelly Laurence as needed  Anemia - Mild anemia with no current symptoms. Discussed monitoring hemoglobin levels and rechecking blood counts if new symptoms occur. - Monitor hemoglobin levels - Recheck blood counts if new symptoms of tiredness or weakness occur  Coronary Artery Disease HLD with Aortic atherosclerosis - Asymptomatic with well-controlled cholesterol levels. Emphasized continued statin therapy to maintain LDL levels and prevent future cardiac events. - Continue atorvastatin - continue current regimen  Hypertension Blood pressure well controlled with amlodipine and valsartan. Emphasized adherence to antihypertensive medications to prevent complications such as stroke and heart failure. - Continue current antihypertensive medications  Chronic Kidney Disease Stage 3A - CKD with creatinine level of 1.26. Discussed regular monitoring of renal function to detect progression. - Continue current management  Obstructive Sleep Apnea - Managed by Dr. Mayford Knife with suboptimal response to current therapy. Discussed alternative treatments including different CPAP masks, BiPAP, AutoPAP, mouth guards, and nerve stimulators. Emphasized finding effective treatment to improve quality of life.   General Health Maintenance - Engaged in preventive measures including exercise. Discussed importance of muscle maintenance and exercise to prevent falls and maintain functional status. Encouraged regular physical activity and consideration of colonoscopy or endoscopy if anemia worsens. - Continue exercise program at the  [facility] - Encourage regular physical activity - Consider colonoscopy or endoscopy if anemia worsens  Follow-up - Schedule follow-up appointment in one year - Immediate follow-up if new symptoms such as palpitations, chest pain, or worsening anemia occur.   Medication Adjustments/Labs and Tests Ordered: Current medicines are reviewed at length with the patient today.  Concerns regarding medicines are outlined above.  No orders of the defined types were placed in this encounter.  No orders of the defined types were placed in this encounter.   Patient Instructions  Medication Instructions:  Your physician recommends that you continue on your current medications as directed. Please refer to the Current Medication list given  to you today.  *If you need a refill on your cardiac medications before your next appointment, please call your pharmacy*   Lab Work: NONE If you have labs (blood work) drawn today and your tests are completely normal, you will receive your results only by: MyChart Message (if you have MyChart) OR A paper copy in the mail If you have any lab test that is abnormal or we need to change your treatment, we will call you to review the results.   Testing/Procedures: NONE   Follow-Up: At [facility], you and your health needs are our priority.  As part of our continuing mission to provide you with exceptional heart care, we have created designated Provider Care Teams.  These Care Teams include your primary Cardiologist (physician) and Advanced Practice Providers (APPs -  Physician Assistants and Nurse Practitioners) who all work together to provide you with the care you need, when you need it.     Your next appointment:   1 year(s)  Provider:   Riley Lam, MD       Signed, Christell Constant, MD  04/06/2023 2:39 PM    [facility]

## 2023-04-07 ENCOUNTER — Encounter: Payer: Self-pay | Admitting: Internal Medicine

## 2023-04-07 DIAGNOSIS — H2511 Age-related nuclear cataract, right eye: Secondary | ICD-10-CM | POA: Diagnosis not present

## 2023-04-07 DIAGNOSIS — H401113 Primary open-angle glaucoma, right eye, severe stage: Secondary | ICD-10-CM | POA: Diagnosis not present

## 2023-04-07 DIAGNOSIS — H04123 Dry eye syndrome of bilateral lacrimal glands: Secondary | ICD-10-CM | POA: Diagnosis not present

## 2023-04-07 DIAGNOSIS — R519 Headache, unspecified: Secondary | ICD-10-CM | POA: Diagnosis not present

## 2023-04-07 DIAGNOSIS — Z961 Presence of intraocular lens: Secondary | ICD-10-CM | POA: Diagnosis not present

## 2023-04-07 DIAGNOSIS — H401122 Primary open-angle glaucoma, left eye, moderate stage: Secondary | ICD-10-CM | POA: Diagnosis not present

## 2023-04-07 MED ORDER — VALSARTAN 40 MG PO TABS: 40.0000 mg | ORAL_TABLET | Freq: Every day | ORAL | 3 refills | Status: DC

## 2023-04-14 ENCOUNTER — Telehealth: Payer: Self-pay

## 2023-04-14 NOTE — Telephone Encounter (Signed)
Call to patient to advise that Dr. Mayford Knife will see him for sleep and cards. Patient verbalizes understanding.

## 2023-04-14 NOTE — Telephone Encounter (Signed)
-----   Message from Armanda Magic sent at 04/06/2023  8:45 AM EST ----- Regarding: RE: New patient Yes I can see him for both ----- Message ----- From: Luellen Pucker, RN Sent: 04/05/2023   6:38 PM EST To: Quintella Reichert, MD Subject: New patient                                    Former patient of Dr. Katrinka Blazing, apparently this patient is seen by you for sleep but has an appt with Dr. Izora Ribas tomorrow for cardiology follow up. Dr. Debby Bud nurse is wondering if it would be better to schedule him with you for future follow up for both? Alcario Drought

## 2023-04-16 ENCOUNTER — Other Ambulatory Visit (INDEPENDENT_AMBULATORY_CARE_PROVIDER_SITE_OTHER): Payer: No Typology Code available for payment source

## 2023-04-16 ENCOUNTER — Other Ambulatory Visit: Payer: Self-pay | Admitting: *Deleted

## 2023-04-16 DIAGNOSIS — D649 Anemia, unspecified: Secondary | ICD-10-CM

## 2023-04-16 LAB — CBC WITH DIFFERENTIAL/PLATELET
Basophils Absolute: 0 10*3/uL (ref 0.0–0.1)
Basophils Relative: 0.3 % (ref 0.0–3.0)
Eosinophils Absolute: 0 10*3/uL (ref 0.0–0.7)
Eosinophils Relative: 0.6 % (ref 0.0–5.0)
HCT: 33.7 % — ABNORMAL LOW (ref 39.0–52.0)
Hemoglobin: 10.7 g/dL — ABNORMAL LOW (ref 13.0–17.0)
Lymphocytes Relative: 23.3 % (ref 12.0–46.0)
Lymphs Abs: 0.9 10*3/uL (ref 0.7–4.0)
MCHC: 31.6 g/dL (ref 30.0–36.0)
MCV: 84.2 fL (ref 78.0–100.0)
Monocytes Absolute: 0.5 10*3/uL (ref 0.1–1.0)
Monocytes Relative: 12.6 % — ABNORMAL HIGH (ref 3.0–12.0)
Neutro Abs: 2.5 10*3/uL (ref 1.4–7.7)
Neutrophils Relative %: 63.2 % (ref 43.0–77.0)
Platelets: 135 10*3/uL — ABNORMAL LOW (ref 150.0–400.0)
RBC: 4.01 Mil/uL — ABNORMAL LOW (ref 4.22–5.81)
RDW: 15.3 % (ref 11.5–15.5)
WBC: 4 10*3/uL (ref 4.0–10.5)

## 2023-04-16 LAB — COMPREHENSIVE METABOLIC PANEL
ALT: 12 U/L (ref 0–53)
AST: 18 U/L (ref 0–37)
Albumin: 3.5 g/dL (ref 3.5–5.2)
Alkaline Phosphatase: 48 U/L (ref 39–117)
BUN: 29 mg/dL — ABNORMAL HIGH (ref 6–23)
CO2: 31 meq/L (ref 19–32)
Calcium: 9.2 mg/dL (ref 8.4–10.5)
Chloride: 102 meq/L (ref 96–112)
Creatinine, Ser: 1.3 mg/dL (ref 0.40–1.50)
GFR: 50.97 mL/min — ABNORMAL LOW (ref >60.00)
Glucose, Bld: 110 mg/dL — ABNORMAL HIGH (ref 70–99)
Potassium: 3.8 meq/L (ref 3.5–5.1)
Sodium: 135 meq/L (ref 135–145)
Total Bilirubin: 0.4 mg/dL (ref 0.2–1.2)
Total Protein: 8.2 g/dL (ref 6.0–8.3)

## 2023-04-16 LAB — IBC + FERRITIN
Ferritin: 40.7 ng/mL (ref 22.0–322.0)
Iron: 35 ug/dL — ABNORMAL LOW (ref 42–165)
Saturation Ratios: 13.2 % — ABNORMAL LOW (ref 20.0–50.0)
TIBC: 266 ug/dL (ref 250.0–450.0)
Transferrin: 190 mg/dL — ABNORMAL LOW (ref 212.0–360.0)

## 2023-04-16 LAB — B12 AND FOLATE PANEL
Folate: >24.2 ng/mL (ref >5.9)
Vitamin B-12: 450 pg/mL (ref 211–911)

## 2023-04-23 DIAGNOSIS — G4733 Obstructive sleep apnea (adult) (pediatric): Secondary | ICD-10-CM | POA: Diagnosis not present

## 2023-04-26 NOTE — Progress Notes (Signed)
[facility] PREP Weekly Session  Patient Details  Name: Lawrence Bailey MRN: [id number] Date of Birth: November 18, 1940 Age: 82 y.o. PCP: Dorothyann Peng, MD  Vitals:   04/26/23 1602  Weight: 176 lb 12.8 oz (80.2 kg)     [facility] Weekly seesion - 04/26/23 1600       [facility] "PREP" Location   [facility] "PREP" Location [name]      Weekly Session   Topic Discussed Restaurant Eating   Limit salt intake to 1500-2300 mg/day; salt demo   Classes attended to date 5             Shellye Zandi B Glendon Fiser 04/26/2023, 4:03 PM

## 2023-05-03 NOTE — Addendum Note (Signed)
Addended by: Brunetta Genera on: 05/03/2023 02:09 PM   Modules accepted: Orders

## 2023-05-03 NOTE — Addendum Note (Signed)
Addended by: Brunetta Genera on: 05/03/2023 02:11 PM   Modules accepted: Orders

## 2023-05-03 NOTE — Progress Notes (Signed)
[facility] PREP Weekly Session  Patient Details  Name: Lawrence Bailey MRN: [id number] Date of Birth: December 19, 1940 Age: 82 y.o. PCP: Dorothyann Peng, MD  Vitals:   05/03/23 1540  Weight: 177 lb 9.6 oz (80.6 kg)     [facility] Weekly seesion - 05/03/23 1500       [facility] "PREP" Location   [facility] "PREP" Location [name]      Weekly Session   Topic Discussed Stress management and problem solving   Importance of sleep w/goal of 7-9 hrs/night; finger tip mudra breathwork exercise; sleep guidelines   Classes attended to date 7             Lawrence Bailey [name] 05/03/2023, 3:41 PM

## 2023-05-07 ENCOUNTER — Other Ambulatory Visit: Payer: Self-pay | Admitting: Internal Medicine

## 2023-05-10 NOTE — Progress Notes (Signed)
[facility] PREP Weekly Session  Patient Details  Name: NOHA KRIVANEK MRN: [id number] Date of Birth: 1940-12-05 Age: 82 y.o. PCP: Dorothyann Peng, MD  There were no vitals filed for this visit.   [facility] Weekly seesion - 05/10/23 1600       [facility] "PREP" Location   [facility] "PREP" Location [name]      Weekly Session   Topic Discussed Other   Portion control; visualize your portion size demo; review of [employer] food/nutrition label   Classes attended to date 81             Jalisa Sacco B Klayton Monie 05/10/2023, 4:34 PM

## 2023-05-21 ENCOUNTER — Other Ambulatory Visit: Payer: Self-pay | Admitting: Internal Medicine

## 2023-05-23 DIAGNOSIS — G4733 Obstructive sleep apnea (adult) (pediatric): Secondary | ICD-10-CM | POA: Diagnosis not present

## 2023-05-24 DIAGNOSIS — G4733 Obstructive sleep apnea (adult) (pediatric): Secondary | ICD-10-CM | POA: Diagnosis not present

## 2023-05-31 ENCOUNTER — Other Ambulatory Visit: Payer: Self-pay | Admitting: Internal Medicine

## 2023-05-31 DIAGNOSIS — G8929 Other chronic pain: Secondary | ICD-10-CM

## 2023-06-01 MED ORDER — TRAMADOL HCL 50 MG PO TABS: 50.0000 mg | ORAL_TABLET | Freq: Two times a day (BID) | ORAL | 0 refills | Status: AC | PRN

## 2023-06-02 NOTE — Progress Notes (Signed)
 Received message from his wife [place] that they have decided to withdraw from the PREP program.  Education classes attended: 4 Workout sessions completed: 4 Last class attended: 05/10/23

## 2023-06-08 ENCOUNTER — Other Ambulatory Visit (INDEPENDENT_AMBULATORY_CARE_PROVIDER_SITE_OTHER): Payer: No Typology Code available for payment source

## 2023-06-08 ENCOUNTER — Encounter: Payer: Self-pay | Admitting: Cardiology

## 2023-06-08 ENCOUNTER — Ambulatory Visit: Payer: No Typology Code available for payment source | Attending: Cardiology | Admitting: Cardiology

## 2023-06-08 VITALS — BP 130/70 | HR 71 | Ht 68.0 in | Wt 180.6 lb

## 2023-06-08 DIAGNOSIS — D649 Anemia, unspecified: Secondary | ICD-10-CM | POA: Diagnosis not present

## 2023-06-08 DIAGNOSIS — G4733 Obstructive sleep apnea (adult) (pediatric): Secondary | ICD-10-CM | POA: Insufficient documentation

## 2023-06-08 DIAGNOSIS — I10 Essential (primary) hypertension: Secondary | ICD-10-CM

## 2023-06-08 LAB — CBC WITH DIFFERENTIAL/PLATELET
Basophils Absolute: 0 10*3/uL (ref 0.0–0.1)
Basophils Relative: 0.3 % (ref 0.0–3.0)
Eosinophils Absolute: 0 10*3/uL (ref 0.0–0.7)
Eosinophils Relative: 0.9 % (ref 0.0–5.0)
HCT: 34.2 % — ABNORMAL LOW (ref 39.0–52.0)
Hemoglobin: 11 g/dL — ABNORMAL LOW (ref 13.0–17.0)
Lymphocytes Relative: 22 % (ref 12.0–46.0)
Lymphs Abs: 1 10*3/uL (ref 0.7–4.0)
MCHC: 32 g/dL (ref 30.0–36.0)
MCV: 83.5 fL (ref 78.0–100.0)
Monocytes Absolute: 0.2 10*3/uL (ref 0.1–1.0)
Monocytes Relative: 5.3 % (ref 3.0–12.0)
Neutro Abs: 3.2 10*3/uL (ref 1.4–7.7)
Neutrophils Relative %: 71.5 % (ref 43.0–77.0)
Platelets: 138 10*3/uL — ABNORMAL LOW (ref 150.0–400.0)
RBC: 4.1 Mil/uL — ABNORMAL LOW (ref 4.22–5.81)
RDW: 15.8 % — ABNORMAL HIGH (ref 11.5–15.5)
WBC: 4.5 10*3/uL (ref 4.0–10.5)

## 2023-06-08 LAB — IBC + FERRITIN
Ferritin: 25.5 ng/mL (ref 22.0–322.0)
Iron: 55 ug/dL (ref 42–165)
Saturation Ratios: 19.5 % — ABNORMAL LOW (ref 20.0–50.0)
TIBC: 281.4 ug/dL (ref 250.0–450.0)
Transferrin: 201 mg/dL — ABNORMAL LOW (ref 212.0–360.0)

## 2023-06-08 NOTE — Patient Instructions (Signed)
 Medication Instructions:  Your physician recommends that you continue on your current medications as directed. Please refer to the Current Medication list given to you today.  *If you need a refill on your cardiac medications before your next appointment, please call your pharmacy*   Lab Work: None.  If you have labs (blood work) drawn today and your tests are completely normal, you will receive your results only by: MyChart Message (if you have MyChart) OR A paper copy in the mail If you have any lab test that is abnormal or we need to change your treatment, we will call you to review the results.   Testing/Procedures: None.   Follow-Up:  Your next appointment:   1 year(s)  Provider:   Dr. Armanda Magic, MD

## 2023-06-08 NOTE — Progress Notes (Signed)
 Sleep Medicine Note    Date:  06/08/2023   ID:  Lawrence Bailey, DOB July 21, 1940, MRN [id number]  PCP:  Jarold Medici, MD  Cardiologist: Victory LELON Claudene DOUGLAS, MD (Inactive)   Chief Complaint  Patient presents with   Sleep Apnea   Hypertension    History of Present Illness:  Lawrence Bailey is a 83 y.o. male with a history of CAD, PAF, recurrent syncope, hypertension and prostate cancer who is followed by EP for PAF and recurrent syncope.  Due to paroxysmal atrial fibrillation a sleep study was ordered because his episodes occur predominantly at night and his wife was complaining that he was snoring.  He underwent home sleep study showing mild obstructive sleep apnea with an AHI of 9.3/h overall and moderate sleep apnea during REM sleep with REM AHI 17/h.  He was started on auto CPAP from 4 to 15 cm H2O.    When I saw him back in November 2024 his AHI was mildly elevated at 6.4/h and he was having a very high mask leak.  I referred him back to his DME for mask fitting for a nasal pillow mask with a chinstrap.  He did not like it so he went back to the full face mask which is working well for him. He says that it is not leaking. He is now back for follow-up  He is doing well with his PAP device and thinks that he has gotten used to it.  He tolerates the full face mask and feels the pressure is adequate.  Since going on PAP he feels rested in the am but does still take naps if he is sedentary.  He denies any significant mouth or nasal dryness just some mild issues and has some mild nasal congestion at times.  He does not think that he snores.    Past Medical History:  Diagnosis Date   BPH (benign prostatic hyperplasia)    Coronary artery disease    GIST (gastrointestinal stromal tumor), malignant (HCC) dx'd 04/2007   gleevac comp 04/2008   Glaucoma    Hypertension    OSA on CPAP    mild obstructive sleep apnea with an AHI of 9.3/h overall and moderate sleep apnea during REM sleep with  REM AHI 17/h.   Prostate cancer [facility])    Renal insufficiency     Past Surgical History:  Procedure Laterality Date   ABDOMINAL SURGERY     EYE SURGERY     HERNIA REPAIR     KNEE SURGERY      Current Medications: Current Meds  Medication Sig   amLODipine  (NORVASC ) 10 MG tablet Take 10 mg by mouth daily.   apixaban  (ELIQUIS ) 5 MG TABS tablet Take 1 tablet (5 mg total) by mouth 2 (two) times daily.   atorvastatin  (LIPITOR) 10 MG tablet TAKE 1 TABLET BY MOUTH MONDAY - FRIDAY AT BEDTIME (Patient taking differently: Take 10 mg by mouth See admin instructions. Take 1 tablet by mouth Monday - Friday at bedtime)   brimonidine  (ALPHAGAN ) 0.2 % ophthalmic solution 1 drop 2 (two) times daily.   COSOPT  PF 2-0.5 % SOLN Apply 1 drop to eye 2 (two) times daily.   docusate sodium  (COLACE) 100 MG capsule Take 100 mg by mouth daily as needed for mild constipation.   finasteride  (PROSCAR ) 5 MG tablet Take 5 mg by mouth daily.   fluticasone  (FLONASE ) 50 MCG/ACT nasal spray Place 2 sprays into both nostrils See admin instructions. INHALE 2  SPRAYS INTO EACH NOSTRIL EVERY NIGHT   latanoprost  (XALATAN ) 0.005 % ophthalmic solution Place 1 drop into both eyes at bedtime.   polyethylene glycol (MIRALAX  / GLYCOLAX ) 17 g packet Take 17 g by mouth daily. (Patient taking differently: Take 17 g by mouth as needed for moderate constipation.)   sildenafil  (REVATIO ) 20 MG tablet    tamsulosin  (FLOMAX ) 0.4 MG CAPS capsule Take 0.4 mg by mouth daily.   traMADol  (ULTRAM ) 50 MG tablet Take 1 tablet (50 mg total) by mouth every 12 (twelve) hours as needed.   valsartan  (DIOVAN ) 40 MG tablet Take 1 tablet (40 mg total) by mouth daily.    Allergies:   Other and Sucralfate   Social History   Socioeconomic History   Marital status: Married    Spouse name: Not on file   Number of children: Not on file   Years of education: Not on file   Highest education level: 11th grade  Occupational History   Occupation: retired   Tobacco Use   Smoking status: Never   Smokeless tobacco: Never  Vaping Use   Vaping status: Never Used  Substance and Sexual Activity   Alcohol use: Not Currently   Drug use: No   Sexual activity: Yes  Other Topics Concern   Not on file  Social History Narrative   Not on file   Social Drivers of Health   Financial Resource Strain: Low Risk  (12/28/2022)   Overall Financial Resource Strain (CARDIA)    Difficulty of Paying Living Expenses: Not hard at all  Food Insecurity: No Food Insecurity (02/04/2023)   Hunger Vital Sign    Worried About Running Out of Food in the Last Year: Never true    Ran Out of Food in the Last Year: Never true  Transportation Needs: No Transportation Needs (02/04/2023)   PRAPARE - Administrator, Civil Service (Medical): No    Lack of Transportation (Non-Medical): No  Physical Activity: Inactive (12/28/2022)   Exercise Vital Sign    Days of Exercise per Week: 0 days    Minutes of Exercise per Session: 10 min  Stress: No Stress Concern Present (12/28/2022)   [employer] of Occupational Health - Occupational Stress Questionnaire    Feeling of Stress : Not at all  Social Connections: Unknown (12/28/2022)   Social Connection and Isolation Panel [NHANES]    Frequency of Communication with Friends and Family: More than three times a week    Frequency of Social Gatherings with Friends and Family: Patient declined    Attends Religious Services: Patient declined    Database Administrator or Organizations: No    Attends Banker Meetings: Never    Marital Status: Married  Recent Concern: Social Connections - Moderately Isolated (12/10/2022)   Social Connection and Isolation Panel [NHANES]    Frequency of Communication with Friends and Family: Once a week    Frequency of Social Gatherings with Friends and Family: Three times a week    Attends Religious Services: Never    Active Member of Clubs or Organizations: No    Attends Tax Inspector Meetings: Never    Marital Status: Married     Family History:  The patient's family history includes Cancer in his mother; Hypertension in his mother.   ROS:   Please see the history of present illness.    ROS All other systems reviewed and are negative.      No data to display  PHYSICAL EXAM:   VS:  BP 130/70   Pulse 71   Ht 5' 8 (1.727 m)   Wt 180 lb 9.6 oz (81.9 kg)   SpO2 93%   BMI 27.46 kg/m    GEN: Well nourished, well developed in no acute distress HEENT: Normal NECK: No JVD; No carotid bruits LYMPHATICS: No lymphadenopathy CARDIAC:RRR, no murmurs, rubs, gallops RESPIRATORY:  Clear to auscultation without rales, wheezing or rhonchi  ABDOMEN: Soft, non-tender, non-distended MUSCULOSKELETAL:  No edema; No deformity  SKIN: Warm and dry NEUROLOGIC:  Alert and oriented x 3 PSYCHIATRIC:  Normal affect  Wt Readings from Last 3 Encounters:  06/08/23 180 lb 9.6 oz (81.9 kg)  05/03/23 177 lb 9.6 oz (80.6 kg)  04/26/23 176 lb 12.8 oz (80.2 kg)      Studies/Labs Reviewed:   Home sleep study and PAP compliance download  Recent Labs: 12/10/2022: TSH 1.330 02/02/2023: Magnesium  2.2 10-22-202024: ALT 12; BUN 29; Creatinine, Ser 1.30; Hemoglobin 10.7; Platelets 135.0; Potassium 3.8; Sodium 135    ASSESSMENT:    1. OSA (obstructive sleep apnea)   2. Essential hypertension   3. OSA on CPAP       PLAN:  In order of problems listed above:  #OSA - The patient is tolerating PAP therapy well without any problems. The PAP download performed by his DME was personally reviewed and interpreted by me today and showed an AHI of 5.3 /hr on auto CPAP from 4-15 cm H2O with 47 % compliance in using more than 4 hours nightly.  The patient has been using and benefiting from PAP use and will continue to benefit from therapy.  -I encouraged him to more compliant with his device ( he had not used it for a while because he was waiting for new  supplies)  #Hypertension -Blood pressure adequately controlled on exam today -Continue prescription drug management with amlodipine  10 mg daily and valsartan  40 mg daily with urine refills  Followup with me in 1 year  Time Spent: 20 minutes total time of encounter, including 15 minutes spent in face-to-face patient care on the date of this encounter. This time includes coordination of care and counseling regarding above mentioned problem list. Remainder of non-face-to-face time involved reviewing chart documents/testing relevant to the patient encounter and documentation in the medical record. I have independently reviewed documentation from referring provider  Medication Adjustments/Labs and Tests Ordered: Current medicines are reviewed at length with the patient today.  Concerns regarding medicines are outlined above.  Medication changes, Labs and Tests ordered today are listed in the Patient Instructions below.  There are no Patient Instructions on file for this visit.   Signed, Wilbert Bihari, MD  06/08/2023 10:07 AM    [facility] [address] [place], [place], [place]  [address] Phone: [phone]; Fax: [phone]

## 2023-06-13 ENCOUNTER — Other Ambulatory Visit: Payer: Self-pay | Admitting: Internal Medicine

## 2023-06-14 ENCOUNTER — Ambulatory Visit: Payer: No Typology Code available for payment source | Admitting: Internal Medicine

## 2023-06-15 ENCOUNTER — Ambulatory Visit: Payer: No Typology Code available for payment source | Admitting: Internal Medicine

## 2023-06-15 VITALS — BP 122/80 | HR 62 | Temp 98.6°F | Ht 68.0 in | Wt 176.0 lb

## 2023-06-15 DIAGNOSIS — I1310 Hypertensive heart and chronic kidney disease without heart failure, with stage 1 through stage 4 chronic kidney disease, or unspecified chronic kidney disease: Secondary | ICD-10-CM

## 2023-06-15 DIAGNOSIS — R7309 Other abnormal glucose: Secondary | ICD-10-CM | POA: Insufficient documentation

## 2023-06-15 DIAGNOSIS — D696 Thrombocytopenia, unspecified: Secondary | ICD-10-CM

## 2023-06-15 DIAGNOSIS — D472 Monoclonal gammopathy: Secondary | ICD-10-CM | POA: Diagnosis not present

## 2023-06-15 DIAGNOSIS — I700 Atherosclerosis of aorta: Secondary | ICD-10-CM

## 2023-06-15 DIAGNOSIS — N1831 Chronic kidney disease, stage 3a: Secondary | ICD-10-CM | POA: Diagnosis not present

## 2023-06-15 DIAGNOSIS — I480 Paroxysmal atrial fibrillation: Secondary | ICD-10-CM

## 2023-06-15 DIAGNOSIS — G4733 Obstructive sleep apnea (adult) (pediatric): Secondary | ICD-10-CM

## 2023-06-15 DIAGNOSIS — D649 Anemia, unspecified: Secondary | ICD-10-CM

## 2023-06-15 NOTE — Progress Notes (Signed)
I,Jameka J Llittleton, CMA,acting as a Neurosurgeon for [name], MD.,have documented all relevant documentation on the behalf of [name], MD,as directed by  [name], MD while in the presence of [name], MD.  Subjective:  Patient ID: Lawrence Bailey , male    DOB: 1940-07-28 , 83 y.o.   MRN: [id number]  Chief Complaint  Patient presents with  . Hypertension    HPI  He presents today for BP check.  He reports compliance with meds. He denies headaches, chest pain and shortness of breath. Patient doesn't have any questions or concerns at this time.  Hypertension This is a chronic problem. The current episode started more than 1 year ago. The problem has been gradually improving since onset. The problem is controlled. Pertinent negatives include no blurred vision, chest pain or headaches. Risk factors for coronary artery disease include male gender. The current treatment provides moderate improvement. Hypertensive end-organ damage includes kidney disease.     Past Medical History:  Diagnosis Date  . BPH (benign prostatic hyperplasia)   . Coronary artery disease   . GIST (gastrointestinal stromal tumor), malignant (HCC) dx'd 04/2007   gleevac comp 04/2008  . Glaucoma   . Hypertension   . OSA on CPAP    mild obstructive sleep apnea with an AHI of 9.3/h overall and moderate sleep apnea during REM sleep with REM AHI 17/h.  . Prostate cancer (HCC)   . Renal insufficiency      Family History  Problem Relation Age of Onset  . Hypertension Mother   . Cancer Mother   . Breast cancer Neg Hx   . Prostate cancer Neg Hx   . Colon cancer Neg Hx   . Pancreatic cancer Neg Hx      Current Outpatient Medications:  .  amLODipine (NORVASC) 10 MG tablet, Take 10 mg by mouth daily., Disp: , Rfl: 3 .  atorvastatin (LIPITOR) 10 MG tablet, Take 1 tablet (10 mg total) by mouth See admin instructions. Take 1 tablet by mouth Monday - Friday at bedtime, Disp: 90 tablet, Rfl: 1 .   brimonidine (ALPHAGAN) 0.2 % ophthalmic solution, 1 drop 2 (two) times daily., Disp: , Rfl:  .  COSOPT PF 2-0.5 % SOLN, Apply 1 drop to eye 2 (two) times daily., Disp: , Rfl:  .  docusate sodium (COLACE) 100 MG capsule, Take 100 mg by mouth daily as needed for mild constipation., Disp: , Rfl:  .  finasteride (PROSCAR) 5 MG tablet, Take 5 mg by mouth daily., Disp: , Rfl:  .  fluticasone (FLONASE) 50 MCG/ACT nasal spray, Place 2 sprays into both nostrils See admin instructions. INHALE 2 SPRAYS INTO EACH NOSTRIL EVERY NIGHT, Disp: 48 mL, Rfl: 1 .  latanoprost (XALATAN) 0.005 % ophthalmic solution, Place 1 drop into both eyes at bedtime., Disp: , Rfl:  .  polyethylene glycol (MIRALAX / GLYCOLAX) 17 g packet, Take 17 g by mouth daily. (Patient taking differently: Take 17 g by mouth as needed for moderate constipation.), Disp: 14 each, Rfl: 0 .  tamsulosin (FLOMAX) 0.4 MG CAPS capsule, Take 0.4 mg by mouth daily., Disp: , Rfl:  .  traMADol (ULTRAM) 50 MG tablet, Take 1 tablet (50 mg total) by mouth every 12 (twelve) hours as needed., Disp: 20 tablet, Rfl: 0 .  valsartan (DIOVAN) 40 MG tablet, Take 1 tablet (40 mg total) by mouth daily., Disp: 90 tablet, Rfl: 3 .  famotidine (PEPCID) 20 MG tablet, TAKE 1 TABLET BY  MOUTH EVERY DAY, Disp: 90 tablet, Rfl: 1 .  sildenafil (REVATIO) 20 MG tablet, Take 1 tablet (20 mg total) by mouth as needed., Disp: 10 tablet, Rfl: 0 .  warfarin (COUMADIN) 5 MG tablet, TAKE 1 TABLET BY MOUTH DAILY OR AS DIRECTED BY COUMADIN CLINIC, Disp: 90 tablet, Rfl: 0   Allergies  Allergen Reactions  . Other     Bee sting   . Sucralfate Other (See Comments)     Review of Systems  Constitutional: Negative.   Eyes: Negative.  Negative for blurred vision.  Respiratory: Negative.    Cardiovascular: Negative.  Negative for chest pain.  Gastrointestinal: Negative.   Musculoskeletal: Negative.   Skin: Negative.   Neurological:  Negative for headaches.  Psychiatric/Behavioral:  Negative.       Today's Vitals   06/15/23 1523  BP: 122/80  Pulse: 62  Temp: 98.6 F (37 C)  Weight: 176 lb (79.8 kg)  Height: 5\' 8"  (1.727 m)  PainSc: 0-No pain   Body mass index is 26.76 kg/m.  Wt Readings from Last 3 Encounters:  06/15/23 176 lb (79.8 kg)  06/08/23 180 lb 9.6 oz (81.9 kg)  05/03/23 177 lb 9.6 oz (80.6 kg)    The ASCVD Risk score (Arnett DK, et al., 2019) failed to calculate for the following reasons:   The 2019 ASCVD risk score is only valid for ages 43 to 42   Risk score cannot be calculated because patient has a medical history suggesting prior/existing ASCVD  Objective:  Physical Exam Vitals and nursing note reviewed.  Constitutional:      Appearance: Normal appearance.  HENT:     Head: Normocephalic and atraumatic.  Cardiovascular:     Rate and Rhythm: Normal rate and regular rhythm.     Heart sounds: Normal heart sounds.  Pulmonary:     Effort: Pulmonary effort is normal.     Breath sounds: Normal breath sounds.  Skin:    General: Skin is warm.  Neurological:     General: No focal deficit present.     Mental Status: He is alert.  Psychiatric:        Mood and Affect: Mood normal.        Assessment And Plan:  Hypertensive heart and renal disease with renal failure, stage 1 through stage 4 or unspecified chronic kidney disease, without heart failure Assessment & Plan: Chronic, well controlled.  He will continue with amlodipine 10mg  and valsartan 40mg  daily. He is encouraged to follow a low sodium diet. He will f/u in four to six months for re-evaluation.   Orders: -     Lipid panel  Stage 3a chronic kidney disease (HCC) Assessment & Plan: Chronic, he is encouraged to stay well hydrated, avoid NSAIDs and keep BP controlled to prevent progression of CKD.     Atherosclerosis of aorta (HCC) Assessment & Plan: Chronic, LDL goal < 70.  He will continue with atorvastatin 10mg  M-F. He is encouraged to follow heart healthy lifestyle.    Orders: -     Lipid panel  Paroxysmal atrial fibrillation (HCC) Assessment & Plan: Chronic, he is properly anticoagulated and rate controlled. He is encouraged to avoid caffeinated products. He is also encouraged to stay well hydrated.    OSA on CPAP Assessment & Plan: Chronic, he is followed by Dr. Mayford Knife. Her input is appreciated.    Monoclonal gammopathy of unknown significance (MGUS) Assessment & Plan: Chronic, Hem/Onc input appreciated. Per Onc, no evidence of progression to MM. He is  followed yearly w/ SPEP and IgG levels.    Other abnormal glucose Assessment & Plan: Previous labs reviewed, his A1c has been elevated in the past. I will check an A1c today. Reminded to avoid refined sugars including sugary drinks/foods and processed meats including bacon, sausages and deli meats.    Orders: -     Hemoglobin A1c    Return in about 6 months (around 12/13/2023), or bp check/chol check AM appt.  Patient was given opportunity to ask questions. Patient verbalized understanding of the plan and was able to repeat key elements of the plan. All questions were answered to their satisfaction.    I, [name], MD, have reviewed all documentation for this visit. The documentation on 06/15/23 for the exam, diagnosis, procedures, and orders are all accurate and complete.   IF YOU HAVE BEEN REFERRED TO A SPECIALIST, IT MAY TAKE 1-2 WEEKS TO SCHEDULE/PROCESS THE REFERRAL. IF YOU HAVE NOT HEARD FROM US/SPECIALIST IN TWO WEEKS, PLEASE GIVE [place] A CALL AT [phone].

## 2023-06-15 NOTE — Patient Instructions (Signed)
Hypertension, Adult Hypertension is another name for high blood pressure. High blood pressure forces your heart to work harder to pump blood. This can cause problems over time. There are two numbers in a blood pressure reading. There is a top number (systolic) over a bottom number (diastolic). It is best to have a blood pressure that is below 120/80. What are the causes? The cause of this condition is not known. Some other conditions can lead to high blood pressure. What increases the risk? Some lifestyle factors can make you more likely to develop high blood pressure: Smoking. Not getting enough exercise or physical activity. Being overweight. Having too much fat, sugar, calories, or salt (sodium) in your diet. Drinking too much alcohol. Other risk factors include: Having any of these conditions: Heart disease. Diabetes. High cholesterol. Kidney disease. Obstructive sleep apnea. Having a family history of high blood pressure and high cholesterol. Age. The risk increases with age. Stress. What are the signs or symptoms? High blood pressure may not cause symptoms. Very high blood pressure (hypertensive crisis) may cause: Headache. Fast or uneven heartbeats (palpitations). Shortness of breath. Nosebleed. Vomiting or feeling like you may vomit (nauseous). Changes in how you see. Very bad chest pain. Feeling dizzy. Seizures. How is this treated? This condition is treated by making healthy lifestyle changes, such as: Eating healthy foods. Exercising more. Drinking less alcohol. Your doctor may prescribe medicine if lifestyle changes do not help enough and if: Your top number is above 130. Your bottom number is above 80. Your personal target blood pressure may vary. Follow these instructions at home: Eating and drinking  If told, follow the DASH eating plan. To follow this plan: Fill one half of your plate at each meal with fruits and vegetables. Fill one fourth of your plate  at each meal with whole grains. Whole grains include whole-wheat pasta, brown rice, and whole-grain bread. Eat or drink low-fat dairy products, such as skim milk or low-fat yogurt. Fill one fourth of your plate at each meal with low-fat (lean) proteins. Low-fat proteins include fish, chicken without skin, eggs, beans, and tofu. Avoid fatty meat, cured and processed meat, or chicken with skin. Avoid pre-made or processed food. Limit the amount of salt in your diet to less than 1,500 mg each day. Do not drink alcohol if: Your doctor tells you not to drink. You are pregnant, may be pregnant, or are planning to become pregnant. If you drink alcohol: Limit how much you have to: 0-1 drink a day for women. 0-2 drinks a day for men. Know how much alcohol is in your drink. In the [place] one drink equals one 12 oz bottle of beer (355 mL), one 5 oz glass of wine (148 mL), or one 1 oz glass of hard liquor (44 mL). Lifestyle  Work with your doctor to stay at a healthy weight or to lose weight. Ask your doctor what the best weight is for you. Get at least 30 minutes of exercise that causes your heart to beat faster (aerobic exercise) most days of the week. This may include walking, swimming, or biking. Get at least 30 minutes of exercise that strengthens your muscles (resistance exercise) at least 3 days a week. This may include lifting weights or doing Pilates. Do not smoke or use any products that contain nicotine or tobacco. If you need help quitting, ask your doctor. Check your blood pressure at home as told by your doctor. Keep all follow-up visits. Medicines Take over-the-counter and prescription medicines   only as told by your doctor. Follow directions carefully. Do not skip doses of blood pressure medicine. The medicine does not work as well if you skip doses. Skipping doses also puts you at risk for problems. Ask your doctor about side effects or reactions to medicines that you should watch  for. Contact a doctor if: You think you are having a reaction to the medicine you are taking. You have headaches that keep coming back. You feel dizzy. You have swelling in your ankles. You have trouble with your vision. Get help right away if: You get a very bad headache. You start to feel mixed up (confused). You feel weak or numb. You feel faint. You have very bad pain in your: Chest. Belly (abdomen). You vomit more than once. You have trouble breathing. These symptoms may be an emergency. Get help right away. Call 911. Do not wait to see if the symptoms will go away. Do not drive yourself to the hospital. Summary Hypertension is another name for high blood pressure. High blood pressure forces your heart to work harder to pump blood. For most people, a normal blood pressure is less than 120/80. Making healthy choices can help lower blood pressure. If your blood pressure does not get lower with healthy choices, you may need to take medicine. This information is not intended to replace advice given to you by your health care provider. Make sure you discuss any questions you have with your health care provider. Document Revised: 02/27/2021 Document Reviewed: 02/27/2021 Elsevier Patient Education  2024 [employer] Inc.  

## 2023-06-16 LAB — LIPID PANEL
Chol/HDL Ratio: 1.9 {ratio} (ref 0.0–5.0)
Cholesterol, Total: 102 mg/dL (ref 100–199)
HDL: 54 mg/dL (ref >39)
LDL Chol Calc (NIH): 33 mg/dL (ref 0–99)
Triglycerides: 71 mg/dL (ref 0–149)
VLDL Cholesterol Cal: 15 mg/dL (ref 5–40)

## 2023-06-16 LAB — HEMOGLOBIN A1C
Est. average glucose Bld gHb Est-mCnc: 117 mg/dL
Hgb A1c MFr Bld: 5.7 % — ABNORMAL HIGH (ref 4.8–5.6)

## 2023-06-17 ENCOUNTER — Other Ambulatory Visit: Payer: Self-pay | Admitting: Internal Medicine

## 2023-06-17 ENCOUNTER — Other Ambulatory Visit: Payer: Self-pay

## 2023-06-17 DIAGNOSIS — I480 Paroxysmal atrial fibrillation: Secondary | ICD-10-CM

## 2023-06-17 MED ORDER — APIXABAN 5 MG PO TABS: 5.0000 mg | ORAL_TABLET | Freq: Two times a day (BID) | ORAL | 1 refills | Status: DC

## 2023-06-17 NOTE — Telephone Encounter (Signed)
Prescription refill request for Eliquis received. Indication: Afib  Last office visit: 06/08/23 Mayford Knife)  Scr: 1.30 (04/16/23)  Age: 83 Weight: 79.8kg  Appropriate dose. Refill sent.

## 2023-06-21 ENCOUNTER — Telehealth: Payer: Self-pay | Admitting: Cardiology

## 2023-06-21 ENCOUNTER — Other Ambulatory Visit: Payer: Self-pay

## 2023-06-21 DIAGNOSIS — I480 Paroxysmal atrial fibrillation: Secondary | ICD-10-CM

## 2023-06-21 DIAGNOSIS — D6869 Other thrombophilia: Secondary | ICD-10-CM

## 2023-06-21 MED ORDER — SILDENAFIL CITRATE 20 MG PO TABS: 20.0000 mg | ORAL_TABLET | ORAL | 0 refills | Status: AC | PRN

## 2023-06-21 NOTE — Telephone Encounter (Signed)
*  STAT* If patient is at the pharmacy, call can be transferred to refill team.   1. Which medications need to be refilled? (please list name of each medication and dose if known) atorvastatin (LIPITOR) 10 MG tablet   sildenafil (REVATIO) 20 MG tablet  amLODipine (NORVASC) 10 MG tablet  2. Which pharmacy/location (including street and city if local pharmacy) is medication to be sent to?  [id number] - [place], [place] - [address] [phone]  3. Do they need a 30 day or 90 day supply? 90

## 2023-06-21 NOTE — Telephone Encounter (Signed)
Call to patient to discuss eliquis affordability questions. Patient and spouse [place] Risk analyst) report that patient was on patient assistance last year for this medication, with the changes to medicare deductible they are unsure if they would qualify this year. Forwarded to Patient assistance for guidance. Patient reports he only has 3 days worth of medication left.

## 2023-06-21 NOTE — Telephone Encounter (Signed)
Pt c/o medication issue:  1. Name of Medication: apixaban (ELIQUIS) 5 MG TABS tablet   2. How are you currently taking this medication (dosage and times per day)? 5 mg, 2 times daily   3. Are you having a reaction (difficulty breathing--STAT)? No  4. What is your medication issue? Cost/too expensive

## 2023-06-21 NOTE — Telephone Encounter (Signed)
Pt is requesting a refill on medications atorvastatin, amlodipine and sildenafil. Dr. Mayford Knife sees pt for sleep and did not prescribe these medications. Would Dr. Mayford Knife like to refill these medications? Please address

## 2023-06-22 ENCOUNTER — Other Ambulatory Visit (HOSPITAL_COMMUNITY): Payer: Self-pay

## 2023-06-22 NOTE — Telephone Encounter (Signed)
Based on test claims, patient has a high deductible Medicare plan. Unfortunately, all the PAP options for DOAC's require patient to meet a 3-4% out of pocket spending on drugs for 2025 based on their annual income. Patient's likely won't meet any PAP requirements until later on in the year once they have paid some towards their deductible. Patient would likely benefit from setting up a Medicare payment plan with their insurance. Patient can contact the customer service number on the back of their insurance card and request to setup a payment plan where the deductible would be split up into payments over time. I hope this makes sense. Please reach back out if you have any additional questions or concerns.

## 2023-06-22 NOTE — Telephone Encounter (Signed)
Call to patient and spouse to advise of patient assistance options for eliquis. Cala Bradford (Spouse-DPR) states a payment plan will not work for them and they would like to be switched to a cheaper medication. She states they are open to coumadin and requests Dr. Mayford Knife to let them know if they can look at changing to this medication. She states he has 2-3 days worth of medication left.

## 2023-06-22 NOTE — Telephone Encounter (Signed)
Called pt and spoke with pt's wife informing her that they needed to contact pt's PCP to inquire about refilling these medications. I advised the pt's wife that if they have any other cardiac problems, questions or concerns, to give our office a call back. Wife verbalized understanding.

## 2023-06-23 DIAGNOSIS — G4733 Obstructive sleep apnea (adult) (pediatric): Secondary | ICD-10-CM | POA: Diagnosis not present

## 2023-06-23 DIAGNOSIS — D6869 Other thrombophilia: Secondary | ICD-10-CM | POA: Insufficient documentation

## 2023-06-23 MED ORDER — WARFARIN SODIUM 5 MG PO TABS: ORAL_TABLET | ORAL | 0 refills | Status: DC

## 2023-06-23 NOTE — Telephone Encounter (Signed)
Referral placed to Coumadin clinic.

## 2023-06-23 NOTE — Telephone Encounter (Signed)
Called and spoke with pt and pt's wife, Cala Bradford. I edcated them on transitioning to Warfarin. A full discussion of the nature of anticoagulants was been carried out.  A benefit risk analysis was presented to the patient, so that they understand the justification for choosing this anticoagulation at this time. The need for frequent and regular monitoring, precise dosage adjustment and compliance is stressed. Side effects of potential bleeding are discussed.  The patient should avoid any OTC items containing aspirin or ibuprofen, and should avoid great swings in general diet.  Avoid alcohol consumption.  Call if any signs of abnormal bleeding. Pt's wife states pt only has 2 tablets of Eliquis left. Pt will start Warfarin 5mg  daily tonight and continue Eliquis today and tomorrow. Scheduled coumadin clinic appt for 07/01/23 at 10:30am.

## 2023-06-23 NOTE — Addendum Note (Signed)
Addended by: Betsy Coder B on: 06/23/2023 09:15 AM   Modules accepted: Orders

## 2023-06-23 NOTE — Addendum Note (Signed)
Addended by: Cheree Ditto on: 06/23/2023 08:03 AM   Modules accepted: Orders

## 2023-06-24 ENCOUNTER — Other Ambulatory Visit (HOSPITAL_COMMUNITY): Payer: Self-pay

## 2023-06-24 NOTE — Assessment & Plan Note (Signed)
Chronic, well controlled.  He will continue with amlodipine 10mg  and valsartan 40mg  daily. He is encouraged to follow a low sodium diet. He will f/u in four to six months for re-evaluation.

## 2023-06-24 NOTE — Assessment & Plan Note (Signed)
Chronic, he is properly anticoagulated and rate controlled. He is encouraged to avoid caffeinated products. He is also encouraged to stay well hydrated.

## 2023-06-24 NOTE — Assessment & Plan Note (Signed)
Chronic, Hem/Onc input appreciated. Per Onc, no evidence of progression to MM. He is followed yearly w/ SPEP and IgG levels.

## 2023-06-24 NOTE — Assessment & Plan Note (Signed)
Previous labs reviewed, his A1c has been elevated in the past. I will check an A1c today. Reminded to avoid refined sugars including sugary drinks/foods and processed meats including bacon, sausages and deli meats.

## 2023-06-24 NOTE — Assessment & Plan Note (Addendum)
Chronic, he is followed by Dr. Mayford Knife. Her input is appreciated. He is encouraged to use PAP at least four hours per night.

## 2023-06-24 NOTE — Assessment & Plan Note (Signed)
Chronic, he is encouraged to stay well hydrated, avoid NSAIDs and keep BP controlled to prevent progression of CKD.

## 2023-06-24 NOTE — Assessment & Plan Note (Signed)
Chronic, LDL goal < 70.  He will continue with atorvastatin 10mg  M-F. He is encouraged to follow heart healthy lifestyle.

## 2023-06-30 ENCOUNTER — Other Ambulatory Visit: Payer: No Typology Code available for payment source

## 2023-06-30 ENCOUNTER — Inpatient Hospital Stay: Payer: No Typology Code available for payment source | Attending: Oncology | Admitting: Oncology

## 2023-06-30 ENCOUNTER — Inpatient Hospital Stay: Payer: No Typology Code available for payment source

## 2023-06-30 VITALS — BP 138/85 | HR 64 | Temp 98.1°F | Resp 18 | Ht 68.0 in | Wt 178.0 lb

## 2023-06-30 DIAGNOSIS — Z85028 Personal history of other malignant neoplasm of stomach: Secondary | ICD-10-CM | POA: Insufficient documentation

## 2023-06-30 DIAGNOSIS — D696 Thrombocytopenia, unspecified: Secondary | ICD-10-CM | POA: Diagnosis not present

## 2023-06-30 DIAGNOSIS — D472 Monoclonal gammopathy: Secondary | ICD-10-CM

## 2023-06-30 DIAGNOSIS — D649 Anemia, unspecified: Secondary | ICD-10-CM | POA: Diagnosis not present

## 2023-06-30 NOTE — Progress Notes (Signed)
  [facility] OFFICE PROGRESS NOTE   Diagnosis: Gastrointestinal stromal tumor, anemia  INTERVAL HISTORY:  [name] returns as scheduled.  He generally feels well.  Good appetite.  He has intermittent mid and lower abdominal pain.  No consistent pain.  No difficulty with bowel function.  No bleeding.  He was seen in the emergency room in November with an upper respiratory infection.  Objective:  Vital signs in last 24 hours:  Blood pressure 138/85, pulse 64, temperature 98.1 F (36.7 C), temperature source Temporal, resp. rate 18, height 5' 8 (1.727 m), weight 178 lb (80.7 kg), SpO2 98%.  Lymphatics: No cervical, supraclavicular, axillary, or inguinal nodes Resp: Scattered coarse end inspiratory rhonchi at the posterior chest bilaterally, no respiratory distress Cardio: Regular rate and rhythm GI: Nontender, no mass, no hepatosplenomegaly Vascular: No leg edema   Lab Results:  Lab Results  Component Value Date   WBC 4.5 06/08/2023   HGB 11.0 (L) 06/08/2023   HCT 34.2 (L) 06/08/2023   MCV 83.5 06/08/2023   PLT 138.0 (L) 06/08/2023   NEUTROABS 3.2 06/08/2023    CMP  Lab Results  Component Value Date   NA 135 10-29-202024   K 3.8 10-29-202024   CL 102 10-29-202024   CO2 31 10-29-202024   GLUCOSE 110 (H) 10-29-202024   BUN 29 (H) 10-29-202024   CREATININE 1.30 10-29-202024   CALCIUM  9.2 10-29-202024   PROT 8.2 10-29-202024   ALBUMIN 3.5 10-29-202024   AST 18 10-29-202024   ALT 12 10-29-202024   ALKPHOS 48 10-29-202024   BILITOT 0.4 10-29-202024   GFRNONAA 57 (L) 02/24/2023   GFRAA 62 07/09/2020     Medications: I have reviewed the patient's current medications.   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 Pathology showed a 6 cm GI stromal tumor. No vascular or lymphatic invasion. All surgical margins negative. Lymph nodes not sampled. Tumor confined to the submucosa. Mitotic activity 25-50 mitoses  per high-power field. He completed 1 year of Gleevec 400 mg daily. Restaging CT abdomen/pelvis 04/10/2014 with no evidence of recurrent/metastatic disease.  CT abdomen/pelvis 05/12/2018 without evidence of local recurrence or metastatic disease. History of right lower extremity DVT June 2018. Evaluation in the emergency department 03/08/2018 for right leg redness, swelling and tenderness.  Venous Doppler negative for DVT.  Incidental finding of a possible 3.3 cm right inguinal lymph node.  CT abdomen/pelvis 05/12/2018 showed stable small inguinal lymph nodes bilaterally. Chronic renal failure Monoclonal gammopathy of unknown significance, IgG lambda 01/09/2019 SPEP with M spike 1.5  07/05/2019 SPEP with M spike 1.9 07/30/2020 SPEP with M spike 2.2 01/30/2022 SPEP with M spike 2.4 07/07/2022 SPEP with M spike 2.4 Hypertension Prostate cancer-on surveillance, followed by urology Paroxysmal Atrial Fibrillation      Disposition:  [name] appears stable.  He is in clinical remission gastrointestinal stromal tumor.  He has a serum monoclonal myopathy of unknown significance.  Will follow-up on the myeloma panel from today.  He has chronic mild anemia and thrombocytopenia.  The hemoglobin was last month.  There is no clinical evidence for progression to myeloma.  He will return for an office and lab visit in 6 months.  He will call for increased or consistent abdominal pain.  Arley Hof, MD  06/30/2023  12:17 PM

## 2023-07-01 ENCOUNTER — Ambulatory Visit: Payer: No Typology Code available for payment source | Attending: Cardiovascular Disease

## 2023-07-01 DIAGNOSIS — I480 Paroxysmal atrial fibrillation: Secondary | ICD-10-CM

## 2023-07-01 LAB — POCT INR: INR: 1.8 — AB (ref 2.0–3.0)

## 2023-07-01 NOTE — Patient Instructions (Addendum)
 Description   Continue taking 5mg  daily  Remain consistent with greens each week (5 serving per week).  [facility] [phone]    A full discussion of the nature of anticoagulants has been carried out.  A benefit risk analysis has been presented to the patient, so that they understand the justification for choosing anticoagulation at this time. The need for frequent and regular monitoring, precise dosage adjustment and compliance is stressed.  Side effects of potential bleeding are discussed.  The patient should avoid any OTC items containing aspirin  or ibuprofen, and should avoid great swings in general diet.  Avoid alcohol consumption.  Call if any signs of abnormal bleeding.

## 2023-07-02 LAB — KAPPA/LAMBDA LIGHT CHAINS
Kappa free light chain: 31 mg/L — ABNORMAL HIGH (ref 3.3–19.4)
Kappa, lambda light chain ratio: 1.4 (ref 0.26–1.65)
Lambda free light chains: 22.2 mg/L (ref 5.7–26.3)

## 2023-07-03 LAB — IGG: IgG (Immunoglobin G), Serum: 4380 mg/dL — ABNORMAL HIGH (ref 603–1613)

## 2023-07-06 ENCOUNTER — Other Ambulatory Visit (HOSPITAL_COMMUNITY): Payer: Self-pay

## 2023-07-06 LAB — PROTEIN ELECTROPHORESIS, SERUM
A/G Ratio: 0.8 (ref 0.7–1.7)
Albumin ELP: 3.7 g/dL (ref 2.9–4.4)
Alpha-1-Globulin: 0.2 g/dL (ref 0.0–0.4)
Alpha-2-Globulin: 0.6 g/dL (ref 0.4–1.0)
Beta Globulin: 0.8 g/dL (ref 0.7–1.3)
Gamma Globulin: 3.2 g/dL — ABNORMAL HIGH (ref 0.4–1.8)
Globulin, Total: 4.8 g/dL — ABNORMAL HIGH (ref 2.2–3.9)
M-Spike, %: 2.5 g/dL — ABNORMAL HIGH
Total Protein ELP: 8.5 g/dL (ref 6.0–8.5)

## 2023-07-08 ENCOUNTER — Ambulatory Visit: Payer: No Typology Code available for payment source | Attending: Cardiology

## 2023-07-08 ENCOUNTER — Telehealth: Payer: Self-pay

## 2023-07-08 DIAGNOSIS — D6869 Other thrombophilia: Secondary | ICD-10-CM | POA: Diagnosis not present

## 2023-07-08 DIAGNOSIS — I480 Paroxysmal atrial fibrillation: Secondary | ICD-10-CM

## 2023-07-08 LAB — POCT INR: INR: 1.8 — AB (ref 2.0–3.0)

## 2023-07-08 NOTE — Telephone Encounter (Signed)
Patient gave verbal understanding and had no further questions or concerns

## 2023-07-08 NOTE — Patient Instructions (Signed)
Description   Start taking 5mg  (1 tablet) daily except 7.5mg  (1.5 tablets) on Thursdays.  Remain consistent with greens each week (5 serving per week).  Recheck in 1 week.  [facility] Clinic [phone]

## 2023-07-08 NOTE — Telephone Encounter (Signed)
-----   Message from Thornton Papas sent at 07/07/2023  5:32 PM EST ----- Please call patient, the abnormal protein level is stable, follow-up as scheduled

## 2023-07-16 ENCOUNTER — Ambulatory Visit: Payer: No Typology Code available for payment source | Attending: Cardiology

## 2023-07-16 DIAGNOSIS — I480 Paroxysmal atrial fibrillation: Secondary | ICD-10-CM | POA: Diagnosis not present

## 2023-07-16 LAB — POCT INR: INR: 1.7 — AB (ref 2.0–3.0)

## 2023-07-16 NOTE — Patient Instructions (Signed)
Description   Take 2 tablets today and then START taking 5mg  (1 tablet) daily except 10mg  (2 tablets) on Sundays and Thursdays.  Remain consistent with greens each week (4 serving per week).  Recheck in 1 week.  [facility] [phone]

## 2023-07-23 ENCOUNTER — Ambulatory Visit: Payer: No Typology Code available for payment source | Attending: Internal Medicine

## 2023-07-23 DIAGNOSIS — D6869 Other thrombophilia: Secondary | ICD-10-CM

## 2023-07-23 DIAGNOSIS — Z5181 Encounter for therapeutic drug level monitoring: Secondary | ICD-10-CM

## 2023-07-23 DIAGNOSIS — I480 Paroxysmal atrial fibrillation: Secondary | ICD-10-CM

## 2023-07-23 DIAGNOSIS — G4733 Obstructive sleep apnea (adult) (pediatric): Secondary | ICD-10-CM | POA: Diagnosis not present

## 2023-07-23 LAB — POCT INR: INR: 1.7 — AB (ref 2.0–3.0)

## 2023-07-23 NOTE — Patient Instructions (Signed)
 START taking 5mg  (1 tablet) daily except 10mg  (2 tablets) on Mondays, Wednesdays and Fridays  Remain consistent with greens each week (4 serving per week).  Recheck in 1 week.  [facility] [phone]

## 2023-07-30 ENCOUNTER — Ambulatory Visit: Payer: No Typology Code available for payment source | Attending: Cardiology

## 2023-07-30 DIAGNOSIS — I480 Paroxysmal atrial fibrillation: Secondary | ICD-10-CM

## 2023-07-30 LAB — POCT INR: INR: 2.1 (ref 2.0–3.0)

## 2023-07-30 NOTE — Patient Instructions (Signed)
 Description   Take 2.5 tablets today and then resume taking 5mg  (1 tablet) daily except 10mg  (2 tablets) on Mondays, Wednesdays and Fridays  Remain consistent with greens each week (2 serving per week).  Recheck in 1 week.  [facility] [phone]

## 2023-08-06 ENCOUNTER — Ambulatory Visit: Attending: Cardiology

## 2023-08-06 DIAGNOSIS — Z5181 Encounter for therapeutic drug level monitoring: Secondary | ICD-10-CM

## 2023-08-06 DIAGNOSIS — D6869 Other thrombophilia: Secondary | ICD-10-CM | POA: Diagnosis not present

## 2023-08-06 DIAGNOSIS — I480 Paroxysmal atrial fibrillation: Secondary | ICD-10-CM

## 2023-08-06 LAB — POCT INR: INR: 2.9 (ref 2.0–3.0)

## 2023-08-06 NOTE — Patient Instructions (Signed)
 Continue taking 5mg  (1 tablet) daily except 10mg  (2 tablets) on Mondays, Wednesdays and Fridays  Remain consistent with greens each week (2 serving per week).  Recheck in 3 weeks.  [facility] [phone]

## 2023-08-11 DIAGNOSIS — H401122 Primary open-angle glaucoma, left eye, moderate stage: Secondary | ICD-10-CM | POA: Diagnosis not present

## 2023-08-11 DIAGNOSIS — H2511 Age-related nuclear cataract, right eye: Secondary | ICD-10-CM | POA: Diagnosis not present

## 2023-08-11 DIAGNOSIS — H04123 Dry eye syndrome of bilateral lacrimal glands: Secondary | ICD-10-CM | POA: Diagnosis not present

## 2023-08-11 DIAGNOSIS — H401113 Primary open-angle glaucoma, right eye, severe stage: Secondary | ICD-10-CM | POA: Diagnosis not present

## 2023-08-11 DIAGNOSIS — R519 Headache, unspecified: Secondary | ICD-10-CM | POA: Diagnosis not present

## 2023-08-11 DIAGNOSIS — Z961 Presence of intraocular lens: Secondary | ICD-10-CM | POA: Diagnosis not present

## 2023-08-21 DIAGNOSIS — G4733 Obstructive sleep apnea (adult) (pediatric): Secondary | ICD-10-CM | POA: Diagnosis not present

## 2023-08-23 DIAGNOSIS — M1712 Unilateral primary osteoarthritis, left knee: Secondary | ICD-10-CM | POA: Diagnosis not present

## 2023-08-23 DIAGNOSIS — M1711 Unilateral primary osteoarthritis, right knee: Secondary | ICD-10-CM | POA: Diagnosis not present

## 2023-08-23 DIAGNOSIS — M170 Bilateral primary osteoarthritis of knee: Secondary | ICD-10-CM | POA: Diagnosis not present

## 2023-08-27 ENCOUNTER — Other Ambulatory Visit: Payer: Self-pay | Admitting: Cardiology

## 2023-08-27 ENCOUNTER — Ambulatory Visit

## 2023-08-27 DIAGNOSIS — I480 Paroxysmal atrial fibrillation: Secondary | ICD-10-CM

## 2023-08-31 ENCOUNTER — Ambulatory Visit: Attending: Cardiology

## 2023-08-31 DIAGNOSIS — Z5181 Encounter for therapeutic drug level monitoring: Secondary | ICD-10-CM | POA: Diagnosis not present

## 2023-08-31 DIAGNOSIS — I480 Paroxysmal atrial fibrillation: Secondary | ICD-10-CM | POA: Diagnosis not present

## 2023-08-31 DIAGNOSIS — D6869 Other thrombophilia: Secondary | ICD-10-CM | POA: Diagnosis not present

## 2023-08-31 LAB — POCT INR: INR: 1.9 — AB (ref 2.0–3.0)

## 2023-08-31 MED ORDER — WARFARIN SODIUM 5 MG PO TABS: ORAL_TABLET | ORAL | 1 refills | Status: DC

## 2023-08-31 NOTE — Patient Instructions (Signed)
 Take 1.5 tablets today only then Continue taking 5mg  (1 tablet) daily except 10mg  (2 tablets) on Mondays, Wednesdays and Fridays  Remain consistent with greens each week (2 serving per week).  Recheck in 3 weeks.  [facility] [phone]

## 2023-09-03 ENCOUNTER — Encounter: Payer: Self-pay | Admitting: Internal Medicine

## 2023-09-21 ENCOUNTER — Ambulatory Visit: Attending: Internal Medicine

## 2023-09-21 DIAGNOSIS — G4733 Obstructive sleep apnea (adult) (pediatric): Secondary | ICD-10-CM | POA: Diagnosis not present

## 2023-09-21 DIAGNOSIS — D6869 Other thrombophilia: Secondary | ICD-10-CM | POA: Diagnosis not present

## 2023-09-21 DIAGNOSIS — I480 Paroxysmal atrial fibrillation: Secondary | ICD-10-CM

## 2023-09-21 DIAGNOSIS — Z5181 Encounter for therapeutic drug level monitoring: Secondary | ICD-10-CM | POA: Diagnosis not present

## 2023-09-21 LAB — POCT INR: INR: 2.2 (ref 2.0–3.0)

## 2023-09-21 NOTE — Patient Instructions (Signed)
 Continue taking 5mg  (1 tablet) daily except 10mg  (2 tablets) on Mondays, Wednesdays and Fridays  Remain consistent with greens each week (2 serving per week).  Recheck in 6 weeks.  [facility]  Cl[facility] [phone]

## 2023-10-04 ENCOUNTER — Encounter: Payer: Self-pay | Admitting: Cardiology

## 2023-10-04 DIAGNOSIS — I10 Essential (primary) hypertension: Secondary | ICD-10-CM

## 2023-10-04 DIAGNOSIS — G4733 Obstructive sleep apnea (adult) (pediatric): Secondary | ICD-10-CM

## 2023-10-21 DIAGNOSIS — N401 Enlarged prostate with lower urinary tract symptoms: Secondary | ICD-10-CM | POA: Diagnosis not present

## 2023-10-21 DIAGNOSIS — N138 Other obstructive and reflux uropathy: Secondary | ICD-10-CM | POA: Diagnosis not present

## 2023-10-21 DIAGNOSIS — N529 Male erectile dysfunction, unspecified: Secondary | ICD-10-CM | POA: Diagnosis not present

## 2023-10-21 DIAGNOSIS — G4733 Obstructive sleep apnea (adult) (pediatric): Secondary | ICD-10-CM | POA: Diagnosis not present

## 2023-10-23 ENCOUNTER — Other Ambulatory Visit: Payer: Self-pay

## 2023-10-23 ENCOUNTER — Emergency Department (HOSPITAL_COMMUNITY)
Admission: EM | Admit: 2023-10-23 | Discharge: 2023-10-23 | Disposition: A | Discharge status: Home / Self Care | Attending: Emergency Medicine | Admitting: Emergency Medicine

## 2023-10-23 ENCOUNTER — Emergency Department (HOSPITAL_COMMUNITY)

## 2023-10-23 DIAGNOSIS — Z7901 Long term (current) use of anticoagulants: Secondary | ICD-10-CM | POA: Insufficient documentation

## 2023-10-23 DIAGNOSIS — R590 Localized enlarged lymph nodes: Secondary | ICD-10-CM | POA: Insufficient documentation

## 2023-10-23 DIAGNOSIS — I3139 Other pericardial effusion (noninflammatory): Secondary | ICD-10-CM | POA: Insufficient documentation

## 2023-10-23 DIAGNOSIS — M545 Low back pain, unspecified: Secondary | ICD-10-CM | POA: Diagnosis not present

## 2023-10-23 DIAGNOSIS — Z79899 Other long term (current) drug therapy: Secondary | ICD-10-CM | POA: Insufficient documentation

## 2023-10-23 DIAGNOSIS — S0990XA Unspecified injury of head, initial encounter: Secondary | ICD-10-CM | POA: Diagnosis not present

## 2023-10-23 DIAGNOSIS — R519 Headache, unspecified: Secondary | ICD-10-CM | POA: Diagnosis not present

## 2023-10-23 DIAGNOSIS — N200 Calculus of kidney: Secondary | ICD-10-CM | POA: Diagnosis not present

## 2023-10-23 DIAGNOSIS — Y92410 Unspecified street and highway as the place of occurrence of the external cause: Secondary | ICD-10-CM | POA: Diagnosis not present

## 2023-10-23 DIAGNOSIS — S299XXA Unspecified injury of thorax, initial encounter: Secondary | ICD-10-CM | POA: Diagnosis not present

## 2023-10-23 DIAGNOSIS — S3991XA Unspecified injury of abdomen, initial encounter: Secondary | ICD-10-CM | POA: Diagnosis not present

## 2023-10-23 DIAGNOSIS — R9082 White matter disease, unspecified: Secondary | ICD-10-CM | POA: Diagnosis not present

## 2023-10-23 DIAGNOSIS — S3993XA Unspecified injury of pelvis, initial encounter: Secondary | ICD-10-CM | POA: Diagnosis not present

## 2023-10-23 DIAGNOSIS — I700 Atherosclerosis of aorta: Secondary | ICD-10-CM | POA: Diagnosis not present

## 2023-10-23 DIAGNOSIS — S199XXA Unspecified injury of neck, initial encounter: Secondary | ICD-10-CM | POA: Diagnosis not present

## 2023-10-23 DIAGNOSIS — I517 Cardiomegaly: Secondary | ICD-10-CM | POA: Diagnosis not present

## 2023-10-23 DIAGNOSIS — M47817 Spondylosis without myelopathy or radiculopathy, lumbosacral region: Secondary | ICD-10-CM | POA: Diagnosis not present

## 2023-10-23 LAB — COMPREHENSIVE METABOLIC PANEL WITH GFR
ALT: 13 U/L (ref 0–44)
AST: 21 U/L (ref 15–41)
Albumin: 3.5 g/dL (ref 3.5–5.0)
Alkaline Phosphatase: 46 U/L (ref 38–126)
Anion gap: 7 (ref 5–15)
BUN: 19 mg/dL (ref 8–23)
CO2: 24 mmol/L (ref 22–32)
Calcium: 9.4 mg/dL (ref 8.9–10.3)
Chloride: 105 mmol/L (ref 98–111)
Creatinine, Ser: 1.11 mg/dL (ref 0.61–1.24)
GFR, Estimated: >60 mL/min (ref >60)
Glucose, Bld: 101 mg/dL — ABNORMAL HIGH (ref 70–99)
Potassium: 4.6 mmol/L (ref 3.5–5.1)
Sodium: 136 mmol/L (ref 135–145)
Total Bilirubin: 0.7 mg/dL (ref 0.0–1.2)
Total Protein: 8.3 g/dL — ABNORMAL HIGH (ref 6.5–8.1)

## 2023-10-23 LAB — SAMPLE TO BLOOD BANK

## 2023-10-23 LAB — PROTIME-INR
INR: 2.2 — ABNORMAL HIGH (ref 0.8–1.2)
Prothrombin Time: 24.2 s — ABNORMAL HIGH (ref 11.4–15.2)

## 2023-10-23 LAB — URINALYSIS, ROUTINE W REFLEX MICROSCOPIC
Bilirubin Urine: NEGATIVE
Glucose, UA: NEGATIVE mg/dL
Hgb urine dipstick: NEGATIVE
Ketones, ur: NEGATIVE mg/dL
Leukocytes,Ua: NEGATIVE
Nitrite: NEGATIVE
Protein, ur: 30 mg/dL — AB
Specific Gravity, Urine: 1.009 (ref 1.005–1.030)
pH: 7 (ref 5.0–8.0)

## 2023-10-23 LAB — I-STAT CHEM 8, ED
BUN: 21 mg/dL (ref 8–23)
Calcium, Ion: 1.2 mmol/L (ref 1.15–1.40)
Chloride: 105 mmol/L (ref 98–111)
Creatinine, Ser: 1.2 mg/dL (ref 0.61–1.24)
Glucose, Bld: 98 mg/dL (ref 70–99)
HCT: 38 % — ABNORMAL LOW (ref 39.0–52.0)
Hemoglobin: 12.9 g/dL — ABNORMAL LOW (ref 13.0–17.0)
Potassium: 4.6 mmol/L (ref 3.5–5.1)
Sodium: 140 mmol/L (ref 135–145)
TCO2: 24 mmol/L (ref 22–32)

## 2023-10-23 LAB — CBC WITH DIFFERENTIAL/PLATELET
Abs Immature Granulocytes: 0.01 10*3/uL (ref 0.00–0.07)
Basophils Absolute: 0 10*3/uL (ref 0.0–0.1)
Basophils Relative: 0 %
Eosinophils Absolute: 0.1 10*3/uL (ref 0.0–0.5)
Eosinophils Relative: 1 %
HCT: 38 % — ABNORMAL LOW (ref 39.0–52.0)
Hemoglobin: 11.5 g/dL — ABNORMAL LOW (ref 13.0–17.0)
Immature Granulocytes: 0 %
Lymphocytes Relative: 17 %
Lymphs Abs: 1.2 10*3/uL (ref 0.7–4.0)
MCH: 26.5 pg (ref 26.0–34.0)
MCHC: 30.3 g/dL (ref 30.0–36.0)
MCV: 87.6 fL (ref 80.0–100.0)
Monocytes Absolute: 0.5 10*3/uL (ref 0.1–1.0)
Monocytes Relative: 7 %
Neutro Abs: 5.1 10*3/uL (ref 1.7–7.7)
Neutrophils Relative %: 75 %
Platelets: 140 10*3/uL — ABNORMAL LOW (ref 150–400)
RBC: 4.34 MIL/uL (ref 4.22–5.81)
RDW: 15.3 % (ref 11.5–15.5)
WBC: 6.9 10*3/uL (ref 4.0–10.5)
nRBC: 0 % (ref 0.0–0.2)

## 2023-10-23 LAB — ETHANOL: Alcohol, Ethyl (B): <15 mg/dL (ref <15)

## 2023-10-23 MED ORDER — IOHEXOL 350 MG/ML SOLN
75.0000 mL | Freq: Once | INTRAVENOUS | Status: AC | PRN
  Administered 2023-10-23: 75 mL via INTRAVENOUS

## 2023-10-23 NOTE — ED Notes (Signed)
 Patient taken to room 22 to visit wife.
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
## 2023-10-23 NOTE — Discharge Instructions (Addendum)
 You have a mild to large amount of fluid in the lining around your heart.  This has the possibility of constricting your heart pumping function.  If that were to happen has the possibility of causing severe chest pain, trouble breathing, low blood pressure and/or death.  It was recommended that you stay in the hospital for an official echocardiogram.  It is very important to follow-up closely with your cardiologist.  If you develop dizziness, trouble breathing, chest pain, or any other new/concerning symptoms then return to the ER or call 911.
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
## 2023-10-24 ENCOUNTER — Other Ambulatory Visit: Payer: Self-pay | Admitting: Cardiology

## 2023-10-24 ENCOUNTER — Encounter: Payer: Self-pay | Admitting: Cardiology

## 2023-10-24 DIAGNOSIS — I3139 Other pericardial effusion (noninflammatory): Secondary | ICD-10-CM

## 2023-10-24 NOTE — Progress Notes (Signed)
 Outpatient echo ordered for follow up of pericardial effusion (see brief note from overnight cardiology fellow, Dr. Jolan Natal)

## 2023-10-26 ENCOUNTER — Other Ambulatory Visit: Payer: Self-pay | Admitting: Student

## 2023-10-26 ENCOUNTER — Ambulatory Visit: Admitting: Student

## 2023-10-26 ENCOUNTER — Encounter: Payer: Self-pay | Admitting: Student

## 2023-10-26 ENCOUNTER — Telehealth (HOSPITAL_COMMUNITY): Payer: Self-pay | Admitting: *Deleted

## 2023-10-26 ENCOUNTER — Ambulatory Visit: Attending: Internal Medicine

## 2023-10-26 VITALS — BP 158/80 | HR 54 | Ht 68.0 in | Wt 178.6 lb

## 2023-10-26 DIAGNOSIS — I3139 Other pericardial effusion (noninflammatory): Secondary | ICD-10-CM

## 2023-10-26 DIAGNOSIS — I429 Cardiomyopathy, unspecified: Secondary | ICD-10-CM

## 2023-10-26 DIAGNOSIS — G4733 Obstructive sleep apnea (adult) (pediatric): Secondary | ICD-10-CM

## 2023-10-26 DIAGNOSIS — I5020 Unspecified systolic (congestive) heart failure: Secondary | ICD-10-CM

## 2023-10-26 DIAGNOSIS — I480 Paroxysmal atrial fibrillation: Secondary | ICD-10-CM | POA: Diagnosis not present

## 2023-10-26 DIAGNOSIS — Z79899 Other long term (current) drug therapy: Secondary | ICD-10-CM

## 2023-10-26 DIAGNOSIS — I10 Essential (primary) hypertension: Secondary | ICD-10-CM

## 2023-10-26 LAB — ECHOCARDIOGRAM COMPLETE
AR max vel: 2.42 cm2
AV Area VTI: 2.36 cm2
AV Area mean vel: 2.33 cm2
AV Mean grad: 4 mmHg
AV Peak grad: 8.3 mmHg
Ao pk vel: 1.44 m/s
Area-P 1/2: 5.13 cm2
S' Lateral: 4.39 cm

## 2023-10-26 MED ORDER — FUROSEMIDE 20 MG PO TABS: 20.0000 mg | ORAL_TABLET | Freq: Every day | ORAL | 3 refills | Status: AC

## 2023-10-26 MED ORDER — SPIRONOLACTONE 25 MG PO TABS: 12.5000 mg | ORAL_TABLET | Freq: Every day | ORAL | 3 refills | Status: AC
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
## 2023-10-26 NOTE — Patient Instructions (Signed)
 Medication Instructions:  Your physician recommends the following medication changes.   START TAKING: Lasix 20 mg once daily Spironolactone  12.5 mg once daily  *If you need a refill on your cardiac medications before your next appointment, please call your pharmacy*  Lab Work: Your provider would like for you to return in One Week to have the following labs drawn: BMet.   Please go to [facility] [address] ([facility] [address] [facility], [place] [address] You do not need an appointment.  They are open from 8 am- 4:30 pm.  Lunch from 1:00 pm- 2:00 pm You DO NOT need to be fasting.   You may also go to one of the following [facility]:  [address] [place], [place] [address] Phone: [phone] Lab hours: Mon-Fri 8 am- 5 pm    Lunch 12 pm- 1 pm  [address] [place],  [place]  [address]  [place]  Phone: [phone] Lab hours: 7 am- 4 pm Lunch 12 pm-1 pm   [address] [place],  [place]  [address]  [place]  Phone: [phone] Lab hours: Mon-Fri 8 am- 5 pm    Lunch 12 pm- 1 pm  If you have labs (blood work) drawn today and your tests are completely normal, you will receive your results only by: MyChart Message (if you have MyChart) OR A paper copy in the mail If you have any lab test that is abnormal or we need to change your treatment, we will call you to review the results.  Testing/Procedures: Your provider has ordered a Lexiscan / Exercise Myoview  Stress test. This will take place at [facility]. Please report to the [facility] medical mall entrance. The volunteers at the first desk will direct you where to go.  [facility] MYOVIEW   Your provider has ordered a Stress Test with nuclear imaging. The purpose of this test is to evaluate the blood supply to your heart muscle. This procedure is referred to as a "Non-Invasive Stress Test." This is because other than having an IV started in your vein, nothing is inserted or "invades" your body. Cardiac stress tests are done to find areas of  poor blood flow to the heart by determining the extent of coronary artery disease (CAD). Some patients exercise on a treadmill, which naturally increases the blood flow to your heart, while others who are unable to walk on a treadmill due to physical limitations will have a pharmacologic/chemical stress agent called Lexiscan  . This medicine will mimic walking on a treadmill by temporarily increasing your coronary blood flow.   Please note: these test may take anywhere between 2-4 hours to complete  How to prepare for your Myoview  test:  Nothing to eat for 6 hours prior to the test No caffeine for 24 hours prior to test No smoking 24 hours prior to test. Your medication may be taken with water.  If your doctor stopped a medication because of this test, do not take that medication. Ladies, please do not wear dresses.  Skirts or pants are appropriate. Please wear a short sleeve shirt. No perfume, cologne or lotion. Wear comfortable walking shoes. No heels!   PLEASE NOTIFY THE OFFICE AT LEAST 24 HOURS IN ADVANCE IF YOU ARE UNABLE TO KEEP YOUR APPOINTMENT.  [phone] AND  PLEASE NOTIFY NUCLEAR MEDICINE AT [facility] AT LEAST 24 HOURS IN ADVANCE IF YOU ARE UNABLE TO KEEP YOUR APPOINTMENT. [phone]   Follow-Up: At [facility], you and your health needs are our priority.  As part of our continuing mission to  provide you with exceptional heart care, our providers are all part of one team.  This team includes your primary Cardiologist (physician) and Advanced Practice Providers or APPs (Physician Assistants and Nurse Practitioners) who all work together to provide you with the care you need, when you need it.  Your next appointment:   4 week(s)  Provider:   Jann Melody, MD    We recommend signing up for the patient portal called "MyChart".  Sign up information is provided on this After Visit Summary.  MyChart is used to connect with patients for Virtual Visits (Telemedicine).   Patients are able to view lab/test results, encounter notes, upcoming appointments, etc.  Non-urgent messages can be sent to your provider as well.   To learn more about what you can do with MyChart, go to [email or web address].   Other Instructions  Weight Log:  To create a daily weight log, start by weighing yourself every morning before eating or drinking, after using the bathroom, and with minimal clothing. Record your weight each day in a journal, chart, or online app. Keep a consistent routine and bring the log to your healthcare provider's appointments.  Blood Pressure Measuring and Log:  Take your blood pressure at the same time each day (ex: morning or Evening) or as your health care professional recommends. Sit up with your back straight and supported and your feet flat on the floor. Support your arm on a flat surface with your upper arm at heart level.

## 2023-10-26 NOTE — Telephone Encounter (Signed)
 Patient given detailed instructions per Myocardial Perfusion Study Information Sheet for the test on 10/27/23 Patient notified to arrive 15 minutes early and that it is imperative to arrive on time for appointment to keep from having the test rescheduled.  If you need to cancel or reschedule your appointment, please call the office within 24 hours of your appointment. . Patient verbalized understanding. Lawrence Bailey

## 2023-10-27 ENCOUNTER — Telehealth: Payer: Self-pay | Admitting: Cardiology

## 2023-10-27 ENCOUNTER — Ambulatory Visit (HOSPITAL_COMMUNITY)
Admission: RE | Admit: 2023-10-27 | Discharge: 2023-10-27 | Disposition: A | Discharge status: Home / Self Care | Source: Ambulatory Visit | Attending: Cardiology | Admitting: Cardiology

## 2023-10-27 ENCOUNTER — Other Ambulatory Visit: Payer: Self-pay | Admitting: Emergency Medicine

## 2023-10-27 ENCOUNTER — Telehealth: Payer: Self-pay | Admitting: Emergency Medicine

## 2023-10-27 ENCOUNTER — Telehealth: Payer: Self-pay

## 2023-10-27 DIAGNOSIS — I429 Cardiomyopathy, unspecified: Secondary | ICD-10-CM | POA: Diagnosis present

## 2023-10-27 DIAGNOSIS — I5020 Unspecified systolic (congestive) heart failure: Secondary | ICD-10-CM | POA: Diagnosis not present

## 2023-10-27 MED ORDER — REGADENOSON 0.4 MG/5ML IV SOLN
INTRAVENOUS | Status: AC
  Filled 2023-10-27: qty 5

## 2023-10-27 MED ORDER — TECHNETIUM TC 99M TETROFOSMIN IV KIT
10.9000 | PACK | Freq: Once | INTRAVENOUS | Status: AC | PRN
  Administered 2023-10-27: 10.9 via INTRAVENOUS

## 2023-10-27 MED ORDER — REGADENOSON 0.4 MG/5ML IV SOLN
0.4000 mg | Freq: Once | INTRAVENOUS | Status: AC
  Administered 2023-10-27: 0.4 mg via INTRAVENOUS

## 2023-10-27 MED ORDER — TECHNETIUM TC 99M TETROFOSMIN IV KIT
31.8000 | PACK | Freq: Once | INTRAVENOUS | Status: AC | PRN
  Administered 2023-10-27: 31.8 via INTRAVENOUS

## 2023-10-27 NOTE — Telephone Encounter (Signed)
 Pt's wife is requesting a callback regarding pt needing a new prescription sent for his cpap equipment since the one sent didn't have a signature so pt is unable to get what he needs. Please advise.

## 2023-10-27 NOTE — Telephone Encounter (Signed)
 After leaving a voicemail on patient's wife's cell phone to call back (per DPR), patient's wife, [name] called back and spoke with this RN. We discussed wanting to have an Echo scheduled for the patient during the first week of July, per DW. Also discussed the limited availability at [place], and more availability here in [place]. [name] seemed agreeable to getting that scheduled but she said they both have a lot of doctor's appointments and she did not have access to her appointment calendar at this time, so she would call me back in the morning to get the Echo scheduled.

## 2023-10-27 NOTE — Progress Notes (Signed)
 Per Morey Ar, NP, the patient needs to be scheduled to have a limited Echo prior to his appointment on July 7.  She asked if we could schedule him in [place] sometime during the first week of July for a limited Echo.  When this RN inquired about the availability, found out that there is no availability in [place], but there is availability here in [place]. Called the patient to inquire if they are able to come to [facility] to get the ECHO done since there are no openings for an ECHO in [place] during the first week of July.  No one picked up the phone and was unable to leave a detailed message (per DPR) so left a message asking the patient to call back and provided the phone numbers.

## 2023-10-27 NOTE — Telephone Encounter (Signed)
 PT came in today 10/27/2023   Needing a xR for his meds asap he has two pills left they want refill it the way it is so PT says it needs to be wrote in a way so it's not to soon to refill.

## 2023-10-27 NOTE — Telephone Encounter (Signed)
 Please advise on this for Dr. Micael Adas.  "PT came in today 10/27/2023 Needing a xR for his meds asap he has two pills left they want refill it the way it is so PT says it needs to be wrote in a way so it's not to soon to refill."

## 2023-10-28 ENCOUNTER — Telehealth: Payer: Self-pay | Admitting: Emergency Medicine

## 2023-10-28 LAB — MYOCARDIAL PERFUSION IMAGING
LV dias vol: 150 mL (ref 62–150)
LV sys vol: 78 mL
Nuc Stress EF: 48 %
Peak HR: 81 {beats}/min
Rest HR: 68 {beats}/min
Rest Nuclear Isotope Dose: 10.9 mCi
SDS: 0
SRS: 13
SSS: 2
Stress Nuclear Isotope Dose: 31.8 mCi
TID: 1.19

## 2023-10-28 NOTE — Telephone Encounter (Signed)
 Called the patient's wife (per DPR) to see if we could get that Echo scheduled for the first week of July. The call was unanswered, voicemail left with a request left for a call back.

## 2023-10-29 ENCOUNTER — Other Ambulatory Visit: Payer: Self-pay | Admitting: *Deleted

## 2023-10-29 DIAGNOSIS — I480 Paroxysmal atrial fibrillation: Secondary | ICD-10-CM

## 2023-10-29 MED ORDER — WARFARIN SODIUM 5 MG PO TABS: ORAL_TABLET | ORAL | 1 refills | Status: DC

## 2023-10-29 NOTE — Telephone Encounter (Signed)
 Notified patient that [facility] has supply order since 5/13. [facility] confirmed receipt of order and will reach out to patient to notify order has been expedited. I apologized to patient for delay, patient understanding an pleasant. All questions were answered and patient verbalized understanding.

## 2023-10-29 NOTE — Telephone Encounter (Signed)
 Patient's wife is calling because the patient is needing a new prescription for CPAP supplies sent to [facility]. Patient's wife is requesting a call back in regard to this. Please advise.

## 2023-10-29 NOTE — Telephone Encounter (Signed)
 Warfarin 5mg  refill requested by pt's wife.  Afib Last INR 09/21/23 Last OV 10/26/23

## 2023-11-01 ENCOUNTER — Ambulatory Visit: Payer: Self-pay | Admitting: Student

## 2023-11-01 NOTE — Telephone Encounter (Signed)
 Call to patient to verify that he was able to get his medications refilled, patient confirms he was.

## 2023-11-02 ENCOUNTER — Telehealth: Payer: Self-pay | Admitting: Emergency Medicine

## 2023-11-02 ENCOUNTER — Other Ambulatory Visit: Payer: Self-pay | Admitting: Emergency Medicine

## 2023-11-02 ENCOUNTER — Ambulatory Visit: Attending: Cardiology

## 2023-11-02 DIAGNOSIS — I480 Paroxysmal atrial fibrillation: Secondary | ICD-10-CM | POA: Diagnosis not present

## 2023-11-02 DIAGNOSIS — R0609 Other forms of dyspnea: Secondary | ICD-10-CM

## 2023-11-02 DIAGNOSIS — D6869 Other thrombophilia: Secondary | ICD-10-CM

## 2023-11-02 DIAGNOSIS — Z5181 Encounter for therapeutic drug level monitoring: Secondary | ICD-10-CM

## 2023-11-02 DIAGNOSIS — I5020 Unspecified systolic (congestive) heart failure: Secondary | ICD-10-CM

## 2023-11-02 LAB — POCT INR: INR: 2.3 (ref 2.0–3.0)

## 2023-11-02 MED ORDER — METOPROLOL TARTRATE 100 MG PO TABS: ORAL_TABLET | ORAL | 0 refills | Status: DC

## 2023-11-02 MED ORDER — METOPROLOL TARTRATE 25 MG PO TABS: 25.0000 mg | ORAL_TABLET | Freq: Once | ORAL | 0 refills | Status: DC

## 2023-11-02 NOTE — Telephone Encounter (Signed)
 Patient's wife [name]) called to ask if it was ok to have CTA scheduled for the patient.  [name] confirmed that it was.  [name] also notified of having [name] take 100 mg Metoprolol 2 hours prior to CTA procedure and having the prescription for that sent to their pharmacy.  [name] verbalized understanding with all questions and concerns addressed.  Order for CTA as well as for the Metoprolol 100 mg put in. Pt had BMP on 10/23/23.  CTA accompanying order for BMP cancelled.

## 2023-11-02 NOTE — Patient Instructions (Signed)
 Continue taking 5mg  (1 tablet) daily except 10mg  (2 tablets) on Mondays, Wednesdays and Fridays  Remain consistent with greens each week (2 serving per week).  Recheck in 6 weeks.  [facility]  Cl[facility] [phone]

## 2023-11-02 NOTE — Progress Notes (Signed)
 Per DW and CTA protocols, order for pre-op Metoprolol changed to 25 mg.  Called [facility] pharmacy, spoke with Cathleen Coach, and confirmed that 100 mg order discontinued and replaced with 25 mg order.  Also called and spoke with Benetta Brake, patient's wife to inform her of the change.  [name] verbalized understanding.

## 2023-11-03 ENCOUNTER — Encounter: Payer: Self-pay | Admitting: Internal Medicine

## 2023-11-04 ENCOUNTER — Other Ambulatory Visit: Payer: Self-pay | Admitting: Internal Medicine

## 2023-11-05 DIAGNOSIS — G4733 Obstructive sleep apnea (adult) (pediatric): Secondary | ICD-10-CM | POA: Diagnosis not present

## 2023-11-11 ENCOUNTER — Ambulatory Visit: Payer: Self-pay | Admitting: Student

## 2023-11-11 DIAGNOSIS — R0609 Other forms of dyspnea: Secondary | ICD-10-CM

## 2023-11-11 NOTE — Telephone Encounter (Signed)
 Called [place] of CTA scheduling at [phone] to inquire about scheduling CTA for the patient. Phone was not answered, voicemail left with number left for call back. Order for Limited ECHO placed and asked for assistance from [facility] Scheduling to help get the patient scheduled for a limited ECHO.

## 2023-11-11 NOTE — Telephone Encounter (Signed)
 Called patient and wife on their cell phone number [phone]). Phone call was unanswered.  Message on voicemail left inquiring about their availability for scheduling an ECHO in the [place] office. Also inquired about having the CTA scheduled. Phone number for call back provided.

## 2023-11-12 ENCOUNTER — Telehealth: Payer: Self-pay | Admitting: Emergency Medicine

## 2023-11-12 NOTE — Telephone Encounter (Signed)
 Returned patient's call regarding the need to have a limited ECHO and CT Coronary Angiogram scheduled.  Patient was informed that a limited ECHO looks at the specific heart function that we are interested in and the CTA looks at the blood vessels heart to visualize blockages or abnormalities.  Patient stated that I just had a stress test and an ECHO, I've had all these tests done with no results.  Patient stated that he wanted to think about this a bit more before making up his mind about scheduling these tests.  Patient advised to call us  back when he has made up his mind.

## 2023-11-18 ENCOUNTER — Encounter: Payer: Self-pay | Admitting: Internal Medicine

## 2023-11-18 ENCOUNTER — Other Ambulatory Visit: Payer: Self-pay | Admitting: Internal Medicine

## 2023-11-21 DIAGNOSIS — G4733 Obstructive sleep apnea (adult) (pediatric): Secondary | ICD-10-CM | POA: Diagnosis not present

## 2023-11-29 ENCOUNTER — Ambulatory Visit: Attending: Cardiology | Admitting: Cardiology

## 2023-11-29 ENCOUNTER — Encounter: Payer: Self-pay | Admitting: Cardiology

## 2023-11-29 VITALS — BP 134/72 | HR 62 | Ht 68.0 in | Wt 177.4 lb

## 2023-11-29 DIAGNOSIS — I10 Essential (primary) hypertension: Secondary | ICD-10-CM | POA: Diagnosis not present

## 2023-11-29 DIAGNOSIS — G4733 Obstructive sleep apnea (adult) (pediatric): Secondary | ICD-10-CM | POA: Diagnosis not present

## 2023-11-29 NOTE — Progress Notes (Signed)
 Sleep Medicine Note    Date:  11/29/2023   ID:  Lawrence Bailey, DOB 01-29-1941, MRN [id number]  PCP:  Lawrence Medici, MD  Cardiologist: Lawrence DELENA Leavens, MD   Chief Complaint  Patient presents with   Sleep Apnea   Hypertension    History of Present Illness:  Lawrence Bailey is a 83 y.o. male with a history of CAD, PAF, recurrent syncope, hypertension and prostate cancer who is followed by EP for PAF and recurrent syncope.  Due to paroxysmal atrial fibrillation a sleep study was ordered because his episodes occur predominantly at night and his wife was complaining that he was snoring.  He underwent home sleep study showing mild obstructive sleep apnea with an AHI of 9.3/h overall and moderate sleep apnea during REM sleep with REM AHI 17/h.  He was started on auto CPAP from 4 to 15 cm H2O.    He is doing well with his PAP device and thinks that he has gotten used to it.  He tolerates the full face mask and feels the pressure is adequate.  Since going on PAP he feels rested in the am and has no significant daytime sleepiness but occasionally will nap during the day.  He sometimes has some nasal and throat dryness. He denies nasal congestion.  He does not think that he snores significantly according to what his wife tells him.       Past Medical History:  Diagnosis Date   BPH (benign prostatic hyperplasia)    Coronary artery disease    GIST (gastrointestinal stromal tumor), malignant (HCC) dx'd 04/2007   gleevac comp 04/2008   Glaucoma    Hypertension    OSA on CPAP    mild obstructive sleep apnea with an AHI of 9.3/h overall and moderate sleep apnea during REM sleep with REM AHI 17/h.   Prostate cancer [facility])    Renal insufficiency     Past Surgical History:  Procedure Laterality Date   ABDOMINAL SURGERY     EYE SURGERY     HERNIA REPAIR     KNEE SURGERY      Current Medications: Current Meds  Medication Sig   amLODipine  (NORVASC ) 10 MG tablet TAKE 1 TABLET BY  MOUTH EVERY DAY   atorvastatin  (LIPITOR) 10 MG tablet Take 1 tablet (10 mg total) by mouth See admin instructions. Take 1 tablet by mouth Monday - Friday at bedtime   brimonidine  (ALPHAGAN ) 0.2 % ophthalmic solution 1 drop 2 (two) times daily.   COSOPT  PF 2-0.5 % SOLN Apply 1 drop to eye 2 (two) times daily.   docusate sodium  (COLACE) 100 MG capsule Take 100 mg by mouth daily as needed for mild constipation.   famotidine  (PEPCID ) 20 MG tablet TAKE 1 TABLET BY MOUTH EVERY DAY   finasteride  (PROSCAR ) 5 MG tablet Take 5 mg by mouth daily.   fluticasone  (FLONASE ) 50 MCG/ACT nasal spray PLACE 2 SPRAYS INTO BOTH NOSTRILS SEE ADMIN INSTRUCTIONS. INHALE 2 SPRAYS INTO EACH NOSTRIL EVERY NIGHT   furosemide  (LASIX ) 20 MG tablet Take 1 tablet (20 mg total) by mouth daily.   latanoprost  (XALATAN ) 0.005 % ophthalmic solution Place 1 drop into both eyes at bedtime.   metoprolol  tartrate (LOPRESSOR ) 25 MG tablet Take 1 tablet (25 mg total) by mouth once for 1 dose. Take 2 hours prior to CTA procedure.   polyethylene glycol (MIRALAX  / GLYCOLAX ) 17 g packet Take 17 g by mouth daily. (Patient taking differently: Take 17 g by  mouth as needed for moderate constipation.)   sildenafil  (REVATIO ) 20 MG tablet Take 1 tablet (20 mg total) by mouth as needed.   spironolactone  (ALDACTONE ) 25 MG tablet Take 0.5 tablets (12.5 mg total) by mouth daily.   tamsulosin  (FLOMAX ) 0.4 MG CAPS capsule Take 0.4 mg by mouth daily.   traMADol  (ULTRAM ) 50 MG tablet Take 1 tablet (50 mg total) by mouth every 12 (twelve) hours as needed.   valsartan  (DIOVAN ) 40 MG tablet Take 1 tablet (40 mg total) by mouth daily.   warfarin (COUMADIN ) 5 MG tablet TAKE 1 TO 2 TABLETS BY MOUTH DAILY OR AS DIRECTED BY ANTICOAGULATION CLINIC.    Allergies:   Other and Sucralfate   Social History   Socioeconomic History   Marital status: Married    Spouse name: Not on file   Number of children: Not on file   Years of education: Not on file   Highest  education level: 11th grade  Occupational History   Occupation: retired  Tobacco Use   Smoking status: Never   Smokeless tobacco: Never  Vaping Use   Vaping status: Never Used  Substance and Sexual Activity   Alcohol use: Not Currently   Drug use: No   Sexual activity: Yes  Other Topics Concern   Not on file  Social History Narrative   Not on file   Social Drivers of Health   Financial Resource Strain: Medium Risk (06/15/2023)   Overall Financial Resource Strain (CARDIA)    Difficulty of Paying Living Expenses: Somewhat hard  Food Insecurity: No Food Insecurity (06/15/2023)   Hunger Vital Sign    Worried About Running Out of Food in the Last Year: Never true    Ran Out of Food in the Last Year: Never true  Transportation Needs: No Transportation Needs (06/15/2023)   PRAPARE - Administrator, Civil Service (Medical): No    Lack of Transportation (Non-Medical): No  Physical Activity: Insufficiently Active (06/15/2023)   Exercise Vital Sign    Days of Exercise per Week: 1 day    Minutes of Exercise per Session: 20 min  Stress: No Stress Concern Present (06/15/2023)   [employer] of Occupational Health - Occupational Stress Questionnaire    Feeling of Stress : Not at all  Social Connections: Unknown (06/15/2023)   Social Connection and Isolation Panel    Frequency of Communication with Friends and Family: More than three times a week    Frequency of Social Gatherings with Friends and Family: Once a week    Attends Religious Services: Patient declined    Database administrator or Organizations: No    Attends Engineer, structural: Never    Marital Status: Married     Family History:  The patient's family history includes Cancer in his mother; Hypertension in his mother.   ROS:   Please see the history of present illness.    ROS All other systems reviewed and are negative.      No data to display             PHYSICAL EXAM:   VS:  BP 134/72    Pulse 62   Ht 5' 8 (1.727 m)   Wt 177 lb 6.4 oz (80.5 kg)   SpO2 99%   BMI 26.97 kg/m    GEN: Well nourished, well developed in no acute distress HEENT: Normal NECK: No JVD; No carotid bruits LYMPHATICS: No lymphadenopathy CARDIAC:RRR, no murmurs, rubs, gallops RESPIRATORY:  Clear to  auscultation without rales, wheezing or rhonchi  ABDOMEN: Soft, non-tender, non-distended MUSCULOSKELETAL:  No edema; No deformity  SKIN: Warm and dry NEUROLOGIC:  Alert and oriented x 3 PSYCHIATRIC:  Normal affect  Wt Readings from Last 3 Encounters:  11/29/23 177 lb 6.4 oz (80.5 kg)  10/26/23 178 lb 9.6 oz (81 kg)  10/23/23 177 lb 14.6 oz (80.7 kg)      Studies/Labs Reviewed:   Home sleep study and PAP compliance download  Recent Labs: 12/10/2022: TSH 1.330 02/02/2023: Magnesium  2.2 10/23/2023: ALT 13; BUN 21; Creatinine, Ser 1.20; Hemoglobin 12.9; Platelets 140; Potassium 4.6; Sodium 140    ASSESSMENT:    1. OSA (obstructive sleep apnea)   2. Essential hypertension        PLAN:  In order of problems listed above:  #OSA - The patient is tolerating PAP therapy well without any problems. The PAP download performed by his DME was personally reviewed and interpreted by me today and showed an AHI of 3.5 /hr on auto CPAP from 4-15 cm H2O with 57 % compliance in using more than 4 hours nightly.  The patient has been using and benefiting from PAP use and will continue to benefit from therapy.  -his compliance was down due to needing a new part that took a few weeks to get and he could not use it -I encouraged him to be more compliant with his device  #Hypertension - BP is controlled today on exam - Continue amlodipine  10 mg daily, spironolactone  12.5 mg daily, valsartan  40 mg daily with as needed refills -I have personally reviewed and interpreted outside labs performed by patient's PCP which showed SCr 1.2 and K+ 4.6 on 10/23/2023  Followup with me in 1 year  Time Spent: 20 minutes  total time of encounter, including 15 minutes spent in face-to-face patient care on the date of this encounter. This time includes coordination of care and counseling regarding above mentioned problem list. Remainder of non-face-to-face time involved reviewing chart documents/testing relevant to the patient encounter and documentation in the medical record. I have independently reviewed documentation from referring provider  Medication Adjustments/Labs and Tests Ordered: Current medicines are reviewed at length with the patient today.  Concerns regarding medicines are outlined above.  Medication changes, Labs and Tests ordered today are listed in the Patient Instructions below.  There are no Patient Instructions on file for this visit.   Signed, Wilbert Bihari, MD  11/29/2023 8:17 AM    [facility] [address] [place], [place], [place]  [address] Phone: [phone]; Fax: [phone]

## 2023-11-29 NOTE — Patient Instructions (Signed)
 Medication Instructions:  Your physician recommends that you continue on your current medications as directed. Please refer to the Current Medication list given to you today.  *If you need a refill on your cardiac medications before your next appointment, please call your pharmacy*  Lab Work: None.  If you have labs (blood work) drawn today and your tests are completely normal, you will receive your results only by: MyChart Message (if you have MyChart) OR A paper copy in the mail If you have any lab test that is abnormal or we need to change your treatment, we will call you to review the results.  Testing/Procedures: None.  Follow-Up: At [facility], you and your health needs are our priority.  As part of our continuing mission to provide you with exceptional heart care, our providers are all part of one team.  This team includes your primary Cardiologist (physician) and Advanced Practice Providers or APPs (Physician Assistants and Nurse Practitioners) who all work together to provide you with the care you need, when you need it.  Your next appointment:   1 year(s)  Provider:   Dr. Wilbert Bihari, MD     Other Instructions You are due to see Dr. EMERSON Leavens for cardiology follow up in November 2025. Please make an appointment with Dr. Leavens before you leave today.

## 2023-12-06 ENCOUNTER — Other Ambulatory Visit: Payer: Self-pay | Admitting: Internal Medicine

## 2023-12-14 ENCOUNTER — Ambulatory Visit: Attending: Internal Medicine

## 2023-12-14 ENCOUNTER — Ambulatory Visit (INDEPENDENT_AMBULATORY_CARE_PROVIDER_SITE_OTHER): Payer: No Typology Code available for payment source | Admitting: Internal Medicine

## 2023-12-14 VITALS — BP 134/70 | HR 85 | Temp 97.7°F | Ht 68.0 in | Wt 181.8 lb

## 2023-12-14 DIAGNOSIS — D6869 Other thrombophilia: Secondary | ICD-10-CM

## 2023-12-14 DIAGNOSIS — G4733 Obstructive sleep apnea (adult) (pediatric): Secondary | ICD-10-CM | POA: Diagnosis not present

## 2023-12-14 DIAGNOSIS — N1831 Chronic kidney disease, stage 3a: Secondary | ICD-10-CM

## 2023-12-14 DIAGNOSIS — Z5181 Encounter for therapeutic drug level monitoring: Secondary | ICD-10-CM

## 2023-12-14 DIAGNOSIS — I700 Atherosclerosis of aorta: Secondary | ICD-10-CM | POA: Diagnosis not present

## 2023-12-14 DIAGNOSIS — I5022 Chronic systolic (congestive) heart failure: Secondary | ICD-10-CM | POA: Diagnosis not present

## 2023-12-14 DIAGNOSIS — I480 Paroxysmal atrial fibrillation: Secondary | ICD-10-CM

## 2023-12-14 DIAGNOSIS — R7309 Other abnormal glucose: Secondary | ICD-10-CM | POA: Diagnosis not present

## 2023-12-14 DIAGNOSIS — D472 Monoclonal gammopathy: Secondary | ICD-10-CM

## 2023-12-14 DIAGNOSIS — H6123 Impacted cerumen, bilateral: Secondary | ICD-10-CM | POA: Diagnosis not present

## 2023-12-14 DIAGNOSIS — Z9989 Dependence on other enabling machines and devices: Secondary | ICD-10-CM | POA: Diagnosis not present

## 2023-12-14 DIAGNOSIS — I1310 Hypertensive heart and chronic kidney disease without heart failure, with stage 1 through stage 4 chronic kidney disease, or unspecified chronic kidney disease: Secondary | ICD-10-CM

## 2023-12-14 DIAGNOSIS — I130 Hypertensive heart and chronic kidney disease with heart failure and stage 1 through stage 4 chronic kidney disease, or unspecified chronic kidney disease: Secondary | ICD-10-CM

## 2023-12-14 DIAGNOSIS — R739 Hyperglycemia, unspecified: Secondary | ICD-10-CM

## 2023-12-14 LAB — POCT INR: INR: 2.2 (ref 2.0–3.0)

## 2023-12-14 NOTE — Patient Instructions (Signed)
 Continue taking 5mg  (1 tablet) daily except 10mg  (2 tablets) on Mondays, Wednesdays and Fridays  Remain consistent with greens each week (2 serving per week).  Recheck in 6 weeks.  [facility]  Cl[facility] [phone]

## 2023-12-14 NOTE — Patient Instructions (Signed)
 Hypertension, Adult Hypertension is another name for high blood pressure. High blood pressure forces your heart to work harder to pump blood. This can cause problems over time. There are two numbers in a blood pressure reading. There is a top number (systolic) over a bottom number (diastolic). It is best to have a blood pressure that is below 120/80. What are the causes? The cause of this condition is not known. Some other conditions can lead to high blood pressure. What increases the risk? Some lifestyle factors can make you more likely to develop high blood pressure: Smoking. Not getting enough exercise or physical activity. Being overweight. Having too much fat, sugar, calories, or salt (sodium) in your diet. Drinking too much alcohol. Other risk factors include: Having any of these conditions: Heart disease. Diabetes. High cholesterol. Kidney disease. Obstructive sleep apnea. Having a family history of high blood pressure and high cholesterol. Age. The risk increases with age. Stress. What are the signs or symptoms? High blood pressure may not cause symptoms. Very high blood pressure (hypertensive crisis) may cause: Headache. Fast or uneven heartbeats (palpitations). Shortness of breath. Nosebleed. Vomiting or feeling like you may vomit (nauseous). Changes in how you see. Very bad chest pain. Feeling dizzy. Seizures. How is this treated? This condition is treated by making healthy lifestyle changes, such as: Eating healthy foods. Exercising more. Drinking less alcohol. Your doctor may prescribe medicine if lifestyle changes do not help enough and if: Your top number is above 130. Your bottom number is above 80. Your personal target blood pressure may vary. Follow these instructions at home: Eating and drinking  If told, follow the DASH eating plan. To follow this plan: Fill one half of your plate at each meal with fruits and vegetables. Fill one fourth of your plate  at each meal with whole grains. Whole grains include whole-wheat pasta, brown rice, and whole-grain bread. Eat or drink low-fat dairy products, such as skim milk or low-fat yogurt. Fill one fourth of your plate at each meal with low-fat (lean) proteins. Low-fat proteins include fish, chicken without skin, eggs, beans, and tofu. Avoid fatty meat, cured and processed meat, or chicken with skin. Avoid pre-made or processed food. Limit the amount of salt in your diet to less than 1,500 mg each day. Do not drink alcohol if: Your doctor tells you not to drink. You are pregnant, may be pregnant, or are planning to become pregnant. If you drink alcohol: Limit how much you have to: 0-1 drink a day for women. 0-2 drinks a day for men. Know how much alcohol is in your drink. In the [place] one drink equals one 12 oz bottle of beer (355 mL), one 5 oz glass of wine (148 mL), or one 1 oz glass of hard liquor (44 mL). Lifestyle  Work with your doctor to stay at a healthy weight or to lose weight. Ask your doctor what the best weight is for you. Get at least 30 minutes of exercise that causes your heart to beat faster (aerobic exercise) most days of the week. This may include walking, swimming, or biking. Get at least 30 minutes of exercise that strengthens your muscles (resistance exercise) at least 3 days a week. This may include lifting weights or doing Pilates. Do not smoke or use any products that contain nicotine or tobacco. If you need help quitting, ask your doctor. Check your blood pressure at home as told by your doctor. Keep all follow-up visits. Medicines Take over-the-counter and prescription medicines  only as told by your doctor. Follow directions carefully. Do not skip doses of blood pressure medicine. The medicine does not work as well if you skip doses. Skipping doses also puts you at risk for problems. Ask your doctor about side effects or reactions to medicines that you should watch  for. Contact a doctor if: You think you are having a reaction to the medicine you are taking. You have headaches that keep coming back. You feel dizzy. You have swelling in your ankles. You have trouble with your vision. Get help right away if: You get a very bad headache. You start to feel mixed up (confused). You feel weak or numb. You feel faint. You have very bad pain in your: Chest. Belly (abdomen). You vomit more than once. You have trouble breathing. These symptoms may be an emergency. Get help right away. Call 911. Do not wait to see if the symptoms will go away. Do not drive yourself to the hospital. Summary Hypertension is another name for high blood pressure. High blood pressure forces your heart to work harder to pump blood. For most people, a normal blood pressure is less than 120/80. Making healthy choices can help lower blood pressure. If your blood pressure does not get lower with healthy choices, you may need to take medicine. This information is not intended to replace advice given to you by your health care provider. Make sure you discuss any questions you have with your health care provider. Document Revised: 02/27/2021 Document Reviewed: 02/27/2021 Elsevier Patient Education  2024 [employer].

## 2023-12-14 NOTE — Progress Notes (Signed)
 INR-2.2 Please see anticoagulation encounter

## 2023-12-14 NOTE — Progress Notes (Signed)
 I,Lawrence Bailey, CMA,acting as a Neurosurgeon for Lawrence LOISE Slocumb, MD.,have documented all relevant documentation on the behalf of Lawrence LOISE Slocumb, MD,as directed by  Lawrence LOISE Slocumb, MD while in the presence of Lawrence LOISE Slocumb, MD.  Subjective:  Patient ID: Lawrence Bailey , male    DOB: 05/04/1941 , 83 y.o.   MRN: [id number]  Chief Complaint  Patient presents with   Hypertension    Patient presents today for bpc. He reports compliance with medications. Denies headaches, chest pain & sob.    HPI Discussed the use of AI scribe software for clinical note transcription with the patient, who gave verbal consent to proceed.  History of Present Illness ZAKHAI Bailey is an 83 year old male with a history of hypertension, OSA and heart failure who presents for a follow-up visit.  He uses a CPAP machine at night for sleep apnea and is compliant with this treatment. He is currently taking Lasix  and spironolactone  for fluid around the heart, and valsartan  and amlodipine  for blood pressure control.  His energy levels are described as 'pretty good' and he has not experienced recent shortness of breath.  He is concerned about dietary restrictions due to Coumadin  use, particularly his inability to eat greens and vegetables as he would like. He has not yet incorporated greens into his diet.  He has undergone a hearing test previously, which involved an uncomfortable procedure, and has seen an ENT specialist, Dr. Teo, for ear issues, where his ears were flushed.  He attends a Coumadin  clinic and sees a hematologist every six months. He recently had a pulmonary test and a two-week lab visit in June.   Hypertension This is a chronic problem. The current episode started more than 1 year ago. The problem has been gradually improving since onset. The problem is controlled. Pertinent negatives include no blurred vision, chest pain or headaches. Risk factors for coronary artery disease include male gender. The  current treatment provides moderate improvement. Hypertensive end-organ damage includes kidney disease.     Past Medical History:  Diagnosis Date   Allergy 1980   Bees   Anemia 2008   Arthritis    BPH (benign prostatic hyperplasia)    Cataract    Coronary artery disease    GERD (gastroesophageal reflux disease) 2000   GIST (gastrointestinal stromal tumor), malignant (HCC) dx'd 04/2007   gleevac comp 04/2008   Glaucoma    Heart murmur 1941   Irregular heartbeat   Hypertension    Myocardial infarction (HCC)    1982   OSA on CPAP    mild obstructive sleep apnea with an AHI of 9.3/h overall and moderate sleep apnea during REM sleep with REM AHI 17/h.   Oxygen deficiency    2024   Prostate cancer [facility])    Renal insufficiency    Sleep apnea 2024   Stroke [facility]) 2004   Ulcer      Family History  Problem Relation Age of Onset   Hypertension Mother    Cancer Mother    Arthritis Mother    Hearing loss Mother    Kidney disease Mother    Vision loss Mother    Breast cancer Neg Hx    Prostate cancer Neg Hx    Colon cancer Neg Hx    Pancreatic cancer Neg Hx      Current Outpatient Medications:    amLODipine  (NORVASC ) 10 MG tablet, TAKE 1 TABLET BY MOUTH EVERY DAY, Disp: 90 tablet, Rfl: 3   atorvastatin  (  LIPITOR) 10 MG tablet, TAKE 1 TABLET (10 MG TOTAL) BY MOUTH SEE ADMIN INSTRUCTIONS. TAKE 1 TABLET BY MOUTH MONDAY - FRIDAY AT BEDTIME, Disp: 90 tablet, Rfl: 1   brimonidine  (ALPHAGAN ) 0.2 % ophthalmic solution, 1 drop 2 (two) times daily., Disp: , Rfl:    COSOPT  PF 2-0.5 % SOLN, Apply 1 drop to eye 2 (two) times daily., Disp: , Rfl:    docusate sodium  (COLACE) 100 MG capsule, Take 100 mg by mouth daily as needed for mild constipation., Disp: , Rfl:    famotidine  (PEPCID ) 20 MG tablet, TAKE 1 TABLET BY MOUTH EVERY DAY, Disp: 90 tablet, Rfl: 1   finasteride  (PROSCAR ) 5 MG tablet, Take 5 mg by mouth daily., Disp: , Rfl:    fluticasone  (FLONASE ) 50 MCG/ACT nasal spray, PLACE 2  SPRAYS INTO BOTH NOSTRILS SEE ADMIN INSTRUCTIONS. INHALE 2 SPRAYS INTO EACH NOSTRIL EVERY NIGHT, Disp: 48 mL, Rfl: 1   furosemide  (LASIX ) 20 MG tablet, Take 1 tablet (20 mg total) by mouth daily., Disp: 90 tablet, Rfl: 3   latanoprost  (XALATAN ) 0.005 % ophthalmic solution, Place 1 drop into both eyes at bedtime., Disp: , Rfl:    metoprolol  tartrate (LOPRESSOR ) 25 MG tablet, Take 1 tablet (25 mg total) by mouth once for 1 dose. Take 2 hours prior to CTA procedure., Disp: 1 tablet, Rfl: 0   polyethylene glycol (MIRALAX  / GLYCOLAX ) 17 g packet, Take 17 g by mouth daily. (Patient taking differently: Take 17 g by mouth as needed for moderate constipation.), Disp: 14 each, Rfl: 0   sildenafil  (REVATIO ) 20 MG tablet, Take 1 tablet (20 mg total) by mouth as needed., Disp: 10 tablet, Rfl: 0   spironolactone  (ALDACTONE ) 25 MG tablet, Take 0.5 tablets (12.5 mg total) by mouth daily., Disp: 45 tablet, Rfl: 3   tamsulosin  (FLOMAX ) 0.4 MG CAPS capsule, Take 0.4 mg by mouth daily., Disp: , Rfl:    traMADol  (ULTRAM ) 50 MG tablet, Take 1 tablet (50 mg total) by mouth every 12 (twelve) hours as needed., Disp: 20 tablet, Rfl: 0   valsartan  (DIOVAN ) 40 MG tablet, Take 1 tablet (40 mg total) by mouth daily., Disp: 90 tablet, Rfl: 3   warfarin (COUMADIN ) 5 MG tablet, TAKE 1 TO 2 TABLETS BY MOUTH DAILY OR AS DIRECTED BY ANTICOAGULATION CLINIC., Disp: 125 tablet, Rfl: 1   Allergies  Allergen Reactions   Other     Bee sting    Sucralfate Other (See Comments)     Review of Systems  Constitutional: Negative.   Eyes:  Negative for blurred vision.  Respiratory: Negative.    Cardiovascular: Negative.  Negative for chest pain.  Endocrine: Negative.   Skin: Negative.   Allergic/Immunologic: Negative.   Neurological:  Negative for headaches.  Hematological: Negative.      Today's Vitals   12/14/23 1030  BP: 134/70  Pulse: 85  Temp: 97.7 F (36.5 C)  SpO2: 98%  Weight: 181 lb 12.8 oz (82.5 kg)  Height: 5' 8  (1.727 m)   Body mass index is 27.64 kg/m.  Wt Readings from Last 3 Encounters:  12/14/23 181 lb 12.8 oz (82.5 kg)  11/29/23 177 lb 6.4 oz (80.5 kg)  10/26/23 178 lb 9.6 oz (81 kg)     Objective:  Physical Exam Vitals and nursing note reviewed.  Constitutional:      Appearance: Normal appearance.  HENT:     Head: Normocephalic and atraumatic.     Right Ear: Ear canal and external ear normal. There is  impacted cerumen.     Left Ear: Ear canal and external ear normal. There is impacted cerumen.  Eyes:     Extraocular Movements: Extraocular movements intact.  Cardiovascular:     Rate and Rhythm: Normal rate and regular rhythm.     Heart sounds: Normal heart sounds.  Pulmonary:     Effort: Pulmonary effort is normal.     Breath sounds: Normal breath sounds.  Musculoskeletal:     Cervical back: Normal range of motion.  Skin:    General: Skin is warm.  Neurological:     General: No focal deficit present.     Mental Status: He is alert.  Psychiatric:        Mood and Affect: Mood normal.         Assessment And Plan:  Hypertensive heart and renal disease with renal failure, stage 1 through stage 4 or unspecified chronic kidney disease, with heart failure (HCC) Assessment & Plan: Chronic, fair control. Goal BP<130/80.  He is encouraged to follow a low sodium diet. He will f/u in four to six months for re-evaluation. Fluid retention managed with Lasix  and spironolactone . Blood pressure improved with spironolactone , valsartan , and amlodipine . - Continue Lasix  and spironolactone . - Continue valsartan  and amlodipine .  Orders: -     CMP14+EGFR -     Urinalysis, Complete -     Microalbumin / creatinine urine ratio  Chronic systolic heart failure (HCC) Assessment & Plan: Chronic, sx currently stable. Most recent echo reviewed, June 2025. - Restrict salt - Take meds as prescribed.   Atherosclerosis of aorta (HCC) Assessment & Plan: Chronic, LDL goal < 70.  He will continue  with atorvastatin  10mg  M-F. He is encouraged to follow heart healthy lifestyle.    Paroxysmal atrial fibrillation (HCC) Assessment & Plan: Chronic, he is properly anticoagulated and rate controlled. He is encouraged to avoid caffeinated products. He is also encouraged to stay well hydrated.    Stage 3a chronic kidney disease (HCC) Assessment & Plan: Chronic, he is encouraged to stay well hydrated, avoid NSAIDs and keep BP controlled to prevent progression of CKD.    Orders: -     PTH, intact and calcium  -     Phosphorus -     Microalbumin / creatinine urine ratio  Bilateral impacted cerumen Assessment & Plan: After obtaining verbal consent, both ears were flushed by irrigation. No TM abnormalities were noted. He tolerated procedure well without any complications.     Orders: -     Ear Lavage  Other abnormal glucose Assessment & Plan: Previous labs reviewed, his A1c has been elevated in the past. I will check an A1c today. Reminded to avoid refined sugars including sugary drinks/foods and processed meats including bacon, sausages and deli meats.    Orders: -     Hemoglobin A1c  OSA on CPAP Assessment & Plan: Chronic, he is followed by Dr. Shlomo. Her input is appreciated. He is encouraged to use PAP at least four hours per night.    Monoclonal gammopathy of unknown significance (MGUS) Assessment & Plan: Chronic, Hem/Onc input appreciated. Per Onc, no evidence of progression to MM. He is followed yearly w/ SPEP and IgG levels.    Acquired thrombophilia (HCC) Assessment & Plan: On Coumadin  with dietary restrictions affecting intake of greens. Advised to consume greens twice a week, with Coumadin  dosage adjusted accordingly. Attends a Coumadin  clinic and sees a hematologist every six months. - Consume greens twice a week. - Adjust Coumadin  dosage based on dietary  intake. - Continue regular visits to Coumadin  clinic and hematologist.   Other orders -     Microscopic  Examination   Return for 4 month bpc..  Patient was given opportunity to ask questions. Patient verbalized understanding of the plan and was able to repeat key elements of the plan. All questions were answered to their satisfaction.   I, Lawrence LOISE Slocumb, MD, have reviewed all documentation for this visit. The documentation on 12/14/23 for the exam, diagnosis, procedures, and orders are all accurate and complete.   IF YOU HAVE BEEN REFERRED TO A SPECIALIST, IT MAY TAKE 1-2 WEEKS TO SCHEDULE/PROCESS THE REFERRAL. IF YOU HAVE NOT HEARD FROM US /SPECIALIST IN TWO WEEKS, PLEASE GIVE US  A CALL AT [phone].   THE PATIENT IS ENCOURAGED TO PRACTICE SOCIAL DISTANCING DUE TO THE COVID-19 PANDEMIC.

## 2023-12-15 ENCOUNTER — Ambulatory Visit: Payer: Self-pay

## 2023-12-15 ENCOUNTER — Ambulatory Visit: Payer: No Typology Code available for payment source

## 2023-12-15 DIAGNOSIS — Z0000 Encounter for general adult medical examination without abnormal findings: Secondary | ICD-10-CM | POA: Diagnosis not present

## 2023-12-15 LAB — MICROSCOPIC EXAMINATION
Bacteria, UA: NONE SEEN
Casts: NONE SEEN /LPF
Epithelial Cells (non renal): NONE SEEN /HPF (ref 0–10)
RBC, Urine: NONE SEEN /HPF (ref 0–2)
WBC, UA: NONE SEEN /HPF (ref 0–5)

## 2023-12-15 LAB — URINALYSIS, COMPLETE
Bilirubin, UA: NEGATIVE
Glucose, UA: NEGATIVE
Ketones, UA: NEGATIVE
Leukocytes,UA: NEGATIVE
Nitrite, UA: NEGATIVE
Protein,UA: NEGATIVE
RBC, UA: NEGATIVE
Specific Gravity, UA: 1.012 (ref 1.005–1.030)
Urobilinogen, Ur: 0.2 mg/dL (ref 0.2–1.0)
pH, UA: 6 (ref 5.0–7.5)

## 2023-12-15 LAB — CMP14+EGFR
ALT: 9 IU/L (ref 0–44)
AST: 17 IU/L (ref 0–40)
Albumin: 3.8 g/dL (ref 3.7–4.7)
Alkaline Phosphatase: 59 IU/L (ref 44–121)
BUN/Creatinine Ratio: 16 (ref 10–24)
BUN: 22 mg/dL (ref 8–27)
Bilirubin Total: 0.5 mg/dL (ref 0.0–1.2)
CO2: 22 mmol/L (ref 20–29)
Calcium: 9.4 mg/dL (ref 8.6–10.2)
Chloride: 101 mmol/L (ref 96–106)
Creatinine, Ser: 1.37 mg/dL — ABNORMAL HIGH (ref 0.76–1.27)
Globulin, Total: 4.3 g/dL (ref 1.5–4.5)
Glucose: 84 mg/dL (ref 70–99)
Potassium: 4.3 mmol/L (ref 3.5–5.2)
Sodium: 136 mmol/L (ref 134–144)
Total Protein: 8.1 g/dL (ref 6.0–8.5)
eGFR: 51 mL/min/1.73 — ABNORMAL LOW (ref >59)

## 2023-12-15 LAB — MICROALBUMIN / CREATININE URINE RATIO
Creatinine, Urine: 65.2 mg/dL
Microalb/Creat Ratio: 41 mg/g{creat} — ABNORMAL HIGH (ref 0–29)
Microalbumin, Urine: 26.7 ug/mL

## 2023-12-15 LAB — HEMOGLOBIN A1C
Est. average glucose Bld gHb Est-mCnc: 126 mg/dL
Hgb A1c MFr Bld: 6 % — ABNORMAL HIGH (ref 4.8–5.6)

## 2023-12-15 LAB — PTH, INTACT AND CALCIUM: PTH: 74 pg/mL — ABNORMAL HIGH (ref 15–65)

## 2023-12-15 LAB — PHOSPHORUS: Phosphorus: 3.6 mg/dL (ref 2.8–4.1)

## 2023-12-15 NOTE — Progress Notes (Signed)
 Subjective:   Lawrence Bailey is a 83 y.o. who presents for a Medicare Wellness preventive visit.  As a reminder, Annual Wellness Visits don't include a physical exam, and some assessments may be limited, especially if this visit is performed virtually. We may recommend an in-person follow-up visit with your provider if needed.  Visit Complete: Virtual I connected with  Lawrence Bailey on 12/15/23 by a audio enabled telemedicine application and verified that I am speaking with the correct person using two identifiers.  Patient Location: Home  Provider Location: Office/Clinic  I discussed the limitations of evaluation and management by telemedicine. The patient expressed understanding and agreed to proceed.  Vital Signs: Because this visit was a virtual/telehealth visit, some criteria may be missing or patient reported. Any vitals not documented were not able to be obtained and vitals that have been documented are patient reported.  VideoError- Librarian, academic were attempted between this provider and patient, however failed, due to patient having technical difficulties OR patient did not have access to video capability.  We continued and completed visit with audio only.   Persons Participating in Visit: Patient.  AWV Questionnaire: No: Patient Medicare AWV questionnaire was not completed prior to this visit.  Cardiac Risk Factors include: advanced age (>75men, >16 women);hypertension;male gender     Objective:    Today's Vitals   12/15/23 1436  PainSc: 4    There is no height or weight on file to calculate BMI.     12/15/2023    2:42 PM 10/23/2023    3:13 PM 06/30/2023   12:03 PM 02/24/2023   11:10 AM 01/31/2023    9:27 PM 12/10/2022    2:28 PM 01/30/2022    1:29 PM  Advanced Directives  Does Patient Have a Medical Advance Directive? Yes Yes Yes Yes Yes Yes No  Type of Estate agent of [place];Living will Living will Healthcare  Power of [place];Living will Healthcare Power of [place];Living will Healthcare Power of [place];Living will Healthcare Power of [place];Living will   Does patient want to make changes to medical advance directive?   No - Patient declined No - Patient declined No - Patient declined    Copy of Healthcare Power of Attorney in Chart? No - copy requested  No - copy requested No - copy requested No - copy requested No - copy requested   Would patient like information on creating a medical advance directive?       Yes (MAU/Ambulatory/Procedural Areas - Information given)    Current Medications (verified) Outpatient Encounter Medications as of 12/15/2023  Medication Sig   amLODipine  (NORVASC ) 10 MG tablet TAKE 1 TABLET BY MOUTH EVERY DAY   atorvastatin  (LIPITOR) 10 MG tablet TAKE 1 TABLET (10 MG TOTAL) BY MOUTH SEE ADMIN INSTRUCTIONS. TAKE 1 TABLET BY MOUTH MONDAY - FRIDAY AT BEDTIME   brimonidine  (ALPHAGAN ) 0.2 % ophthalmic solution 1 drop 2 (two) times daily.   COSOPT  PF 2-0.5 % SOLN Apply 1 drop to eye 2 (two) times daily.   docusate sodium  (COLACE) 100 MG capsule Take 100 mg by mouth daily as needed for mild constipation.   famotidine  (PEPCID ) 20 MG tablet TAKE 1 TABLET BY MOUTH EVERY DAY   finasteride  (PROSCAR ) 5 MG tablet Take 5 mg by mouth daily.   fluticasone  (FLONASE ) 50 MCG/ACT nasal spray PLACE 2 SPRAYS INTO BOTH NOSTRILS SEE ADMIN INSTRUCTIONS. INHALE 2 SPRAYS INTO EACH NOSTRIL EVERY NIGHT   furosemide  (LASIX ) 20 MG tablet Take 1  tablet (20 mg total) by mouth daily.   latanoprost  (XALATAN ) 0.005 % ophthalmic solution Place 1 drop into both eyes at bedtime.   metoprolol  tartrate (LOPRESSOR ) 25 MG tablet Take 1 tablet (25 mg total) by mouth once for 1 dose. Take 2 hours prior to CTA procedure.   polyethylene glycol (MIRALAX  / GLYCOLAX ) 17 g packet Take 17 g by mouth daily. (Patient taking differently: Take 17 g by mouth as needed for moderate constipation.)   sildenafil  (REVATIO ) 20 MG  tablet Take 1 tablet (20 mg total) by mouth as needed.   spironolactone  (ALDACTONE ) 25 MG tablet Take 0.5 tablets (12.5 mg total) by mouth daily.   tamsulosin  (FLOMAX ) 0.4 MG CAPS capsule Take 0.4 mg by mouth daily.   traMADol  (ULTRAM ) 50 MG tablet Take 1 tablet (50 mg total) by mouth every 12 (twelve) hours as needed.   valsartan  (DIOVAN ) 40 MG tablet Take 1 tablet (40 mg total) by mouth daily.   warfarin (COUMADIN ) 5 MG tablet TAKE 1 TO 2 TABLETS BY MOUTH DAILY OR AS DIRECTED BY ANTICOAGULATION CLINIC.   No facility-administered encounter medications on file as of 12/15/2023.    Allergies (verified) Other and Sucralfate   History: Past Medical History:  Diagnosis Date   Allergy 1980   Bees   Anemia 2008   Arthritis    BPH (benign prostatic hyperplasia)    Cataract    Coronary artery disease    GERD (gastroesophageal reflux disease) 2000   GIST (gastrointestinal stromal tumor), malignant (HCC) dx'd 04/2007   gleevac comp 04/2008   Glaucoma    Heart murmur 1941   Irregular heartbeat   Hypertension    Myocardial infarction (HCC)    1982   OSA on CPAP    mild obstructive sleep apnea with an AHI of 9.3/h overall and moderate sleep apnea during REM sleep with REM AHI 17/h.   Oxygen deficiency    2024   Prostate cancer [facility])    Renal insufficiency    Sleep apnea 2024   Stroke [facility]) 2004   Ulcer    Past Surgical History:  Procedure Laterality Date   ABDOMINAL SURGERY     COLON SURGERY     Stomach   EYE SURGERY     HERNIA REPAIR     KNEE SURGERY     Family History  Problem Relation Age of Onset   Hypertension Mother    Cancer Mother    Arthritis Mother    Hearing loss Mother    Kidney disease Mother    Vision loss Mother    Breast cancer Neg Hx    Prostate cancer Neg Hx    Colon cancer Neg Hx    Pancreatic cancer Neg Hx    Social History   Socioeconomic History   Marital status: Married    Spouse name: Not on file   Number of children: Not on file   Years  of education: Not on file   Highest education level: 10th grade  Occupational History   Occupation: retired  Tobacco Use   Smoking status: Never   Smokeless tobacco: Never  Vaping Use   Vaping status: Never Used  Substance and Sexual Activity   Alcohol use: Not Currently   Drug use: No   Sexual activity: Yes    Birth control/protection: Pill, None  Other Topics Concern   Not on file  Social History Narrative   Not on file   Social Drivers of Health   Financial Resource Strain: Low  Risk  (12/15/2023)   Overall Financial Resource Strain (CARDIA)    Difficulty of Paying Living Expenses: Not hard at all  Food Insecurity: No Food Insecurity (12/15/2023)   Hunger Vital Sign    Worried About Running Out of Food in the Last Year: Never true    Ran Out of Food in the Last Year: Never true  Transportation Needs: No Transportation Needs (12/15/2023)   PRAPARE - Administrator, Civil Service (Medical): No    Lack of Transportation (Non-Medical): No  Physical Activity: Insufficiently Active (12/15/2023)   Exercise Vital Sign    Days of Exercise per Week: 3 days    Minutes of Exercise per Session: 30 min  Stress: No Stress Concern Present (12/15/2023)   [employer] of Occupational Health - Occupational Stress Questionnaire    Feeling of Stress: Not at all  Social Connections: Moderately Integrated (12/15/2023)   Social Connection and Isolation Panel    Frequency of Communication with Friends and Family: More than three times a week    Frequency of Social Gatherings with Friends and Family: Once a week    Attends Religious Services: More than 4 times per year    Active Member of [employer] or Organizations: No    Attends Engineer, structural: Never    Marital Status: Married    Tobacco Counseling Counseling given: Not Answered    Clinical Intake:  Pre-visit preparation completed: Yes  Pain : 0-10 Pain Score: 4  Pain Type: Chronic pain Pain Location:  Knee Pain Orientation: Left Pain Descriptors / Indicators: Aching Pain Onset: More than a month ago Pain Frequency: Constant     Nutritional Risks: None Diabetes: No  Lab Results  Component Value Date   HGBA1C 5.7 (H) 06/15/2023   HGBA1C 6.4 (H) 02/09/2023   HGBA1C 5.6 07/09/2020     How often do you need to have someone help you when you read instructions, pamphlets, or other written materials from your doctor or pharmacy?: 1 - Never  Interpreter Needed?: No  Information entered by :: NAllen LPN   Activities of Daily Living     12/15/2023    2:37 PM 01/31/2023    9:27 PM  In your present state of health, do you have any difficulty performing the following activities:  Hearing? 0 0  Vision? 0 0  Difficulty concentrating or making decisions? 0 0  Walking or climbing stairs? 0 0  Dressing or bathing? 0 0  Doing errands, shopping? 0 0  Preparing Food and eating ? N   Using the Toilet? N   In the past six months, have you accidently leaked urine? N   Do you have problems with loss of bowel control? N   Managing your Medications? N   Managing your Finances? N   Housekeeping or managing your Housekeeping? N     Patient Care Team: Jarold Medici, MD as PCP - General (Internal Medicine) Mealor, Eulas BRAVO, MD as PCP - Electrophysiology (Cardiology) Santo Stanly LABOR, MD as PCP - Cardiology (Cardiology) Gladis Cough, MD as Consulting Physician (General Surgery) Rollin Dover, MD as Consulting Physician (Gastroenterology) Levern Hutching, MD as Consulting Physician (Cardiology) Octavia Bruckner, MD as Consulting Physician (Ophthalmology) Arnell Rockney PARAS, [facility] (Inactive) (Pharmacist) Jarold Medici, MD as Referring Physician (Internal Medicine)  I have updated your Care Teams any recent Medical Services you may have received from other providers in the past year.     Assessment:   This is a routine wellness examination for  Lawrence.  Hearing/Vision  screen Hearing Screening - Comments:: Denies hearing issues Vision Screening - Comments:: Regular eye exams, [facility]   Goals Addressed             This Visit's Progress    Patient Stated       12/15/2023, remain being able to move       Depression Screen     12/15/2023    2:43 PM 12/14/2023   10:31 AM 06/15/2023    3:24 PM 12/28/2022    3:43 PM 12/10/2022    2:30 PM 12/10/2022    2:05 PM 11/20/2021    4:04 PM  PHQ 2/9 Scores  PHQ - 2 Score 0 0 0 0 0 0 0  PHQ- 9 Score 0 0 0 0 0 0     Fall Risk     12/15/2023    2:42 PM 12/14/2023   10:31 AM 06/15/2023    3:24 PM 12/28/2022    3:43 PM 12/10/2022    2:29 PM  Fall Risk   Falls in the past year? 0 0 0 0 0  Number falls in past yr: 0 0 0 0 0  Injury with Fall? 0 0 0 0 0  Risk for fall due to : Medication side effect No Fall Risks No Fall Risks No Fall Risks Medication side effect  Follow up Falls evaluation completed;Falls prevention discussed Falls evaluation completed Falls evaluation completed Falls evaluation completed Falls prevention discussed;Falls evaluation completed    MEDICARE RISK AT HOME:  Medicare Risk at Home Any stairs in or around the home?: Yes If so, are there any without handrails?: No Home free of loose throw rugs in walkways, pet beds, electrical cords, etc?: Yes Adequate lighting in your home to reduce risk of falls?: Yes Life alert?: No Use of a cane, walker or w/c?: No Grab bars in the bathroom?: Yes Shower chair or bench in shower?: Yes Elevated toilet seat or a handicapped toilet?: Yes  TIMED UP AND GO:  Was the test performed?  No  Cognitive Function: 6CIT completed        12/15/2023    2:43 PM 12/10/2022    2:30 PM 11/20/2021    4:05 PM 11/13/2020    2:19 PM 11/01/2019   12:03 PM  6CIT Screen  What Year? 0 points 0 points 0 points 0 points 0 points  What month? 0 points 0 points 3 points 0 points 0 points  What time? 0 points 0 points 0 points 0 points 0 points  Count back from 20  0 points 2 points 0 points 0 points 2 points  Months in reverse 2 points 4 points 4 points 4 points 0 points  Repeat phrase 2 points 8 points 4 points 4 points 0 points  Total Score 4 points 14 points 11 points 8 points 2 points    Immunizations Immunization History  Administered Date(s) Administered   Fluad Quad(high Dose 65+) 03/15/2019, 07/09/2020   Fluad Trivalent(High Dose 65+) 02/24/2023   PFIZER(Purple Top)SARS-COV-2 Vaccination 07/01/2019, 07/24/2019, 05/24/2020, 09/22/2020   PNEUMOCOCCAL CONJUGATE-20 07/24/2021   Tdap 03/15/2019    Screening Tests Health Maintenance  Topic Date Due   COVID-19 Vaccine (5 - 2024-25 season) 12/31/2023 (Originally 01/24/2023)   Zoster Vaccines- Shingrix (1 of 2) 03/15/2024 (Originally 03/30/1960)   INFLUENZA VACCINE  12/24/2023   Medicare Annual Wellness (AWV)  12/14/2024   DTaP/Tdap/Td (2 - Td or Tdap) 03/14/2029   Pneumococcal Vaccine: 50+ Years  Completed  Hepatitis B Vaccines  Aged Out   HPV VACCINES  Aged Out   Meningococcal B Vaccine  Aged Out   Hepatitis C Screening  Discontinued    Health Maintenance  There are no preventive care reminders to display for this patient.  Health Maintenance Items Addressed: Up to date  Additional Screening:  Vision Screening: Recommended annual ophthalmology exams for early detection of glaucoma and other disorders of the eye. Would you like a referral to an eye doctor? No    Dental Screening: Recommended annual dental exams for proper oral hygiene  Community Resource Referral / Chronic Care Management: CRR required this visit?  No   CCM required this visit?  No   Plan:    I have personally reviewed and noted the following in the patient's chart:   Medical and social history Use of alcohol, tobacco or illicit drugs  Current medications and supplements including opioid prescriptions. Patient is not currently taking opioid prescriptions. Functional ability and status Nutritional  status Physical activity Advanced directives List of other physicians Hospitalizations, surgeries, and ER visits in previous 12 months Vitals Screenings to include cognitive, depression, and falls Referrals and appointments  In addition, I have reviewed and discussed with patient certain preventive protocols, quality metrics, and best practice recommendations. A written personalized care plan for preventive services as well as general preventive health recommendations were provided to patient.   Ardella FORBES Dawn, LPN   [id number]/76/7974   After Visit Summary: (MyChart) Due to this being a telephonic visit, the after visit summary with patients personalized plan was offered to patient via MyChart   Notes: Nothing significant to report at this time.

## 2023-12-15 NOTE — Patient Instructions (Signed)
 Mr. Macon , Thank you for taking time out of your busy schedule to complete your Annual Wellness Visit with me. I enjoyed our conversation and look forward to speaking with you again next year. I, as well as your care team,  appreciate your ongoing commitment to your health goals. Please review the following plan we discussed and let me know if I can assist you in the future. Your Game plan/ To Do List    Referrals: If you haven't heard from the office you've been referred to, please reach out to them at the phone provided.  N/a Follow up Visits: Next Medicare AWV with our clinical staff: office will schedule   Have you seen your provider in the last 6 months (3 months if uncontrolled diabetes)? Yes Next Office Visit with your provider: 03/30/2024 at 12:00  Clinician Recommendations:  Aim for 30 minutes of exercise or brisk walking, 6-8 glasses of water, and 5 servings of fruits and vegetables each day.       This is a list of the screening recommended for you and due dates:  Health Maintenance  Topic Date Due   COVID-19 Vaccine (5 - 2024-25 season) 12/31/2023*   Zoster (Shingles) Vaccine (1 of 2) 03/15/2024*   Flu Shot  12/24/2023   Medicare Annual Wellness Visit  12/14/2024   DTaP/Tdap/Td vaccine (2 - Td or Tdap) 03/14/2029   Pneumococcal Vaccine for age over 10  Completed   Hepatitis B Vaccine  Aged Out   HPV Vaccine  Aged Out   Meningitis B Vaccine  Aged Out   Hepatitis C Screening  Discontinued  *Topic was postponed. The date shown is not the original due date.    Advanced directives: (Copy Requested) Please bring a copy of your health care power of attorney and living will to the office to be added to your chart at your convenience. You can mail to [facility] [address] [place], [place] [address] or email to [email or web address] Advance Care Planning is important because it:  [x]  Makes sure you receive the medical care that is consistent with your  values, goals, and preferences  [x]  It provides guidance to your family and loved ones and reduces their decisional burden about whether or not they are making the right decisions based on your wishes.  Follow the link provided in your after visit summary or read over the paperwork we have mailed to you to help you started getting your Advance Directives in place. If you need assistance in completing these, please reach out to us  so that we can help you!  See attachments for Preventive Care and Fall Prevention Tips.

## 2023-12-20 ENCOUNTER — Ambulatory Visit: Payer: Self-pay | Admitting: Internal Medicine

## 2023-12-21 DIAGNOSIS — G4733 Obstructive sleep apnea (adult) (pediatric): Secondary | ICD-10-CM | POA: Diagnosis not present

## 2023-12-22 DIAGNOSIS — R519 Headache, unspecified: Secondary | ICD-10-CM | POA: Diagnosis not present

## 2023-12-22 DIAGNOSIS — Z961 Presence of intraocular lens: Secondary | ICD-10-CM | POA: Diagnosis not present

## 2023-12-22 DIAGNOSIS — H401113 Primary open-angle glaucoma, right eye, severe stage: Secondary | ICD-10-CM | POA: Diagnosis not present

## 2023-12-22 DIAGNOSIS — H04123 Dry eye syndrome of bilateral lacrimal glands: Secondary | ICD-10-CM | POA: Diagnosis not present

## 2023-12-22 DIAGNOSIS — H401122 Primary open-angle glaucoma, left eye, moderate stage: Secondary | ICD-10-CM | POA: Diagnosis not present

## 2023-12-22 DIAGNOSIS — H2511 Age-related nuclear cataract, right eye: Secondary | ICD-10-CM | POA: Diagnosis not present

## 2023-12-22 NOTE — Assessment & Plan Note (Signed)
 Chronic, Hem/Onc input appreciated. Per Onc, no evidence of progression to MM. He is followed yearly w/ SPEP and IgG levels.

## 2023-12-22 NOTE — Assessment & Plan Note (Signed)
 Chronic, LDL goal < 70.  He will continue with atorvastatin 10mg  M-F. He is encouraged to follow heart healthy lifestyle.

## 2023-12-22 NOTE — Assessment & Plan Note (Signed)
 Chronic, he is encouraged to stay well hydrated, avoid NSAIDs and keep BP controlled to prevent progression of CKD.

## 2023-12-22 NOTE — Assessment & Plan Note (Signed)
 Chronic, he is properly anticoagulated and rate controlled. He is encouraged to avoid caffeinated products. He is also encouraged to stay well hydrated.

## 2023-12-22 NOTE — Assessment & Plan Note (Signed)
 On Coumadin  with dietary restrictions affecting intake of greens. Advised to consume greens twice a week, with Coumadin  dosage adjusted accordingly. Attends a Coumadin  clinic and sees a hematologist every six months. - Consume greens twice a week. - Adjust Coumadin  dosage based on dietary intake. - Continue regular visits to Coumadin  clinic and hematologist.

## 2023-12-22 NOTE — Assessment & Plan Note (Signed)
 After obtaining verbal consent, both ears were flushed by irrigation. No TM abnormalities were noted. He tolerated procedure well without any complications.

## 2023-12-22 NOTE — Assessment & Plan Note (Addendum)
 Chronic, fair control. Goal BP<130/80.  He is encouraged to follow a low sodium diet. He will f/u in four to six months for re-evaluation. Fluid retention managed with Lasix  and spironolactone . Blood pressure improved with spironolactone , valsartan , and amlodipine . - Continue Lasix  and spironolactone . - Continue valsartan  and amlodipine .

## 2023-12-22 NOTE — Assessment & Plan Note (Addendum)
 Chronic, sx currently stable. Most recent echo reviewed, June 2025. - Restrict salt - Take meds as prescribed.

## 2023-12-22 NOTE — Assessment & Plan Note (Signed)
 Chronic, he is followed by Dr. Mayford Knife. Her input is appreciated. He is encouraged to use PAP at least four hours per night.

## 2023-12-22 NOTE — Assessment & Plan Note (Signed)
 Previous labs reviewed, his A1c has been elevated in the past. I will check an A1c today. Reminded to avoid refined sugars including sugary drinks/foods and processed meats including bacon, sausages and deli meats.

## 2023-12-28 ENCOUNTER — Telehealth: Payer: Self-pay | Admitting: Oncology

## 2023-12-28 ENCOUNTER — Inpatient Hospital Stay: Payer: No Typology Code available for payment source | Attending: Oncology

## 2023-12-28 ENCOUNTER — Inpatient Hospital Stay (HOSPITAL_BASED_OUTPATIENT_CLINIC_OR_DEPARTMENT_OTHER): Payer: No Typology Code available for payment source | Admitting: Oncology

## 2023-12-28 VITALS — BP 128/81 | HR 68 | Temp 98.1°F | Resp 18 | Ht 68.0 in | Wt 178.1 lb

## 2023-12-28 DIAGNOSIS — Z8509 Personal history of malignant neoplasm of other digestive organs: Secondary | ICD-10-CM | POA: Insufficient documentation

## 2023-12-28 DIAGNOSIS — I3139 Other pericardial effusion (noninflammatory): Secondary | ICD-10-CM | POA: Diagnosis not present

## 2023-12-28 DIAGNOSIS — D472 Monoclonal gammopathy: Secondary | ICD-10-CM | POA: Insufficient documentation

## 2023-12-28 DIAGNOSIS — N189 Chronic kidney disease, unspecified: Secondary | ICD-10-CM | POA: Insufficient documentation

## 2023-12-28 DIAGNOSIS — M549 Dorsalgia, unspecified: Secondary | ICD-10-CM | POA: Insufficient documentation

## 2023-12-28 LAB — CBC WITH DIFFERENTIAL (CANCER CENTER ONLY)
Abs Immature Granulocytes: 0.01 K/uL (ref 0.00–0.07)
Basophils Absolute: 0 K/uL (ref 0.0–0.1)
Basophils Relative: 0 %
Eosinophils Absolute: 0 K/uL (ref 0.0–0.5)
Eosinophils Relative: 1 %
HCT: 34.2 % — ABNORMAL LOW (ref 39.0–52.0)
Hemoglobin: 10.6 g/dL — ABNORMAL LOW (ref 13.0–17.0)
Immature Granulocytes: 0 %
Lymphocytes Relative: 23 %
Lymphs Abs: 1.1 K/uL (ref 0.7–4.0)
MCH: 26.9 pg (ref 26.0–34.0)
MCHC: 31 g/dL (ref 30.0–36.0)
MCV: 86.8 fL (ref 80.0–100.0)
Monocytes Absolute: 0.3 K/uL (ref 0.1–1.0)
Monocytes Relative: 7 %
Neutro Abs: 3.1 K/uL (ref 1.7–7.7)
Neutrophils Relative %: 69 %
Platelet Count: 128 K/uL — ABNORMAL LOW (ref 150–400)
RBC: 3.94 MIL/uL — ABNORMAL LOW (ref 4.22–5.81)
RDW: 15.2 % (ref 11.5–15.5)
WBC Count: 4.6 K/uL (ref 4.0–10.5)
nRBC: 0 % (ref 0.0–0.2)

## 2023-12-28 LAB — CMP (CANCER CENTER ONLY)
ALT: 12 U/L (ref 0–44)
AST: 22 U/L (ref 15–41)
Albumin: 3.7 g/dL (ref 3.5–5.0)
Alkaline Phosphatase: 54 U/L (ref 38–126)
Anion gap: 6 (ref 5–15)
BUN: 18 mg/dL (ref 8–23)
CO2: 26 mmol/L (ref 22–32)
Calcium: 9.4 mg/dL (ref 8.9–10.3)
Chloride: 104 mmol/L (ref 98–111)
Creatinine: 1.32 mg/dL — ABNORMAL HIGH (ref 0.61–1.24)
GFR, Estimated: 54 mL/min — ABNORMAL LOW (ref >60)
Glucose, Bld: 76 mg/dL (ref 70–99)
Potassium: 4.2 mmol/L (ref 3.5–5.1)
Sodium: 136 mmol/L (ref 135–145)
Total Bilirubin: 0.8 mg/dL (ref 0.0–1.2)
Total Protein: 8.2 g/dL — ABNORMAL HIGH (ref 6.5–8.1)
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
## 2023-12-28 NOTE — Telephone Encounter (Signed)
 Confirm appt time

## 2023-12-29 LAB — IGG: IgG (Immunoglobin G), Serum: 3721 mg/dL — ABNORMAL HIGH (ref 603–1613)

## 2023-12-29 LAB — KAPPA/LAMBDA LIGHT CHAINS
Kappa free light chain: 26.1 mg/L — ABNORMAL HIGH (ref 3.3–19.4)
Kappa, lambda light chain ratio: 1.14 (ref 0.26–1.65)
Lambda free light chains: 22.9 mg/L (ref 5.7–26.3)

## 2023-12-30 LAB — PROTEIN ELECTROPHORESIS, SERUM
A/G Ratio: 0.8 (ref 0.7–1.7)
Albumin ELP: 3.7 g/dL (ref 2.9–4.4)
Alpha-1-Globulin: 0.2 g/dL (ref 0.0–0.4)
Alpha-2-Globulin: 0.5 g/dL (ref 0.4–1.0)
Beta Globulin: 0.7 g/dL (ref 0.7–1.3)
Gamma Globulin: 3 g/dL — ABNORMAL HIGH (ref 0.4–1.8)
Globulin, Total: 4.4 g/dL — ABNORMAL HIGH (ref 2.2–3.9)
M-Spike, %: 2.4 g/dL — ABNORMAL HIGH
Total Protein ELP: 8.1 g/dL (ref 6.0–8.5)

## 2024-01-06 ENCOUNTER — Encounter (HOSPITAL_COMMUNITY): Payer: Self-pay

## 2024-01-13 ENCOUNTER — Telehealth: Payer: Self-pay | Admitting: Student

## 2024-01-13 NOTE — Telephone Encounter (Signed)
 Pt calling in asking why does he needs an echo done. Ordered by Loistine, NP

## 2024-01-13 NOTE — Telephone Encounter (Signed)
 Patient did not understand what an echocardiogram was for and advised that it is done to monitor his heart pumping function and look for any abnormalities. He verbalized understanding and would like to move ahead with getting that scheduled. Advised that I would have our scheduling team reach out to assist with getting that done. He requested to have it done in [place] and advised that I would let them know. He verbalized understanding and all questions were answered.

## 2024-01-13 NOTE — Telephone Encounter (Signed)
 I informed him he has already had an echo this year. He states someone called stating they needed some type of test, wife states they mentioned an echo in [place]. Please advise if pt needs this done again or if there is anything else he needs done.

## 2024-01-14 NOTE — Telephone Encounter (Signed)
 Left voicemail to schedule Echo in [place], please schedule

## 2024-01-19 ENCOUNTER — Encounter: Payer: Self-pay | Admitting: Internal Medicine

## 2024-01-25 ENCOUNTER — Ambulatory Visit: Attending: Cardiology

## 2024-01-25 DIAGNOSIS — I480 Paroxysmal atrial fibrillation: Secondary | ICD-10-CM

## 2024-01-25 DIAGNOSIS — D6869 Other thrombophilia: Secondary | ICD-10-CM | POA: Diagnosis not present

## 2024-01-25 LAB — POCT INR: INR: 1.9 — AB (ref 2.0–3.0)

## 2024-01-25 NOTE — Patient Instructions (Signed)
 Description   INR 1.9 Take 1.5 tablets today, then resume same dosage of Warfarin 5mg  (1 tablet) daily except 10mg  (2 tablets) on Mondays, Wednesdays and Fridays  Remain consistent with greens each week (2 serving per week).  Recheck in 5 weeks.  Coumadin  Clinic [phone]

## 2024-01-25 NOTE — Progress Notes (Signed)
 Description   INR 1.9 Take 1.5 tablets today, then resume same dosage of Warfarin 5mg  (1 tablet) daily except 10mg  (2 tablets) on Mondays, Wednesdays and Fridays  Remain consistent with greens each week (2 serving per week).  Recheck in 5 weeks.  Coumadin  Clinic [phone]

## 2024-01-31 ENCOUNTER — Encounter: Payer: Self-pay | Admitting: Internal Medicine

## 2024-01-31 ENCOUNTER — Telehealth: Payer: Self-pay | Admitting: Student

## 2024-01-31 NOTE — Telephone Encounter (Signed)
 Called pt and left a voice mail to schedule in echo appointment in [employer]

## 2024-01-31 NOTE — Telephone Encounter (Signed)
 Called pt to schedule his echo in [employer]

## 2024-02-01 ENCOUNTER — Encounter: Payer: Self-pay | Admitting: Internal Medicine

## 2024-02-01 ENCOUNTER — Encounter: Payer: Self-pay | Admitting: Orthopedic Surgery

## 2024-02-02 NOTE — Telephone Encounter (Signed)
 Called pt and scheduled him for a consultation on 9/19

## 2024-02-04 ENCOUNTER — Ambulatory Visit: Admitting: Orthopedic Surgery

## 2024-02-08 ENCOUNTER — Encounter: Payer: Self-pay | Admitting: Internal Medicine

## 2024-02-08 ENCOUNTER — Other Ambulatory Visit (HOSPITAL_COMMUNITY): Payer: Self-pay

## 2024-02-08 ENCOUNTER — Telehealth: Payer: Self-pay | Admitting: Pharmacy Technician

## 2024-02-08 ENCOUNTER — Other Ambulatory Visit: Payer: Self-pay | Admitting: Internal Medicine

## 2024-02-08 MED ORDER — FINASTERIDE 5 MG PO TABS: 5.0000 mg | ORAL_TABLET | Freq: Every day | ORAL | 0 refills | Status: AC

## 2024-02-08 NOTE — Telephone Encounter (Signed)
 Patient Advocate Encounter   The patient was approved for a [employer] grant that will help cover the cost of Eliquis  Total amount awarded, 7500.00.  Effective: 01/09/24 - 01/07/25   APW:[id number] ERW:[id number] Hmnle:[id number] PI:[id number]  [employer] ID: [id number]   Pharmacy provided with approval and processing information. Patient informed via mychart

## 2024-02-10 ENCOUNTER — Other Ambulatory Visit: Payer: Self-pay | Admitting: Internal Medicine

## 2024-02-10 DIAGNOSIS — G8929 Other chronic pain: Secondary | ICD-10-CM

## 2024-02-11 ENCOUNTER — Ambulatory Visit: Admitting: Surgical

## 2024-02-11 ENCOUNTER — Other Ambulatory Visit (INDEPENDENT_AMBULATORY_CARE_PROVIDER_SITE_OTHER): Payer: Self-pay

## 2024-02-11 ENCOUNTER — Encounter: Payer: Self-pay | Admitting: Surgical

## 2024-02-11 DIAGNOSIS — G8929 Other chronic pain: Secondary | ICD-10-CM

## 2024-02-11 DIAGNOSIS — M25562 Pain in left knee: Secondary | ICD-10-CM

## 2024-02-11 DIAGNOSIS — M25561 Pain in right knee: Secondary | ICD-10-CM

## 2024-02-11 DIAGNOSIS — M170 Bilateral primary osteoarthritis of knee: Secondary | ICD-10-CM

## 2024-02-11 NOTE — Progress Notes (Signed)
 Office Visit Note   Patient: Lawrence Bailey           Date of Birth: 1941-04-12           MRN: [id number] Visit Date: 02/11/2024 Requested by: Jarold Medici, MD [address] [place],  [place] [address] PCP: Jarold Medici, MD  Subjective: Chief Complaint  Patient presents with   Right Knee - Pain   Left Knee - Pain    HPI: Lawrence Bailey is a 83 y.o. male who presents to the office reporting pain.  Patient has history of bilateral knee osteoarthritis.  Has had prior gel injections and would like to repeat these.  He describes long history of knee pain dating back to when he played high school football.  Left knee bothers him more than his right on average.  Takes occasional tramadol .  Left knee will occasionally giving out on him.  No history of knee surgery.  No recent injury or fall.  No consistent groin pain.  Has had prior cortisone injections but these do not really help him at all.  Wants to avoid surgery..                ROS: All systems reviewed are negative as they relate to the chief complaint within the history of present illness.  Patient denies fevers or chills.  Assessment & Plan: Visit Diagnoses:  1. Primary osteoarthritis of both knees   2. Bilateral chronic knee pain     Plan: Plan at this time is preapprove bilateral gel injections that have done well for Tahjir in the past.  We will see him back after approval.  Cortisone injections have not provided any relief for him.  This patient is diagnosed with osteoarthritis of the knee(s).    Radiographs show evidence of joint space narrowing, osteophytes, subchondral sclerosis and/or subchondral cysts.  This patient has knee pain which interferes with functional and activities of daily living.    This patient has experienced inadequate response, adverse effects and/or intolerance with conservative treatments such as acetaminophen , NSAIDS, topical creams, physical therapy or regular exercise, knee bracing  and/or weight loss.   This patient has experienced inadequate response or has a contraindication to intra articular steroid injections for at least 3 months.   This patient is not scheduled to have a total knee replacement within 6 months of starting treatment with viscosupplementation.   Follow-Up Instructions: No follow-ups on file.   Orders:  Orders Placed This Encounter  Procedures   XR KNEE 3 VIEW LEFT   XR KNEE 3 VIEW RIGHT   No orders of the defined types were placed in this encounter.     Procedures: No procedures performed   Clinical Data: No additional findings.  Objective: Vital Signs: There were no vitals taken for this visit.  Physical Exam:  Constitutional: Patient appears well-developed HEENT:  Head: Normocephalic Eyes:EOM are normal Neck: Normal range of motion Cardiovascular: Normal rate Pulmonary/chest: Effort normal Neurologic: Patient is alert Skin: Skin is warm Psychiatric: Patient has normal mood and affect  Ortho Exam: Ortho exam demonstrates left knee with 3 degrees extension and 115 degrees of knee flexion.  Large effusion present.  Tender over the medial and lateral joint lines.  ACL laxity is present primarily in flexion.  Right knee with 3 degrees extension 105 degrees of knee flexion.  Small effusion is present relative to the contralateral side.  Tender over the medial joint line but not the lateral joint line.  No pain with hip range of motion.  Stable to anterior and posterior drawer sign in the right knee.  No cellulitis or skin changes noted.  Palpable PT pulse bilaterally.  Specialty Comments:  No specialty comments available.  Imaging: No results found.   PMFS History: Patient Active Problem List   Diagnosis Date Noted   Secondary hypercoagulable state (HCC) 06/23/2023   Other abnormal glucose 06/15/2023   OSA on CPAP    Nephrolithiasis 02/22/2023   Normocytic anemia 01/31/2023   Acute lower UTI (urinary tract infection)  01/31/2023   Moderate protein malnutrition (HCC) 01/31/2023   Hyponatremia 01/31/2023   Hypokalemia 01/31/2023   Thrombocytopenia (HCC) 01/31/2023   Hyperlipidemia 01/31/2023   Systolic heart failure (HCC) 01/31/2023   Generalized abdominal pain 12/30/2022   Early satiety 12/30/2022   Gross hematuria 12/30/2022   Acute cystitis with hematuria 12/30/2022   Anemia 12/30/2022   Left temporal headache 12/22/2022   Close exposure to COVID-19 virus 12/22/2022   BMI 26.0-26.9,adult 12/22/2022   Paroxysmal atrial fibrillation (HCC) 09/10/2022   Acquired thrombophilia (HCC) 09/10/2022   Falling episodes 12/12/2021   Bilateral impacted cerumen 07/27/2021   Paresthesia of both feet 07/27/2021   Fall (on) (from) other stairs and steps, initial encounter 07/27/2021   Recurrent falls 07/27/2021   Monoclonal gammopathy of unknown significance (MGUS) 07/27/2021   Chest pain 08/28/2020   Constipation 08/28/2020   Cough 08/28/2020   Dysphagia 08/28/2020   Hemorrhage of rectum and anus 08/28/2020   Atherosclerosis of aorta (HCC) 07/09/2020   Hypertensive heart and renal disease 05/02/2020   Malignant neoplasm of prostate (HCC) 07/11/2019   Glaucoma 02/28/2018   Syncope and collapse    Syncope 11/08/2016   Stage 3 chronic kidney disease (HCC) 11/08/2016   Essential hypertension 11/08/2016   Hyperglycemia 11/08/2016   Pulmonary contusion 02/04/2012   Thigh hematoma 02/04/2012   H/O malignant gastrointestinal stromal tumor (GIST) 07/20/2011   Past Medical History:  Diagnosis Date   Allergy 1980   Bees   Anemia 2008   Arthritis    BPH (benign prostatic hyperplasia)    Cataract    Coronary artery disease    GERD (gastroesophageal reflux disease) 2000   GIST (gastrointestinal stromal tumor), malignant (HCC) dx'd 04/2007   gleevac comp 04/2008   Glaucoma    Heart murmur 1941   Irregular heartbeat   Hypertension    Myocardial infarction (HCC)    1982   OSA on CPAP    mild obstructive  sleep apnea with an AHI of 9.3/h overall and moderate sleep apnea during REM sleep with REM AHI 17/h.   Oxygen deficiency    2024   Prostate cancer [facility])    Renal insufficiency    Sleep apnea 2024   Stroke [facility]) 2004   Ulcer     Family History  Problem Relation Age of Onset   Hypertension Mother    Cancer Mother    Arthritis Mother    Hearing loss Mother    Kidney disease Mother    Vision loss Mother    Breast cancer Neg Hx    Prostate cancer Neg Hx    Colon cancer Neg Hx    Pancreatic cancer Neg Hx     Past Surgical History:  Procedure Laterality Date   ABDOMINAL SURGERY     COLON SURGERY     Stomach   EYE SURGERY     HERNIA REPAIR     KNEE SURGERY     Social History  Occupational History   Occupation: retired  Tobacco Use   Smoking status: Never   Smokeless tobacco: Never  Vaping Use   Vaping status: Never Used  Substance and Sexual Activity   Alcohol use: Not Currently   Drug use: No   Sexual activity: Yes    Birth control/protection: Pill, None

## 2024-02-18 ENCOUNTER — Ambulatory Visit: Admitting: Orthopedic Surgery

## 2024-02-21 ENCOUNTER — Telehealth: Payer: Self-pay

## 2024-02-21 NOTE — Telephone Encounter (Signed)
 The patient's schedule has been updated to reflect the provider's new office hours. A reminder letter has been sent with the revised appointment time.

## 2024-02-22 NOTE — Progress Notes (Unsigned)
 Cardiology Office Note:  .    Date:  02/22/2024  ID:  Lawrence Bailey, DOB March 24, 1941, MRN [id number] PCP: Jarold Medici, MD  [facility] Providers Cardiologist:  Stanly DELENA Leavens, MD Electrophysiologist:  Eulas FORBES Furbish, MD { Click to update primary MD,subspecialty MD or APP then REFRESH:1}    CC: *** Consulted for the evaluation of *** at the behest of ***   History of Present Illness: .    Lawrence Bailey is a 83 y.o. male *** 2024: Transitioned his care to me 2025: transitioned to Eliquis .  Discussed the use of AI scribe software for clinical note transcription with the patient, who gave verbal consent to proceed.  male with a hx of aortic atherosclerosis, CAD, AF with prior stroke, ane CKD who presents to transition his care.   HF     Relevant histories: .  Social *** ROS: As per HPI.   Studies Reviewed: .     Cardiac Studies & Procedures   ______________________________________________________________________________________________   STRESS TESTS  MYOCARDIAL PERFUSION IMAGING 10/27/2023  Interpretation Summary   Stress ECG is nondiagnostic, pharmacological stress, did not achieve 85% of maximum predicted heart rate.   Small sized, mild intensity, fixed perfusion defect of the basal anteroseptal segment, likely artifact.   There is no obvious evidence of reversible ischemia or infarction.   Left ventricular function is low normal. Nuclear stress EF: 48%. End diastolic cavity size is enlarged (EDV 150 cc).   Coronary calcium  was present on the attenuation correction CT images. Mild coronary calcifications were present.  Moderate to large size pericardial effusion, circumferential, predominantly posterior location (correlate with echocardiography).   Prior study 07/06/2013 reported as low risk, no reversible ischemia or infarction, normal wall motion, calculated LVEF 61%   Low to intermediate risk due to low normal LVEF, global hypokinesis, and  dilated LV cavity.   ECHOCARDIOGRAM  ECHOCARDIOGRAM COMPLETE 10/26/2023  Narrative ECHOCARDIOGRAM REPORT    Patient Name:   Lawrence Bailey Date of Exam: 10/26/2023 Medical Rec #:  [id number]       Height:       68.0 in Accession #:    [id number]      Weight:       177.9 lb Date of Birth:  [birth year]       BSA:          1.945 m Patient Age:    82 years        BP:           138/85 mmHg Patient Gender: M               HR:           59 bpm. Exam Location:  [facility]  Procedure: 2D Echo, 3D Echo, Cardiac Doppler, Color Doppler and Strain Analysis (Both Spectral and Color Flow Doppler were utilized during procedure).  Indications:    I31.3 Pericardial effusion (noninflammatory)  History:        Patient has prior history of Echocardiogram examinations, most recent 02/01/2023. Arrythmias:Atrial Fibrillation, Signs/Symptoms:Chest Pain and Syncope; Risk Factors:Hypertension and Dyslipidemia.  Sonographer:    Doyal Point MHA, BS, RDCS Referring Phys: [id number] [facility] A Taiesha Bovard  IMPRESSIONS   1. Left ventricular ejection fraction, by estimation, is 40 to 45%. Left ventricular ejection fraction by 3D volume is 31 %. Left ventricular ejection fraction by PLAX is 38 %. The left ventricle has mild to moderately decreased function. The left ventricle demonstrates global hypokinesis. There is mild left ventricular  hypertrophy. Left ventricular diastolic parameters are indeterminate. The average left ventricular global longitudinal strain is -14.8 %. The global longitudinal strain is abnormal. 2. Right ventricular systolic function is normal. The right ventricular size is normal. 3. Left atrial size was mildly dilated. 4. Right atrial size was mildly dilated. 5. Moderate-large pericardial effusion. The pericardial effusion is circumferential. There is no evidence of cardiac tamponade. 6. The mitral valve is normal in structure. Mild to moderate mitral valve regurgitation. 7. The aortic  valve is tricuspid. Aortic valve regurgitation is mild to moderate. Aortic valve sclerosis/calcification is present, without any evidence of aortic stenosis.  FINDINGS Left Ventricle: Left ventricular ejection fraction, by estimation, is 40 to 45%. Left ventricular ejection fraction by PLAX is 38 %. Left ventricular ejection fraction by 3D volume is 31 %. The left ventricle has mild to moderately decreased function. The left ventricle demonstrates global hypokinesis. The average left ventricular global longitudinal strain is -14.8 %. Strain was performed and the global longitudinal strain is abnormal. The left ventricular internal cavity size was normal in size. There is mild left ventricular hypertrophy. Left ventricular diastolic parameters are indeterminate.  Right Ventricle: The right ventricular size is normal. No increase in right ventricular wall thickness. Right ventricular systolic function is normal.  Left Atrium: Left atrial size was mildly dilated.  Right Atrium: Right atrial size was mildly dilated.  Pericardium: Moderate-large pericardial effusion. The pericardial effusion is circumferential. There is no evidence of cardiac tamponade.  Mitral Valve: The mitral valve is normal in structure. Mild to moderate mitral valve regurgitation.  Tricuspid Valve: The tricuspid valve is normal in structure. Tricuspid valve regurgitation is mild.  Aortic Valve: The aortic valve is tricuspid. Aortic valve regurgitation is mild to moderate. Aortic valve sclerosis/calcification is present, without any evidence of aortic stenosis. Aortic valve mean gradient measures 4.0 mmHg. Aortic valve peak gradient measures 8.3 mmHg. Aortic valve area, by VTI measures 2.36 cm.  Pulmonic Valve: The pulmonic valve was normal in structure. Pulmonic valve regurgitation is mild.  Aorta: The aortic root is normal in size and structure.  IAS/Shunts: No atrial level shunt detected by color flow  Doppler.  Additional Comments: 3D was performed not requiring image post processing on an independent workstation and was abnormal.   LEFT VENTRICLE PLAX 2D LV EF:         Left ventricular     2D Longitudinal ejection        Strain fraction by     2D Strain GLS   -14.8 % PLAX is 38      Avg: %. LVIDd:         5.39 cm         3D Volume EF LVIDs:         4.39 cm         LV 3D EF:    Left LV PW:         1.22 cm                      ventricul LV IVS:        1.23 cm                      ar LVOT diam:     2.20 cm                      ejection LV SV:         72  fraction LV SV Index:   37                           by 3D LVOT Area:     3.80 cm                     volume is 31 %.  3D Volume EF: 3D EF:        31 % LV EDV:       176 ml LV ESV:       122 ml LV SV:        54 ml  RIGHT VENTRICLE RV Basal diam:  3.83 cm RV Mid diam:    2.80 cm RV S prime:     18.50 cm/s TAPSE (M-mode): 2.9 cm  LEFT ATRIUM             Index        RIGHT ATRIUM           Index LA diam:        4.40 cm 2.26 cm/m   RA Area:     23.50 cm LA Vol (A2C):   76.9 ml 39.55 ml/m  RA Volume:   68.50 ml  35.23 ml/m LA Vol (A4C):   56.1 ml 28.85 ml/m LA Biplane Vol: 70.6 ml 36.31 ml/m AORTIC VALVE AV Area (Vmax):    2.42 cm AV Area (Vmean):   2.33 cm AV Area (VTI):     2.36 cm AV Vmax:           144.00 cm/s AV Vmean:          95.500 cm/s AV VTI:            0.305 m AV Peak Grad:      8.3 mmHg AV Mean Grad:      4.0 mmHg LVOT Vmax:         91.60 cm/s LVOT Vmean:        58.600 cm/s LVOT VTI:          0.189 m LVOT/AV VTI ratio: 0.62  AORTA Ao Sinus diam: 3.10 cm Ao Asc diam:   3.40 cm  MITRAL VALVE MV Area (PHT): 5.13 cm     SHUNTS MV Decel Time: 148 msec     Systemic VTI:  0.19 m MV E velocity: 125.00 cm/s  Systemic Diam: 2.20 cm MV A velocity: 40.00 cm/s MV E/A ratio:  3.12  Redell Cave MD Electronically signed by Redell Cave MD Signature Date/Time:  10/26/2023/1:36:09 PM    Final    MONITORS  LONG TERM MONITOR (3-14 DAYS) 03/03/2022  Narrative   Basic rhythm is NSR with 1 degree AVB average 66 bpm   16 non sustained VT episodes , longest 7 beats at 152 bpm   Atrial fib ( burden 2%) with ave HR 67 bpm. Longest episode ~ 2 hours   PVC burden 3.7%   One PVC episode associated with triggered event.   Patch Wear Time:  13 days and 21 hours (2023-09-17T13:12:26-0400 to 2023-10-01T11:10:54-0400)  Patient had a min HR of 29 bpm, max HR of 152 bpm, and avg HR of 66 bpm. Predominant underlying rhythm was Sinus Rhythm. First Degree AV Block was present. QRS morphology changes were present throughout recording. 16 Ventricular Tachycardia runs occurred, the run with the fastest interval lasting 6 beats with a max rate of 152 bpm, the longest lasting 7 beats with an avg rate of  102 bpm. Atrial Fibrillation occurred (2% burden), ranging from 44-144 bpm (avg of 67 bpm), the longest lasting 1 hour 58 mins with an avg rate of 65 bpm. Idioventricular Rhythm was present. Second Degree AV Block-Mobitz I (Wenckebach) was present. Isolated SVEs were rare (<1.0%), SVE Couplets were rare (<1.0%), and SVE Triplets were rare (<1.0%). Isolated VEs were occasional (2.4%, 30704), VE Couplets were occasional (1.3%, 8531), and VE Triplets were rare (<1.0%, 946). Ventricular Bigeminy and Trigeminy were present.       ______________________________________________________________________________________________        Risk Assessment/Calculations:    {Does this patient have ATRIAL FIBRILLATION?:[id number]}  {This patient may be at risk for Amyloid. He has one or more dx on the prob list or PMH from the following list -  Abnormal EKG, HFpEF/Diastolic CHF, Aortic Stenosis, LVH, Bilateral Carpal Tunnel Syndrome, Biceps Tendon Rupture, Spinal Stenosis, Pericardial Effusion, Left Atrial Enlargement, Conduction System Disorder. See list below or review  PMH.  Diagnoses From Problem List           Noted     Monoclonal gammopathy of unknown significance (MGUS) 07/27/2021     Paroxysmal atrial fibrillation (HCC) 09/10/2022    Click HERE to open Cardiac Amyloid Screening SmartSet to order screening OR Click HERE to defer testing for 1 year or permanently :1}    Physical Exam:    VS:  There were no vitals taken for this visit.   Wt Readings from Last 3 Encounters:  12/28/23 178 lb 1.6 oz (80.8 kg)  12/14/23 181 lb 12.8 oz (82.5 kg)  11/29/23 177 lb 6.4 oz (80.5 kg)    Gen: *** distress, *** obese/well nourished/malnourished   Neck: No JVD, *** carotid bruit Ears: *** Frank Sign Cardiac: No Rubs or Gallops, *** Murmur, ***cardia, *** radial pulses Respiratory: Clear to auscultation bilaterally, *** effort, ***  respiratory rate GI: Soft, nontender, non-distended *** MS: No *** edema; *** moves all extremities Integument: Skin feels *** Neuro:  At time of evaluation, alert and oriented to person/place/time/situation *** Psych: Normal affect, patient feels ***   ASSESSMENT AND PLAN: .    *** An EKG was ordered for *** and shows ***  Stanly Leavens, MD FASE [facility] Cardiologist [facility]  [address] [place], [place] [address] [phone]  8:40 AM

## 2024-02-23 ENCOUNTER — Ambulatory Visit: Attending: Internal Medicine | Admitting: Internal Medicine

## 2024-02-23 VITALS — BP 127/80 | HR 67 | Ht 68.0 in | Wt 179.0 lb

## 2024-02-23 DIAGNOSIS — I480 Paroxysmal atrial fibrillation: Secondary | ICD-10-CM | POA: Diagnosis not present

## 2024-02-23 DIAGNOSIS — I493 Ventricular premature depolarization: Secondary | ICD-10-CM | POA: Diagnosis not present

## 2024-02-23 DIAGNOSIS — I700 Atherosclerosis of aorta: Secondary | ICD-10-CM

## 2024-02-23 DIAGNOSIS — I5022 Chronic systolic (congestive) heart failure: Secondary | ICD-10-CM | POA: Diagnosis not present

## 2024-02-23 DIAGNOSIS — N1831 Chronic kidney disease, stage 3a: Secondary | ICD-10-CM | POA: Diagnosis not present

## 2024-02-23 MED ORDER — METOPROLOL SUCCINATE ER 25 MG PO TB24: 25.0000 mg | ORAL_TABLET | Freq: Every day | ORAL | 3 refills | Status: AC

## 2024-02-23 NOTE — Patient Instructions (Signed)
 Medication Instructions:  - START metoprolol  succinate (Toprol  XL) 25mg  once daily.  *If you need a refill on your cardiac medications before your next appointment, please call your pharmacy*  Lab Work: - None ordered  Testing/Procedures: - None ordered  Follow-Up: At [facility], you and your health needs are our priority.  As part of our continuing mission to provide you with exceptional heart care, our providers are all part of one team.  This team includes your primary Cardiologist (physician) and Advanced Practice Providers or APPs (Physician Assistants and Nurse Practitioners) who all work together to provide you with the care you need, when you need it.  Your next appointment:   6 month(s)  Provider:   One of our Advanced Practice Providers (APPs): Morse Clause, PA-C  Lamarr Satterfield, NP Miriam Shams, NP  Olivia Pavy, PA-C Josefa Beauvais, NP  Leontine Salen, PA-C Orren Fabry, PA-C  Hao Meng, PA-C Ernest Dick, NP  Damien Braver, NP Jon Hails, PA-C  Waddell Donath, PA-C    Dayna Dunn, PA-C  Scott Weaver, PA-C Lum Louis, NP Katlyn West, NP Callie Goodrich, PA-C  Xika Zhao, NP Sheng Haley, PA-C    Kathleen Hennes, PA-C       We recommend signing up for the patient portal called MyChart.  Sign up information is provided on this After Visit Summary.  MyChart is used to connect with patients for Virtual Visits (Telemedicine).  Patients are able to view lab/test results, encounter notes, upcoming appointments, etc.  Non-urgent messages can be sent to your provider as well.   To learn more about what you can do with MyChart, go to [email or web address].

## 2024-02-25 DIAGNOSIS — E663 Overweight: Secondary | ICD-10-CM | POA: Diagnosis not present

## 2024-02-25 DIAGNOSIS — D472 Monoclonal gammopathy: Secondary | ICD-10-CM | POA: Diagnosis not present

## 2024-02-25 DIAGNOSIS — K219 Gastro-esophageal reflux disease without esophagitis: Secondary | ICD-10-CM | POA: Diagnosis not present

## 2024-02-25 DIAGNOSIS — N1831 Chronic kidney disease, stage 3a: Secondary | ICD-10-CM | POA: Diagnosis not present

## 2024-02-25 DIAGNOSIS — Z008 Encounter for other general examination: Secondary | ICD-10-CM | POA: Diagnosis not present

## 2024-02-25 DIAGNOSIS — R7303 Prediabetes: Secondary | ICD-10-CM | POA: Diagnosis not present

## 2024-02-25 DIAGNOSIS — Z6827 Body mass index (BMI) 27.0-27.9, adult: Secondary | ICD-10-CM | POA: Diagnosis not present

## 2024-02-25 DIAGNOSIS — E785 Hyperlipidemia, unspecified: Secondary | ICD-10-CM | POA: Diagnosis not present

## 2024-02-25 DIAGNOSIS — D649 Anemia, unspecified: Secondary | ICD-10-CM | POA: Diagnosis not present

## 2024-02-25 DIAGNOSIS — Z8546 Personal history of malignant neoplasm of prostate: Secondary | ICD-10-CM | POA: Diagnosis not present

## 2024-02-25 DIAGNOSIS — C61 Malignant neoplasm of prostate: Secondary | ICD-10-CM | POA: Diagnosis not present

## 2024-02-25 DIAGNOSIS — I69351 Hemiplegia and hemiparesis following cerebral infarction affecting right dominant side: Secondary | ICD-10-CM | POA: Diagnosis not present

## 2024-02-25 DIAGNOSIS — R269 Unspecified abnormalities of gait and mobility: Secondary | ICD-10-CM | POA: Diagnosis not present

## 2024-02-25 DIAGNOSIS — Z85028 Personal history of other malignant neoplasm of stomach: Secondary | ICD-10-CM | POA: Diagnosis not present

## 2024-02-25 DIAGNOSIS — I129 Hypertensive chronic kidney disease with stage 1 through stage 4 chronic kidney disease, or unspecified chronic kidney disease: Secondary | ICD-10-CM | POA: Diagnosis not present

## 2024-02-25 DIAGNOSIS — I2510 Atherosclerotic heart disease of native coronary artery without angina pectoris: Secondary | ICD-10-CM | POA: Diagnosis not present

## 2024-02-29 ENCOUNTER — Ambulatory Visit

## 2024-03-03 ENCOUNTER — Telehealth: Payer: Self-pay | Admitting: Internal Medicine

## 2024-03-03 NOTE — Telephone Encounter (Signed)
 Patient returned staff call and wants to know if he still needs to have the CT CORONARY MORPH W/CTA COR W/SCORE test.

## 2024-03-06 NOTE — Telephone Encounter (Signed)
 Called advised pt and spouse of MD response: That is correct unless he's changed his mind he declined testing   Order for Cardiac CT has been canceled pt should not receive any further calls to schedule testing.

## 2024-03-06 NOTE — Addendum Note (Signed)
 Addended by: RANDY HAMP SAILOR on: 03/06/2024 06:18 PM   Modules accepted: Orders

## 2024-03-27 ENCOUNTER — Encounter: Payer: Self-pay | Admitting: Radiology

## 2024-03-29 ENCOUNTER — Emergency Department (HOSPITAL_COMMUNITY)

## 2024-03-29 ENCOUNTER — Encounter (HOSPITAL_COMMUNITY): Payer: Self-pay

## 2024-03-29 ENCOUNTER — Emergency Department (HOSPITAL_COMMUNITY)
Admission: EM | Admit: 2024-03-29 | Discharge: 2024-03-29 | Disposition: A | Discharge status: Home / Self Care | Attending: Emergency Medicine | Admitting: Emergency Medicine

## 2024-03-29 ENCOUNTER — Other Ambulatory Visit: Payer: Self-pay

## 2024-03-29 DIAGNOSIS — R059 Cough, unspecified: Secondary | ICD-10-CM | POA: Diagnosis not present

## 2024-03-29 DIAGNOSIS — R062 Wheezing: Secondary | ICD-10-CM | POA: Insufficient documentation

## 2024-03-29 DIAGNOSIS — Z7901 Long term (current) use of anticoagulants: Secondary | ICD-10-CM | POA: Diagnosis not present

## 2024-03-29 DIAGNOSIS — E871 Hypo-osmolality and hyponatremia: Secondary | ICD-10-CM | POA: Diagnosis not present

## 2024-03-29 DIAGNOSIS — I129 Hypertensive chronic kidney disease with stage 1 through stage 4 chronic kidney disease, or unspecified chronic kidney disease: Secondary | ICD-10-CM | POA: Diagnosis not present

## 2024-03-29 DIAGNOSIS — X500XXA Overexertion from strenuous movement or load, initial encounter: Secondary | ICD-10-CM | POA: Insufficient documentation

## 2024-03-29 DIAGNOSIS — G8929 Other chronic pain: Secondary | ICD-10-CM | POA: Diagnosis not present

## 2024-03-29 DIAGNOSIS — D696 Thrombocytopenia, unspecified: Secondary | ICD-10-CM | POA: Diagnosis not present

## 2024-03-29 DIAGNOSIS — M545 Low back pain, unspecified: Secondary | ICD-10-CM | POA: Diagnosis present

## 2024-03-29 DIAGNOSIS — Z79899 Other long term (current) drug therapy: Secondary | ICD-10-CM | POA: Insufficient documentation

## 2024-03-29 DIAGNOSIS — Z8546 Personal history of malignant neoplasm of prostate: Secondary | ICD-10-CM | POA: Diagnosis not present

## 2024-03-29 DIAGNOSIS — N189 Chronic kidney disease, unspecified: Secondary | ICD-10-CM | POA: Diagnosis not present

## 2024-03-29 DIAGNOSIS — I2510 Atherosclerotic heart disease of native coronary artery without angina pectoris: Secondary | ICD-10-CM | POA: Diagnosis not present

## 2024-03-29 DIAGNOSIS — R109 Unspecified abdominal pain: Secondary | ICD-10-CM | POA: Diagnosis not present

## 2024-03-29 DIAGNOSIS — D649 Anemia, unspecified: Secondary | ICD-10-CM | POA: Insufficient documentation

## 2024-03-29 LAB — CBC
HCT: 38.4 % — ABNORMAL LOW (ref 39.0–52.0)
Hemoglobin: 11.5 g/dL — ABNORMAL LOW (ref 13.0–17.0)
MCH: 26.6 pg (ref 26.0–34.0)
MCHC: 29.9 g/dL — ABNORMAL LOW (ref 30.0–36.0)
MCV: 88.9 fL (ref 80.0–100.0)
Platelets: 123 K/uL — ABNORMAL LOW (ref 150–400)
RBC: 4.32 MIL/uL (ref 4.22–5.81)
RDW: 14.2 % (ref 11.5–15.5)
WBC: 6.5 K/uL (ref 4.0–10.5)
nRBC: 0 % (ref 0.0–0.2)

## 2024-03-29 LAB — COMPREHENSIVE METABOLIC PANEL WITH GFR
ALT: 9 U/L (ref 0–44)
AST: 18 U/L (ref 15–41)
Albumin: 3.9 g/dL (ref 3.5–5.0)
Alkaline Phosphatase: 62 U/L (ref 38–126)
Anion gap: 5 (ref 5–15)
BUN: 23 mg/dL (ref 8–23)
CO2: 27 mmol/L (ref 22–32)
Calcium: 9.7 mg/dL (ref 8.9–10.3)
Chloride: 102 mmol/L (ref 98–111)
Creatinine, Ser: 1.21 mg/dL (ref 0.61–1.24)
GFR, Estimated: 59 mL/min — ABNORMAL LOW (ref >60)
Glucose, Bld: 108 mg/dL — ABNORMAL HIGH (ref 70–99)
Potassium: 4.6 mmol/L (ref 3.5–5.1)
Sodium: 133 mmol/L — ABNORMAL LOW (ref 135–145)
Total Bilirubin: 1.1 mg/dL (ref 0.0–1.2)
Total Protein: 9.1 g/dL — ABNORMAL HIGH (ref 6.5–8.1)

## 2024-03-29 LAB — LIPASE, BLOOD: Lipase: 20 U/L (ref 11–51)

## 2024-03-29 MED ORDER — MORPHINE SULFATE (PF) 4 MG/ML IV SOLN
4.0000 mg | Freq: Once | INTRAVENOUS | Status: AC
  Administered 2024-03-29: 4 mg via INTRAVENOUS
  Filled 2024-03-29: qty 1

## 2024-03-29 MED ORDER — PREDNISONE 10 MG (21) PO TBPK: ORAL_TABLET | Freq: Every day | ORAL | 0 refills | Status: AC

## 2024-03-29 MED ORDER — OXYCODONE-ACETAMINOPHEN 5-325 MG PO TABS: 1.0000 | ORAL_TABLET | Freq: Four times a day (QID) | ORAL | 0 refills | Status: AC | PRN

## 2024-03-29 MED ORDER — IOHEXOL 300 MG/ML  SOLN
100.0000 mL | Freq: Once | INTRAMUSCULAR | Status: AC | PRN
  Administered 2024-03-29: 100 mL via INTRAVENOUS

## 2024-03-29 NOTE — ED Triage Notes (Signed)
 Pt reports lifting something 2 weeks ago and is having lower back and abdominal pain. Pt reports wheezing at night.

## 2024-03-29 NOTE — Discharge Instructions (Addendum)
 Please use Tylenol  for pain.  You may use 1000 mg of Tylenol  every 6 hours.  Not to exceed 4 g of Tylenol  within 24 hours.  You can use the stronger narcotic pain medication in place of Tylenol  for severe break through pain.  If you take the narcotic pain medication that we prescribed recommend that you also take a laxative such as MiraLAX  or Dulcolax every day that you take the narcotic pain medicine, and drink plenty of fluids, 50 to 64 ounces to prevent any constipation.  Your workup overall was stable, with no evidence of metastatic cancer, no spine fracture, however there was several bulging disks in your lumbar spine, which are likely contributing to your pain.  Please follow-up with the neurosurgeon whose contact information provided above.

## 2024-03-29 NOTE — ED Provider Notes (Signed)
 [place] EMERGENCY DEPARTMENT AT [facility] Provider Note   CSN: [id number] Arrival date & time: 03/29/24  9160     Patient presents with: Back Pain   Lawrence Bailey is a 83 y.o. male with past medical history seen for hypertension, history of gastrointestinal stromal tumor reportedly in remission, CKD, glaucoma, paroxysmal A-fib, prostate cancer, hyperlipidemia, CAD who presents with concern for severe back pain intermittently for 2 weeks, significantly worsened yesterday.  Patient reports that he was doing some somewhat heavy lifting, did not seem to improve with at home tramadol , denies any new injury, loss control bladder, bowels, denies fever, chills, new numbness or weakness.  Reports that the pain is mostly left-sided, radiating towards central abdomen.  Denies any nausea, vomiting, constipation.  Family member also endorses some rattling cough especially after climbing the stairs, patient denies any shortness of breath, denies chest pain.    Back Pain      Prior to Admission medications   Medication Sig Start Date End Date Taking? Authorizing Provider  oxyCODONE-acetaminophen  (PERCOCET/ROXICET) 5-325 MG tablet Take 1 tablet by mouth every 6 (six) hours as needed for severe pain (pain score 7-10). 03/29/24  Yes Mase Dhondt H, PA-C  predniSONE (STERAPRED UNI-PAK 21 TAB) 10 MG (21) TBPK tablet Take by mouth daily. Take 6 tabs by mouth daily  for 2 days, then 5 tabs for 2 days, then 4 tabs for 2 days, then 3 tabs for 2 days, 2 tabs for 2 days, then 1 tab by mouth daily for 2 days 03/29/24  Yes Mychele Seyller H, PA-C  amLODipine  (NORVASC ) 10 MG tablet TAKE 1 TABLET BY MOUTH EVERY DAY 11/18/23   Jarold Medici, MD  atorvastatin  (LIPITOR) 10 MG tablet TAKE 1 TABLET (10 MG TOTAL) BY MOUTH SEE ADMIN INSTRUCTIONS. TAKE 1 TABLET BY MOUTH MONDAY - FRIDAY AT BEDTIME 12/06/23   Jarold Medici, MD  brimonidine  (ALPHAGAN ) 0.2 % ophthalmic solution 1 drop 2 (two) times daily.  04/25/19   [provider]  COSOPT  PF 2-0.5 % SOLN Apply 1 drop to eye 2 (two) times daily. 04/02/23   [provider]  docusate sodium  (COLACE) 100 MG capsule Take 100 mg by mouth daily as needed for mild constipation.    [provider]  ELIQUIS  5 MG TABS tablet Take 5 mg by mouth 2 (two) times daily. 02/15/24   [provider]  famotidine  (PEPCID ) 20 MG tablet TAKE 1 TABLET BY MOUTH EVERY DAY 06/21/23   Jarold Medici, MD  finasteride  (PROSCAR ) 5 MG tablet Take 1 tablet (5 mg total) by mouth daily. 02/08/24   Jarold Medici, MD  fluticasone  (FLONASE ) 50 MCG/ACT nasal spray PLACE 2 SPRAYS INTO BOTH NOSTRILS SEE ADMIN INSTRUCTIONS. INHALE 2 SPRAYS INTO EACH NOSTRIL EVERY NIGHT 11/04/23   Jarold Medici, MD  furosemide  (LASIX ) 20 MG tablet Take 1 tablet (20 mg total) by mouth daily. 10/26/23 02/23/24  Loistine Sober, NP  latanoprost  (XALATAN ) 0.005 % ophthalmic solution Place 1 drop into both eyes at bedtime.    [provider]  metoprolol  succinate (TOPROL  XL) 25 MG 24 hr tablet Take 1 tablet (25 mg total) by mouth daily. 02/23/24   Chandrasekhar, Stanly A, MD  polyethylene glycol (MIRALAX  / GLYCOLAX ) 17 g packet Take 17 g by mouth daily. 02/03/23   Rosario Leatrice FERNS, MD  sildenafil  (REVATIO ) 20 MG tablet Take 1 tablet (20 mg total) by mouth as needed. 06/21/23   Jarold Medici, MD  spironolactone  (ALDACTONE ) 25 MG tablet Take 0.5  tablets (12.5 mg total) by mouth daily. 10/26/23 02/23/24  Loistine Sober, NP  tadalafil (CIALIS) 5 MG tablet as needed for erectile dysfunction. 02/08/24   [provider]  tamsulosin  (FLOMAX ) 0.4 MG CAPS capsule Take 0.4 mg by mouth daily.    [provider]  traMADol  (ULTRAM ) 50 MG tablet Take 1 tablet (50 mg total) by mouth every 12 (twelve) hours as needed. 06/01/23 05/31/24  Jarold Medici, MD  valsartan  (DIOVAN ) 40 MG tablet Take 1 tablet (40 mg total) by mouth daily. 04/07/23   Santo Stanly LABOR, MD     Allergies: Other and Sucralfate    Review of Systems  Musculoskeletal:  Positive for back pain.  All other systems reviewed and are negative.   Updated Vital Signs BP (!) 156/88 (BP Location: Left Arm)   Pulse 65   Temp 97.8 F (36.6 C) (Oral)   Resp 17   SpO2 100%   Physical Exam Vitals and nursing note reviewed.  Constitutional:      General: He is not in acute distress.    Appearance: Normal appearance.  HENT:     Head: Normocephalic and atraumatic.  Eyes:     General:        Right eye: No discharge.        Left eye: No discharge.  Cardiovascular:     Rate and Rhythm: Normal rate and regular rhythm.     Heart sounds: No murmur heard.    No friction rub. No gallop.  Pulmonary:     Effort: Pulmonary effort is normal.     Breath sounds: Normal breath sounds.     Comments: No wheezing, rhonchi, stridor, rales Abdominal:     General: Bowel sounds are normal.     Palpations: Abdomen is soft.     Comments: Some mild tenderness of the left upper quadrant radiating towards the left flank, no rebound, rigidity, guarding throughout  Musculoskeletal:     Comments: Focal tenderness in left thoracic, lumbar spine, no step-off, deformity.  Skin:    General: Skin is warm and dry.     Capillary Refill: Capillary refill takes less than 2 seconds.  Neurological:     Mental Status: He is alert and oriented to person, place, and time.  Psychiatric:        Mood and Affect: Mood normal.        Behavior: Behavior normal.     (all labs ordered are listed, but only abnormal results are displayed) Labs Reviewed  CBC - Abnormal; Notable for the following components:      Result Value   Hemoglobin 11.5 (*)    HCT 38.4 (*)    MCHC 29.9 (*)    Platelets 123 (*)    All other components within normal limits  COMPREHENSIVE METABOLIC PANEL WITH GFR - Abnormal; Notable for the following components:   Sodium 133 (*)    Glucose, Bld 108 (*)    Total Protein 9.1 (*)    GFR,  Estimated 59 (*)    All other components within normal limits  LIPASE, BLOOD    EKG: None  Radiology: CT L-SPINE NO CHARGE Result Date: 03/29/2024 EXAM: CT OF THE LUMBAR SPINE WITHOUT CONTRAST 03/29/2024 11:08:00 AM TECHNIQUE: CT of the lumbar spine was performed without the administration of intravenous contrast. Multiplanar reformatted images are provided for review. Automated exposure control, iterative reconstruction, and/or weight based adjustment of the mA/kV was utilized to reduce the radiation dose to as low as reasonably achievable. COMPARISON:  None available. CLINICAL HISTORY: FINDINGS: BONES AND ALIGNMENT: 5 non rib bearing lumbar vertebral bodies are present. Normal vertebral body heights. No acute fracture or suspicious bone lesion. Straightening of the normal lumbar lordosis is present. Mild leftward curvature is present at L4-L5. DEGENERATIVE CHANGES: At L3-L4, a Schmorl node and a broad based disc protrusion are present. Mild foraminal narrowing is worse on the right than on the left. At L4-L5, a broad based disc protrusion is present. Moderate foraminal narrowing is worse on the left. At L5-S1, chronic loss of disc height is present. Facet spuring is present bilaterally. Mild left foraminal stenosis is present. SOFT TISSUES: No acute abnormality. Please see dedicated report of CT chest, abdomen and pelvis of the same day for nonspinal findings. IMPRESSION: 1. Broad-based disc protrusion at L4-L5 with moderate foraminal narrowing, worse on the left. 2. Broad-based disc protrusion at L3-L4 with mild foraminal narrowing, worse on the right. 3. Chronic loss of disc height at L5-S1 with mild left foraminal stenosis and bilateral facet spurring. 4. Straightening of the normal lumbar lordosis with mild leftward curvature at L4-L5. Electronically signed by: Lonni Necessary MD 03/29/2024 12:18 PM EST RP Workstation: [id number]   CT T-SPINE NO CHARGE Result Date: 03/29/2024 EXAM: CT THORACIC  SPINE WITHOUT CONTRAST 03/29/2024 11:08:00 AM TECHNIQUE: CT of the thoracic spine was performed without the administration of intravenous contrast. Multiplanar reformatted images are provided for review. Automated exposure control, iterative reconstruction, and/or weight based adjustment of the mA/kV was utilized to reduce the radiation dose to as low as reasonably achievable. COMPARISON: None available. CLINICAL HISTORY: low back pain after lifting FINDINGS: BONES AND ALIGNMENT: 12 rib bearing thoracic type vertebral bodies are present. Normal vertebral body heights. No acute fracture or suspicious bone lesion. Mild rightward curvature of the thoracic spine is centered at T7. Alignment is otherwise normal. DEGENERATIVE CHANGES: Endplate Schmorl nodes are present at T9-T10 and T10-T11. No significant thoracic spinal stenosis is present. SOFT TISSUES: No acute abnormality. Please see dedicated CT of the chest, abdomen and pelvis from today for non spinal findings. IMPRESSION: 1. No acute abnormality of the thoracic spine. 2. Mild rightward curvature of the thoracic spine centered at T7. Electronically signed by: Lonni Necessary MD 03/29/2024 12:03 PM EST RP Workstation: [id number]   CT CHEST ABDOMEN PELVIS W CONTRAST Result Date: 03/29/2024 CLINICAL DATA:  Abdominal pain and back pain. EXAM: CT CHEST, ABDOMEN, AND PELVIS WITH CONTRAST TECHNIQUE: Multidetector CT imaging of the chest, abdomen and pelvis was performed following the standard protocol during bolus administration of intravenous contrast. RADIATION DOSE REDUCTION: This exam was performed according to the departmental dose-optimization program which includes automated exposure control, adjustment of the mA and/or kV according to patient size and/or use of iterative reconstruction technique. CONTRAST:  OMNIPAQUE  IOHEXOL  300 MG/ML  SOLN COMPARISON:  08/23/2023, 11/09/2016 FINDINGS: CT CHEST FINDINGS Cardiovascular: Mild stable cardiomegaly with  stable small to moderate size pericardial effusion. Thoracic aorta is normal caliber. Minimal calcified plaque over the descending thoracic aorta. Pulmonary arterial system is unremarkable. Remaining vascular structures are unremarkable. Mediastinum/Nodes: No mediastinal or hilar adenopathy. Remaining mediastinal structures are unremarkable. Lungs/Pleura: Lungs are adequately inflated. No acute airspace process or effusion. Mild dependent bibasilar atelectasis. 3 mm subpleural nodule over the right upper lobe unchanged from 2018 and therefore benign. No other concerning nodules or masses. Airways are normal. Musculoskeletal: No focal abnormality. CT ABDOMEN PELVIS FINDINGS Hepatobiliary: Liver, gallbladder and biliary tree are normal. Pancreas: Normal. Spleen: Normal. Adrenals/Urinary Tract: Right adrenal gland difficult to  visualize. Left adrenal gland is normal. Kidneys are normal in size with multiple bilateral renal cysts with the largest over the mid to upper pole right kidney measuring 7.5 cm. Punctate nonobstructing stone over the upper pole left kidney unchanged. No hydronephrosis. Ureters and bladder are normal. Stomach/Bowel: Stomach and small bowel are normal. Appendix is normal. Colon is unremarkable as there is mild fecal retention throughout the colon. Vascular/Lymphatic: Mild calcified plaque over the abdominal aorta which is normal in caliber. Remaining vascular structures are unremarkable. No significant adenopathy. Reproductive: Mild stable prostatic enlargement. Other: No free fluid or focal inflammatory change. Small bilateral inguinal hernias containing only peritoneal fat unchanged. Musculoskeletal: No focal abnormality. IMPRESSION: 1. No acute findings in the chest, abdomen or pelvis. 2. Mild stable cardiomegaly with stable small to moderate size pericardial effusion. 3. Multiple bilateral renal cysts with the largest over the mid to upper pole right kidney measuring 7.5 cm. Punctate  nonobstructing left renal stone. 4. Mild stable prostatic enlargement. 5. Small bilateral inguinal hernias containing only peritoneal fat unchanged. 6. Aortic atherosclerosis. Aortic Atherosclerosis (ICD10-I70.0). Electronically Signed   By: Toribio Agreste M.D.   On: 03/29/2024 11:53   DG Chest Portable 1 View Result Date: 03/29/2024 CLINICAL DATA:  Lifting injury 2 weeks ago with persistent low back and abdominal pain. Shortness of breath and wheezing at night. EXAM: PORTABLE CHEST 1 VIEW COMPARISON:  10/23/2023 FINDINGS: Lungs are adequately inflated. No hazy density over the left base/retrocardiac region likely due to superimposed overlying soft tissues and pericardiac fat, although mild airspace process in the left base is possible. Right lung is clear. Mild stable cardiomegaly. Remainder of the exam is unchanged. IMPRESSION: 1. Hazy density over the left base/retrocardiac region likely due to superimposed overlying soft tissues/pericardiac fat, although mild airspace process in the left base is possible. Consider follow-up PA and lateral chest x-ray for better evaluation of the left base. 2. Stable cardiomegaly. Electronically Signed   By: Toribio Agreste M.D.   On: 03/29/2024 09:46     Procedures   Medications Ordered in the ED  morphine  (PF) 4 MG/ML injection 4 mg (4 mg Intravenous Given 03/29/24 0952)  iohexol  (OMNIPAQUE ) 300 MG/ML solution 100 mL (100 mLs Intravenous Contrast Given 03/29/24 1047)                                    Medical Decision Making Amount and/or Complexity of Data Reviewed Labs: ordered. Radiology: ordered.  Risk Prescription drug management.   This patient is a 83 y.o. male  who presents to the ED for concern of lower back pain, flank pain, intermittent wheezing.   Differential diagnoses prior to evaluation: The emergent differential diagnosis includes, but is not limited to,  AAA, renal vascular thrombosis, mesenteric ischemia, pyelonephritis,  nephrolithiasis, cystitis, biliary colic, pancreatitis, PUD, appendicitis, diverticulitis, bowel obstruction --suspect musculoskeletal back pain related to bulging disc, sciatica nerve impingement, compression fracture, but given his history of cancer considered but a metastasis, versus other.  He is not endorsing any shortness of breath, but he has had some intermittent rattling respirations per family member, continued pneumonia, viral infection, COPD, asthma, versus other. This is not an exhaustive differential.   Past Medical History / Co-morbidities / Social History: hypertension, history of gastrointestinal stromal tumor reportedly in remission, CKD, glaucoma, paroxysmal A-fib, prostate cancer, hyperlipidemia, CAD  Physical Exam: Physical exam performed. The pertinent findings include: Vital signs stable other than mild  hypertension, pressure 156/88.  Left-sided flank, and thoracic/lumbar paraspinous muscle pain, no significant midline spinal tenderness.  Intact strength 5/5 bilateral upper and lower extremities.  Patient can ambulate without significant difficulty.  Lab Tests/Imaging studies: I personally interpreted labs/imaging and the pertinent results include: CBC notable for mild anemia, hemoglobin 1.5, mild thrombocytopenia, platelets 123, stable to slightly improved from baseline.  CMP notable for hyponatremia, sodium 133, otherwise overall unremarkable.  Normal lipase.  Independently interpreted CT chest abdomen pelvis with contrast which shows bilateral renal cysts, stable small pericardial effusion, no ureteral stones, no other intra-abdominal finding to explain his symptoms today.  CT of L and T-spine shows bulging disks in the lumbar spinal region, slight rightward curvature of the thoracic spine, no bony metastasis or other abnormality. I agree with the radiologist interpretation.  Cardiac monitoring: EKG obtained and interpreted by myself and attending physician which shows: Normal  sinus rhythm with ventricular   Medications: I ordered medication including morphine  for pain, patient feeling improved after morphine , given his chronic back pain, no other acute abnormality identified today will plan to discharge with steroid taper, will change his tramadol  to Percocet, encouraged neurosurgical follow-up..  I have reviewed the patients home medicines and have made adjustments as needed.   Disposition: After consideration of the diagnostic results and the patients response to treatment, I feel that patient stable for discharge with plan as above.   emergency department workup does not suggest an emergent condition requiring admission or immediate intervention beyond what has been performed at this time. The plan is: as above. The patient is safe for discharge and has been instructed to return immediately for worsening symptoms, change in symptoms or any other concerns.   Final diagnoses:  Chronic left-sided low back pain without sciatica    ED Discharge Orders          Ordered    predniSONE (STERAPRED UNI-PAK 21 TAB) 10 MG (21) TBPK tablet  Daily        03/29/24 1241    oxyCODONE-acetaminophen  (PERCOCET/ROXICET) 5-325 MG tablet  Every 6 hours PRN        03/29/24 1241               Derryck Shahan, [place], PA-C 03/29/24 1253    Suzette Pac, MD 03/31/24 [phone]

## 2024-03-30 ENCOUNTER — Encounter: Payer: Self-pay | Admitting: Internal Medicine

## 2024-03-30 ENCOUNTER — Ambulatory Visit (INDEPENDENT_AMBULATORY_CARE_PROVIDER_SITE_OTHER): Payer: Self-pay | Admitting: Internal Medicine

## 2024-03-30 VITALS — BP 118/80 | HR 73 | Temp 98.3°F | Ht 68.0 in | Wt 184.2 lb

## 2024-03-30 DIAGNOSIS — M48061 Spinal stenosis, lumbar region without neurogenic claudication: Secondary | ICD-10-CM

## 2024-03-30 DIAGNOSIS — I700 Atherosclerosis of aorta: Secondary | ICD-10-CM

## 2024-03-30 DIAGNOSIS — I130 Hypertensive heart and chronic kidney disease with heart failure and stage 1 through stage 4 chronic kidney disease, or unspecified chronic kidney disease: Secondary | ICD-10-CM | POA: Diagnosis not present

## 2024-03-30 DIAGNOSIS — I493 Ventricular premature depolarization: Secondary | ICD-10-CM | POA: Diagnosis not present

## 2024-03-30 DIAGNOSIS — Z79899 Other long term (current) drug therapy: Secondary | ICD-10-CM

## 2024-03-30 DIAGNOSIS — R7309 Other abnormal glucose: Secondary | ICD-10-CM

## 2024-03-30 DIAGNOSIS — C61 Malignant neoplasm of prostate: Secondary | ICD-10-CM

## 2024-03-30 DIAGNOSIS — N1831 Chronic kidney disease, stage 3a: Secondary | ICD-10-CM | POA: Diagnosis not present

## 2024-03-30 MED ORDER — FAMOTIDINE 20 MG PO TABS
20.0000 mg | ORAL_TABLET | Freq: Every day | ORAL | 1 refills | Status: AC
Start: 2024-03-30 — End: ?

## 2024-03-30 NOTE — Progress Notes (Signed)
 I,Lawrence Bailey, CMA,acting as a neurosurgeon for Lawrence LOISE Slocumb, MD.,have documented all relevant documentation on the behalf of Lawrence LOISE Slocumb, MD,as directed by  Lawrence LOISE Slocumb, MD while in the presence of Lawrence LOISE Slocumb, MD.  Subjective:  Patient ID: Lawrence Bailey , male    DOB: 1940-12-02 , 83 y.o.   MRN: [id number]  Chief Complaint  Patient presents with   Hypertension    He presents today for BP check.  He reports compliance with meds. He denies headaches, chest pain and shortness of breath. Patient doesn't have any questions or concerns at this time. He reports going to ED yesterday for extreme back pain. He was given oxycodone & prednisone to take. Today he states his back feels sore but not as bad as it was yesterday.    HPI Discussed the use of AI scribe software for clinical note transcription with the patient, who gave verbal consent to proceed.  History of Present Illness Lawrence Bailey is an 83 year old male with hypertension who presents for a blood pressure check and follow-up after an ER visit for back pain.  He experienced severe back pain starting approximately two weeks ago after moving items to a storage house, which he believes may have strained his back. The pain intensified, affecting his sleep, leading to an ER visit. In the ER, he was diagnosed with chronic left-sided low back pain without sciatica and was prescribed prednisone and Percocet. His back pain has improved but remains sore.  He has a history of coronary artery disease and chronic heart failure. He saw a cardiologist on October 1st, who suggested a heart monitor, but he declined due to concerns about a procedure that might slow his heart rate. He recalls having an irregular heartbeat since a young age and mentions a past discussion about a pacemaker, which he never received.  He also has stage three chronic kidney disease. His cholesterol levels were noted to be perfect in January, and he is currently  on several medications, including amlodipine  10 mg, Eliquis  5 mg twice a day, atorvastatin , Proscar , spironolactone , and valsartan .  He reports generalized pain, including stomach pain, during his ER visit. A CT scan of his thoracic and lumbar spine showed bulging discs but no acute abnormalities. He has not been seeing a specialist for his back issues but recalls a past consultation where surgery was suggested, which he is hesitant to pursue.   Hypertension This is a chronic problem. The current episode started more than 1 year ago. The problem has been gradually improving since onset. The problem is controlled. Pertinent negatives include no blurred vision, chest pain or headaches. Risk factors for coronary artery disease include male gender. The current treatment provides moderate improvement. Hypertensive end-organ damage includes kidney disease.     Past Medical History:  Diagnosis Date   Allergy 1980   Bees   Anemia 2008   Arthritis    BPH (benign prostatic hyperplasia)    Cataract    Coronary artery disease    GERD (gastroesophageal reflux disease) 2000   GIST (gastrointestinal stromal tumor), malignant (HCC) dx'd 04/2007   gleevac comp 04/2008   Glaucoma    Heart murmur 1941   Irregular heartbeat   Hypertension    Myocardial infarction (HCC)    1982   OSA on CPAP    mild obstructive sleep apnea with an AHI of 9.3/h overall and moderate sleep apnea during REM sleep with REM AHI 17/h.   Oxygen deficiency  2024   Prostate cancer [facility])    Renal insufficiency    Sleep apnea 2024   Stroke [facility]) 2004   Ulcer      Family History  Problem Relation Age of Onset   Hypertension Mother    Cancer Mother    Arthritis Mother    Hearing loss Mother    Kidney disease Mother    Vision loss Mother    Breast cancer Neg Hx    Prostate cancer Neg Hx    Colon cancer Neg Hx    Pancreatic cancer Neg Hx      Current Outpatient Medications:    amLODipine  (NORVASC ) 10 MG tablet,  TAKE 1 TABLET BY MOUTH EVERY DAY, Disp: 90 tablet, Rfl: 3   atorvastatin  (LIPITOR) 10 MG tablet, TAKE 1 TABLET (10 MG TOTAL) BY MOUTH SEE ADMIN INSTRUCTIONS. TAKE 1 TABLET BY MOUTH MONDAY - FRIDAY AT BEDTIME, Disp: 90 tablet, Rfl: 1   brimonidine  (ALPHAGAN ) 0.2 % ophthalmic solution, 1 drop 2 (two) times daily., Disp: , Rfl:    COSOPT  PF 2-0.5 % SOLN, Apply 1 drop to eye 2 (two) times daily., Disp: , Rfl:    docusate sodium  (COLACE) 100 MG capsule, Take 100 mg by mouth daily as needed for mild constipation., Disp: , Rfl:    ELIQUIS  5 MG TABS tablet, Take 5 mg by mouth 2 (two) times daily., Disp: , Rfl:    finasteride  (PROSCAR ) 5 MG tablet, Take 1 tablet (5 mg total) by mouth daily., Disp: 90 tablet, Rfl: 0   fluticasone  (FLONASE ) 50 MCG/ACT nasal spray, PLACE 2 SPRAYS INTO BOTH NOSTRILS SEE ADMIN INSTRUCTIONS. INHALE 2 SPRAYS INTO EACH NOSTRIL EVERY NIGHT, Disp: 48 mL, Rfl: 1   furosemide  (LASIX ) 20 MG tablet, Take 1 tablet (20 mg total) by mouth daily., Disp: 90 tablet, Rfl: 3   latanoprost  (XALATAN ) 0.005 % ophthalmic solution, Place 1 drop into both eyes at bedtime., Disp: , Rfl:    metoprolol  succinate (TOPROL  XL) 25 MG 24 hr tablet, Take 1 tablet (25 mg total) by mouth daily., Disp: 90 tablet, Rfl: 3   oxyCODONE-acetaminophen  (PERCOCET/ROXICET) 5-325 MG tablet, Take 1 tablet by mouth every 6 (six) hours as needed for severe pain (pain score 7-10)., Disp: 15 tablet, Rfl: 0   polyethylene glycol (MIRALAX  / GLYCOLAX ) 17 g packet, Take 17 g by mouth daily., Disp: 14 each, Rfl: 0   predniSONE (STERAPRED UNI-PAK 21 TAB) 10 MG (21) TBPK tablet, Take by mouth daily. Take 6 tabs by mouth daily  for 2 days, then 5 tabs for 2 days, then 4 tabs for 2 days, then 3 tabs for 2 days, 2 tabs for 2 days, then 1 tab by mouth daily for 2 days, Disp: 42 tablet, Rfl: 0   sildenafil  (REVATIO ) 20 MG tablet, Take 1 tablet (20 mg total) by mouth as needed., Disp: 10 tablet, Rfl: 0   spironolactone  (ALDACTONE ) 25 MG  tablet, Take 0.5 tablets (12.5 mg total) by mouth daily., Disp: 45 tablet, Rfl: 3   tadalafil (CIALIS) 5 MG tablet, as needed for erectile dysfunction., Disp: , Rfl:    tamsulosin  (FLOMAX ) 0.4 MG CAPS capsule, Take 0.4 mg by mouth daily., Disp: , Rfl:    traMADol  (ULTRAM ) 50 MG tablet, Take 1 tablet (50 mg total) by mouth every 12 (twelve) hours as needed., Disp: 20 tablet, Rfl: 0   valsartan  (DIOVAN ) 40 MG tablet, Take 1 tablet (40 mg total) by mouth daily., Disp: 90 tablet, Rfl: 3   famotidine  (PEPCID )  20 MG tablet, Take 1 tablet (20 mg total) by mouth daily., Disp: 90 tablet, Rfl: 1   Allergies  Allergen Reactions   Other     Bee sting    Sucralfate Other (See Comments)     Review of Systems  Constitutional: Negative.   Eyes:  Negative for blurred vision.  Respiratory: Negative.    Cardiovascular: Negative.  Negative for chest pain.  Gastrointestinal: Negative.   Musculoskeletal:  Positive for back pain.  Skin: Negative.   Allergic/Immunologic: Negative.   Neurological:  Negative for headaches.  Hematological: Negative.      Today's Vitals   03/30/24 1211  BP: 118/80  Pulse: 73  Temp: 98.3 F (36.8 C)  SpO2: 98%  Weight: 184 lb 3.2 oz (83.6 kg)  Height: 5' 8 (1.727 m)   Body mass index is 28.01 kg/m.  Wt Readings from Last 3 Encounters:  03/30/24 184 lb 3.2 oz (83.6 kg)  02/23/24 179 lb (81.2 kg)  12/28/23 178 lb 1.6 oz (80.8 kg)     Objective:  Physical Exam Vitals and nursing note reviewed.  Constitutional:      Appearance: Normal appearance.  HENT:     Head: Normocephalic and atraumatic.  Eyes:     Extraocular Movements: Extraocular movements intact.  Cardiovascular:     Rate and Rhythm: Normal rate and regular rhythm.     Heart sounds: Normal heart sounds.  Pulmonary:     Effort: Pulmonary effort is normal.     Breath sounds: Normal breath sounds.  Musculoskeletal:        General: Tenderness present.     Cervical back: Normal range of motion.   Skin:    General: Skin is warm.  Neurological:     General: No focal deficit present.     Mental Status: He is alert.  Psychiatric:        Mood and Affect: Mood normal.         Assessment And Plan:  Hypertensive heart and renal disease with renal failure, stage 1 through stage 4 or unspecified chronic kidney disease, with heart failure (HCC) Assessment & Plan: Chronic, fair control. Goal BP<130/80.  He is encouraged to follow a low sodium diet. He will f/u in four to six months for re-evaluation. Fluid retention managed with Lasix  and spironolactone . Blood pressure improved with spironolactone , valsartan , and amlodipine . - Continue Lasix  and spironolactone . - Continue valsartan  and amlodipine .   Stage 3a chronic kidney disease (HCC) Assessment & Plan: Chronic, he is encouraged to stay well hydrated, avoid NSAIDs and keep BP controlled to prevent progression of CKD.     Atherosclerosis of aorta Assessment & Plan: Chronic, encouraged to follow heart healthy lifestyle. Clean eating, regular exercise and stress management are all a part of this lifestyle.  - He will continue with atorvastatin  with M-F dosing.      PVC (premature ventricular contraction) Assessment & Plan: Irregular heartbeat. Declined heart monitor due to procedural concerns. No recent monitoring. - Discussed heart monitor to assess heart activity.  Orders: -     TSH  Other abnormal glucose Assessment & Plan: Previous labs reviewed, his A1c has been elevated in the past. I will check an A1c today. Reminded to avoid refined sugars including sugary drinks/foods and processed meats including bacon, sausages and deli meats.    Orders: -     Hemoglobin A1c  Foraminal stenosis of lumbar region Assessment & Plan: Chronic left-sided low back pain. CT showed bulging discs. Prefers non-surgical management. -  Referred to [facility] (per his request) for further evaluation and management.  Orders: -      Ambulatory referral to Orthopedic Surgery  Drug therapy -     Vitamin B12  Other orders -     Famotidine ; Take 1 tablet (20 mg total) by mouth daily.  Dispense: 90 tablet; Refill: 1   Assessment & Plan   Return in 6 months (on 09/27/2024), or bp check.  Patient was given opportunity to ask questions. Patient verbalized understanding of the plan and was able to repeat key elements of the plan. All questions were answered to their satisfaction.   I, Lawrence LOISE Slocumb, MD, have reviewed all documentation for this visit. The documentation on 03/30/24 for the exam, diagnosis, procedures, and orders are all accurate and complete.   IF YOU HAVE BEEN REFERRED TO A SPECIALIST, IT MAY TAKE 1-2 WEEKS TO SCHEDULE/PROCESS THE REFERRAL. IF YOU HAVE NOT HEARD FROM US /SPECIALIST IN TWO WEEKS, PLEASE GIVE US  A CALL AT [phone].

## 2024-03-30 NOTE — Patient Instructions (Signed)
 Hypertension, Adult Hypertension is another name for high blood pressure. High blood pressure forces your heart to work harder to pump blood. This can cause problems over time. There are two numbers in a blood pressure reading. There is a top number (systolic) over a bottom number (diastolic). It is best to have a blood pressure that is below 120/80. What are the causes? The cause of this condition is not known. Some other conditions can lead to high blood pressure. What increases the risk? Some lifestyle factors can make you more likely to develop high blood pressure: Smoking. Not getting enough exercise or physical activity. Being overweight. Having too much fat, sugar, calories, or salt (sodium) in your diet. Drinking too much alcohol. Other risk factors include: Having any of these conditions: Heart disease. Diabetes. High cholesterol. Kidney disease. Obstructive sleep apnea. Having a family history of high blood pressure and high cholesterol. Age. The risk increases with age. Stress. What are the signs or symptoms? High blood pressure may not cause symptoms. Very high blood pressure (hypertensive crisis) may cause: Headache. Fast or uneven heartbeats (palpitations). Shortness of breath. Nosebleed. Vomiting or feeling like you may vomit (nauseous). Changes in how you see. Very bad chest pain. Feeling dizzy. Seizures. How is this treated? This condition is treated by making healthy lifestyle changes, such as: Eating healthy foods. Exercising more. Drinking less alcohol. Your doctor may prescribe medicine if lifestyle changes do not help enough and if: Your top number is above 130. Your bottom number is above 80. Your personal target blood pressure may vary. Follow these instructions at home: Eating and drinking  If told, follow the DASH eating plan. To follow this plan: Fill one half of your plate at each meal with fruits and vegetables. Fill one fourth of your plate  at each meal with whole grains. Whole grains include whole-wheat pasta, brown rice, and whole-grain bread. Eat or drink low-fat dairy products, such as skim milk or low-fat yogurt. Fill one fourth of your plate at each meal with low-fat (lean) proteins. Low-fat proteins include fish, chicken without skin, eggs, beans, and tofu. Avoid fatty meat, cured and processed meat, or chicken with skin. Avoid pre-made or processed food. Limit the amount of salt in your diet to less than 1,500 mg each day. Do not drink alcohol if: Your doctor tells you not to drink. You are pregnant, may be pregnant, or are planning to become pregnant. If you drink alcohol: Limit how much you have to: 0-1 drink a day for women. 0-2 drinks a day for men. Know how much alcohol is in your drink. In the [place] one drink equals one 12 oz bottle of beer (355 mL), one 5 oz glass of wine (148 mL), or one 1 oz glass of hard liquor (44 mL). Lifestyle  Work with your doctor to stay at a healthy weight or to lose weight. Ask your doctor what the best weight is for you. Get at least 30 minutes of exercise that causes your heart to beat faster (aerobic exercise) most days of the week. This may include walking, swimming, or biking. Get at least 30 minutes of exercise that strengthens your muscles (resistance exercise) at least 3 days a week. This may include lifting weights or doing Pilates. Do not smoke or use any products that contain nicotine or tobacco. If you need help quitting, ask your doctor. Check your blood pressure at home as told by your doctor. Keep all follow-up visits. Medicines Take over-the-counter and prescription medicines  only as told by your doctor. Follow directions carefully. Do not skip doses of blood pressure medicine. The medicine does not work as well if you skip doses. Skipping doses also puts you at risk for problems. Ask your doctor about side effects or reactions to medicines that you should watch  for. Contact a doctor if: You think you are having a reaction to the medicine you are taking. You have headaches that keep coming back. You feel dizzy. You have swelling in your ankles. You have trouble with your vision. Get help right away if: You get a very bad headache. You start to feel mixed up (confused). You feel weak or numb. You feel faint. You have very bad pain in your: Chest. Belly (abdomen). You vomit more than once. You have trouble breathing. These symptoms may be an emergency. Get help right away. Call 911. Do not wait to see if the symptoms will go away. Do not drive yourself to the hospital. Summary Hypertension is another name for high blood pressure. High blood pressure forces your heart to work harder to pump blood. For most people, a normal blood pressure is less than 120/80. Making healthy choices can help lower blood pressure. If your blood pressure does not get lower with healthy choices, you may need to take medicine. This information is not intended to replace advice given to you by your health care provider. Make sure you discuss any questions you have with your health care provider. Document Revised: 02/27/2021 Document Reviewed: 02/27/2021 Elsevier Patient Education  2024 [employer].

## 2024-03-31 LAB — HEMOGLOBIN A1C
Est. average glucose Bld gHb Est-mCnc: 114 mg/dL
Hgb A1c MFr Bld: 5.6 % (ref 4.8–5.6)

## 2024-03-31 LAB — VITAMIN B12: Vitamin B-12: 583 pg/mL (ref 232–1245)

## 2024-03-31 LAB — TSH: TSH: 1.06 u[IU]/mL (ref 0.450–4.500)

## 2024-04-02 ENCOUNTER — Ambulatory Visit: Payer: Self-pay | Admitting: Internal Medicine

## 2024-04-04 DIAGNOSIS — M48061 Spinal stenosis, lumbar region without neurogenic claudication: Secondary | ICD-10-CM | POA: Insufficient documentation

## 2024-04-04 DIAGNOSIS — I493 Ventricular premature depolarization: Secondary | ICD-10-CM | POA: Insufficient documentation

## 2024-04-04 NOTE — Assessment & Plan Note (Signed)
 Chronic, fair control. Goal BP<130/80.  He is encouraged to follow a low sodium diet. He will f/u in four to six months for re-evaluation. Fluid retention managed with Lasix  and spironolactone . Blood pressure improved with spironolactone , valsartan , and amlodipine . - Continue Lasix  and spironolactone . - Continue valsartan  and amlodipine .

## 2024-04-04 NOTE — Assessment & Plan Note (Signed)
 Previous labs reviewed, his A1c has been elevated in the past. I will check an A1c today. Reminded to avoid refined sugars including sugary drinks/foods and processed meats including bacon, sausages and deli meats.

## 2024-04-04 NOTE — Assessment & Plan Note (Signed)
 Chronic, he is encouraged to stay well hydrated, avoid NSAIDs and keep BP controlled to prevent progression of CKD.

## 2024-04-04 NOTE — Assessment & Plan Note (Signed)
 Chronic left-sided low back pain. CT showed bulging discs. Prefers non-surgical management. - Referred to [facility] (per his request) for further evaluation and management.

## 2024-04-04 NOTE — Assessment & Plan Note (Signed)
 Irregular heartbeat. Declined heart monitor due to procedural concerns. No recent monitoring. - Discussed heart monitor to assess heart activity.

## 2024-04-04 NOTE — Assessment & Plan Note (Signed)
 Chronic, encouraged to follow heart healthy lifestyle. Clean eating, regular exercise and stress management are all a part of this lifestyle.  - He will continue with atorvastatin  with M-F dosing.

## 2024-04-07 ENCOUNTER — Other Ambulatory Visit: Payer: Self-pay | Admitting: Internal Medicine

## 2024-04-10 ENCOUNTER — Ambulatory Visit: Admitting: Internal Medicine

## 2024-04-10 MED ORDER — VALSARTAN 40 MG PO TABS: 40.0000 mg | ORAL_TABLET | Freq: Every day | ORAL | 3 refills | Status: AC

## 2024-04-13 ENCOUNTER — Other Ambulatory Visit: Payer: Self-pay | Admitting: Cardiovascular Disease

## 2024-04-13 DIAGNOSIS — I480 Paroxysmal atrial fibrillation: Secondary | ICD-10-CM

## 2024-04-29 ENCOUNTER — Other Ambulatory Visit: Payer: Self-pay | Admitting: Internal Medicine

## 2024-05-08 ENCOUNTER — Ambulatory Visit (HOSPITAL_COMMUNITY)

## 2024-05-22 ENCOUNTER — Telehealth: Payer: Self-pay | Admitting: Internal Medicine

## 2024-05-22 NOTE — Telephone Encounter (Signed)
 [facility]  called to follow up on medical records request they mailed on 10/25. Please advise.   fax: [phone]

## 2024-06-29 ENCOUNTER — Inpatient Hospital Stay: Payer: Self-pay

## 2024-06-29 ENCOUNTER — Telehealth: Payer: Self-pay | Admitting: Oncology

## 2024-06-29 ENCOUNTER — Inpatient Hospital Stay: Payer: Self-pay | Admitting: Oncology

## 2024-06-29 NOTE — Telephone Encounter (Signed)
 PT's wife called stating that PT got sick on the way to his appt and messed his clothes up. PT's wife had to turn around and go back home. Rescheduled PT's appt day and time confirmed with wife.

## 2024-06-29 NOTE — Telephone Encounter (Signed)
 Called PT to reschedule appt; day and time confirmed.

## 2024-06-30 ENCOUNTER — Other Ambulatory Visit

## 2024-06-30 ENCOUNTER — Ambulatory Visit: Admitting: Oncology

## 2024-06-30 ENCOUNTER — Inpatient Hospital Stay: Payer: Self-pay

## 2024-06-30 ENCOUNTER — Inpatient Hospital Stay: Payer: Self-pay | Admitting: Oncology

## 2024-07-03 ENCOUNTER — Inpatient Hospital Stay: Payer: Self-pay | Admitting: Oncology

## 2024-07-03 ENCOUNTER — Inpatient Hospital Stay: Payer: Self-pay

## 2024-09-27 ENCOUNTER — Ambulatory Visit: Admitting: Internal Medicine

## 2025-01-10 ENCOUNTER — Ambulatory Visit: Payer: Self-pay
